# Patient Record
Sex: Female | Born: 1950 | Race: Black or African American | Hispanic: No | Smoking: Current every day smoker
Health system: Southern US, Community
[De-identification: ages and names within clinical notes are randomized; demographics above are authoritative.]

## PROBLEM LIST (undated history)

## (undated) DIAGNOSIS — I509 Heart failure, unspecified: Secondary | ICD-10-CM

## (undated) DIAGNOSIS — N951 Menopausal and female climacteric states: Secondary | ICD-10-CM

## (undated) DIAGNOSIS — R209 Unspecified disturbances of skin sensation: Secondary | ICD-10-CM

## (undated) DIAGNOSIS — K760 Fatty (change of) liver, not elsewhere classified: Secondary | ICD-10-CM

## (undated) DIAGNOSIS — I10 Essential (primary) hypertension: Secondary | ICD-10-CM

## (undated) DIAGNOSIS — T7840XA Allergy, unspecified, initial encounter: Secondary | ICD-10-CM

## (undated) DIAGNOSIS — J309 Allergic rhinitis, unspecified: Secondary | ICD-10-CM

## (undated) DIAGNOSIS — E785 Hyperlipidemia, unspecified: Secondary | ICD-10-CM

## (undated) DIAGNOSIS — Z0000 Encounter for general adult medical examination without abnormal findings: Secondary | ICD-10-CM

## (undated) DIAGNOSIS — K219 Gastro-esophageal reflux disease without esophagitis: Secondary | ICD-10-CM

## (undated) DIAGNOSIS — L989 Disorder of the skin and subcutaneous tissue, unspecified: Secondary | ICD-10-CM

## (undated) DIAGNOSIS — K649 Unspecified hemorrhoids: Secondary | ICD-10-CM

## (undated) DIAGNOSIS — Z124 Encounter for screening for malignant neoplasm of cervix: Secondary | ICD-10-CM

## (undated) DIAGNOSIS — I471 Supraventricular tachycardia, unspecified: Secondary | ICD-10-CM

## (undated) DIAGNOSIS — F419 Anxiety disorder, unspecified: Secondary | ICD-10-CM

## (undated) DIAGNOSIS — K635 Polyp of colon: Secondary | ICD-10-CM

## (undated) DIAGNOSIS — R531 Weakness: Secondary | ICD-10-CM

## (undated) DIAGNOSIS — R002 Palpitations: Secondary | ICD-10-CM

## (undated) DIAGNOSIS — E119 Type 2 diabetes mellitus without complications: Secondary | ICD-10-CM

## (undated) DIAGNOSIS — Z720 Tobacco use: Secondary | ICD-10-CM

## (undated) DIAGNOSIS — J45909 Unspecified asthma, uncomplicated: Secondary | ICD-10-CM

## (undated) DIAGNOSIS — G4700 Insomnia, unspecified: Secondary | ICD-10-CM

## (undated) HISTORY — DX: Allergy, unspecified, initial encounter: T78.40XA

## (undated) HISTORY — DX: Encounter for general adult medical examination without abnormal findings: Z00.00

## (undated) HISTORY — DX: Gastro-esophageal reflux disease without esophagitis: K21.9

## (undated) HISTORY — DX: Polyp of colon: K63.5

## (undated) HISTORY — PX: CATARACT EXTRACTION, BILATERAL: SHX1313

## (undated) HISTORY — DX: Anxiety disorder, unspecified: F41.9

## (undated) HISTORY — DX: Supraventricular tachycardia, unspecified: I47.10

## (undated) HISTORY — DX: Weakness: R53.1

## (undated) HISTORY — DX: Palpitations: R00.2

## (undated) HISTORY — PX: OTHER SURGICAL HISTORY: SHX169

## (undated) HISTORY — DX: Fatty (change of) liver, not elsewhere classified: K76.0

## (undated) HISTORY — PX: APPENDECTOMY: SHX54

## (undated) HISTORY — DX: Insomnia, unspecified: G47.00

## (undated) HISTORY — DX: Tobacco use: Z72.0

## (undated) HISTORY — DX: Menopausal and female climacteric states: N95.1

## (undated) HISTORY — DX: Allergic rhinitis, unspecified: J30.9

## (undated) HISTORY — DX: Hyperlipidemia, unspecified: E78.5

## (undated) HISTORY — DX: Type 2 diabetes mellitus without complications: E11.9

## (undated) HISTORY — DX: Unspecified hemorrhoids: K64.9

## (undated) HISTORY — DX: Disorder of the skin and subcutaneous tissue, unspecified: L98.9

## (undated) HISTORY — DX: Unspecified disturbances of skin sensation: R20.9

## (undated) HISTORY — DX: Encounter for screening for malignant neoplasm of cervix: Z12.4

## (undated) HISTORY — DX: Essential (primary) hypertension: I10

## (undated) HISTORY — DX: Supraventricular tachycardia: I47.1

---

## 1988-01-13 HISTORY — PX: ABDOMINAL HYSTERECTOMY: SHX81

## 1997-06-21 ENCOUNTER — Emergency Department (HOSPITAL_COMMUNITY): Admission: EM | Admit: 1997-06-21 | Discharge: 1997-06-21 | Payer: Self-pay | Admitting: Emergency Medicine

## 1997-12-31 ENCOUNTER — Ambulatory Visit (HOSPITAL_COMMUNITY): Admission: RE | Admit: 1997-12-31 | Discharge: 1997-12-31 | Payer: Self-pay | Admitting: Family Medicine

## 1998-08-03 ENCOUNTER — Emergency Department (HOSPITAL_COMMUNITY): Admission: EM | Admit: 1998-08-03 | Discharge: 1998-08-03 | Payer: Self-pay | Admitting: Emergency Medicine

## 1998-10-23 ENCOUNTER — Other Ambulatory Visit: Admission: RE | Admit: 1998-10-23 | Discharge: 1998-10-23 | Payer: Self-pay | Admitting: *Deleted

## 1998-10-23 ENCOUNTER — Other Ambulatory Visit: Admission: RE | Admit: 1998-10-23 | Discharge: 1998-10-23 | Payer: Self-pay | Admitting: Radiology

## 2000-05-13 ENCOUNTER — Other Ambulatory Visit: Admission: RE | Admit: 2000-05-13 | Discharge: 2000-05-13 | Payer: Self-pay | Admitting: *Deleted

## 2000-05-26 ENCOUNTER — Encounter: Payer: Self-pay | Admitting: Emergency Medicine

## 2000-05-26 ENCOUNTER — Emergency Department (HOSPITAL_COMMUNITY): Admission: EM | Admit: 2000-05-26 | Discharge: 2000-05-26 | Payer: Self-pay | Admitting: Emergency Medicine

## 2000-08-12 ENCOUNTER — Emergency Department (HOSPITAL_COMMUNITY): Admission: EM | Admit: 2000-08-12 | Discharge: 2000-08-12 | Payer: Self-pay

## 2000-08-18 ENCOUNTER — Encounter: Payer: Self-pay | Admitting: Internal Medicine

## 2000-08-18 ENCOUNTER — Ambulatory Visit (HOSPITAL_COMMUNITY): Admission: RE | Admit: 2000-08-18 | Discharge: 2000-08-18 | Payer: Self-pay | Admitting: Internal Medicine

## 2000-10-18 ENCOUNTER — Emergency Department (HOSPITAL_COMMUNITY): Admission: EM | Admit: 2000-10-18 | Discharge: 2000-10-19 | Payer: Self-pay | Admitting: Emergency Medicine

## 2000-10-19 ENCOUNTER — Encounter: Payer: Self-pay | Admitting: Emergency Medicine

## 2001-08-18 ENCOUNTER — Emergency Department (HOSPITAL_COMMUNITY): Admission: EM | Admit: 2001-08-18 | Discharge: 2001-08-18 | Payer: Self-pay | Admitting: Emergency Medicine

## 2002-01-12 HISTORY — PX: SIGMOIDOSCOPY: SUR1295

## 2002-01-15 ENCOUNTER — Emergency Department (HOSPITAL_COMMUNITY): Admission: EM | Admit: 2002-01-15 | Discharge: 2002-01-15 | Payer: Self-pay | Admitting: Emergency Medicine

## 2002-05-11 ENCOUNTER — Encounter: Payer: Self-pay | Admitting: Emergency Medicine

## 2002-05-11 ENCOUNTER — Emergency Department (HOSPITAL_COMMUNITY): Admission: EM | Admit: 2002-05-11 | Discharge: 2002-05-12 | Payer: Self-pay | Admitting: Emergency Medicine

## 2002-05-15 ENCOUNTER — Emergency Department (HOSPITAL_COMMUNITY): Admission: EM | Admit: 2002-05-15 | Discharge: 2002-05-15 | Payer: Self-pay | Admitting: Emergency Medicine

## 2002-05-15 ENCOUNTER — Encounter: Payer: Self-pay | Admitting: Emergency Medicine

## 2002-05-17 ENCOUNTER — Ambulatory Visit (HOSPITAL_COMMUNITY): Admission: RE | Admit: 2002-05-17 | Discharge: 2002-05-17 | Payer: Self-pay | Admitting: Internal Medicine

## 2002-06-23 ENCOUNTER — Encounter: Admission: RE | Admit: 2002-06-23 | Discharge: 2002-06-23 | Payer: Self-pay | Admitting: Internal Medicine

## 2002-06-23 ENCOUNTER — Encounter: Payer: Self-pay | Admitting: Internal Medicine

## 2002-06-25 ENCOUNTER — Emergency Department (HOSPITAL_COMMUNITY): Admission: EM | Admit: 2002-06-25 | Discharge: 2002-06-25 | Payer: Self-pay | Admitting: Emergency Medicine

## 2002-08-14 ENCOUNTER — Emergency Department (HOSPITAL_COMMUNITY): Admission: EM | Admit: 2002-08-14 | Discharge: 2002-08-14 | Payer: Self-pay | Admitting: Emergency Medicine

## 2003-07-31 ENCOUNTER — Emergency Department (HOSPITAL_COMMUNITY): Admission: EM | Admit: 2003-07-31 | Discharge: 2003-07-31 | Payer: Self-pay | Admitting: Emergency Medicine

## 2003-11-17 ENCOUNTER — Ambulatory Visit: Payer: Self-pay | Admitting: Family Medicine

## 2004-01-02 ENCOUNTER — Ambulatory Visit: Payer: Self-pay | Admitting: Internal Medicine

## 2004-03-24 ENCOUNTER — Ambulatory Visit: Payer: Self-pay | Admitting: Internal Medicine

## 2004-07-21 ENCOUNTER — Ambulatory Visit: Payer: Self-pay | Admitting: Internal Medicine

## 2004-07-28 ENCOUNTER — Ambulatory Visit: Payer: Self-pay | Admitting: Internal Medicine

## 2004-08-28 ENCOUNTER — Ambulatory Visit: Payer: Self-pay | Admitting: Internal Medicine

## 2004-09-22 ENCOUNTER — Ambulatory Visit: Payer: Self-pay | Admitting: Internal Medicine

## 2004-10-01 ENCOUNTER — Ambulatory Visit: Payer: Self-pay

## 2004-12-01 ENCOUNTER — Ambulatory Visit: Payer: Self-pay | Admitting: Internal Medicine

## 2005-02-12 ENCOUNTER — Ambulatory Visit: Payer: Self-pay | Admitting: Internal Medicine

## 2005-05-18 ENCOUNTER — Ambulatory Visit: Payer: Self-pay | Admitting: Internal Medicine

## 2005-07-02 ENCOUNTER — Ambulatory Visit: Payer: Self-pay | Admitting: Internal Medicine

## 2005-07-24 ENCOUNTER — Ambulatory Visit: Payer: Self-pay | Admitting: Internal Medicine

## 2005-07-31 ENCOUNTER — Ambulatory Visit: Payer: Self-pay | Admitting: Internal Medicine

## 2005-09-09 ENCOUNTER — Ambulatory Visit: Payer: Self-pay | Admitting: Internal Medicine

## 2005-11-16 ENCOUNTER — Encounter: Payer: Self-pay | Admitting: Internal Medicine

## 2005-11-25 ENCOUNTER — Ambulatory Visit: Payer: Self-pay | Admitting: Internal Medicine

## 2006-02-08 ENCOUNTER — Ambulatory Visit: Payer: Self-pay | Admitting: Internal Medicine

## 2006-04-19 ENCOUNTER — Ambulatory Visit: Payer: Self-pay | Admitting: Internal Medicine

## 2006-07-21 DIAGNOSIS — K219 Gastro-esophageal reflux disease without esophagitis: Secondary | ICD-10-CM | POA: Insufficient documentation

## 2006-07-21 DIAGNOSIS — J309 Allergic rhinitis, unspecified: Secondary | ICD-10-CM | POA: Insufficient documentation

## 2006-07-21 DIAGNOSIS — N951 Menopausal and female climacteric states: Secondary | ICD-10-CM | POA: Insufficient documentation

## 2006-08-05 ENCOUNTER — Ambulatory Visit: Payer: Self-pay | Admitting: Internal Medicine

## 2006-08-05 LAB — CONVERTED CEMR LAB
ALT: 25 units/L (ref 0–35)
AST: 25 units/L (ref 0–37)
Albumin: 3.8 g/dL (ref 3.5–5.2)
Basophils Absolute: 0.1 10*3/uL (ref 0.0–0.1)
Bilirubin Urine: NEGATIVE
Bilirubin, Direct: 0.1 mg/dL (ref 0.0–0.3)
CO2: 31 meq/L (ref 19–32)
Creatinine, Ser: 0.5 mg/dL (ref 0.4–1.2)
Eosinophils Absolute: 0.2 10*3/uL (ref 0.0–0.6)
GFR calc Af Amer: 165 mL/min
GFR calc non Af Amer: 137 mL/min
Glucose, Urine, Semiquant: NEGATIVE
HDL: 36.6 mg/dL — ABNORMAL LOW (ref >39.0)
Lymphocytes Relative: 48.4 % — ABNORMAL HIGH (ref 12.0–46.0)
MCV: 95.4 fL (ref 78.0–100.0)
Monocytes Absolute: 0.5 10*3/uL (ref 0.2–0.7)
Monocytes Relative: 6.6 % (ref 3.0–11.0)
Potassium: 3.7 meq/L (ref 3.5–5.1)
RBC: 4.13 M/uL (ref 3.87–5.11)
Sodium: 143 meq/L (ref 135–145)
Total Bilirubin: 0.8 mg/dL (ref 0.3–1.2)
Total Protein: 6.5 g/dL (ref 6.0–8.3)
Triglycerides: 116 mg/dL (ref 0–149)
Urobilinogen, UA: 1
VLDL: 23 mg/dL (ref 0–40)
WBC Urine, dipstick: NEGATIVE
pH: 6

## 2006-08-12 ENCOUNTER — Ambulatory Visit: Payer: Self-pay | Admitting: Internal Medicine

## 2006-10-01 ENCOUNTER — Ambulatory Visit: Payer: Self-pay | Admitting: Internal Medicine

## 2006-12-31 ENCOUNTER — Ambulatory Visit: Payer: Self-pay | Admitting: Internal Medicine

## 2007-01-03 ENCOUNTER — Telehealth (INDEPENDENT_AMBULATORY_CARE_PROVIDER_SITE_OTHER): Payer: Self-pay | Admitting: *Deleted

## 2007-01-13 HISTORY — PX: COLONOSCOPY: SHX174

## 2007-02-04 ENCOUNTER — Ambulatory Visit: Payer: Self-pay | Admitting: Internal Medicine

## 2007-02-04 ENCOUNTER — Ambulatory Visit: Payer: Self-pay | Admitting: Cardiology

## 2007-02-04 ENCOUNTER — Inpatient Hospital Stay (HOSPITAL_COMMUNITY): Admission: EM | Admit: 2007-02-04 | Discharge: 2007-02-05 | Payer: Self-pay | Admitting: Emergency Medicine

## 2007-02-12 ENCOUNTER — Emergency Department (HOSPITAL_COMMUNITY): Admission: EM | Admit: 2007-02-12 | Discharge: 2007-02-12 | Payer: Self-pay | Admitting: Emergency Medicine

## 2007-02-17 ENCOUNTER — Ambulatory Visit: Payer: Self-pay | Admitting: Internal Medicine

## 2007-02-17 DIAGNOSIS — I700 Atherosclerosis of aorta: Secondary | ICD-10-CM | POA: Insufficient documentation

## 2007-02-17 LAB — CONVERTED CEMR LAB: VLDL: 31 mg/dL (ref 0–40)

## 2007-02-24 ENCOUNTER — Emergency Department (HOSPITAL_COMMUNITY): Admission: EM | Admit: 2007-02-24 | Discharge: 2007-02-24 | Payer: Self-pay | Admitting: Emergency Medicine

## 2007-03-03 ENCOUNTER — Ambulatory Visit: Payer: Self-pay | Admitting: Internal Medicine

## 2007-03-21 ENCOUNTER — Telehealth: Payer: Self-pay | Admitting: Internal Medicine

## 2007-04-21 ENCOUNTER — Telehealth: Payer: Self-pay | Admitting: Internal Medicine

## 2007-04-29 ENCOUNTER — Ambulatory Visit: Payer: Self-pay | Admitting: Internal Medicine

## 2007-05-06 ENCOUNTER — Ambulatory Visit: Payer: Self-pay | Admitting: Family Medicine

## 2007-05-10 ENCOUNTER — Ambulatory Visit: Payer: Self-pay | Admitting: Internal Medicine

## 2007-05-10 ENCOUNTER — Encounter: Payer: Self-pay | Admitting: Internal Medicine

## 2007-05-11 ENCOUNTER — Telehealth: Payer: Self-pay | Admitting: Internal Medicine

## 2007-05-31 ENCOUNTER — Ambulatory Visit: Payer: Self-pay | Admitting: Family Medicine

## 2007-06-08 ENCOUNTER — Emergency Department (HOSPITAL_COMMUNITY): Admission: EM | Admit: 2007-06-08 | Discharge: 2007-06-08 | Payer: Self-pay | Admitting: Emergency Medicine

## 2007-06-13 ENCOUNTER — Telehealth: Payer: Self-pay | Admitting: Internal Medicine

## 2007-07-04 ENCOUNTER — Ambulatory Visit: Payer: Self-pay | Admitting: Internal Medicine

## 2007-07-18 ENCOUNTER — Ambulatory Visit: Payer: Self-pay | Admitting: Internal Medicine

## 2007-07-18 LAB — CONVERTED CEMR LAB
BUN: 6 mg/dL (ref 6–23)
Basophils Relative: 1.1 % — ABNORMAL HIGH (ref 0.0–1.0)
Bilirubin, Direct: 0.1 mg/dL (ref 0.0–0.3)
Calcium: 9.3 mg/dL (ref 8.4–10.5)
Creatinine, Ser: 0.6 mg/dL (ref 0.4–1.2)
Eosinophils Absolute: 0.1 10*3/uL (ref 0.0–0.7)
Eosinophils Relative: 1.2 % (ref 0.0–5.0)
GFR calc Af Amer: 133 mL/min
Glucose, Bld: 134 mg/dL — ABNORMAL HIGH (ref 70–99)
Lymphocytes Relative: 57.8 % — ABNORMAL HIGH (ref 12.0–46.0)
MCHC: 34.1 g/dL (ref 30.0–36.0)
MCV: 96.1 fL (ref 78.0–100.0)
Monocytes Relative: 6.3 % (ref 3.0–12.0)
Neutro Abs: 2.4 10*3/uL (ref 1.4–7.7)
Neutrophils Relative %: 33.6 % — ABNORMAL LOW (ref 43.0–77.0)
Platelets: 281 10*3/uL (ref 150–400)
RBC: 4.43 M/uL (ref 3.87–5.11)
RDW: 13.1 % (ref 11.5–14.6)
Total CHOL/HDL Ratio: 5.5
WBC: 7.2 10*3/uL (ref 4.5–10.5)

## 2007-07-21 ENCOUNTER — Ambulatory Visit: Payer: Self-pay | Admitting: Internal Medicine

## 2007-07-24 ENCOUNTER — Emergency Department (HOSPITAL_COMMUNITY): Admission: EM | Admit: 2007-07-24 | Discharge: 2007-07-24 | Payer: Self-pay | Admitting: Emergency Medicine

## 2007-08-01 ENCOUNTER — Emergency Department (HOSPITAL_COMMUNITY): Admission: EM | Admit: 2007-08-01 | Discharge: 2007-08-01 | Payer: Self-pay | Admitting: Emergency Medicine

## 2007-08-16 ENCOUNTER — Telehealth: Payer: Self-pay | Admitting: Internal Medicine

## 2007-08-16 ENCOUNTER — Emergency Department (HOSPITAL_COMMUNITY): Admission: EM | Admit: 2007-08-16 | Discharge: 2007-08-16 | Payer: Self-pay | Admitting: Emergency Medicine

## 2007-08-18 ENCOUNTER — Ambulatory Visit: Payer: Self-pay | Admitting: Internal Medicine

## 2007-08-18 DIAGNOSIS — E1149 Type 2 diabetes mellitus with other diabetic neurological complication: Secondary | ICD-10-CM | POA: Insufficient documentation

## 2007-08-18 DIAGNOSIS — Z8679 Personal history of other diseases of the circulatory system: Secondary | ICD-10-CM | POA: Insufficient documentation

## 2007-08-18 DIAGNOSIS — E119 Type 2 diabetes mellitus without complications: Secondary | ICD-10-CM | POA: Insufficient documentation

## 2007-08-19 ENCOUNTER — Telehealth: Payer: Self-pay | Admitting: Internal Medicine

## 2007-08-22 ENCOUNTER — Telehealth: Payer: Self-pay | Admitting: *Deleted

## 2007-08-30 ENCOUNTER — Ambulatory Visit: Payer: Self-pay | Admitting: Internal Medicine

## 2007-08-31 ENCOUNTER — Encounter: Payer: Self-pay | Admitting: Internal Medicine

## 2007-09-07 ENCOUNTER — Telehealth: Payer: Self-pay | Admitting: Internal Medicine

## 2007-09-07 ENCOUNTER — Encounter: Admission: RE | Admit: 2007-09-07 | Discharge: 2007-12-06 | Payer: Self-pay | Admitting: Internal Medicine

## 2007-09-08 ENCOUNTER — Encounter: Payer: Self-pay | Admitting: Internal Medicine

## 2007-09-09 ENCOUNTER — Ambulatory Visit: Payer: Self-pay | Admitting: Internal Medicine

## 2007-09-09 LAB — CONVERTED CEMR LAB
Basophils Absolute: 0.1 10*3/uL (ref 0.0–0.1)
CO2: 29 meq/L (ref 19–32)
Calcium: 9.6 mg/dL (ref 8.4–10.5)
Chloride: 105 meq/L (ref 96–112)
Creatinine, Ser: 0.6 mg/dL (ref 0.4–1.2)
Eosinophils Absolute: 0.1 10*3/uL (ref 0.0–0.7)
GFR calc non Af Amer: 110 mL/min
HCT: 42 % (ref 36.0–46.0)
Hemoglobin: 14.8 g/dL (ref 12.0–15.0)
Lymphocytes Relative: 57.8 % — ABNORMAL HIGH (ref 12.0–46.0)
MCHC: 35.1 g/dL (ref 30.0–36.0)
Platelets: 295 10*3/uL (ref 150–400)
Potassium: 3.5 meq/L (ref 3.5–5.1)
Prothrombin Time: 12 s (ref 10.9–13.3)
RBC: 4.48 M/uL (ref 3.87–5.11)
Sodium: 140 meq/L (ref 135–145)
WBC: 7 10*3/uL (ref 4.5–10.5)
aPTT: 27.7 s (ref 21.7–29.8)

## 2007-09-13 HISTORY — PX: OTHER SURGICAL HISTORY: SHX169

## 2007-09-20 ENCOUNTER — Ambulatory Visit: Payer: Self-pay | Admitting: Cardiology

## 2007-09-20 ENCOUNTER — Emergency Department (HOSPITAL_COMMUNITY): Admission: EM | Admit: 2007-09-20 | Discharge: 2007-09-21 | Payer: Self-pay | Admitting: Emergency Medicine

## 2007-09-21 ENCOUNTER — Ambulatory Visit (HOSPITAL_COMMUNITY): Admission: RE | Admit: 2007-09-21 | Discharge: 2007-09-22 | Payer: Self-pay | Admitting: Internal Medicine

## 2007-09-27 ENCOUNTER — Ambulatory Visit: Payer: Self-pay

## 2007-09-27 ENCOUNTER — Emergency Department (HOSPITAL_COMMUNITY): Admission: EM | Admit: 2007-09-27 | Discharge: 2007-09-28 | Payer: Self-pay | Admitting: Emergency Medicine

## 2007-09-27 ENCOUNTER — Emergency Department (HOSPITAL_COMMUNITY): Admission: EM | Admit: 2007-09-27 | Discharge: 2007-09-27 | Payer: Self-pay | Admitting: Emergency Medicine

## 2007-09-27 ENCOUNTER — Encounter: Payer: Self-pay | Admitting: Internal Medicine

## 2007-09-28 ENCOUNTER — Telehealth: Payer: Self-pay | Admitting: Internal Medicine

## 2007-10-01 ENCOUNTER — Emergency Department: Payer: Self-pay | Admitting: Unknown Physician Specialty

## 2007-10-03 ENCOUNTER — Ambulatory Visit: Payer: Self-pay | Admitting: Internal Medicine

## 2007-10-03 DIAGNOSIS — F4310 Post-traumatic stress disorder, unspecified: Secondary | ICD-10-CM | POA: Insufficient documentation

## 2007-10-04 ENCOUNTER — Telehealth: Payer: Self-pay | Admitting: Internal Medicine

## 2007-10-06 ENCOUNTER — Encounter: Payer: Self-pay | Admitting: Family Medicine

## 2007-10-15 ENCOUNTER — Emergency Department (HOSPITAL_COMMUNITY): Admission: EM | Admit: 2007-10-15 | Discharge: 2007-10-15 | Payer: Self-pay | Admitting: Emergency Medicine

## 2007-10-18 ENCOUNTER — Ambulatory Visit: Payer: Self-pay | Admitting: Internal Medicine

## 2007-10-18 DIAGNOSIS — I10 Essential (primary) hypertension: Secondary | ICD-10-CM | POA: Insufficient documentation

## 2007-10-18 LAB — CONVERTED CEMR LAB: Hgb A1c MFr Bld: 6.6 % — ABNORMAL HIGH (ref 4.6–6.0)

## 2007-10-28 ENCOUNTER — Emergency Department (HOSPITAL_COMMUNITY): Admission: EM | Admit: 2007-10-28 | Discharge: 2007-10-28 | Payer: Self-pay | Admitting: Emergency Medicine

## 2007-11-02 ENCOUNTER — Ambulatory Visit: Payer: Self-pay | Admitting: Internal Medicine

## 2007-11-02 LAB — CONVERTED CEMR LAB
Chloride: 104 meq/L (ref 96–112)
GFR calc Af Amer: 112 mL/min
Sodium: 142 meq/L (ref 135–145)

## 2007-11-03 ENCOUNTER — Ambulatory Visit: Payer: Self-pay | Admitting: Family Medicine

## 2007-11-03 DIAGNOSIS — F17200 Nicotine dependence, unspecified, uncomplicated: Secondary | ICD-10-CM | POA: Insufficient documentation

## 2007-11-17 ENCOUNTER — Ambulatory Visit: Payer: Self-pay | Admitting: Internal Medicine

## 2007-11-29 ENCOUNTER — Emergency Department (HOSPITAL_COMMUNITY): Admission: EM | Admit: 2007-11-29 | Discharge: 2007-11-29 | Payer: Self-pay | Admitting: Family Medicine

## 2007-12-05 ENCOUNTER — Telehealth: Payer: Self-pay | Admitting: Internal Medicine

## 2007-12-05 ENCOUNTER — Ambulatory Visit: Payer: Self-pay | Admitting: Internal Medicine

## 2007-12-16 ENCOUNTER — Emergency Department (HOSPITAL_COMMUNITY): Admission: EM | Admit: 2007-12-16 | Discharge: 2007-12-16 | Payer: Self-pay | Admitting: Family Medicine

## 2007-12-23 ENCOUNTER — Ambulatory Visit: Payer: Self-pay | Admitting: Internal Medicine

## 2008-01-10 ENCOUNTER — Encounter: Payer: Self-pay | Admitting: Internal Medicine

## 2008-02-04 ENCOUNTER — Emergency Department (HOSPITAL_COMMUNITY): Admission: EM | Admit: 2008-02-04 | Discharge: 2008-02-04 | Payer: Self-pay | Admitting: Emergency Medicine

## 2008-02-08 ENCOUNTER — Telehealth: Payer: Self-pay | Admitting: *Deleted

## 2008-02-23 ENCOUNTER — Ambulatory Visit: Payer: Self-pay | Admitting: Family Medicine

## 2008-02-27 ENCOUNTER — Emergency Department (HOSPITAL_COMMUNITY): Admission: EM | Admit: 2008-02-27 | Discharge: 2008-02-27 | Payer: Self-pay | Admitting: Emergency Medicine

## 2008-03-01 ENCOUNTER — Encounter: Payer: Self-pay | Admitting: Family Medicine

## 2008-03-01 ENCOUNTER — Ambulatory Visit: Payer: Self-pay | Admitting: Family Medicine

## 2008-03-05 ENCOUNTER — Encounter: Payer: Self-pay | Admitting: Family Medicine

## 2008-03-05 LAB — CONVERTED CEMR LAB
BUN: 9 mg/dL (ref 6–23)
CO2: 25 meq/L (ref 19–32)
Glucose, Bld: 102 mg/dL — ABNORMAL HIGH (ref 70–99)
HCT: 41.4 % (ref 36.0–46.0)
MCHC: 34.8 g/dL (ref 30.0–36.0)
Sodium: 141 meq/L (ref 135–145)
Total CHOL/HDL Ratio: 5.2
Triglycerides: 138 mg/dL (ref <150)
VLDL: 28 mg/dL (ref 0–40)

## 2008-03-15 ENCOUNTER — Encounter: Payer: Self-pay | Admitting: Family Medicine

## 2008-03-15 ENCOUNTER — Ambulatory Visit: Payer: Self-pay | Admitting: Family Medicine

## 2008-03-15 LAB — CONVERTED CEMR LAB

## 2008-03-20 ENCOUNTER — Ambulatory Visit: Payer: Self-pay | Admitting: Family Medicine

## 2008-04-09 ENCOUNTER — Telehealth: Payer: Self-pay | Admitting: Internal Medicine

## 2008-04-09 ENCOUNTER — Encounter: Payer: Self-pay | Admitting: Internal Medicine

## 2008-04-10 ENCOUNTER — Telehealth: Payer: Self-pay | Admitting: Internal Medicine

## 2008-04-12 ENCOUNTER — Ambulatory Visit: Payer: Self-pay | Admitting: Family Medicine

## 2008-04-17 ENCOUNTER — Ambulatory Visit: Payer: Self-pay | Admitting: Family Medicine

## 2008-04-17 ENCOUNTER — Encounter: Payer: Self-pay | Admitting: Family Medicine

## 2008-04-21 ENCOUNTER — Emergency Department (HOSPITAL_COMMUNITY): Admission: EM | Admit: 2008-04-21 | Discharge: 2008-04-21 | Payer: Self-pay | Admitting: Emergency Medicine

## 2008-04-23 ENCOUNTER — Ambulatory Visit: Payer: Self-pay | Admitting: Family Medicine

## 2008-04-23 ENCOUNTER — Encounter: Payer: Self-pay | Admitting: Family Medicine

## 2008-04-23 LAB — CONVERTED CEMR LAB
Cholesterol: 217 mg/dL — ABNORMAL HIGH (ref 0–200)
LDL Cholesterol: 150 mg/dL — ABNORMAL HIGH (ref 0–99)
Triglycerides: 151 mg/dL — ABNORMAL HIGH (ref <150)
VLDL: 30 mg/dL (ref 0–40)

## 2008-04-25 ENCOUNTER — Encounter: Payer: Self-pay | Admitting: Family Medicine

## 2008-04-29 ENCOUNTER — Emergency Department (HOSPITAL_COMMUNITY): Admission: EM | Admit: 2008-04-29 | Discharge: 2008-04-29 | Payer: Self-pay | Admitting: Emergency Medicine

## 2008-05-03 ENCOUNTER — Ambulatory Visit: Payer: Self-pay | Admitting: Family Medicine

## 2008-05-07 ENCOUNTER — Encounter: Payer: Self-pay | Admitting: Family Medicine

## 2008-05-08 ENCOUNTER — Telehealth: Payer: Self-pay | Admitting: *Deleted

## 2008-05-10 ENCOUNTER — Telehealth: Payer: Self-pay | Admitting: Family Medicine

## 2008-05-14 ENCOUNTER — Telehealth: Payer: Self-pay | Admitting: Family Medicine

## 2008-05-17 ENCOUNTER — Ambulatory Visit: Payer: Self-pay | Admitting: Family Medicine

## 2008-05-17 ENCOUNTER — Encounter: Payer: Self-pay | Admitting: Family Medicine

## 2008-05-17 LAB — CONVERTED CEMR LAB
ALT: 15 units/L (ref 0–35)
AST: 15 units/L (ref 0–37)
Albumin: 4.3 g/dL (ref 3.5–5.2)
Alkaline Phosphatase: 93 units/L (ref 39–117)
BUN: 11 mg/dL (ref 6–23)
CO2: 24 meq/L (ref 19–32)
Calcium: 9.4 mg/dL (ref 8.4–10.5)
Chloride: 106 meq/L (ref 96–112)
Creatinine, Ser: 0.66 mg/dL (ref 0.40–1.20)
Glucose, Bld: 146 mg/dL — ABNORMAL HIGH (ref 70–99)
Potassium: 3.6 meq/L (ref 3.5–5.3)
Sodium: 141 meq/L (ref 135–145)
Total Bilirubin: 0.5 mg/dL (ref 0.3–1.2)
Total Protein: 6.7 g/dL (ref 6.0–8.3)

## 2008-05-25 ENCOUNTER — Encounter: Payer: Self-pay | Admitting: Family Medicine

## 2008-05-26 ENCOUNTER — Telehealth: Payer: Self-pay | Admitting: Family Medicine

## 2008-05-28 ENCOUNTER — Emergency Department (HOSPITAL_COMMUNITY): Admission: EM | Admit: 2008-05-28 | Discharge: 2008-05-28 | Payer: Self-pay | Admitting: Emergency Medicine

## 2008-05-31 ENCOUNTER — Ambulatory Visit: Payer: Self-pay | Admitting: Family Medicine

## 2008-05-31 LAB — CONVERTED CEMR LAB: Hgb A1c MFr Bld: 6.4 %

## 2008-06-13 ENCOUNTER — Telehealth: Payer: Self-pay | Admitting: Internal Medicine

## 2008-06-14 ENCOUNTER — Ambulatory Visit: Payer: Self-pay | Admitting: Family Medicine

## 2008-06-19 ENCOUNTER — Encounter: Payer: Self-pay | Admitting: Family Medicine

## 2008-06-26 ENCOUNTER — Encounter: Payer: Self-pay | Admitting: Family Medicine

## 2008-06-27 ENCOUNTER — Ambulatory Visit: Payer: Self-pay | Admitting: Family Medicine

## 2008-07-12 ENCOUNTER — Ambulatory Visit: Payer: Self-pay | Admitting: Family Medicine

## 2008-07-13 ENCOUNTER — Encounter: Payer: Self-pay | Admitting: Family Medicine

## 2008-07-13 ENCOUNTER — Ambulatory Visit: Payer: Self-pay | Admitting: Internal Medicine

## 2008-07-17 ENCOUNTER — Telehealth: Payer: Self-pay | Admitting: *Deleted

## 2008-07-18 ENCOUNTER — Ambulatory Visit: Payer: Self-pay | Admitting: Family Medicine

## 2008-07-18 ENCOUNTER — Encounter: Payer: Self-pay | Admitting: Family Medicine

## 2008-07-18 LAB — CONVERTED CEMR LAB: TSH: 0.515 microintl units/mL (ref 0.350–4.500)

## 2008-07-19 ENCOUNTER — Encounter: Payer: Self-pay | Admitting: Family Medicine

## 2008-08-01 ENCOUNTER — Encounter: Payer: Self-pay | Admitting: Family Medicine

## 2008-08-03 ENCOUNTER — Ambulatory Visit: Payer: Self-pay | Admitting: Family Medicine

## 2008-08-03 ENCOUNTER — Encounter: Payer: Self-pay | Admitting: Family Medicine

## 2008-08-16 ENCOUNTER — Ambulatory Visit: Payer: Self-pay | Admitting: Family Medicine

## 2008-08-16 DIAGNOSIS — E663 Overweight: Secondary | ICD-10-CM | POA: Insufficient documentation

## 2008-08-21 ENCOUNTER — Encounter: Payer: Self-pay | Admitting: Family Medicine

## 2008-09-03 ENCOUNTER — Telehealth (INDEPENDENT_AMBULATORY_CARE_PROVIDER_SITE_OTHER): Payer: Self-pay | Admitting: *Deleted

## 2008-09-11 ENCOUNTER — Encounter: Payer: Self-pay | Admitting: Family Medicine

## 2008-10-12 ENCOUNTER — Ambulatory Visit: Payer: Self-pay | Admitting: Family Medicine

## 2008-10-12 ENCOUNTER — Encounter: Payer: Self-pay | Admitting: Family Medicine

## 2008-10-12 LAB — CONVERTED CEMR LAB: Hgb A1c MFr Bld: 6.3 %

## 2008-10-15 LAB — CONVERTED CEMR LAB: Direct LDL: 74 mg/dL

## 2008-10-30 ENCOUNTER — Telehealth: Payer: Self-pay | Admitting: Family Medicine

## 2008-11-29 ENCOUNTER — Ambulatory Visit: Payer: Self-pay | Admitting: Family Medicine

## 2008-12-13 ENCOUNTER — Encounter: Payer: Self-pay | Admitting: Family Medicine

## 2008-12-13 ENCOUNTER — Ambulatory Visit (HOSPITAL_COMMUNITY): Admission: RE | Admit: 2008-12-13 | Discharge: 2008-12-13 | Payer: Self-pay | Admitting: Family Medicine

## 2008-12-24 ENCOUNTER — Emergency Department (HOSPITAL_COMMUNITY): Admission: EM | Admit: 2008-12-24 | Discharge: 2008-12-24 | Payer: Self-pay | Admitting: Emergency Medicine

## 2008-12-25 ENCOUNTER — Ambulatory Visit: Payer: Self-pay | Admitting: Family Medicine

## 2009-01-05 ENCOUNTER — Ambulatory Visit: Payer: Self-pay | Admitting: Family Medicine

## 2009-01-05 ENCOUNTER — Encounter: Payer: Self-pay | Admitting: Family Medicine

## 2009-01-05 ENCOUNTER — Inpatient Hospital Stay (HOSPITAL_COMMUNITY): Admission: EM | Admit: 2009-01-05 | Discharge: 2009-01-06 | Payer: Self-pay | Admitting: Emergency Medicine

## 2009-01-24 ENCOUNTER — Ambulatory Visit: Payer: Self-pay | Admitting: Family Medicine

## 2009-04-15 ENCOUNTER — Emergency Department (HOSPITAL_COMMUNITY): Admission: EM | Admit: 2009-04-15 | Discharge: 2009-04-15 | Payer: Self-pay | Admitting: Emergency Medicine

## 2009-05-02 ENCOUNTER — Encounter: Payer: Self-pay | Admitting: Family Medicine

## 2009-05-17 ENCOUNTER — Emergency Department (HOSPITAL_COMMUNITY): Admission: EM | Admit: 2009-05-17 | Discharge: 2009-05-17 | Payer: Self-pay | Admitting: Emergency Medicine

## 2009-05-24 ENCOUNTER — Encounter: Payer: Self-pay | Admitting: Family Medicine

## 2009-05-29 ENCOUNTER — Ambulatory Visit: Payer: Self-pay | Admitting: Family Medicine

## 2009-06-03 ENCOUNTER — Ambulatory Visit: Payer: Self-pay | Admitting: Family Medicine

## 2009-06-03 LAB — CONVERTED CEMR LAB
AST: 15 units/L (ref 0–37)
Alkaline Phosphatase: 91 units/L (ref 39–117)
BUN: 14 mg/dL (ref 6–23)
Glucose, Bld: 115 mg/dL — ABNORMAL HIGH (ref 70–99)
HDL: 43 mg/dL (ref >39)
LDL Cholesterol: 87 mg/dL (ref 0–99)
Potassium: 3.8 meq/L (ref 3.5–5.3)
Sodium: 142 meq/L (ref 135–145)
Total Bilirubin: 0.4 mg/dL (ref 0.3–1.2)
Triglycerides: 104 mg/dL (ref <150)
VLDL: 21 mg/dL (ref 0–40)

## 2009-06-04 ENCOUNTER — Encounter: Payer: Self-pay | Admitting: Family Medicine

## 2009-06-05 ENCOUNTER — Telehealth (INDEPENDENT_AMBULATORY_CARE_PROVIDER_SITE_OTHER): Payer: Self-pay | Admitting: *Deleted

## 2009-06-06 ENCOUNTER — Telehealth: Payer: Self-pay | Admitting: Internal Medicine

## 2009-06-11 ENCOUNTER — Ambulatory Visit: Payer: Self-pay | Admitting: Internal Medicine

## 2009-06-13 ENCOUNTER — Telehealth (INDEPENDENT_AMBULATORY_CARE_PROVIDER_SITE_OTHER): Payer: Self-pay | Admitting: *Deleted

## 2009-06-17 ENCOUNTER — Encounter (HOSPITAL_COMMUNITY): Admission: RE | Admit: 2009-06-17 | Discharge: 2009-08-13 | Payer: Self-pay | Admitting: Internal Medicine

## 2009-06-17 ENCOUNTER — Encounter: Payer: Self-pay | Admitting: Internal Medicine

## 2009-06-17 ENCOUNTER — Ambulatory Visit: Payer: Self-pay

## 2009-06-17 ENCOUNTER — Ambulatory Visit: Payer: Self-pay | Admitting: Internal Medicine

## 2009-07-01 ENCOUNTER — Ambulatory Visit: Payer: Self-pay | Admitting: Family Medicine

## 2009-07-01 ENCOUNTER — Encounter: Payer: Self-pay | Admitting: Family Medicine

## 2009-07-16 ENCOUNTER — Telehealth: Payer: Self-pay | Admitting: *Deleted

## 2009-07-17 ENCOUNTER — Telehealth: Payer: Self-pay | Admitting: *Deleted

## 2009-07-25 ENCOUNTER — Encounter: Payer: Self-pay | Admitting: Family Medicine

## 2009-07-26 ENCOUNTER — Ambulatory Visit: Payer: Self-pay | Admitting: Family Medicine

## 2009-08-26 ENCOUNTER — Telehealth: Payer: Self-pay | Admitting: Family Medicine

## 2009-08-27 ENCOUNTER — Ambulatory Visit: Payer: Self-pay | Admitting: Family Medicine

## 2009-08-28 ENCOUNTER — Ambulatory Visit: Payer: Self-pay | Admitting: Family Medicine

## 2009-08-28 ENCOUNTER — Encounter: Payer: Self-pay | Admitting: Family Medicine

## 2009-09-04 ENCOUNTER — Ambulatory Visit: Payer: Self-pay | Admitting: Family Medicine

## 2009-09-04 LAB — CONVERTED CEMR LAB
Hgb A1c MFr Bld: 6.6 %
TSH: 0.688 microintl units/mL (ref 0.350–4.500)

## 2009-09-26 ENCOUNTER — Encounter: Payer: Self-pay | Admitting: *Deleted

## 2009-10-18 ENCOUNTER — Telehealth: Payer: Self-pay | Admitting: Family Medicine

## 2009-10-19 ENCOUNTER — Emergency Department (HOSPITAL_COMMUNITY)
Admission: EM | Admit: 2009-10-19 | Discharge: 2009-10-19 | Payer: Self-pay | Source: Home / Self Care | Admitting: Emergency Medicine

## 2009-11-11 ENCOUNTER — Telehealth: Payer: Self-pay | Admitting: *Deleted

## 2009-11-14 ENCOUNTER — Telehealth: Payer: Self-pay | Admitting: Family Medicine

## 2009-12-02 ENCOUNTER — Ambulatory Visit: Payer: Self-pay | Admitting: Family Medicine

## 2009-12-02 LAB — CONVERTED CEMR LAB
BUN: 11 mg/dL (ref 6–23)
CO2: 30 meq/L (ref 19–32)
Calcium: 9.3 mg/dL (ref 8.4–10.5)
Chloride: 104 meq/L (ref 96–112)
Creatinine, Ser: 0.58 mg/dL (ref 0.40–1.20)
Glucose, Bld: 162 mg/dL — ABNORMAL HIGH (ref 70–99)

## 2009-12-03 ENCOUNTER — Encounter: Payer: Self-pay | Admitting: Family Medicine

## 2009-12-25 ENCOUNTER — Encounter: Payer: Self-pay | Admitting: Family Medicine

## 2010-01-09 ENCOUNTER — Emergency Department (HOSPITAL_COMMUNITY)
Admission: EM | Admit: 2010-01-09 | Discharge: 2010-01-09 | Payer: Self-pay | Source: Home / Self Care | Admitting: Family Medicine

## 2010-01-10 ENCOUNTER — Ambulatory Visit: Admit: 2010-01-10 | Payer: Self-pay

## 2010-01-31 ENCOUNTER — Ambulatory Visit
Admission: RE | Admit: 2010-01-31 | Discharge: 2010-01-31 | Payer: Self-pay | Source: Home / Self Care | Attending: Family Medicine | Admitting: Family Medicine

## 2010-02-13 NOTE — Miscellaneous (Signed)
Summary: walk in  Clinical Lists Changes c/o continuing dizziness. wants to be seen today. put with Dr. Gwendolyn Grant at 3pm. states she is unable to drive but will come back at 3.Golden Circle RN  August 28, 2009 11:53 AM

## 2010-02-13 NOTE — Assessment & Plan Note (Signed)
Summary: L sided numbness/[place]   Vital Signs:  Patient profile:   60 year old female Height:      64.25 inches O2 Sat:      99 % Temp:     98.2 degrees F oral Pulse rate:   86 / minute BP sitting:   134 / 85 CC: left sided numbness   Primary Care Provider:  Sarah Swaziland MD  CC:  left sided numbness.  History of Present Illness: [name] comes in today for paraesthesias.  STates it is on both sides of her body but feels "funnier" on the left.  Noticed it when she woke up this morning.  Felt normal when she went to bed last night.  Is from her head to her toes.  Has had similar sensations before but not as "strong" as today.  CAN feel - is not numb - just feels tingly and strange.  No motor symptosm - no trouble speaking, swallowing, moving arms or legs, or walking. No visual changes.  Has pinched nerve in her neck and neuropathy from diabetes.  Goes to PT.  Was going today but just wanted to "stop by on the way and make sure she wasn't having a stroke".   She does have DM - last A1C 6.1 and CBG this morning 112. Has HLD - last LDL 87 in May Has HTN - well controlled. Takes aspirin daily and follows closely with cardiology.   Allergies: 1)  ! Augmentin 2)  ! Penicillin  Physical Exam  General:  overweight, alert, NAD, comfortable vitals reviewed Eyes:  pupils equal, pupils round, pupils reactive to light, corneas and lenses clear, and no injection.   Ears:  External ear exam shows no significant lesions or deformities.  Otoscopic examination reveals clear canals, tympanic membranes are intact bilaterally without bulging, retraction, inflammation or discharge. Hearing is grossly normal bilaterally. Mouth:  pharynx pink and moist.   Lungs:  Normal respiratory effort, chest expands symmetrically. Lungs are clear to auscultation, no crackles or wheezes. Heart:  Normal rate and regular rhythm. S1 and S2 normal without gallop, murmur, click, rub or other extra sounds. Neurologic:   alert & oriented X3, cranial nerves II-XII intact, strength normal in all extremities, sensation intact to light touch, sensation intact to pinprick, and gait normal.   Psych:  Oriented X3, memory intact for recent and remote, normally interactive, good eye contact, not anxious appearing, and not depressed appearing.     Impression & Recommendations:  Problem # 1:  PARESTHESIA (ICD-782.0)  Not true numbness - is paraesthesias.  No motor symptoms.  Co-morbidities under good control.  Precepted with Dr. Deirdre Priest.  Extremely unlikely she is having a stroke with these symptosm.  Discussed warning signs of stroke to seek medical attention right away (facial droop, weakness on one side, difficulty speaking).  No indication to send for acute work-up unless symptoms change.   Orders: [facility]- Est  Level 4 (14782)  Complete Medication List: 1)  Daily Multiple Vitamins Tabs (Multiple vitamin) .... Take 1 tablet by mouth once a day 2)  Calcium and Vitamin D  .... Take 1 tablet by mouth once a day 3)  Nasonex 50 Mcg/act Susp (Mometasone furoate) .... Two spray each side daily 4)  Allergy Shots  .... Weekly 5)  Lorazepam 0.5 Mg Tabs (Lorazepam) .... Take 1 tablet by mouth two times a day 6)  Carafate 1 Gm/34ml Susp (Sucralfate) .... One three times a day as needed 7)  Tramadol Hcl 50 Mg  Tabs (Tramadol hcl) .... One three times a day 8)  Propranolol Hcl 10 Mg Tabs (Propranolol hcl) .... One every 6 hours for elevated heart rate 9)  Metformin Hcl 500 Mg Xr24h-tab (Metformin hcl) .... 2 daily 10)  Bd Ultra-fine Lancets Misc (Lancets) .... Use daily 11)  Baby Aspirin 81 Mg Chew (Aspirin) .... One daily 12)  B-complex/b-12 Tabs (B complex vitamins) 13)  Diovan 80 Mg Tabs (Valsartan) .Marland Kitchen.. 1 by mouth daily for blood pressure.  take instead of cozaar) 14)  Gabapentin 300 Mg Caps (Gabapentin) .... Increase to 2 at night for 3 days, then 2 in the morning and 2 at night for 3 days, and then 2 three times a day. 15)   Simvastatin 20 Mg Tabs (Simvastatin) .... Take 1 by mouth every night for cholesterol.  take instead of lipitor 16)  Losartan Potassium 25 Mg Tabs (Losartan potassium) .... Take 1 by mouth daily for blood pressure. 17)  Truetrack Test Strp (Glucose blood) .... Use as directed 18)  Omeprazole 40 Mg Cpdr (Omeprazole) .... Take 1 daily for stomach

## 2010-02-13 NOTE — Progress Notes (Signed)
Summary: phn msg  Phone Note Call from Patient Call back at Home Phone [phone]   Caller: Patient Summary of Call: Cardiologist -Dr Ladona Ridgel is very hard to get into and they can't get her in until July 5th wants to know if there is someone else she can see. Initial call taken by: De Nurse,  Jun 05, 2009 2:43 PM  Follow-up for Phone Call        to PCP Follow-up by: Gladstone Pih,  Jun 05, 2009 4:19 PM  Additional Follow-up for Phone Call Additional follow up Details #1::        I did an actual referral.  If Dr. Ladona Ridgel is not available, could someone else in the practice see her? Additional Follow-up by: Sarah Swaziland MD,  Jun 05, 2009 10:41 PM    Additional Follow-up for Phone Call Additional follow up Details #2::    Explained to Dr Swaziland need to call and talk with nurse or Doctor to see if we can move apt up, per their office.  Dr Swaziland will call pt and make decision Follow-up by: Gladstone Pih,  Jun 06, 2009 10:33 AM

## 2010-02-13 NOTE — Letter (Signed)
Summary: Generic Letter  [facility]  [address]   [place], [place] [address]   Phone: [phone]  Fax: [phone]    12/03/2009  [facility] [address] [place], [place]  [address]  Dear Ms. Rufus,  I am happy to let you know that your lab work was normal.  I hope you have a happy Thanksgiving!   Sincerely,   Sarah Swaziland MD  Appended Document: Generic Letter letter mailed

## 2010-02-13 NOTE — Miscellaneous (Signed)
Summary: diabetic shoes  Clinical Lists Changes faxed the order back to them.Golden Circle RN  September 26, 2009 4:59 PM

## 2010-02-13 NOTE — Miscellaneous (Signed)
Summary: ROI  ROI   Imported By: Clydell Hakim 09/05/2009 11:26:58  _____________________________________________________________________  External Attachment:    Type:   Image     Comment:   External Document

## 2010-02-13 NOTE — Assessment & Plan Note (Signed)
Summary: Abrasion on abdomen   Vital Signs:  Patient profile:   60 year old female Height:      64.25 inches Weight:      193 pounds BMI:     32.99 Temp:     98.2 degrees F Pulse rate:   103 / minute BP sitting:   137 / 79  (left arm) Cuff size:   large  Vitals Entered By: Tessie Fass CMA (July 26, 2009 8:36 AM) Is Patient Diabetic? Yes Pain Assessment Patient in pain? no        Primary Provider:  Sarah Swaziland MD   History of Present Illness: Torn area on stomach-  Pt. states skin tore when drying off after shower yesterday.  No bleeding at that time.  Tender to touch.  For treatment she applied cornstarch last night.  Has had no drainage from the area.  Denies fever, chills.  Allergies: 1)  ! Augmentin 2)  ! Penicillin  Review of Systems       Pertinent positives and negatives noted in HPI, Vitals signs noted   Physical Exam  General:  alert, well-developed, and overweight aaf   Skin:  Small (2-3cm) abrasion on lower right abdomen.  No surrounding edema or warmth.  No drainage or bleeding.   Impression & Recommendations:  Problem # 1:  ABRASION, TRUNK (ICD-911.0) Small abrasion, does not look infected.   Recommended treatment with antibiotic ointment and cover with a band-aid.  Advised her not to use cornstarch because it can encourage yeast growth, can use talcum or baby powder to keep area under abdomen dry. Orders: [facility]- Est Level  3 (04540)  Complete Medication List: 1)  Daily Multiple Vitamins Tabs (Multiple vitamin) .... Take 1 tablet by mouth once a day 2)  Calcium and Vitamin D  .... Take 1 tablet by mouth once a day 3)  Nasonex 50 Mcg/act Susp (Mometasone furoate) .... Two spray each side daily 4)  Allergy Shots  .... Weekly 5)  Lorazepam 0.5 Mg Tabs (Lorazepam) .... Take 1 tablet by mouth two times a day 6)  Carafate 1 Gm/66ml Susp (Sucralfate) .... One three times a day as needed 7)  Tramadol Hcl 50 Mg Tabs (Tramadol hcl) .... One three times a  day 8)  Propranolol Hcl 10 Mg Tabs (Propranolol hcl) .... One every 6 hours for elevated heart rate 9)  Metformin Hcl 500 Mg Xr24h-tab (Metformin hcl) .... 2 daily 10)  Bd Ultra-fine Lancets Misc (Lancets) .... Use daily 11)  Baby Aspirin 81 Mg Chew (Aspirin) .... One daily 12)  B-complex/b-12 Tabs (B complex vitamins) 13)  Diovan 80 Mg Tabs (Valsartan) .Marland Kitchen.. 1 by mouth daily for blood pressure.  take instead of cozaar) 14)  Simvastatin 20 Mg Tabs (Simvastatin) .... Take 1 by mouth every night for cholesterol.  take instead of lipitor 15)  Losartan Potassium 25 Mg Tabs (Losartan potassium) .... Take 1 by mouth daily for blood pressure. 16)  Truetrack Test Strp (Glucose blood) .... Use as directed 17)  Omeprazole 40 Mg Cpdr (Omeprazole) .... Take 1 daily for stomach 18)  Venlafaxine Hcl 75 Mg Xr24h-cap (Venlafaxine hcl) .... Take 1 daily for hot flashes.  Patient Instructions: 1)  It was nice meeting you today 2)  The area on your stomach does not look infected 3)  You can use neosporin or triple antibiotic ointment on the area and cover with a band-aid 4)  To keep the area under your abdomen  dry you may use talcum  powder or baby powder 5)  If you have increased pain in the area, fever, or drainage from the sore please call back to be seen again.

## 2010-02-13 NOTE — Progress Notes (Signed)
Summary: triage  Phone Note Call from Patient Call back at Home Phone [phone]   Caller: Patient Summary of Call: Pt wanted to ask Kennon Rounds another question. Initial call taken by: Clydell Hakim,  July 17, 2009 4:16 PM  Follow-up for Phone Call        states the gaba did help her neuropathy. if she tapers off it what else can she do for this issue.  discussed her pain level. she may be interested in a referral to pain clinic if her md thinks that would help. pcp on vacation. sent this to Dr, Jennette Kettle who is on her same team Follow-up by: Golden Circle RN,  July 17, 2009 4:28 PM  Additional Follow-up for Phone Call Additional follow up Details #1::        I would stay on the gabapentin and discuss with Dr Swaziland when she gets back next week Thanks!  Denny Levy MD  July 18, 2009 8:41 AM     Additional Follow-up for Phone Call Additional follow up Details #2::    advised pt of above. told her I will call her with pcp answer next week Follow-up by: Golden Circle RN,  July 18, 2009 9:09 AM  Additional Follow-up for Phone Call Additional follow up Details #3:: Details for Additional Follow-up Action Taken: Agree.  She should stay on gabapentin for pain.  If her pain is not controlled on her current dose, we can titrate up.  If this does not get her pain to a manageable level, then we can consider pain clinic referral. Additional Follow-up by: Sarah Swaziland MD,  July 22, 2009 8:40 AM  LM.Marland KitchenGolden Circle RN  July 22, 2009 9:51 AM   Returning call .[name]  July 22, 2009 2:11 PM   told her to stay on it since it helped per md. let us know if the pain worsens, as the dose can be increased if needed. she agreed with this.Golden Circle RN  July 22, 2009 3:07 PM

## 2010-02-13 NOTE — Assessment & Plan Note (Signed)
Summary: hot flashes/[place]   Vital Signs:  Patient profile:   60 year old female Height:      64.25 inches Weight:      192.1 pounds BMI:     32.84 Temp:     98.3 degrees F oral Pulse rate:   81 / minute BP sitting:   124 / 76  (left arm) Cuff size:   large  Vitals Entered By: Gladstone Pih (Jun 03, 2009 8:50 AM) CC: C/O Hot Flashes getting worse Is Patient Diabetic? Yes Did you bring your meter with you today? No Pain Assessment Patient in pain? no        Primary Care Provider:  Sarah Swaziland MD  CC:  C/O Hot Flashes getting worse.  History of Present Illness: PT c/io hpt flashes for years, but getting worse.  Now water pours off of her, and she reports "night drownings".  Yesterday, she felt like she was going to fall out.  Heart started ounding, sweating a lot, sat down, continued sweating.  Got better after she got home and laid down.  FElt like she had gotten out of the shower because she was drenched in sweat.  + very mild dizziness.  no chest pain, or SOB.  HOt flashes occur several times a day, and if it's hot outside, she feels like she will pass out.  \par  Plans to quit smoking on her birthday.  Not interested in classes.  Habits & Providers  Alcohol-Tobacco-Diet     Tobacco Status: current     Tobacco Counseling: to quit use of tobacco products     Cigarette Packs/Day: 0.5  Allergies: 1)  ! Augmentin 2)  ! Penicillin  Review of Systems       see HPI  Physical Exam  General:  Well-developed,well-nourished,in no acute distress; alert,appropriate and cooperative throughout examination. Vitals noted Lungs:  Normal respiratory effort, chest expands symmetrically. Lungs are clear to auscultation, no crackles or wheezes. Heart:  Normal rate and regular rhythm. S1 and S2 normal without gallop, murmur, click, rub or other extra sounds.   Impression & Recommendations:  Problem # 1:  MENOPAUSAL SYNDROME (ICD-627.2) Episodes are very likely hot flushes.  Pt  does have  cardiac risk factors of HTN, Hyperlipidemia, smoking, DM2 and +FH, but had a neg nuclear stress test in September, 2009.  Pt has had recent admission for r/o MI, chest pain thought to be due to reflux.  Given multiple risk factors, will refer to cards for risk stratification.   Increase gabapentin for symptom control.  F/u if no relief.  Problem # 2:  TOBACCO ABUSE (ICD-305.1)  Pt has quit day planned.  Declines group class.  Orders: Cardiology Referral (Cardiology)  Complete Medication List: 1)  Daily Multiple Vitamins Tabs (Multiple vitamin) .... Take 1 tablet by mouth once a day 2)  Calcium and Vitamin D  .... Take 1 tablet by mouth once a day 3)  Nasonex 50 Mcg/act Susp (Mometasone furoate) .... Two spray each side daily 4)  Allergy Shots  .... Weekly 5)  Lorazepam 0.5 Mg Tabs (Lorazepam) .... Take 1 tablet by mouth two times a day 6)  Carafate 1 Gm/65ml Susp (Sucralfate) .... One three times a day as needed 7)  Tramadol Hcl 50 Mg Tabs (Tramadol hcl) .... One three times a day 8)  Propranolol Hcl 10 Mg Tabs (Propranolol hcl) .... One every 6 hours for elevated heart rate 9)  Metformin Hcl 500 Mg Xr24h-tab (Metformin hcl) .... 2 daily 10)  Bd Ultra-fine Lancets Misc (Lancets) .... Use daily 11)  Baby Aspirin 81 Mg Chew (Aspirin) .... One daily 12)  B-complex/b-12 Tabs (B complex vitamins) 13)  Diovan 80 Mg Tabs (Valsartan) .Marland Kitchen.. 1 by mouth daily for blood pressure.  take instead of cozaar) 14)  Gabapentin 300 Mg Caps (Gabapentin) .... Increase to 2 at night for 3 days, then 2 in the morning and 2 at night for 3 days, and then 2 three times a day. 15)  Simvastatin 20 Mg Tabs (Simvastatin) .... Take 1 by mouth every night for cholesterol.  take instead of lipitor 16)  Losartan Potassium 25 Mg Tabs (Losartan potassium) .... Take 1 by mouth daily for blood pressure. 17)  Truetrack Test Strp (Glucose blood) .... Use as directed 18)  Omeprazole 40 Mg Cpdr (Omeprazole) .... Take 1  daily for stomach  Patient Instructions: 1)  WE will make an appt with Dr. Ladona Ridgel to talk about the episodes you are having.  If they get worse or if you have pain with them, please call us or go to the ER.   2)  Please consider stopping smoking.  IT's great you plan to quit on your birthday. Prescriptions: GABAPENTIN 300 MG CAPS (GABAPENTIN) Increase to 2 at night for 3 days, then 2 in the morning and 2 at night for 3 days, and then 2 three times a day.  #180 x 3   Entered and Authorized by:   Sarah Swaziland MD   Signed by:   Sarah Swaziland MD on 06/03/2009   Method used:   Electronically to        [facility] [address] [phone]* (retail)       [address]       [place], [place]  [address]       Ph: [phone] or [phone]       Fax: [phone]   RxID:   [id number]

## 2010-02-13 NOTE — Miscellaneous (Signed)
Summary: med change for cost reasons  Clinical Lists Changes  Medications: Added new medication of DIOVAN 80 MG TABS (VALSARTAN) 1 by mouth daily for blood pressure.  Take instead of Cozaar) - Signed Removed medication of LOSARTAN POTASSIUM 50 MG TABS (LOSARTAN POTASSIUM) Take one daily. Rx of DIOVAN 80 MG TABS (VALSARTAN) 1 by mouth daily for blood pressure.  Take instead of Cozaar);  #90 x 0;  Signed;  Entered by: Sarah Swaziland MD;  Authorized by: Sarah Swaziland MD;  Method used: Faxed to [facility] [place], , [place], [place]  , Ph: , Fax: [phone]    Prescriptions: DIOVAN 80 MG TABS (VALSARTAN) 1 by mouth daily for blood pressure.  Take instead of Cozaar)  #90 x 0   Entered and Authorized by:   Sarah Swaziland MD   Signed by:   Sarah Swaziland MD on 05/02/2009   Method used:   Faxed to ...       [facility] (retail)             [place], [place]         Ph:        Fax: [phone]   RxID:   [id number]

## 2010-02-13 NOTE — Miscellaneous (Signed)
Summary: walk in  Clinical Lists Changes walked in c/o L sided numbness. states she has a pinched nerve in neck. vs obtained. pain is "10/10". all this started when she woke up. placed in work in schedule.Golden Circle RN  July 01, 2009 10:16 AM

## 2010-02-13 NOTE — Progress Notes (Signed)
Summary: meds prob  Phone Note Call from Patient Call back at Home Phone [phone]   Caller: Patient Summary of Call: needs to talk to nurse about BP meds Initial call taken by: De Nurse,  August 26, 2009 11:37 AM  Follow-up for Phone Call        started her losartin & states it makes her dizzy & "off" takes at night.  she finished all of the Cozaar she had so as not to waste it. has not yet bought the diovan  wants to know if she should be seen for the dizziness & if she should start the diovan. told her will likely need to be seen. will ask pc-p & call her back Follow-up by: Golden Circle RN,  August 26, 2009 11:42 AM  Additional Follow-up for Phone Call Additional follow up Details #1::        The losartan is the generic for the Cozaar, so they should have similar side effects.  If she is feeling dizzy and this is new for her, she should probably be evaluated.  I am concerned it is not a medication side effect if the only new thing is a switch to Cozaar from losartan Additional Follow-up by: Sarah Swaziland MD,  August 26, 2009 12:40 PM    Additional Follow-up for Phone Call Additional follow up Details #2::    she will come tomorrow am & see Dr. Earnest Bailey Follow-up by: Golden Circle RN,  August 26, 2009 2:25 PM

## 2010-02-13 NOTE — Assessment & Plan Note (Signed)
Summary: bp meds & dizziness/Joshua/jordan   Vital Signs:  Patient profile:   60 year old female Weight:      197.1 pounds Temp:     97.8 degrees F oral Pulse rate:   110 / minute Pulse rhythm:   regular BP sitting:   134 / 85  (left arm) Cuff size:   large  Vitals Entered By: Loralee Pacas CMA (August 27, 2009 11:12 AM)  Primary Care Provider:  Sarah Swaziland MD   History of Present Illness: 60 yo here for workin-in appt for dizziness over the weekend.  She thought she had started a new medicine (losartan) on Friday.  Notices some dizziness on saturday and sunday.  Stopped medicine sunday and felt fine monday and today.  Dizziness is described as "not all there" but no vertigo, presyncope, chest pain, palpitations, dyspnea, weakness, numbness, or dysarthria.  Otherwise she says she was in her usual state of health.  She took her blood pressure over the weekend and it was not low.  Reviewed chart with patient- she has been on losartan for some time now but she knew it as Cozaar.  In May her cardiologist recommended she be on diovan instead for increased BP control but the patient said she was nto aware and never picked it up.  Current Medications (verified): 1)  Daily Multiple Vitamins   Tabs (Multiple Vitamin) .... Take 1 Tablet By Mouth Once A Day 2)  Calcium and Vitamin D .... Take 1 Tablet By Mouth Once A Day 3)  Nasonex 50 Mcg/act  Susp (Mometasone Furoate) .... Two Spray Each Side Daily 4)  Allergy Shots .... Weekly 5)  Lorazepam 0.5 Mg  Tabs (Lorazepam) .... Take 1 Tablet By Mouth Two Times A Day 6)  Carafate 1 Gm/54ml  Susp (Sucralfate) .... One Three Times A Day As Needed 7)  Tramadol Hcl 50 Mg  Tabs (Tramadol Hcl) .... One Three Times A Day 8)  Propranolol Hcl 10 Mg  Tabs (Propranolol Hcl) .... One Every 6 Hours For Elevated Heart Rate 9)  Metformin Hcl 500 Mg  Xr24h-Tab (Metformin Hcl) .... 2 Daily 10)  Bd Ultra-Fine Lancets  Misc (Lancets) .... Use Daily 11)  Baby Aspirin 81  Mg Chew (Aspirin) .... One Daily 12)  B-Complex/b-12  Tabs (B Complex Vitamins) 13)  Diovan 80 Mg Tabs (Valsartan) .Marland Kitchen.. 1 By Mouth Daily For Blood Pressure.  Take Instead of Cozaar) 14)  Simvastatin 20 Mg Tabs (Simvastatin) .... Take 1 By Mouth Every Night For Cholesterol.  Take Instead of Lipitor 15)  Losartan Potassium 25 Mg Tabs (Losartan Potassium) .... Take 1 By Mouth Daily For Blood Pressure. 16)  Truetrack Test  Strp (Glucose Blood) .... Use As Directed 17)  Omeprazole 40 Mg Cpdr (Omeprazole) .... Take 1 Daily For Stomach 18)  Venlafaxine Hcl 75 Mg Xr24h-Cap (Venlafaxine Hcl) .... Take 1 Daily For Hot Flashes.  Allergies: 1)  ! Augmentin 2)  ! Penicillin PMH-FH-SH reviewed for relevance  Review of Systems      See HPI  Physical Exam  General:  alert, well-developed, and overweight aaf   Lungs:  Normal respiratory effort, chest expands symmetrically. Lungs are clear to auscultation, no crackles or wheezes. Heart:  Normal rate and regular rhythm. S1 and S2 normal without gallop, murmur, click, rub or other extra sounds. Neurologic:  alert & oriented X3, cranial nerves II-XII intact, strength normal in all extremities, sensation intact to light touch, and gait normal.     Impression & Recommendations:  Problem # 1:  DIZZINESS (ICD-780.4)  does not seem to be true dizziness and I doubt it is due to cozaar as patient has been on this for some time.  No evidence of vertigo, hypotension, cardiac, or neurological etiology.  Advised another trial of cozaar to see if she can duplicate symptoms.  Likely just episodic coincidence.  Has f/u with Dr. Swaziland next week and will disucss further at that time if it recurs.  Orders: [facility]- Est Level  3 (56213)  Problem # 2:  ESSENTIAL HYPERTENSION, BENIGN (ICD-401.1)  Slightly above goal of 130/80 today while off cozaar.  Advised to trial cozaar again. If nto able to tolerate wi ll  need additional BP agent.    Her updated medication list for  this problem includes:    Propranolol Hcl 10 Mg Tabs (Propranolol hcl) ..... One every 6 hours for elevated heart rate    Diovan 80 Mg Tabs (Valsartan) .Marland Kitchen... 1 by mouth daily for blood pressure.  take instead of cozaar)    Losartan Potassium 25 Mg Tabs (Losartan potassium) .Marland Kitchen... Take 1 by mouth daily for blood pressure.  Orders: [facility]- Est Level  3 (08657)  Complete Medication List: 1)  Daily Multiple Vitamins Tabs (Multiple vitamin) .... Take 1 tablet by mouth once a day 2)  Calcium and Vitamin D  .... Take 1 tablet by mouth once a day 3)  Nasonex 50 Mcg/act Susp (Mometasone furoate) .... Two spray each side daily 4)  Allergy Shots  .... Weekly 5)  Lorazepam 0.5 Mg Tabs (Lorazepam) .... Take 1 tablet by mouth two times a day 6)  Carafate 1 Gm/64ml Susp (Sucralfate) .... One three times a day as needed 7)  Tramadol Hcl 50 Mg Tabs (Tramadol hcl) .... One three times a day 8)  Propranolol Hcl 10 Mg Tabs (Propranolol hcl) .... One every 6 hours for elevated heart rate 9)  Metformin Hcl 500 Mg Xr24h-tab (Metformin hcl) .... 2 daily 10)  Bd Ultra-fine Lancets Misc (Lancets) .... Use daily 11)  Baby Aspirin 81 Mg Chew (Aspirin) .... One daily 12)  B-complex/b-12 Tabs (B complex vitamins) 13)  Diovan 80 Mg Tabs (Valsartan) .Marland Kitchen.. 1 by mouth daily for blood pressure.  take instead of cozaar) 14)  Simvastatin 20 Mg Tabs (Simvastatin) .... Take 1 by mouth every night for cholesterol.  take instead of lipitor 15)  Losartan Potassium 25 Mg Tabs (Losartan potassium) .... Take 1 by mouth daily for blood pressure. 16)  Truetrack Test Strp (Glucose blood) .... Use as directed 17)  Omeprazole 40 Mg Cpdr (Omeprazole) .... Take 1 daily for stomach 18)  Venlafaxine Hcl 75 Mg Xr24h-cap (Venlafaxine hcl) .... Take 1 daily for hot flashes.  Patient Instructions: 1)  try cozaar (losartan) again and see if it makes you feel the same way.   2)  Follow-up with Dr. Swaziland next week   Prevention & Chronic  Care Immunizations   Influenza vaccine: Fluvax 3+  (10/03/2007)   Influenza vaccine due: 10/02/2008    Tetanus booster: 03/15/2008: given   Tetanus booster due: 03/16/2018    Pneumococcal vaccine: Historical  (10/12/2001)   Pneumococcal vaccine due: None  Colorectal Screening   Hemoccult: not indicated  (03/16/2008)   Hemoccult due: Not Indicated    Colonoscopy: hyperplastic polyp  (05/10/2007)   Colonoscopy due: 05/09/2017  Other Screening   Pap smear: Not documented   Pap smear due: Not Indicated    Mammogram: No specific mammographic evidence of malignancy.  Assessment: BIRADS 1.  Location: [facility].     (12/13/2008)   Mammogram due: 12/13/2009   Smoking status: current  (06/03/2009)   Smoking cessation counseling: yes  (03/15/2008)  Diabetes Mellitus   HgbA1C: 6.1  (05/29/2009)   Hemoglobin A1C due: 04/12/2009    Eye exam: No diabetic retinopathy.   Per report, "pressures okay"  (08/21/2008)   Eye exam due: 08/2009    Foot exam: yes  (03/15/2008)   High risk foot: Not documented   Foot care education: Not documented   Foot exam due: 03/15/2009    Urine microalbumin/creatinine ratio: Not documented   Urine microalbumin/cr due: 03/15/2009  Lipids   Total Cholesterol: 151  (06/03/2009)   Lipid panel action/deferral: LDL Direct Ordered   LDL: 87  (06/03/2009)   LDL Direct: 74  (10/12/2008)   HDL: 43  (06/03/2009)   Triglycerides: 104  (06/03/2009)   Lipid panel due: 04/24/2009    SGOT (AST): 15  (06/03/2009)   SGPT (ALT): 16  (06/03/2009)   Alkaline phosphatase: 91  (06/03/2009)   Total bilirubin: 0.4  (06/03/2009)  Hypertension   Last Blood Pressure: 134 / 85  (08/27/2009)   Serum creatinine: 0.54  (06/03/2009)   Serum potassium 3.8  (06/03/2009)   Basic metabolic panel due: 05/17/2009    Hypertension flowsheet reviewed?: Yes   Progress toward BP goal: Unchanged  Self-Management Support :   Personal Goals (by the next  clinic visit) :     Personal A1C goal: 7  (10/12/2008)     Personal blood pressure goal: 130/80  (10/12/2008)     Personal LDL goal: 100  (10/12/2008)    Diabetes self-management support: Written self-care plan, Education handout  (10/12/2008)    Diabetes self-management support not done because: Good outcomes  (12/25/2008)    Hypertension self-management support: BP self-monitoring log, Written self-care plan  (01/24/2009)    Lipid self-management support: Lipid monitoring log, Education handout, Written self-care plan  (10/12/2008)     Lipid self-management support not done because: Good outcomes  (01/24/2009)

## 2010-02-13 NOTE — Progress Notes (Signed)
Summary: phone call  Phone Note Call from Patient   Caller: Patient Call For: [phone] Summary of Call: [name] calling to say the rx for Venlafaxine makes her feel strange.   Want to know she need to continue to have something else. Initial call taken by: Abundio Miu,  November 11, 2009 10:33 AM  Follow-up for Phone Call        The venlaxafine is to help with hot flashes.  According to our records, she was first prescribed this in July.  Is there a change in how she feels on it?  If she would like to stop taking it, either because it is not helping the hot flashes or because she does not like how it feels, she may certainly stop it, but I am wondering if something else may be causing this feeling if she really has been on the venlaxafine since July.  Additional Follow-up for Phone Call Additional follow up Details #1::        LVM FOR PT TO CALL BACK Additional Follow-up by: Loralee Pacas CMA,  November 11, 2009 2:38 PM    Additional Follow-up for Phone Call Additional follow up Details #2::    spoke with pt and she informed me that she has only taken 1 (one) pill.  told her that dr. Swaziland said that she can stop taking the meds if they are bothering her.  pt has appt 11.21.2011 to see dr. Swaziland but if need she can try to come in sooner.   Also while on the phone with the pt she told me that she spilled hot coffee on her leg. i told her to use cold compresses on her leg and do not use any ointment and that if she began to blister to call us back she agreed Follow-up by: Loralee Pacas CMA,  November 12, 2009 12:25 PM

## 2010-02-13 NOTE — Assessment & Plan Note (Signed)
Summary: still dizzy/Waimea./jordan   Vital Signs:  Patient profile:   60 year old female Height:      64.25 inches Weight:      197 pounds BMI:     33.67 O2 Sat:      100 % Temp:     98.8 degrees F oral Pulse rate:   88 / minute BP sitting:   145 / 79  (left arm)  Vitals Entered By: Jimmy Footman, CMA (August 28, 2009 3:19 PM) CC: dizzy x4 days Is Patient Diabetic? Yes   Primary Care Lacresia Darwish:  Sarah Swaziland MD  CC:  dizzy x4 days.  History of Present Illness: Patient still complains of "dizziness."  Difficulty in pinning down exactly what this means to this patient.  Does state she sometimes feels like room is spinning around her.  Endorses history of veritgo in 2000.  States worse when she stands, though sometimes comes on her when she is sitting still.  Dizziness started when she was switched to generic Losartan on Friday.  Persisted until Sunday, when she stopped medicine.  Seen [facility] at [facility] Tuesday, recommended to continue meds.  Dizziness recurred.    ROS:  no chest pain, palpiations, headaches, shortness of breath.    Current Problems (verified): 1)  Dizziness  (ICD-780.4) 2)  Abrasion, Trunk  (ICD-911.0) 3)  Paresthesia  (ICD-782.0) 4)  Chest Discomfort  (ICD-786.59) 5)  Essential Hypertension, Benign  (ICD-401.1) 6)  Dyslipidemia  (ICD-272.4) 7)  Diabetes Mellitus, Type II  (ICD-250.00) 8)  Tobacco Abuse  (ICD-305.1) 9)  Overweight  (ICD-278.02) 10)  Leg Pain, Bilateral  (ICD-729.5) 11)  Insomnia  (ICD-780.52) 12)  Adjustment Disorder With Depressed Mood  (ICD-309.0) 13)  Skin Lesion  (ICD-709.9) 14)  Weakness  (ICD-780.79) 15)  Restless Leg Syndrome  (ICD-333.94) 16)  Anxiety  (ICD-300.00) 17)  Palpitations, Hx of  (ICD-V12.50) 18)  Preventive Health Care  (ICD-V70.0) 19)  Facial Paresthesia, Left  (ICD-782.0) 20)  Menopausal Syndrome  (ICD-627.2) 21)  Gerd  (ICD-530.81) 22)  Allergic Rhinitis  (ICD-477.9) 23)  Screening For Malignant Neoplasm of The Cervix   (ICD-V76.2)  Current Medications (verified): 1)  Daily Multiple Vitamins   Tabs (Multiple Vitamin) .... Take 1 Tablet By Mouth Once A Day 2)  Calcium and Vitamin D .... Take 1 Tablet By Mouth Once A Day 3)  Nasonex 50 Mcg/act  Susp (Mometasone Furoate) .... Two Spray Each Side Daily 4)  Allergy Shots .... Weekly 5)  Lorazepam 0.5 Mg  Tabs (Lorazepam) .... Take 1 Tablet By Mouth Two Times A Day 6)  Carafate 1 Gm/16ml  Susp (Sucralfate) .... One Three Times A Day As Needed 7)  Tramadol Hcl 50 Mg  Tabs (Tramadol Hcl) .... One Three Times A Day 8)  Propranolol Hcl 10 Mg  Tabs (Propranolol Hcl) .... One Every 6 Hours For Elevated Heart Rate 9)  Metformin Hcl 500 Mg  Xr24h-Tab (Metformin Hcl) .... 2 Daily 10)  Bd Ultra-Fine Lancets  Misc (Lancets) .... Use Daily 11)  Baby Aspirin 81 Mg Chew (Aspirin) .... One Daily 12)  B-Complex/b-12  Tabs (B Complex Vitamins) 13)  Diovan 80 Mg Tabs (Valsartan) .Marland Kitchen.. 1 By Mouth Daily For Blood Pressure.  Take Instead of Cozaar) 14)  Simvastatin 20 Mg Tabs (Simvastatin) .... Take 1 By Mouth Every Night For Cholesterol.  Take Instead of Lipitor 15)  Losartan Potassium 25 Mg Tabs (Losartan Potassium) .... Take 1 By Mouth Daily For Blood Pressure. 16)  Truetrack Test  Strp (  Glucose Blood) .... Use As Directed 17)  Omeprazole 40 Mg Cpdr (Omeprazole) .... Take 1 Daily For Stomach 18)  Venlafaxine Hcl 75 Mg Xr24h-Cap (Venlafaxine Hcl) .... Take 1 Daily For Hot Flashes. 19)  Meclizine Hcl 12.5 Mg Tabs (Meclizine Hcl) .... Take 1 Pill If Having Symptoms.  May Repeat 12 Hours Later.  Allergies (verified): 1)  ! Augmentin 2)  ! Penicillin  Physical Exam  General:  Vital signs reviewed. Well-developed, well-nourished patient in NAD.  Awake and cooperative  Eyes:  vision grossly intact, pupils equal, pupils round, and pupils reactive to light.   Lungs:  clear to auscultation bilaterally without wheezing, rales, or rhonchi.  Normal work of breathing  Heart:  Regular  rate and rhythm without murmur, rub, or gallop.  Normal S1/S2  Neurologic:  CN II-XII intact.  No focal deficits.   Impression & Recommendations:  Problem # 1:  DIZZINESS (ICD-780.4) Assessment Deteriorated Patient does seem to have symptoms consistent with taking Losartan.  May be manufacture defect in this medication, or she may not tolerate something in this particular generic versus brand Cozaar.  Recomended she stay off Losartan for next week.  If dizziness recurs, she'll know it's probably not the Losartan causing it.  Gave her a prescription for Meclizine to fill if she needs it.  Does endorse remote history of veritgo, perhaps it is recurring.  To follow up next week.   Her updated medication list for this problem includes:    Meclizine Hcl 12.5 Mg Tabs (Meclizine hcl) .Marland Kitchen... Take 1 pill if having symptoms.  may repeat 12 hours later.  Orders: [facility]- Est Level  3 (04540)  Problem # 2:  ESSENTIAL HYPERTENSION, BENIGN (ICD-401.1) Assessment: Unchanged Patient not on any blood pressure medications currently.  Only takes propranolol as needed for elevated HR.  States she's not taking Diovan.  Wants to wait and see if dizziness recurs, talk with PCP before starting any other meds.  Recommended she start Diovan in meantime, but patient adamanent about waiting.  Follow with PCP next week.  Her updated medication list for this problem includes:    Propranolol Hcl 10 Mg Tabs (Propranolol hcl) ..... One every 6 hours for elevated heart rate    Diovan 80 Mg Tabs (Valsartan) .Marland Kitchen... 1 by mouth daily for blood pressure.  take instead of cozaar)    Losartan Potassium 25 Mg Tabs (Losartan potassium) .Marland Kitchen... Take 1 by mouth daily for blood pressure.  Complete Medication List: 1)  Daily Multiple Vitamins Tabs (Multiple vitamin) .... Take 1 tablet by mouth once a day 2)  Calcium and Vitamin D  .... Take 1 tablet by mouth once a day 3)  Nasonex 50 Mcg/act Susp (Mometasone furoate) .... Two spray each side  daily 4)  Allergy Shots  .... Weekly 5)  Lorazepam 0.5 Mg Tabs (Lorazepam) .... Take 1 tablet by mouth two times a day 6)  Carafate 1 Gm/69ml Susp (Sucralfate) .... One three times a day as needed 7)  Tramadol Hcl 50 Mg Tabs (Tramadol hcl) .... One three times a day 8)  Propranolol Hcl 10 Mg Tabs (Propranolol hcl) .... One every 6 hours for elevated heart rate 9)  Metformin Hcl 500 Mg Xr24h-tab (Metformin hcl) .... 2 daily 10)  Bd Ultra-fine Lancets Misc (Lancets) .... Use daily 11)  Baby Aspirin 81 Mg Chew (Aspirin) .... One daily 12)  B-complex/b-12 Tabs (B complex vitamins) 13)  Diovan 80 Mg Tabs (Valsartan) .Marland Kitchen.. 1 by mouth daily for blood pressure.  take instead of cozaar) 14)  Simvastatin 20 Mg Tabs (Simvastatin) .... Take 1 by mouth every night for cholesterol.  take instead of lipitor 15)  Losartan Potassium 25 Mg Tabs (Losartan potassium) .... Take 1 by mouth daily for blood pressure. 16)  Truetrack Test Strp (Glucose blood) .... Use as directed 17)  Omeprazole 40 Mg Cpdr (Omeprazole) .... Take 1 daily for stomach 18)  Venlafaxine Hcl 75 Mg Xr24h-cap (Venlafaxine hcl) .... Take 1 daily for hot flashes. 19)  Meclizine Hcl 12.5 Mg Tabs (Meclizine hcl) .... Take 1 pill if having symptoms.  may repeat 12 hours later.  Patient Instructions: 1)  Stop taking the Losartan and see how your dizziness goes. 2)  We will also give you a prescription for Meclizine today if the dizziness comes back.  See if that helps. 3)  Keep your appointment to see Dr. Swaziland for next week.  We don't want you to go too long without blood pressure medicine. Prescriptions: MECLIZINE HCL 12.5 MG TABS (MECLIZINE HCL) Take 1 pill if having symptoms.  May repeat 12 hours later.  #12 x 0   Entered and Authorized by:   Renold Don MD   Signed by:   Renold Don MD on 08/28/2009   Method used:   Print then Give to Patient   RxID:   [id number]

## 2010-02-13 NOTE — Progress Notes (Signed)
Summary: Nuclear Pre-Procedure  Phone Note Outgoing Call   Call placed by: Milana Na, EMT-P,  June 13, 2009 2:08 PM Summary of Call: Reviewed information on [facility] Information Sheet (see scanned document for further details).  Spoke with patient.     Nuclear Med Background Indications for Stress Test: Evaluation for Ischemia, Abnormal EKG   History: Ablation, Echo, Myocardial Perfusion Study  History Comments: '06 MPS and ECHO '09 Ablation SVT  Symptoms: Chest Tightness, Diaphoresis, Palpitations    Nuclear Pre-Procedure Cardiac Risk Factors: Hypertension, Lipids, NIDDM Height (in): 64.25  Nuclear Med Study Referring MD:  R.Yoo

## 2010-02-13 NOTE — Assessment & Plan Note (Signed)
Summary: CPE AND A1C/KH   Vital Signs:  Patient profile:   60 year old female Height:      64.25 inches Weight:      195.8 pounds BMI:     33.47 Temp:     99.0 degrees F oral Pulse rate:   108 / minute BP sitting:   128 / 88  (left arm) Cuff size:   regular  Vitals Entered By: Garen Grams LPN (September 04, 2009 8:58 AM) CC: cpe Is Patient Diabetic? Yes Did you bring your meter with you today? Yes   Primary Care Provider:  Sarah Swaziland MD  CC:  cpe.  History of Present Illness: Pt seen for dizziness several times.  Still with dizziness, but meclizine helps.  No provoking factors.  Wonders if it could be her sinuses.  Describes sensation as being unfocused.  Example is that door looks like it should be farther away than it is.  Has had vertigo before, but this is different.  No unbalanced or spinning sensation.  Last eye exam was this month. Has small cataact in R eye.  No change in prescription.  Has done 08/21/09 with Dr. Emily Filbert.  Would like thyroid checked.  Still having hot flashes.  Trying a pot of water under bed.  No help.   Would like to have pap.  Hysterectomy 20 years ago for bleeding.  No cancer.  would really like pap.  Needs refills on all meds.   Taking BP meds as prescribed.  No chest pain.  No HA.   Taking diabetes meds.  No problems.  Eating more than in the past.  Declines meeting with nutrition or diabetes education.   Still smoking.  10-12 cigs per day.  Declines intervention.  Wants and plans to quit.        Habits & Providers  Alcohol-Tobacco-Diet     Tobacco Status: current     Tobacco Counseling: to quit use of tobacco products     Cigarette Packs/Day: 0.5     Diet Comments: not following diabetic diet     Diet Counseling: to improve diet; diet is suboptimal  Current Medications (verified): 1)  Daily Multiple Vitamins   Tabs (Multiple Vitamin) .... Take 1 Tablet By Mouth Once A Day 2)  Calcium and Vitamin D .... Take 1 Tablet By Mouth Once A  Day 3)  Nasonex 50 Mcg/act  Susp (Mometasone Furoate) .... Two Spray Each Side Daily 4)  Allergy Shots .... Weekly 5)  Lorazepam 0.5 Mg  Tabs (Lorazepam) .... Take 1 Tablet By Mouth Two Times A Day 6)  Carafate 1 Gm/36ml  Susp (Sucralfate) .... One Three Times A Day As Needed 7)  Tramadol Hcl 50 Mg  Tabs (Tramadol Hcl) .... One Three Times A Day 8)  Propranolol Hcl 10 Mg  Tabs (Propranolol Hcl) .... One Every 6 Hours For Elevated Heart Rate 9)  Metformin Hcl 500 Mg  Xr24h-Tab (Metformin Hcl) .... 2 Daily 10)  Bd Ultra-Fine Lancets  Misc (Lancets) .... Use Daily 11)  Baby Aspirin 81 Mg Chew (Aspirin) .... One Daily 12)  B-Complex/b-12  Tabs (B Complex Vitamins) 13)  Simvastatin 20 Mg Tabs (Simvastatin) .... Take 1 By Mouth Every Night For Cholesterol.  Take Instead of Lipitor 14)  Losartan Potassium 25 Mg Tabs (Losartan Potassium) .... Take 1 By Mouth Daily For Blood Pressure. 15)  Truetrack Test  Strp (Glucose Blood) .... Use As Directed 16)  Meclizine Hcl 12.5 Mg Tabs (Meclizine Hcl) .... Take 1  Pill If Having Symptoms.  May Repeat 12 Hours Later. 17)  Pantoprazole Sodium 40 Mg Tbec (Pantoprazole Sodium) .Marland Kitchen.. 1 By Mouth Daily For Stomach 18)  True2go Blood Glucose W/device Kit (Blood Glucose Monitoring Suppl) .... Use As Directed  Allergies: 1)  ! Augmentin 2)  ! 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  The patient denies vision loss, chest pain, syncope, dyspnea on exertion, peripheral edema, abdominal pain, melena, hematochezia, incontinence, and genital sores.    Physical Exam  General:  Well-developed,well-nourished,in no acute distress; alert,appropriate and cooperative throughout examination Head:  Normocephalic and atraumatic without obvious abnormalities. No apparent alopecia or balding. Eyes:  No corneal or conjunctival inflammation noted. EOMI. Perrla. Vision grossly normal. Mouth:  Oral mucosa and oropharynx without lesions or exudates.  Teeth in good repair. Neck:  No deformities, masses, or tenderness noted. Lungs:  Normal respiratory effort, chest expands symmetrically. Lungs are clear to auscultation, no crackles or wheezes. Heart:  Normal rate and regular rhythm. S1 and S2 normal without gallop, murmur, click, rub or other extra sounds. Genitalia:  Normal introitus for age, no external lesions, scant vaginal discharge, mucosa pink and moist, no vaginall lesions, no vaginal atrophy, no friaility or hemorrhage, no adnexal masses or tenderness.  Cervix absent.  vaginal pap done Extremities:  BL feet without lesions.  + dry skin.  DP 2+ B Neurologic:  Strength 5/5 thorughout.  DTR's absent BLE (?due to  trouble relaxing legs), 2+BUE.  Nl cerebellar function.  Nl gait.     Impression & Recommendations:  Problem # 1:  PREVENTIVE HEALTH CARE (ICD-V70.0)  CPE today.  Pt very much wanted pap today, even though her hysterectomy was for benign causes.  Will check vaginal pap as none is documented and to be sure cervix is absent. Pt taking calcium.   See below for specific issues.    Orders: [facility] - Est  40-64 yrs ([id number])  Problem # 2:  DIZZINESS (ICD-780.4)  Pt better on meclizine, but unclear if this is really dizziness.  She feels symtptoms are tolerable and neuro exam is normal.  continue to follow.   Her updated medication  list for this problem includes:    Meclizine Hcl 12.5 Mg Tabs (Meclizine hcl) .Marland Kitchen... Take 1 pill if having symptoms.  may repeat 12 hours later.  Orders: [facility] - Est  40-64 yrs (60454)  Problem # 3:  ESSENTIAL HYPERTENSION, BENIGN (ICD-401.1) Pt doing well with losartan except for potential side effects.  Plans to continue on this med.  Check TSH today. The following medications were removed from the medication list:    Diovan 80 Mg Tabs (Valsartan) .Marland Kitchen... 1 by mouth daily for blood pressure.  take instead of cozaar) Her updated medication list for this problem includes:    Propranolol Hcl 10 Mg Tabs (Propranolol hcl) ..... One every 6 hours for elevated heart rate    Losartan Potassium 25 Mg Tabs (Losartan potassium) .Marland Kitchen... Take 1 by mouth daily for blood pressure.  Orders: TSH-[facility] ([id number]) [facility] - Est  40-64 yrs [phone])  Problem # 4:  DYSLIPIDEMIA (ICD-272.4)  Much improved on current dose of simvistatin.  Continue current regimen. Her updated medication list for this problem includes:    Simvastatin 20 Mg Tabs (Simvastatin) .Marland Kitchen... Take 1 by mouth every night for cholesterol.  take instead of lipitor  Orders: [facility] - Est  40-64 yrs (11914)  Problem # 5:  DIABETES MELLITUS, TYPE II (ICD-250.00) Worsening control, but pt feels it is due to dietary indiscretion.  Declines nutrition counseling, diabetes education.  She will try to manage on her own.  Follow up in 3 months. The following medications were removed from the medication list:    Diovan 80 Mg Tabs (Valsartan) .Marland Kitchen... 1 by mouth daily for blood pressure.  take instead of cozaar) Her updated medication list for this problem includes:    Metformin Hcl 500 Mg Xr24h-tab (Metformin hcl) .Marland Kitchen... 2 daily    Baby Aspirin 81 Mg Chew (Aspirin) ..... One daily    Losartan Potassium 25 Mg Tabs (Losartan potassium) .Marland Kitchen... Take 1 by mouth daily for blood pressure.  Orders: A1C-[facility] ([id number]) Glucose Cap-[facility] (62130) [facility] - Est  40-64 yrs  ([id number])  Problem # 6:  TOBACCO ABUSE (ICD-305.1)  Pt counseled re: cessation.  She declines resources for cessation.  Orders: [facility] - Est  40-64 yrs ([id number])  Complete Medication List: 1)  Daily Multiple Vitamins Tabs (Multiple vitamin) .... Take 1 tablet by mouth once a day 2)  Calcium and Vitamin D  .... Take 1 tablet by mouth once a day 3)  Nasonex 50 Mcg/act Susp (Mometasone furoate) .... Two spray each side daily 4)  Allergy Shots  .... Weekly 5)  Lorazepam 0.5 Mg Tabs (Lorazepam) .... Take 1 tablet by mouth two times a day 6)  Carafate 1 Gm/8ml Susp (Sucralfate) .... One three times a day as needed 7)  Tramadol Hcl 50 Mg Tabs (Tramadol hcl) .... One three times a day 8)  Propranolol Hcl 10 Mg Tabs (Propranolol hcl) .... One every 6 hours for elevated heart rate 9)  Metformin Hcl 500 Mg Xr24h-tab (Metformin hcl) .... 2 daily 10)  Bd Ultra-fine Lancets Misc (Lancets) .... Use daily 11)  Baby Aspirin 81 Mg Chew (Aspirin) .... One daily 12)  B-complex/b-12 Tabs (B complex vitamins) 13)  Simvastatin 20 Mg Tabs (Simvastatin) .... Take 1 by mouth every night for cholesterol.  take instead of lipitor 14)  Losartan Potassium 25 Mg Tabs (Losartan potassium) .... Take 1 by mouth daily for blood pressure. 15)  Truetrack Test Strp (Glucose blood) .... Use as directed 16)  Meclizine Hcl 12.5 Mg Tabs (Meclizine hcl) .... Take 1 pill if having symptoms.  may repeat 12 hours later. 17)  Pantoprazole Sodium 40 Mg Tbec (Pantoprazole sodium) .Marland Kitchen.. 1 by mouth daily for stomach 18)  True2go Blood Glucose W/device Kit (Blood glucose monitoring suppl) .... Use as directed  Other Orders: Pap Smear-[facility] ([id number]) Prescriptions: TRUE2GO BLOOD GLUCOSE W/DEVICE KIT (BLOOD GLUCOSE MONITORING SUPPL) Use as directed  #10 x 0   Entered and Authorized by:   Sarah Swaziland MD   Signed by:   Sarah Swaziland MD on 09/04/2009   Method used:   Print then Give to Patient   RxID:   [id number] TRUETRACK TEST  STRP  (GLUCOSE BLOOD) Use as directed  #3 boxes x 3   Entered and Authorized by:   Sarah Swaziland MD   Signed by:   Sarah Swaziland MD on 09/04/2009   Method used:   Print  then Give to Patient   RxID:   [id number] LOSARTAN POTASSIUM 25 MG TABS (LOSARTAN POTASSIUM) Take 1 by mouth daily for blood pressure.  #90 x 3   Entered and Authorized by:   Sarah Swaziland MD   Signed by:   Sarah Swaziland MD on 09/04/2009   Method used:   Print then Give to Patient   RxID:   [id number] SIMVASTATIN 20 MG TABS (SIMVASTATIN) Take 1 by mouth every night for cholesterol.  Take instead of Lipitor  #90 x 3   Entered and Authorized by:   Sarah Swaziland MD   Signed by:   Sarah Swaziland MD on 09/04/2009   Method used:   Print then Give to Patient   RxID:   [id number] BD ULTRA-FINE LANCETS  MISC (LANCETS) use daily  #100 x 4   Entered and Authorized by:   Sarah Swaziland MD   Signed by:   Sarah Swaziland MD on 09/04/2009   Method used:   Print then Give to Patient   RxID:   [id number] BABY ASPIRIN 81 MG CHEW (ASPIRIN) one daily  #100 x 0   Entered and Authorized by:   Sarah Swaziland MD   Signed by:   Sarah Swaziland MD on 09/04/2009   Method used:   Print then Give to Patient   RxID:   [id number] METFORMIN HCL 500 MG  XR24H-TAB (METFORMIN HCL) 2 daily  #180 x 3   Entered and Authorized by:   Sarah Swaziland MD   Signed by:   Sarah Swaziland MD on 09/04/2009   Method used:   Print then Give to Patient   RxID:   [id number] PROPRANOLOL HCL 10 MG  TABS (PROPRANOLOL HCL) one every 6 hours for elevated heart rate  #60 x 3   Entered and Authorized by:   Sarah Swaziland MD   Signed by:   Sarah Swaziland MD on 09/04/2009   Method used:   Print then Give to Patient   RxID:   [id number] TRAMADOL HCL 50 MG  TABS (TRAMADOL HCL) one three times a day  #270 x 3   Entered and Authorized by:   Sarah Swaziland MD   Signed by:   Sarah Swaziland MD on 09/04/2009   Method used:   Print then Give to Patient   RxID:    [id number] CARAFATE 1 GM/10ML  SUSP (SUCRALFATE) one three times a day as needed  #1 x 0   Entered and Authorized by:   Sarah Swaziland MD   Signed by:   Sarah Swaziland MD on 09/04/2009   Method used:   Print then Give to Patient   RxID:   [id number] LORAZEPAM 0.5 MG  TABS (LORAZEPAM) Take 1 tablet by mouth two times a day  #60 x 3   Entered and Authorized by:   Sarah Swaziland MD   Signed by:   Sarah Swaziland MD on 09/04/2009   Method used:   Print then Give to Patient   RxID:   [id number] PANTOPRAZOLE SODIUM 40 MG TBEC (PANTOPRAZOLE SODIUM) 1 by mouth daily for stomach  #90 x 2   Entered and Authorized by:   Sarah Swaziland MD   Signed by:   Sarah Swaziland MD on 09/04/2009   Method used:   Print then Give to Patient   RxID:   [id number]   Laboratory Results   Blood Tests   Date/Time Received: September 04, 2009 8:54 AM  Date/Time Reported: September 04, 2009 9:18 AM   HGBA1C: 6.6%   (  Normal Range: Non-Diabetic - 3-6%   Control Diabetic - 6-8%)  Comments: ...............test performed by......Marland KitchenBonnie A. Swaziland, MLS (ASCP)cm      Prevention & Chronic Care Immunizations   Influenza vaccine: Fluvax 3+  (10/03/2007)   Influenza vaccine due: 10/02/2008    Tetanus booster: 03/15/2008: given   Tetanus booster due: 03/16/2018    Pneumococcal vaccine: Historical  (10/12/2001)   Pneumococcal vaccine due: None  Colorectal Screening   Hemoccult: not indicated  (03/16/2008)   Hemoccult due: Not Indicated    Colonoscopy: hyperplastic polyp  (05/10/2007)   Colonoscopy due: 05/09/2017  Other Screening   Pap smear: Not documented   Pap smear due: Not Indicated    Mammogram: No specific mammographic evidence of malignancy.  Assessment: BIRADS 1. Location: [facility].     (12/13/2008)   Mammogram due: 12/13/2009   Smoking status: current  (09/04/2009)   Smoking cessation counseling: yes  (03/15/2008)  Diabetes Mellitus   HgbA1C: 6.6   (09/04/2009)   Hemoglobin A1C due: 04/12/2009    Eye exam: No diabetic retinopathy.   Per report, "pressures okay"  (08/21/2008)   Eye exam due: 08/2009    Foot exam: yes  (03/15/2008)   High risk foot: Not documented   Foot care education: Not documented   Foot exam due: 03/15/2009    Urine microalbumin/creatinine ratio: Not documented   Urine microalbumin/cr due: 03/15/2009    Diabetes flowsheet reviewed?: Yes   Progress toward A1C goal: At goal  Lipids   Total Cholesterol: 151  (06/03/2009)   Lipid panel action/deferral: LDL Direct Ordered   LDL: 87  (06/03/2009)   LDL Direct: 74  (10/12/2008)   HDL: 43  (06/03/2009)   Triglycerides: 104  (06/03/2009)   Lipid panel due: 04/24/2009    SGOT (AST): 15  (06/03/2009)   SGPT (ALT): 16  (06/03/2009)   Alkaline phosphatase: 91  (06/03/2009)   Total bilirubin: 0.4  (06/03/2009)    Lipid flowsheet reviewed?: Yes   Progress toward LDL goal: At goal  Hypertension   Last Blood Pressure: 128 / 88  (09/04/2009)   Serum creatinine: 0.54  (06/03/2009)   Serum potassium 3.8  (06/03/2009)   Basic metabolic panel due: 05/17/2009    Hypertension flowsheet reviewed?: Yes   Progress toward BP goal: At goal  Self-Management Support :   Personal Goals (by the next clinic visit) :     Personal A1C goal: 7  (10/12/2008)     Personal blood pressure goal: 130/80  (10/12/2008)     Personal LDL goal: 100  (10/12/2008)    Diabetes self-management support: Written self-care plan, Education handout  (10/12/2008)    Diabetes self-management support not done because: Good outcomes  (12/25/2008)    Hypertension self-management support: BP self-monitoring log, Written self-care plan  (01/24/2009)    Hypertension self-management support not done because: Good outcomes  (09/04/2009)    Lipid self-management support: Lipid monitoring log, Education handout, Written self-care plan  (10/12/2008)     Lipid self-management support not done because:  Good outcomes  (09/04/2009)

## 2010-02-13 NOTE — Assessment & Plan Note (Signed)
Summary: Cardiology Nuclear Study  Nuclear Med Background Indications for Stress Test: Evaluation for Ischemia, Abnormal EKG   History: Ablation, Echo, Myocardial Perfusion Study  History Comments: '06 MPS and ECHO '09 Ablation SVT  Symptoms: Chest Tightness, Diaphoresis, Palpitations    Nuclear Pre-Procedure Cardiac Risk Factors: Hypertension, Lipids, NIDDM Caffeine/Decaff Intake: None NPO After: 11:30 PM Lungs: clear IV 0.9% NS with Angio Cath: 20g     IV Site: (L) AC IV Started by: Irean Hong RN Chest Size (in) 44     Cup Size DD     Height (in): 64.25 Weight (lb): 192 BMI: 32.82  Nuclear Med Study 1 or 2 day study:  1 day     Stress Test Type:  Stress Reading MD:  Dietrich Pates, MD     Referring MD:  G. Taylor Resting Radionuclide:  Technetium 50m Tetrofosmin     Resting Radionuclide Dose:  11 mCi  Stress Radionuclide:  Technetium 12m Tetrofosmin     Stress Radionuclide Dose:  33 mCi   Stress Protocol Exercise Time (min):  3:32 min     Max HR:  144 bpm     Predicted Max HR:  164 bpm       Percent Max HR:  87.80 %     METS: 5.10    Stress Test Technologist:  Milana Na EMT-P     Nuclear Technologist:  Domenic Polite CNMT  Rest Procedure  Myocardial perfusion imaging was performed at rest 45 minutes following the intravenous administration of Myoview Technetium 50m Tetrofosmin.  Stress Procedure  The patient exercised for 3:32. The patient stopped due to fatigue, sob and denied chest pain.  There were no significant ST-T wave changes.  Myoview was injected at peak exercise and myocardial perfusion imaging was performed after a brief delay.  QPS Raw Data Images:  Stress images were motion corrected.  Soft tissue (diaphragm, breast) surround heart. Stress Images:  There is normal uptake in all areas. Rest Images:  Normal homogeneous uptake in all areas of the myocardium. Subtraction (SDS):  No evidence of ischemia. Transient Ischemic Dilatation:  1.03  (Normal  <1.22)  Lung/Heart Ratio:  .35  (Normal <0.45)  Quantitative Gated Spect Images QGS EDV:  96 ml QGS ESV:  45 ml QGS EF:  53 %   Overall Impression  Exercise Capacity: Poor exercise capacity. BP Response: Hypertensive blood pressure response. Clinical Symptoms: No chest pain ECG Impression: Less than 1 mm ST depression with T wave inverioson in leads V3-V6 in recovery; not significant for ischemia. Overall Impression Comments: Extremely poor exercise tolerance.  No evidence for ischemia or scar on myoview.  LVEF appears to be greater than calculated 53%.  Appended Document: Cardiology Nuclear Study patient needs to start walking daily (deconditioned) and stop smoking.  Appended Document: Cardiology Nuclear [facility] for pt w/ results

## 2010-02-13 NOTE — Miscellaneous (Signed)
Summary: walk in  Clinical Lists Changes c/o hot flashes & wants to change all meds to [facility]. placed with pcp in 10 minutes.Marland KitchenMarland KitchenGolden Circle RN  Jun 03, 2009 8:40 AM

## 2010-02-13 NOTE — Assessment & Plan Note (Signed)
Summary: Elevated BP and Lightheded/kf   Vital Signs:  Patient profile:   60 year old female Height:      64.25 inches Weight:      203.4 pounds BMI:     34.77 Temp:     98.6 degrees F oral Pulse rate:   96 / minute BP sitting:   153 / 87  (left arm) Cuff size:   large  Vitals Entered By: Garen Grams LPN (January 31, 2010 9:59 AM) CC: elevated bp Is Patient Diabetic? No Pain Assessment Patient in pain? no        Primary Care Provider:  Sarah Swaziland MD  CC:  elevated bp.  History of Present Illness: 1. Elevated BP:  Pt felt "funny" yesterday evening.  She had a mild, posterior headache and felt a little light-headed.  She checked her BP and it was 165/100 with a HR of 95.  She sat down for a couple of hours and then checked it again and it was 170/95.  This morning it was 152/95.  She is concerned that her blood pressure is too high.  ROS: denies chest pain, shortness of breath, LE swelling  Habits & Providers  Alcohol-Tobacco-Diet     Tobacco Status: current     Tobacco Counseling: to quit use of tobacco products     Cigarette Packs/Day: 0.5     Diet Comments: not following diabetic diet     Diet Counseling: to improve diet; diet is suboptimal  Current Medications (verified): 1)  Daily Multiple Vitamins   Tabs (Multiple Vitamin) .... Take 1 Tablet By Mouth Once A Day 2)  Calcium and Vitamin D .... Take 1 Tablet By Mouth Once A Day 3)  Nasonex 50 Mcg/act  Susp (Mometasone Furoate) .... Two Spray Each Side Daily 4)  Allergy Shots .... Weekly 5)  Lorazepam 0.5 Mg  Tabs (Lorazepam) .... Take 1 Tablet By Mouth Two Times A Day 6)  Carafate 1 Gm/49ml  Susp (Sucralfate) .... One Three Times A Day As Needed 7)  Tramadol Hcl 50 Mg  Tabs (Tramadol Hcl) .... One Three Times A Day 8)  Propranolol Hcl 10 Mg  Tabs (Propranolol Hcl) .... One Every 6 Hours For Elevated Heart Rate 9)  Metformin Hcl 500 Mg  Xr24h-Tab (Metformin Hcl) .... 2 Daily 10)  Bd Ultra-Fine Lancets  Misc  (Lancets) .... Use Daily 11)  Baby Aspirin 81 Mg Chew (Aspirin) .... One Daily 12)  B-Complex/b-12  Tabs (B Complex Vitamins) 13)  Simvastatin 20 Mg Tabs (Simvastatin) .... Take 1 By Mouth Every Night For Cholesterol.  Take Instead of Lipitor 14)  Losartan Potassium 50 Mg Tabs (Losartan Potassium) .Marland Kitchen.. 1 Tab By Mouth Daily For Blood Pressure 15)  Truetrack Test  Strp (Glucose Blood) .... Use As Directed 16)  Meclizine Hcl 12.5 Mg Tabs (Meclizine Hcl) .... Take 1 Pill If Having Symptoms.  May Repeat 12 Hours Later. 17)  Pantoprazole Sodium 40 Mg Tbec (Pantoprazole Sodium) .Marland Kitchen.. 1 By Mouth Daily For Stomach 18)  True2go Blood Glucose W/device Kit (Blood Glucose Monitoring Suppl) .... Use As Directed 19)  Gabapentin 300 Mg Caps (Gabapentin) .... 2  By Mouth Three Times A Day 20)  Venlafaxine Hcl 75 Mg Xr24h-Cap (Venlafaxine Hcl) .Marland Kitchen.. 1 By Mouth Daily. 21)  Fish Oil 1000 Mg Caps (Omega-3 Fatty Acids) .Marland Kitchen.. 1 Daily  Allergies: 1)  ! Augmentin 2)  ! Penicillin  Social History: Reviewed history from 02/23/2008 and no changes required. Married Current Smoker Alcohol use-no Drug  use-no Regular exercise-no  Physical Exam  General:  Vitals reviewed and rechecked.  Sitting comfortably.  No acute distress Eyes:  Fundoscopic exam benign Lungs:  Normal respiratory effort, chest expands symmetrically. Lungs are clear to auscultation, no crackles or wheezes. Heart:  Slightly tachycardic, regular Extremities:  no LE edema Neurologic:  alert & oriented X3, cranial nerves II-XII intact, strength normal in all extremities, sensation intact to light touch, and gait normal.   Psych:  not anxious appearing.     Impression & Recommendations:  Problem # 1:  ESSENTIAL HYPERTENSION, BENIGN (ICD-401.1) Assessment Deteriorated  Not at goal.  Will increase Losartan to 50mg .  Should probably have a BMET checked at her next visit in February.  Educated about possible side effects.  Advised for her to continue  to check her BP at home. Her updated medication list for this problem includes:    Propranolol Hcl 10 Mg Tabs (Propranolol hcl) ..... One every 6 hours for elevated heart rate    Losartan Potassium 50 Mg Tabs (Losartan potassium) .Marland Kitchen... 1 tab by mouth daily for blood pressure  Orders: [facility]- Est Level  3 ([address]109)  Complete Medication List: 1)  Daily Multiple Vitamins Tabs (Multiple vitamin) .... Take 1 tablet by mouth once a day 2)  Calcium and Vitamin D  .... Take 1 tablet by mouth once a day 3)  Nasonex 50 Mcg/act Susp (Mometasone furoate) .... Two spray each side daily 4)  Allergy Shots  .... Weekly 5)  Lorazepam 0.5 Mg Tabs (Lorazepam) .... Take 1 tablet by mouth two times a day 6)  Carafate 1 Gm/1ml Susp (Sucralfate) .... One three times a day as needed 7)  Tramadol Hcl 50 Mg Tabs (Tramadol hcl) .... One three times a day 8)  Propranolol Hcl 10 Mg Tabs (Propranolol hcl) .... One every 6 hours for elevated heart rate 9)  Metformin Hcl 500 Mg Xr24h-tab (Metformin hcl) .... 2 daily 10)  Bd Ultra-fine Lancets Misc (Lancets) .... Use daily 11)  Baby Aspirin 81 Mg Chew (Aspirin) .... One daily 12)  B-complex/b-12 Tabs (B complex vitamins) 13)  Simvastatin 20 Mg Tabs (Simvastatin) .... Take 1 by mouth every night for cholesterol.  take instead of lipitor 14)  Losartan Potassium 50 Mg Tabs (Losartan potassium) .Marland Kitchen.. 1 tab by mouth daily for blood pressure 15)  Truetrack Test Strp (Glucose blood) .... Use as directed 16)  Meclizine Hcl 12.5 Mg Tabs (Meclizine hcl) .... Take 1 pill if having symptoms.  may repeat 12 hours later. 17)  Pantoprazole Sodium 40 Mg Tbec (Pantoprazole sodium) .Marland Kitchen.. 1 by mouth daily for stomach 18)  True2go Blood Glucose W/device Kit (Blood glucose monitoring suppl) .... Use as directed 19)  Gabapentin 300 Mg Caps (Gabapentin) .... 2  by mouth three times a day 20)  Venlafaxine Hcl 75 Mg Xr24h-cap (Venlafaxine hcl) .Marland Kitchen.. 1 by mouth daily. 21)  Fish Oil 1000 Mg Caps  (Omega-3 fatty acids) .Marland Kitchen.. 1 daily  Patient Instructions: 1)  I agree, it probably was your elevated blood pressure that was making you feel funny 2)  We will increase the Losartan to help better control your blood pressure 3)  Continue to check it, if it remains elevated or drops too low please let us know 4)  Keep your follow up appointment with Dr. Swaziland Prescriptions: Claris Gladden POTASSIUM 50 MG TABS (LOSARTAN POTASSIUM) 1 tab by mouth daily for blood pressure  #30 x 3   Entered and Authorized by:   Angelena Sole MD  Signed by:   Angelena Sole MD on 01/31/2010   Method used:   Print then Give to Patient   RxID:   [id number]    Orders Added: 1)  [facility]- Est Level  3 [[address]213]

## 2010-02-13 NOTE — Miscellaneous (Signed)
Summary: refills  Clinical Lists Changes went to health dept for her neurontin. health dept cannot  fill . has to update her eligibility so they will not fill. wants a rx [facility]abapentin [facility] [address]...Marland KitchenMarland KitchenGolden Circle RN  May 24, 2009 3:40 PM   Medications: Changed medication from NEURONTIN 300 MG CAPS (GABAPENTIN) 1 tab three times a day to NEURONTIN 300 MG CAPS (GABAPENTIN) 1 tab three times a day - Signed Rx of NEURONTIN 300 MG CAPS (GABAPENTIN) 1 tab three times a day;  #90 x 1;  Signed;  Entered by: Pearlean Brownie MD;  Authorized by: Pearlean Brownie MD;  Method used: [facility] [address] [phone]*, [address], [place], [place], [place]  [address], Ph: [phone] or [phone], Fax: [phone]    Prescriptions: NEURONTIN 300 MG CAPS (GABAPENTIN) 1 tab three times a day  #90 x 1   Entered and Authorized by:   Pearlean Brownie MD   Signed by:   Pearlean Brownie MD on 05/24/2009   Method used:   [facility]ronically to        [facility] [address] [phone]* (retail)       [address]       [place], [place]  [address]       Ph: [phone] or [phone]       Fax: [phone]   RxID:   [id number]

## 2010-02-13 NOTE — Progress Notes (Signed)
Summary: phn msg  Phone Note Call from Patient Call back at Home Phone [phone]   Caller: Patient Summary of Call: pt states that she has been taking Gabapentin 2 3xdaily and new rx is for 1 3xdaily - needs new refill for 2 3xdaily pt wants to talk to Dr Swaziland Initial call taken by: De Nurse,  November 14, 2009 3:32 PM  Follow-up for Phone Call        Spoke with pt. She is taking venlaxafine and also needed gabapentin writted for 600 mg three times a day.  Rx sent and pt informed.   Follow-up by: Francely Craw Swaziland MD,  November 18, 2009 10:05 AM    New/Updated Medications: GABAPENTIN 300 MG CAPS (GABAPENTIN) 2  by mouth three times a day Prescriptions: GABAPENTIN 300 MG CAPS (GABAPENTIN) 2  by mouth three times a day  #240 x 1   Entered and Authorized by:   Tayven Renteria Swaziland MD   Signed by:   Vannesa Abair Swaziland MD on 11/18/2009   Method used:   [facility]ronically to        [facility] [address] [phone]* (retail)       [address]       [place], [place]  [address]       Ph: [phone] or [phone]       Fax: [phone]   RxID:   [id number]

## 2010-02-13 NOTE — Assessment & Plan Note (Signed)
Summary: f/u bp/eo   Vital Signs:  Patient profile:   60 year old female Weight:      184.9 pounds Temp:     98.7 degrees F oral Pulse rate:   98 / minute Pulse rhythm:   regular BP sitting:   120 / 82  (left arm) Cuff size:   regular   Primary Care Provider:  Sarah Swaziland MD   History of Present Illness: Pt reports incresing numbness in L hand.  Usually worse after she sleeps on it.  Reports her legs were sore after the ABIs study.    Taking meds as prescribed.  Has gained a few pounds.  Decreased exercise since hospitalkization.  No chest pain, HA or vision changes.  Worried about BP.  At home is sometimes 142/96 and 168/90.    Diabetic Eye Exam  Procedure date:  08/21/2008  Findings:      No diabetic retinopathy.   Per report, 'pressures okay'  Procedures Next Due Date:    Diabetic Eye Exam: 08/2009  Current Medications (verified): 1)  Daily Multiple Vitamins   Tabs (Multiple Vitamin) .... Take 1 Tablet By Mouth Once A Day 2)  Calcium and Vitamin D .... Take 1 Tablet By Mouth Once A Day 3)  Nasonex 50 Mcg/act  Susp (Mometasone Furoate) .... Two Spray Each Side Daily 4)  Allergy Shots .... Weekly 5)  Lorazepam 0.5 Mg  Tabs (Lorazepam) .... Take 1 Tablet By Mouth Two Times A Day 6)  Protonix 40 Mg  Tbec (Pantoprazole Sodium) .Marland Kitchen.. 1 Once Daily 7)  Carafate 1 Gm/61ml  Susp (Sucralfate) .... One Three Times A Day As Needed 8)  Tramadol Hcl 50 Mg  Tabs (Tramadol Hcl) .... One Three Times A Day 9)  Propranolol Hcl 10 Mg  Tabs (Propranolol Hcl) .... One Every 6 Hours For Elevated Heart Rate 10)  Xyzal 5 Mg  Tabs (Levocetirizine Dihydrochloride) .Marland Kitchen.. 1 At Bedtime 11)  Metformin Hcl 500 Mg  Xr24h-Tab (Metformin Hcl) .... 2 Daily 12)  Nova Max Glucose Test   Strp (Glucose Blood) .... Use As Directed 13)  Bd Ultra-Fine Lancets  Misc (Lancets) .... Use Daily 14)  Baby Aspirin 81 Mg Chew (Aspirin) .... One Daily 15)  Cymbalta 60 Mg Cpep (Duloxetine Hcl) .Marland Kitchen.. 1 Tab By Mouth  Daily For Depression 16)  Lipitor 10 Mg Tabs (Atorvastatin Calcium) .Marland Kitchen.. 1 Tab By Mouth Daily For Cholesterol 17)  Cheratussin Ac 100-10 Mg/1ml Syrp (Guaifenesin-Codeine) .Marland Kitchen.. 1 Teaspoon Q 6 Hours As Needed Cough X 1 Week 18)  Neurontin 300 Mg Caps (Gabapentin) .Marland Kitchen.. 1 Tab Three Times A Day 19)  Losartan Potassium 50 Mg Tabs (Losartan Potassium) .... Take One Daily. 20)  B-Complex/b-12  Tabs (B Complex Vitamins)  Allergies (verified): 1)  ! Augmentin 2)  ! Penicillin  Review of Systems       see hpi  Physical Exam  General:  Well-developed,well-nourished,in no acute distress; alert,appropriate and cooperative throughout examination.  Vitals reviewed Extremities:  Feet without lesions.  DP 2+ and symmetrical   Impression & Recommendations:  Problem # 1:  DIABETES MELLITUS, TYPE II (ICD-250.00)  Doing well.  Encouraged exercise.  Continue with current regimen Her updated medication list for this problem includes:    Metformin Hcl 500 Mg Xr24h-tab (Metformin hcl) .Marland Kitchen... 2 daily    Baby Aspirin 81 Mg Chew (Aspirin) ..... One daily    Losartan Potassium 50 Mg Tabs (Losartan potassium) .Marland Kitchen... Take one daily.  Orders: [facility]- Est Level  3 (  82423)  Problem # 2:  ESSENTIAL HYPERTENSION, BENIGN (ICD-401.1)  Dong well on increased losartan.  Will try to exercise more.  No other changes at this time.  Will check CMP when returns for fasting labs. Her updated medication list for this problem includes:    Propranolol Hcl 10 Mg Tabs (Propranolol hcl) ..... One every 6 hours for elevated heart rate    Losartan Potassium 50 Mg Tabs (Losartan potassium) .Marland Kitchen... Take one daily.  Orders: [facility]- Est Level  3 (53614)  Problem # 3:  DYSLIPIDEMIA (ICD-272.4)  Check fasting labs on return.  Continue meds. Need to stop smoking. Her updated medication list for this problem includes:    Lipitor 10 Mg Tabs (Atorvastatin calcium) .Marland Kitchen... 1 tab by mouth daily for cholesterol  Orders: [facility]- Est Level  3  (99213)Future Orders: Lipid-[facility] ([id number]) ... 01/30/2010 Comp Met-[facility] ([id number]) ... 01/30/2010  Complete Medication List: 1)  Daily Multiple Vitamins Tabs (Multiple vitamin) .... Take 1 tablet by mouth once a day 2)  Calcium and Vitamin D  .... Take 1 tablet by mouth once a day 3)  Nasonex 50 Mcg/act Susp (Mometasone furoate) .... Two spray each side daily 4)  Allergy Shots  .... Weekly 5)  Lorazepam 0.5 Mg Tabs (Lorazepam) .... Take 1 tablet by mouth two times a day 6)  Protonix 40 Mg Tbec (Pantoprazole sodium) .Marland Kitchen.. 1 once daily 7)  Carafate 1 Gm/67ml Susp (Sucralfate) .... One three times a day as needed 8)  Tramadol Hcl 50 Mg Tabs (Tramadol hcl) .... One three times a day 9)  Propranolol Hcl 10 Mg Tabs (Propranolol hcl) .... One every 6 hours for elevated heart rate 10)  Xyzal 5 Mg Tabs (Levocetirizine dihydrochloride) .Marland Kitchen.. 1 at bedtime 11)  Metformin Hcl 500 Mg Xr24h-tab (Metformin hcl) .... 2 daily 12)  Nova Max Glucose Test Strp (Glucose blood) .... Use as directed 13)  Bd Ultra-fine Lancets Misc (Lancets) .... Use daily 14)  Baby Aspirin 81 Mg Chew (Aspirin) .... One daily 15)  Cymbalta 60 Mg Cpep (Duloxetine hcl) .Marland Kitchen.. 1 tab by mouth daily for depression 16)  Lipitor 10 Mg Tabs (Atorvastatin calcium) .Marland Kitchen.. 1 tab by mouth daily for cholesterol 17)  Cheratussin Ac 100-10 Mg/68ml Syrp (Guaifenesin-codeine) .Marland Kitchen.. 1 teaspoon q 6 hours as needed cough x 1 week 18)  Neurontin 300 Mg Caps (Gabapentin) .Marland Kitchen.. 1 tab three times a day 19)  Losartan Potassium 50 Mg Tabs (Losartan potassium) .... Take one daily. 20)  B-complex/b-12 Tabs (B complex vitamins)  Patient Instructions: 1)  Try to walk at least 20 minutes most days of the week. 2)  Comes see me again in about 3 months or sooner if you need [place]. 3)  We will check your labs in April.  Make an appt to get this done. Prescriptions: LOSARTAN POTASSIUM 50 MG TABS (LOSARTAN POTASSIUM) Take one daily.  #30 x 5   Entered and Authorized  by:   Sarah Swaziland MD   Signed by:   Sarah Swaziland MD on 01/24/2009   Method used:   Print then Give to Patient   RxID:   [id number] NEURONTIN 300 MG CAPS (GABAPENTIN) 1 tab three times a day  #90 x 3   Entered and Authorized by:   Sarah Swaziland MD   Signed by:   Sarah Swaziland MD on 01/24/2009   Method used:   Print then Give to Patient   RxID:   [id number] LIPITOR 10 MG TABS (ATORVASTATIN CALCIUM) 1 tab by mouth daily  for cholesterol  #90 x 1   Entered and Authorized by:   Sarah Swaziland MD   Signed by:   Sarah Swaziland MD on 01/24/2009   Method used:   Print then Give to Patient   RxID:   [id number] CYMBALTA 60 MG CPEP (DULOXETINE HCL) 1 tab by mouth daily for depression  #60 x 1   Entered and Authorized by:   Sarah Swaziland MD   Signed by:   Sarah Swaziland MD on 01/24/2009   Method used:   Print then Give to Patient   RxID:   [id number] BABY ASPIRIN 81 MG CHEW (ASPIRIN) one daily  #100 x 0   Entered and Authorized by:   Sarah Swaziland MD   Signed by:   Sarah Swaziland MD on 01/24/2009   Method used:   Print then Give to Patient   RxID:   [id number] BD ULTRA-FINE LANCETS  MISC (LANCETS) use daily  #100 x 4   Entered and Authorized by:   Sarah Swaziland MD   Signed by:   Sarah Swaziland MD on 01/24/2009   Method used:   Print then Give to Patient   RxID:   [id number] NOVA MAX GLUCOSE TEST   STRP (GLUCOSE BLOOD) use as directed  #100 x 3   Entered and Authorized by:   Sarah Swaziland MD   Signed by:   Sarah Swaziland MD on 01/24/2009   Method used:   Print then Give to Patient   RxID:   [id number] METFORMIN HCL 500 MG  XR24H-TAB (METFORMIN HCL) 2 daily  #60 x 12   Entered and Authorized by:   Sarah Swaziland MD   Signed by:   Sarah Swaziland MD on 01/24/2009   Method used:   Print then Give to Patient   RxID:   [id number] XYZAL 5 MG  TABS (LEVOCETIRIZINE DIHYDROCHLORIDE) 1 at bedtime  #90 x 5   Entered and Authorized by:   Sarah Swaziland MD   Signed by:   Sarah  Swaziland MD on 01/24/2009   Method used:   Print then Give to Patient   RxID:   [id number] PROPRANOLOL HCL 10 MG  TABS (PROPRANOLOL HCL) one every 6 hours for elevated heart rate  #1 x 0   Entered and Authorized by:   Sarah Swaziland MD   Signed by:   Sarah Swaziland MD on 01/24/2009   Method used:   Print then Give to Patient   RxID:   [id number] TRAMADOL HCL 50 MG  TABS (TRAMADOL HCL) one three times a day  #1 x 0   Entered and Authorized by:   Sarah Swaziland MD   Signed by:   Sarah Swaziland MD on 01/24/2009   Method used:   Print then Give to Patient   RxID:   [id number] CARAFATE 1 GM/10ML  SUSP (SUCRALFATE) one three times a day as needed  #1 x 0   Entered and Authorized by:   Sarah Swaziland MD   Signed by:   Sarah Swaziland MD on 01/24/2009   Method used:   Print then Give to Patient   RxID:   [id number] PROTONIX 40 MG  TBEC (PANTOPRAZOLE SODIUM) 1 once daily  #30 x 12   Entered and Authorized by:   Sarah Swaziland MD   Signed by:   Sarah Swaziland MD on 01/24/2009   Method used:   Print then Give to Patient   RxID:   [id number] LORAZEPAM 0.5 MG  TABS (LORAZEPAM) Take 1 tablet by mouth  two times a day  #60 x 4   Entered and Authorized by:   Sarah Swaziland MD   Signed by:   Sarah Swaziland MD on 01/24/2009   Method used:   Print then Give to Patient   RxID:   [id number] NASONEX 50 MCG/ACT  SUSP (MOMETASONE FUROATE) Two spray each side daily  #1 x 0   Entered and Authorized by:   Sarah Swaziland MD   Signed by:   Sarah Swaziland MD on 01/24/2009   Method used:   Print then Give to Patient   RxID:   [id number] CALCIUM AND VITAMIN D Take 1 tablet by mouth once a day  #90 x 0   Entered and Authorized by:   Sarah Swaziland MD   Signed by:   Sarah Swaziland MD on 01/24/2009   Method used:   Print then Give to Patient   RxID:   [id number] DAILY MULTIPLE VITAMINS   TABS (MULTIPLE VITAMIN) Take 1 tablet by mouth once a day  #90 x 0   Entered and Authorized by:   Sarah Swaziland  MD   Signed by:   Sarah Swaziland MD on 01/24/2009   Method used:   Print then Give to Patient   RxID:   [id number]    Prevention & Chronic Care Immunizations   Influenza vaccine: Fluvax 3+  (10/03/2007)   Influenza vaccine due: 10/02/2008    Tetanus booster: 03/15/2008: given   Tetanus booster due: 03/16/2018    Pneumococcal vaccine: Historical  (10/12/2001)   Pneumococcal vaccine due: None  Colorectal Screening   Hemoccult: not indicated  (03/16/2008)   Hemoccult due: Not Indicated    Colonoscopy: hyperplastic polyp  (05/10/2007)   Colonoscopy due: 05/09/2017  Other Screening   Pap smear: Not documented   Pap smear due: Not Indicated    Mammogram: No specific mammographic evidence of malignancy.  Assessment: BIRADS 1. Location: [facility].     (12/13/2008)   Mammogram due: 12/13/2009   Smoking status: current  (12/25/2008)   Smoking cessation counseling: yes  (03/15/2008)  Diabetes Mellitus   HgbA1C: 6.3  (10/12/2008)   Hemoglobin A1C due: 04/12/2009    Eye exam: No diabetic retinopathy.   Per report, "pressures okay"  (08/21/2008)   Eye exam due: 08/2009    Foot exam: yes  (03/15/2008)   High risk foot: Not documented   Foot care education: Not documented   Foot exam due: 03/15/2009    Urine microalbumin/creatinine ratio: Not documented   Urine microalbumin/cr due: 03/15/2009    Diabetes flowsheet reviewed?: Yes   Progress toward A1C goal: At goal  Lipids   Total Cholesterol: 217  (04/23/2008)   Lipid panel action/deferral: LDL Direct Ordered   LDL: 387  (04/23/2008)   LDL Direct: 74  (10/12/2008)   HDL: 37  (04/23/2008)   Triglycerides: 151  (04/23/2008)   Lipid panel due: 04/24/2009    SGOT (AST): 15  (05/17/2008)   SGPT (ALT): 15  (05/17/2008) CMP ordered    Alkaline phosphatase: 93  (05/17/2008)   Total bilirubin: 0.5  (05/17/2008)    Lipid flowsheet reviewed?: Yes   Progress toward LDL goal: At  goal  Hypertension   Last Blood Pressure: 120 / 82  (01/24/2009)   Serum creatinine: 0.66  (05/17/2008)   Serum potassium 3.6  (05/17/2008) CMP ordered    Basic metabolic panel due: 05/17/2009    Hypertension flowsheet reviewed?: Yes   Progress toward BP goal: Improved   Hypertension comments: Not  exercising.  Thinking about walking.    Self-Management Support :   Personal Goals (by the next clinic visit) :     Personal A1C goal: 7  (10/12/2008)     Personal blood pressure goal: 130/80  (10/12/2008)     Personal LDL goal: 100  (10/12/2008)    Diabetes self-management support: Written self-care plan, Education handout  (10/12/2008)    Diabetes self-management support not done because: Good outcomes  (12/25/2008)    Hypertension self-management support: BP self-monitoring log, Written self-care plan  (01/24/2009)   Hypertension self-care plan printed.    Lipid self-management support: Lipid monitoring log, Education handout, Written self-care plan  (10/12/2008)     Lipid self-management support not done because: Good outcomes  (01/24/2009)

## 2010-02-13 NOTE — Progress Notes (Signed)
Summary: phn msg  Phone Note Call from Patient Call back at Home Phone [phone]   Caller: Patient Summary of Call: needs to talk to nurse about taking Black Cohosh since she has diabetes does she still want to take 6 Gabapentin since it is not working for her hot flashes? Initial call taken by: De Nurse,  July 16, 2009 3:10 PM  Follow-up for Phone Call        she wants to know if the black cohosh will help before she tries it. told her I will ask md & let her know tomorrow. states the additional gabapentin has not helped eother.  wants something because " I am drowning". states water pours off her scalp, behind ears & she is miserable.  told her md will be here tomorrow & one of [place] will call her back. she was satisfied with this plan Follow-up by: Golden Circle RN,  July 16, 2009 3:13 PM  Additional Follow-up for Phone Call Additional follow up Details #1::        I sent a prescription in for a new medicine, venlaxafine.  This may help her, but it will take several weeks.  She can stop the gabapentin, but should probably titrate back down  (Stop the morning dose, then 3 days later stop the midday dose, and then 3 days later stop the evening dose).   Additional Follow-up by: Sarah Swaziland MD,  July 17, 2009 11:15 AM    Additional Follow-up for Phone Call Additional follow up Details #2::    LM Follow-up by: Golden Circle RN,  July 17, 2009 11:32 AM  Additional Follow-up for Phone Call Additional follow up Details #3:: Details for Additional Follow-up Action Taken: LM Additional Follow-up by: Golden Circle RN,  July 17, 2009 2:58 PM  New/Updated Medications: VENLAFAXINE HCL 75 MG XR24H-CAP (VENLAFAXINE HCL) Take 1 daily for hot flashes. Prescriptions: VENLAFAXINE HCL 75 MG XR24H-CAP (VENLAFAXINE HCL) Take 1 daily for hot flashes.  #90 x 0   Entered and Authorized by:   Sarah Swaziland MD   Signed by:   Sarah Swaziland MD on 07/17/2009   Method used:   Electronically to      [facility] [address] [phone]* (retail)       [address]       [place], [place]  [address]       Ph: [phone] or [phone]       Fax: [phone]   RxID:   [id number]  gave her above info & had her repeat the titrating instructions back to me...sign

## 2010-02-13 NOTE — Progress Notes (Signed)
Summary: cbg 70s-90s  Phone Note Call from Patient   Caller: Patient Call For: [phone] Summary of Call: Please call her back regarding drop in b/s Initial call taken by: Abundio Miu,  October 18, 2009 10:35 AM  Follow-up for Phone Call        am cbg was 59. it was 76 a few days ago. says she feels a littlelightheaded when this is low.  advised getting up & eating. reviewed last few meals with her. advised more protien & vegtables & less fruit & fats. has been eating sherbert nightly. advised changing to a protien HS & gave examples. A1Cs have been good. has a starch with each meal. advised smaller portion of this & more vegt/meat. make sure lean cuts of meat & pat grease off the turky bacon or sausage. she is due for a visit nect month with her pcp. schedule is not up yet. she will call back in 1-2 wks to make the appt Follow-up by: Golden Circle RN,  October 18, 2009 10:50 AM  Additional Follow-up for Phone Call Additional follow up Details #1::        Agree with plan.   Additional Follow-up by: Sarah Swaziland MD,  October 21, 2009 9:35 AM

## 2010-02-13 NOTE — Assessment & Plan Note (Signed)
Summary: rov--pt having hot flashes and feels like she is going to pas...   Visit Type:  Follow-up Primary Provider:  Sarah Swaziland MD   History of Present Illness: [name] returns today for followup of her palpiations, HTN and dyslipidemia.  Her main complaint today is that she has had a couple of episodes of sudden onset diaphorsis, associated with chest tightness.  These are not related to exertion.  No clear sob.  No peripheral edema.  She is still smoking and has DM.  There is a question of neuropathy.  Current Medications (verified): 1)  Daily Multiple Vitamins   Tabs (Multiple Vitamin) .... Take 1 Tablet By Mouth Once A Day 2)  Calcium and Vitamin D .... Take 1 Tablet By Mouth Once A Day 3)  Nasonex 50 Mcg/act  Susp (Mometasone Furoate) .... Two Spray Each Side Daily 4)  Allergy Shots .... Weekly 5)  Lorazepam 0.5 Mg  Tabs (Lorazepam) .... Take 1 Tablet By Mouth Two Times A Day 6)  Carafate 1 Gm/72ml  Susp (Sucralfate) .... One Three Times A Day As Needed 7)  Tramadol Hcl 50 Mg  Tabs (Tramadol Hcl) .... One Three Times A Day 8)  Propranolol Hcl 10 Mg  Tabs (Propranolol Hcl) .... One Every 6 Hours For Elevated Heart Rate 9)  Metformin Hcl 500 Mg  Xr24h-Tab (Metformin Hcl) .... 2 Daily 10)  Bd Ultra-Fine Lancets  Misc (Lancets) .... Use Daily 11)  Baby Aspirin 81 Mg Chew (Aspirin) .... One Daily 12)  B-Complex/b-12  Tabs (B Complex Vitamins) 13)  Diovan 80 Mg Tabs (Valsartan) .Marland Kitchen.. 1 By Mouth Daily For Blood Pressure.  Take Instead of Cozaar) 14)  Gabapentin 300 Mg Caps (Gabapentin) .... Increase To 2 At Night For 3 Days, Then 2 in The Morning and 2 At Night For 3 Days, and Then 2 Three Times A Day. 15)  Simvastatin 20 Mg Tabs (Simvastatin) .... Take 1 By Mouth Every Night For Cholesterol.  Take Instead of Lipitor 16)  Losartan Potassium 25 Mg Tabs (Losartan Potassium) .... Take 1 By Mouth Daily For Blood Pressure. 17)  Truetrack Test  Strp (Glucose Blood) .... Use As Directed 18)   Omeprazole 40 Mg Cpdr (Omeprazole) .... Take 1 Daily For Stomach  Allergies (verified): 1)  ! Augmentin 2)  ! 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 Ladona Ridgel  Review of Systems       The patient complains of chest pain.  The patient denies syncope, dyspnea on exertion, and peripheral edema.    Vital Signs:  Patient profile:   60 year old female Height:      64.25 inches Weight:      193 pounds BMI:     32.99 Pulse rate:   84 / minute BP sitting:   152 / 90  (left arm)  Vitals Entered By: Laurance Flatten CMA (Jun 11, 2009 2:51 PM)  Physical Exam  General:  Well-developed,well-nourished,in  no acute distress; alert,appropriate and cooperative throughout examination. Vitals noted Head:  normocephalic and atraumatic Eyes:  PERRLA/EOM intact; conjunctiva and lids normal. Mouth:  mild pharyngeal erythema.  no exudate Neck:  Neck supple, no JVD. No masses, thyromegaly or abnormal cervical nodes. Lungs:  Normal respiratory effort, chest expands symmetrically. Lungs are clear to auscultation, no crackles or wheezes. Heart:  Normal rate and regular rhythm. S1 and S2 normal without  gallop, murmur, click, rub or other extra sounds. Abdomen:  Bowel sounds positive; abdomen soft and non-tender without masses, organomegaly, or hernias noted. No hepatosplenomegaly. Msk:  feet without lesions Pulses:  2+ dp pulses Extremities:  No clubbing or cyanosis. Neurologic:  Alert and oriented x 3.   EKG  Procedure date:  06/11/2009  Findings:      Normal sinus rhythm with rate of: 84. Significant T-wave inversion noted:     Impression & Recommendations:  Problem # 1:  CHEST DISCOMFORT (ICD-786.59)  Her symptoms are atypical for CAD but she does have an abnormal ECG and multiple cardiac risk factors. Her updated medication list for this problem includes:    Propranolol Hcl 10 Mg Tabs (Propranolol hcl) ..... One every 6 hours for elevated heart rate    Baby Aspirin 81 Mg Chew (Aspirin) ..... One daily  Orders: Nuclear Stress Test (Nuc Stress Test)  Problem # 2:  ESSENTIAL HYPERTENSION, BENIGN (ICD-401.1) Her blood pressure is elevated today.  will ask her to continue her medical therapy and maintain a low sodium diet. Her updated medication list for this problem includes:    Propranolol Hcl 10 Mg Tabs (Propranolol hcl) ..... One every 6 hours for elevated heart rate    Baby Aspirin 81 Mg Chew (Aspirin) ..... One daily    Diovan 80 Mg Tabs (Valsartan) .Marland Kitchen... 1 by mouth daily for blood pressure.  take instead of cozaar)    Losartan Potassium 25 Mg Tabs (Losartan potassium) .Marland Kitchen... Take 1 by mouth daily for blood pressure.  Orders: Nuclear Stress Test (Nuc Stress Test)  Problem # 3:  DYSLIPIDEMIA (ICD-272.4) She will continue her statin therapy. Her updated medication list for this problem includes:    Simvastatin 20 Mg Tabs (Simvastatin) .Marland Kitchen... Take 1 by mouth every night for cholesterol.  take instead of lipitor  Patient Instructions: 1)  Your physician has requested that you have an exercise stress myoview.  For further information please visit [email or web address]   Please follow instruction sheet, as given.

## 2010-02-13 NOTE — Miscellaneous (Signed)
Summary: open place on abd  Clinical Lists Changes states she has a small open area with a "gash" on it. she used cornstarch & gauze. appt in am to see Dr. Ashley Royalty. told her to take the bandage off & not use cornstarch as if it is a fungus, they will eat the cornstarch & make it worse. she agreed.Golden Circle RN  July 25, 2009 4:33 PM

## 2010-02-13 NOTE — Progress Notes (Signed)
Summary: pt wants sooner appt  Phone Note Call from Patient Call back at Home Phone [phone]   Caller: Patient Reason for Call: Talk to Nurse, Talk to Doctor Summary of Call: pt wants to be seen prior to appt because she is sweating so much it's like a heart attack patient Initial call taken by: Omer Jack,  Jun 06, 2009 11:37 AM  Follow-up for Phone Call        pt will see Dr Ladona Ridgel on 06/11/09 at 3:15  pt aware Dennis Bast, RN, BSN  Jun 06, 2009 11:50 AM

## 2010-02-13 NOTE — Assessment & Plan Note (Signed)
Summary: f/up,tcb   Vital Signs:  Patient profile:   60 year old female Weight:      198 pounds Pulse rate:   100 / minute BP sitting:   134 / 83  (right arm) Cuff size:   large  Vitals Entered By: Arlyss Repress CMA, (December 02, 2009 8:38 AM) CC: f/up DM and refill meds Is Patient Diabetic? Yes Pain Assessment Patient in pain? no        Primary Care Provider:  Sarah Swaziland MD  CC:  f/up DM and refill meds.  History of Present Illness: Glucose numbers are going down.  Feels good about this and wants to get A1c checked today.  Unsure why they are going down.  Low of 76, which scared her.  No symptoms, but she called Kennon Rounds her to discuss at the time.  Has noted some decreased appetite, and some days with increased appetite.    No further chest pain.  No SOB.    Glucose ranging 76 to 130s.    Hot flashes doing better.  Taking venlafaxine and gabapentin.       Habits & Providers  Alcohol-Tobacco-Diet     Tobacco Status: current     Tobacco Counseling: to quit use of tobacco products     Cigarette Packs/Day: 0.5  Current Medications (verified): 1)  Daily Multiple Vitamins   Tabs (Multiple Vitamin) .... Take 1 Tablet By Mouth Once A Day 2)  Calcium and Vitamin D .... Take 1 Tablet By Mouth Once A Day 3)  Nasonex 50 Mcg/act  Susp (Mometasone Furoate) .... Two Spray Each Side Daily 4)  Allergy Shots .... Weekly 5)  Lorazepam 0.5 Mg  Tabs (Lorazepam) .... Take 1 Tablet By Mouth Two Times A Day 6)  Carafate 1 Gm/44ml  Susp (Sucralfate) .... One Three Times A Day As Needed 7)  Tramadol Hcl 50 Mg  Tabs (Tramadol Hcl) .... One Three Times A Day 8)  Propranolol Hcl 10 Mg  Tabs (Propranolol Hcl) .... One Every 6 Hours For Elevated Heart Rate 9)  Metformin Hcl 500 Mg  Xr24h-Tab (Metformin Hcl) .... 2 Daily 10)  Bd Ultra-Fine Lancets  Misc (Lancets) .... Use Daily 11)  Baby Aspirin 81 Mg Chew (Aspirin) .... One Daily 12)  B-Complex/b-12  Tabs (B Complex Vitamins) 13)   Simvastatin 20 Mg Tabs (Simvastatin) .... Take 1 By Mouth Every Night For Cholesterol.  Take Instead of Lipitor 14)  Losartan Potassium 25 Mg Tabs (Losartan Potassium) .... Take 1 By Mouth Daily For Blood Pressure. 15)  Truetrack Test  Strp (Glucose Blood) .... Use As Directed 16)  Meclizine Hcl 12.5 Mg Tabs (Meclizine Hcl) .... Take 1 Pill If Having Symptoms.  May Repeat 12 Hours Later. 17)  Pantoprazole Sodium 40 Mg Tbec (Pantoprazole Sodium) .Marland Kitchen.. 1 By Mouth Daily For Stomach 18)  True2go Blood Glucose W/device Kit (Blood Glucose Monitoring Suppl) .... Use As Directed 19)  Gabapentin 300 Mg Caps (Gabapentin) .... 2  By Mouth Three Times A Day 20)  Venlafaxine Hcl 75 Mg Xr24h-Cap (Venlafaxine Hcl) .Marland Kitchen.. 1 By Mouth Daily. 21)  Fish Oil 1000 Mg Caps (Omega-3 Fatty Acids) .Marland Kitchen.. 1 Daily  Allergies: 1)  ! Augmentin 2)  ! Penicillin PMH-FH-SH reviewed for relevance  Review of Systems       see HPI  Physical Exam  General:  Well-developed,well-nourished,in no acute distress; alert,appropriate and cooperative throughout examination Lungs:  Normal respiratory effort, chest expands symmetrically. Lungs are clear to auscultation, no crackles or wheezes.  Heart:  Normal rate and regular rhythm. S1 and S2 normal without gallop, murmur, click, rub or other extra sounds.  Diabetes Management Exam:    Foot Exam (with socks and/or shoes not present):       Sensory-Monofilament:          Left foot: normal          Right foot: normal   Impression & Recommendations:  Problem # 1:  ESSENTIAL HYPERTENSION, BENIGN (ICD-401.1) Not quite at goal today.  Pt has flucutated a fair amount with her readings.  No change to meds today.  DASH diet, exercise and smoking cessation encouraged.  Follw up 3 months, sooner prn Her updated medication list for this problem includes:    Propranolol Hcl 10 Mg Tabs (Propranolol hcl) ..... One every 6 hours for elevated heart rate    Losartan Potassium 25 Mg Tabs (Losartan  potassium) .Marland Kitchen... Take 1 by mouth daily for blood pressure.  Orders: T-Basic Metabolic Panel [phone]) [facility]- Est  Level 4 (57846)  Problem # 2:  DYSLIPIDEMIA (ICD-272.4)  At goal.  No changes today Her updated medication list for this problem includes:    Simvastatin 20 Mg Tabs (Simvastatin) .Marland Kitchen... Take 1 by mouth every night for cholesterol.  take instead of lipitor  Orders: [facility]- Est  Level 4 (96295)  Problem # 3:  DIABETES MELLITUS, TYPE II (ICD-250.00) Doing well.  Continue current regimen.  Flu shot already given this year at allergist's office.  Follow up 3 months, sooner as needed.   Her updated medication list for this problem includes:    Metformin Hcl 500 Mg Xr24h-tab (Metformin hcl) .Marland Kitchen... 2 daily    Baby Aspirin 81 Mg Chew (Aspirin) ..... One daily    Losartan Potassium 25 Mg Tabs (Losartan potassium) .Marland Kitchen... Take 1 by mouth daily for blood pressure.  Orders: A1C-[facility] (28413) [facility]- Est  Level 4 (24401)  Problem # 4:  TOBACCO ABUSE (ICD-305.1)  Counseled.  Resources offered.  Pt agrees to meet with Dr. Raymondo Band if she does not quit by her return appt with me (February, 2012).  Orders: [facility]- Est  Level 4 (02725)  Complete Medication List: 1)  Daily Multiple Vitamins Tabs (Multiple vitamin) .... Take 1 tablet by mouth once a day 2)  Calcium and Vitamin D  .... Take 1 tablet by mouth once a day 3)  Nasonex 50 Mcg/act Susp (Mometasone furoate) .... Two spray each side daily 4)  Allergy Shots  .... Weekly 5)  Lorazepam 0.5 Mg Tabs (Lorazepam) .... Take 1 tablet by mouth two times a day 6)  Carafate 1 Gm/53ml Susp (Sucralfate) .... One three times a day as needed 7)  Tramadol Hcl 50 Mg Tabs (Tramadol hcl) .... One three times a day 8)  Propranolol Hcl 10 Mg Tabs (Propranolol hcl) .... One every 6 hours for elevated heart rate 9)  Metformin Hcl 500 Mg Xr24h-tab (Metformin hcl) .... 2 daily 10)  Bd Ultra-fine Lancets Misc (Lancets) .... Use daily 11)  Baby Aspirin 81 Mg Chew  (Aspirin) .... One daily 12)  B-complex/b-12 Tabs (B complex vitamins) 13)  Simvastatin 20 Mg Tabs (Simvastatin) .... Take 1 by mouth every night for cholesterol.  take instead of lipitor 14)  Losartan Potassium 25 Mg Tabs (Losartan potassium) .... Take 1 by mouth daily for blood pressure. 15)  Truetrack Test Strp (Glucose blood) .... Use as directed 16)  Meclizine Hcl 12.5 Mg Tabs (Meclizine hcl) .... Take 1 pill if having symptoms.  may repeat  12 hours later. 17)  Pantoprazole Sodium 40 Mg Tbec (Pantoprazole sodium) .Marland Kitchen.. 1 by mouth daily for stomach 18)  True2go Blood Glucose W/device Kit (Blood glucose monitoring suppl) .... Use as directed 19)  Gabapentin 300 Mg Caps (Gabapentin) .... 2  by mouth three times a day 20)  Venlafaxine Hcl 75 Mg Xr24h-cap (Venlafaxine hcl) .Marland Kitchen.. 1 by mouth daily. 21)  Fish Oil 1000 Mg Caps (Omega-3 fatty acids) .Marland Kitchen.. 1 daily Prescriptions: VENLAFAXINE HCL 75 MG XR24H-CAP (VENLAFAXINE HCL) 1 by mouth daily.  #90 x 1   Entered and Authorized by:   Sarah Swaziland MD   Signed by:   Sarah Swaziland MD on 12/02/2009   Method used:   Print then Give to Patient   RxID:   [id number] VENLAFAXINE HCL 75 MG XR24H-CAP (VENLAFAXINE HCL) 1 by mouth daily.  #90 x 1   Entered and Authorized by:   Sarah Swaziland MD   Signed by:   Sarah Swaziland MD on 12/02/2009   Method used:   Print then Give to Patient   RxID:   [id number] VENLAFAXINE HCL 75 MG XR24H-CAP (VENLAFAXINE HCL) 1 by mouth daily.  #90 x 1   Entered and Authorized by:   Sarah Swaziland MD   Signed by:   Sarah Swaziland MD on 12/02/2009   Method used:   Print then Give to Patient   RxID:   [id number] GABAPENTIN 300 MG CAPS (GABAPENTIN) 2  by mouth three times a day  #240 x 1   Entered and Authorized by:   Sarah Swaziland MD   Signed by:   Sarah Swaziland MD on 12/02/2009   Method used:   Print then Give to Patient   RxID:   [id number] [id number] BLOOD GLUCOSE W/DEVICE KIT (BLOOD GLUCOSE MONITORING SUPPL) Use as  directed  #10 x 0   Entered and Authorized by:   Sarah Swaziland MD   Signed by:   Sarah Swaziland MD on 12/02/2009   Method used:   Print then Give to Patient   RxID:   [id number] PANTOPRAZOLE SODIUM 40 MG TBEC (PANTOPRAZOLE SODIUM) 1 by mouth daily for stomach  #90 x 2   Entered and Authorized by:   Sarah Swaziland MD   Signed by:   Sarah Swaziland MD on 12/02/2009   Method used:   Print then Give to Patient   RxID:   [id number] RKYHCWCBJ TEST  STRP (GLUCOSE BLOOD) Use as directed  #3 boxes x 3   Entered and Authorized by:   Sarah Swaziland MD   Signed by:   Sarah Swaziland MD on 12/02/2009   Method used:   Print then Give to Patient   RxID:   [id number] GGYIRSWN POTASSIUM 25 MG TABS (LOSARTAN POTASSIUM) Take 1 by mouth daily for blood pressure.  #90 x 3   Entered and Authorized by:   Sarah Swaziland MD   Signed by:   Sarah Swaziland MD on 12/02/2009   Method used:   Print then Give to Patient   RxID:   [id number] SIMVASTATIN 20 MG TABS (SIMVASTATIN) Take 1 by mouth every night for cholesterol.  Take instead of Lipitor  #90 x 3   Entered and Authorized by:   Sarah Swaziland MD   Signed by:   Sarah Swaziland MD on 12/02/2009   Method used:   Print then Give to Patient   RxID:   [id number] BD ULTRA-FINE LANCETS  MISC (LANCETS) use daily  #100 x 4   Entered and Authorized by:  Sarah Swaziland MD   Signed by:   Sarah Swaziland MD on 12/02/2009   Method used:   Print then Give to Patient   RxID:   [id number] METFORMIN HCL 500 MG  XR24H-TAB (METFORMIN HCL) 2 daily  #180 x 3   Entered and Authorized by:   Sarah Swaziland MD   Signed by:   Sarah Swaziland MD on 12/02/2009   Method used:   Print then Give to Patient   RxID:   [id number] TRAMADOL HCL 50 MG  TABS (TRAMADOL HCL) one three times a day  #270 x 3   Entered and Authorized by:   Sarah Swaziland MD   Signed by:   Sarah Swaziland MD on 12/02/2009   Method used:   Print then Give to Patient   RxID:   [id number] CARAFATE 1  GM/10ML  SUSP (SUCRALFATE) one three times a day as needed  #1 x 0   Entered and Authorized by:   Sarah Swaziland MD   Signed by:   Sarah Swaziland MD on 12/02/2009   Method used:   Print then Give to Patient   RxID:   [id number] LORAZEPAM 0.5 MG  TABS (LORAZEPAM) Take 1 tablet by mouth two times a day  #60 x 3   Entered and Authorized by:   Sarah Swaziland MD   Signed by:   Sarah Swaziland MD on 12/02/2009   Method used:   Print then Give to Patient   RxID:   [id number] NASONEX 50 MCG/ACT  SUSP (MOMETASONE FUROATE) Two spray each side daily  #1 x 0   Entered and Authorized by:   Sarah Swaziland MD   Signed by:   Sarah Swaziland MD on 12/02/2009   Method used:   Print then Give to Patient   RxID:   [id number]    Orders Added: 1)  T-Basic Metabolic Panel [09323-55732] 2)  A1C-[facility] [83036] 3)  [facility]- Est  Level 4 [20254]     Prevention & Chronic Care Immunizations   Influenza vaccine: Fluvax 3+  (10/03/2007)   Influenza vaccine deferral: Not indicated  (12/02/2009)   Influenza vaccine due: 09/13/2010    Tetanus booster: 03/15/2008: given   Tetanus booster due: 03/16/2018    Pneumococcal vaccine: Historical  (10/12/2001)   Pneumococcal vaccine due: None  Colorectal Screening   Hemoccult: not indicated  (03/16/2008)   Hemoccult due: Not Indicated    Colonoscopy: hyperplastic polyp  (05/10/2007)   Colonoscopy due: 05/09/2017  Other Screening   Pap smear: NEGATIVE FOR INTRAEPITHELIAL LESIONS OR MALIGNANCY.  (09/04/2009)   Pap smear due: Not Indicated    Mammogram: No specific mammographic evidence of malignancy.  Assessment: BIRADS 1. Location: [facility].     (12/13/2008)   Mammogram due: 12/13/2009   Smoking status: current  (12/02/2009)   Smoking cessation counseling: yes  (03/15/2008)  Diabetes Mellitus   HgbA1C: 6.5  (12/02/2009)   Hemoglobin A1C due: 04/12/2009    Eye exam: No diabetic retinopathy.   Per report, "pressures okay"   (08/21/2008)   Eye exam due: 08/2009    Foot exam: yes  (12/02/2009)   Foot exam action/deferral: Do today   High risk foot: Not documented   Foot care education: Not documented   Foot exam due: 03/15/2009    Urine microalbumin/creatinine ratio: Not documented   Urine microalbumin/cr due: 03/15/2009    Diabetes flowsheet reviewed?: Yes   Progress toward A1C goal: At goal  Lipids   Total Cholesterol: 151  (06/03/2009)  Lipid panel action/deferral: LDL Direct Ordered   LDL: 87  (06/03/2009)   LDL Direct: 74  (10/12/2008)   HDL: 43  (06/03/2009)   Triglycerides: 104  (06/03/2009)   Lipid panel due: 04/24/2009    SGOT (AST): 15  (06/03/2009)   SGPT (ALT): 16  (06/03/2009)   Alkaline phosphatase: 91  (06/03/2009)   Total bilirubin: 0.4  (06/03/2009)    Lipid flowsheet reviewed?: Yes   Progress toward LDL goal: At goal  Hypertension   Last Blood Pressure: 134 / 83  (12/02/2009)   Serum creatinine: 0.54  (06/03/2009)   BMP action: Ordered   Serum potassium 3.8  (06/03/2009)   Basic metabolic panel due: 05/17/2009    Hypertension flowsheet reviewed?: Yes   Progress toward BP goal: Deteriorated  Self-Management Support :   Personal Goals (by the next clinic visit) :     Personal A1C goal: 7  (10/12/2008)     Personal blood pressure goal: 130/80  (10/12/2008)     Personal LDL goal: 100  (10/12/2008)    Diabetes self-management support: Written self-care plan, Education handout  (10/12/2008)    Diabetes self-management support not done because: Good outcomes  (12/25/2008)    Hypertension self-management support: Written self-care plan, Education handout  (12/02/2009)   Hypertension self-care plan printed.   Hypertension education handout printed    Hypertension self-management support not done because: Good outcomes  (09/04/2009)    Lipid self-management support: Lipid monitoring log, Education handout, Written self-care plan  (10/12/2008)     Lipid self-management  support not done because: Good outcomes  (12/02/2009)   Nursing Instructions: Diabetic foot exam today   Laboratory Results   Blood Tests   Date/Time Received: December 02, 2009 9:37 AM  Date/Time Reported: December 02, 2009 10:15 AM   HGBA1C: 6.5%   (Normal Range: Non-Diabetic - 3-6%   Control Diabetic - 6-8%)  Comments: ...........test performed by...........Marland KitchenTerese Door, CMA       Diabetic Foot Exam Foot Inspection Is there a history of a foot ulcer?              No Is there a foot ulcer now?              No Can the patient see the bottom of their feet?          Yes Are the shoes appropriate in style and fit?          Yes Is there swelling or an abnormal foot shape?          No Are the toenails long?                No Are the toenails thick?                No Are the toenails ingrown?              No Is there heavy callous build-up?              No Is there a claw toe deformity?              No Is there elevated skin temperature?            No Is there limited ankle dorsiflexion?            No Is there foot or ankle muscle weakness?            No  Diabetic Foot Care Education Pulse Check  Right Foot          Left Foot Dorsalis Pedis:        normal               10-g (5.07) Semmes-Weinstein Monofilament Test           Right Foot          Left Foot Visual Inspection               Test Control      normal         normal Site 1         normal         normal Site 2         normal         normal Site 3         normal         normal Site 4         normal         normal Site 5         normal         normal Site 6         normal         normal Site 7         normal         normal Site 8         normal         normal Site 9         normal         normal Site 10         normal         normal  Impression      normal         normal

## 2010-02-13 NOTE — Letter (Signed)
Summary: Generic Letter  [facility]  [address]   [place], [place] [address]   Phone: [phone]  Fax: [phone]    06/04/2009  [facility] [address] [place], [place]  [address]  Dear Ms. Braun, I reviewed your labs, and I am happy to tell you that everything looks good.  Your cholesterol is exactly where we want it, so please keep up the good work!    Sincerely,   Daris Aristizabal Swaziland MD  Appended Document: Generic Letter mailed.

## 2010-02-13 NOTE — Miscellaneous (Signed)
Summary: mammogram 12/16/09 - normal  Clinical Lists Changes  Observations: Added new observation of MAMMOGRAM: Assessment: BIRADS 1. Primarily fat replaced.  No worisome finding or significant interval change.    (12/16/2009 9:36)      Prevention & Chronic Care Immunizations   Influenza vaccine: Fluvax 3+  (10/03/2007)   Influenza vaccine deferral: Not indicated  (12/02/2009)   Influenza vaccine due: 09/13/2010    Tetanus booster: 03/15/2008: given   Tetanus booster due: 03/16/2018    Pneumococcal vaccine: Historical  (10/12/2001)   Pneumococcal vaccine due: None  Colorectal Screening   Hemoccult: not indicated  (03/16/2008)   Hemoccult due: Not Indicated    Colonoscopy: hyperplastic polyp  (05/10/2007)   Colonoscopy due: 05/09/2017  Other Screening   Pap smear: NEGATIVE FOR INTRAEPITHELIAL LESIONS OR MALIGNANCY.  (09/04/2009)   Pap smear due: Not Indicated    Mammogram: Assessment: BIRADS 1. Primarily fat replaced.  No worisome finding or significant interval change.     (12/16/2009)   Mammogram due: 12/13/2009   Smoking status: current  (12/02/2009)   Smoking cessation counseling: yes  (03/15/2008)  Diabetes Mellitus   HgbA1C: 6.5  (12/02/2009)   Hemoglobin A1C due: 04/12/2009    Eye exam: No diabetic retinopathy.   Per report, "pressures okay"  (08/21/2008)   Eye exam due: 08/2009    Foot exam: yes  (12/02/2009)   Foot exam action/deferral: Do today   High risk foot: Not documented   Foot care education: Not documented   Foot exam due: 03/15/2009    Urine microalbumin/creatinine ratio: Not documented   Urine microalbumin/cr due: 03/15/2009  Lipids   Total Cholesterol: 151  (06/03/2009)   Lipid panel action/deferral: LDL Direct Ordered   LDL: 87  (06/03/2009)   LDL Direct: 74  (10/12/2008)   HDL: 43  (06/03/2009)   Triglycerides: 104  (06/03/2009)   Lipid panel due: 04/24/2009    SGOT (AST): 15  (06/03/2009)   SGPT (ALT): 16  (06/03/2009)  Alkaline phosphatase: 91  (06/03/2009)   Total bilirubin: 0.4  (06/03/2009)  Hypertension   Last Blood Pressure: 134 / 83  (12/02/2009)   Serum creatinine: 0.58  (12/02/2009)   BMP action: Ordered   Serum potassium 3.6  (12/02/2009)   Basic metabolic panel due: 05/17/2009  Self-Management Support :   Personal Goals (by the next clinic visit) :     Personal A1C goal: 7  (10/12/2008)     Personal blood pressure goal: 130/80  (10/12/2008)     Personal LDL goal: 100  (10/12/2008)    Diabetes self-management support: Written self-care plan, Education handout  (10/12/2008)    Diabetes self-management support not done because: Good outcomes  (12/25/2008)    Hypertension self-management support: Written self-care plan, Education handout  (12/02/2009)    Hypertension self-management support not done because: Good outcomes  (09/04/2009)    Lipid self-management support: Lipid monitoring log, Education handout, Written self-care plan  (10/12/2008)     Lipid self-management support not done because: Good outcomes  (12/02/2009)     Mammogram  Procedure date:  12/16/2009  Findings:      Assessment: BIRADS 1. Primarily fat replaced.  No worisome finding or significant interval change.

## 2010-02-13 NOTE — Assessment & Plan Note (Signed)
Summary: DM, lipids, change meds   Vital Signs:  Patient profile:   60 year old female Height:      64.25 inches Weight:      191 pounds BMI:     32.65 Temp:     98.2 degrees F oral Pulse rate:   92 / minute BP sitting:   135 / 80  (left arm)  Vitals Entered By: Tessie Fass CMA (May 29, 2009 9:19 AM) CC: F/U diabetes Is Patient Diabetic? Yes Pain Assessment Patient in pain? no        Primary Care Provider:  Myrka Sylva [place] MD  CC:  F/U diabetes.  History of Present Illness: PT here to follow up with multiple problems.  Now has insurance, so needs to change meds.  Pt reports weight gain.  Eating more.  Concerned A1C might be high because of recent dietary indescretions.  Still smoking but not "going overboard". Does not want to try groups to help with this.   Habits & Providers  Alcohol-Tobacco-Diet     Tobacco Status: current     Tobacco Counseling: to quit use of tobacco products     Cigarette Packs/Day: 0.5  Current Medications (verified): 1)  Daily Multiple Vitamins   Tabs (Multiple Vitamin) .... Take 1 Tablet By Mouth Once A Day 2)  Calcium and Vitamin D .... Take 1 Tablet By Mouth Once A Day 3)  Nasonex 50 Mcg/act  Susp (Mometasone Furoate) .... Two Spray Each Side Daily 4)  Allergy Shots .... Weekly 5)  Lorazepam 0.5 Mg  Tabs (Lorazepam) .... Take 1 Tablet By Mouth Two Times A Day 6)  Protonix 40 Mg  Tbec (Pantoprazole Sodium) .Marland Kitchen.. 1 Once Daily 7)  Carafate 1 Gm/40ml  Susp (Sucralfate) .... One Three Times A Day As Needed 8)  Tramadol Hcl 50 Mg  Tabs (Tramadol Hcl) .... One Three Times A Day 9)  Propranolol Hcl 10 Mg  Tabs (Propranolol Hcl) .... One Every 6 Hours For Elevated Heart Rate 10)  Xyzal 5 Mg  Tabs (Levocetirizine Dihydrochloride) .Marland Kitchen.. 1 At Bedtime 11)  Metformin Hcl 500 Mg  Xr24h-Tab (Metformin Hcl) .... 2 Daily 12)  Nova Max Glucose Test   Strp (Glucose Blood) .... Use As Directed 13)  Bd Ultra-Fine Lancets  Misc (Lancets) .... Use Daily 14)   Baby Aspirin 81 Mg Chew (Aspirin) .... One Daily 15)  Lipitor 10 Mg Tabs (Atorvastatin Calcium) .Marland Kitchen.. 1 Tab By Mouth Daily For Cholesterol 16)  Neurontin 300 Mg Caps (Gabapentin) .Marland Kitchen.. 1 Tab Three Times A Day 17)  B-Complex/b-12  Tabs (B Complex Vitamins) 18)  Diovan 80 Mg Tabs (Valsartan) .Marland Kitchen.. 1 By Mouth Daily For Blood Pressure.  Take Instead of Cozaar)  Allergies: 1)  ! Augmentin 2)  ! Penicillin  Family History: father died at 48 of an MI.  Mother died at 20 with a history of cerebrovascular disease, hypertension, and diabetes.  Two brothers, posture, diabetes, and hypertension Family History of CAD Female 1st degree relative <50 death of child at 1 day, unsure cause Had 2 pregnancy losses one at 4 months, one at 5 months. On disability.  Review of Systems       see hpi  Physical Exam  General:  Well-developed,well-nourished,in no acute distress; alert,appropriate and cooperative throughout examination Extremities:  Mild peeling of medial side L foot.  Otherwise, no lesions.  DP 2+   Impression & Recommendations:  Problem # 1:  DIABETES MELLITUS, TYPE II (ICD-250.00) HgbA1C in good range.  Encouraged  exercise, nutrition.  c/w meds.  >15 minutes spent reviewing meds, writing new rx and changing to generics when possible. Her updated medication list for this problem includes:    Metformin Hcl 500 Mg Xr24h-tab (Metformin hcl) .Marland Kitchen... 2 daily    Baby Aspirin 81 Mg Chew (Aspirin) ..... One daily    Diovan 80 Mg Tabs (Valsartan) .Marland Kitchen... 1 by mouth daily for blood pressure.  take instead of cozaar)    Losartan Potassium 25 Mg Tabs (Losartan potassium) .Marland Kitchen... Take 1 by mouth daily for blood pressure.  Orders: A1C-[facility] (10272) [facility]- Est Level  3 (99213)Future Orders: Comp Met-[facility] ([id number]) ... 05/30/2010 Lipid-[facility] ([id number]) ... 05/30/2010  Problem # 2:  ESSENTIAL HYPERTENSION, BENIGN (ICD-401.1)  Almost at goal.  Increase exercise, watch diet.  Stop smoking. Her updated  medication list for this problem includes:    Propranolol Hcl 10 Mg Tabs (Propranolol hcl) ..... One every 6 hours for elevated heart rate    Diovan 80 Mg Tabs (Valsartan) .Marland Kitchen... 1 by mouth daily for blood pressure.  take instead of cozaar)    Losartan Potassium 25 Mg Tabs (Losartan potassium) .Marland Kitchen... Take 1 by mouth daily for blood pressure.  Orders: [facility]- Est Level  3 (64332)  Problem # 3:  TOBACCO ABUSE (ICD-305.1)  Offered classes - pt declined.  To continue to consider.  Orders: [facility]- Est Level  3 (95188)  Problem # 4:  DYSLIPIDEMIA (ICD-272.4)  Due for labs.  MEds changed due to cost.   The following medications were removed from the medication list:    Lipitor 10 Mg Tabs (Atorvastatin calcium) .Marland Kitchen... 1 tab by mouth daily for cholesterol Her updated medication list for this problem includes:    Simvastatin 20 Mg Tabs (Simvastatin) .Marland Kitchen... Take 1 by mouth every night for cholesterol.  take instead of lipitor  Orders: [facility]- Est Level  3 (99213)Future Orders: Comp Met-[facility] ([id number]) ... 05/30/2010 Lipid-[facility] ([id number]) ... 05/30/2010  Complete Medication List: 1)  Daily Multiple Vitamins Tabs (Multiple vitamin) .... Take 1 tablet by mouth once a day 2)  Calcium and Vitamin D  .... Take 1 tablet by mouth once a day 3)  Nasonex 50 Mcg/act Susp (Mometasone furoate) .... Two spray each side daily 4)  Allergy Shots  .... Weekly 5)  Lorazepam 0.5 Mg Tabs (Lorazepam) .... Take 1 tablet by mouth two times a day 6)  Carafate 1 Gm/83ml Susp (Sucralfate) .... One three times a day as needed 7)  Tramadol Hcl 50 Mg Tabs (Tramadol hcl) .... One three times a day 8)  Propranolol Hcl 10 Mg Tabs (Propranolol hcl) .... One every 6 hours for elevated heart rate 9)  Metformin Hcl 500 Mg Xr24h-tab (Metformin hcl) .... 2 daily 10)  Bd Ultra-fine Lancets Misc (Lancets) .... Use daily 11)  Baby Aspirin 81 Mg Chew (Aspirin) .... One daily 12)  B-complex/b-12 Tabs (B complex vitamins) 13)  Diovan 80 Mg  Tabs (Valsartan) .Marland Kitchen.. 1 by mouth daily for blood pressure.  take instead of cozaar) 14)  Gabapentin 300 Mg Caps (Gabapentin) .Marland Kitchen.. 1 by mouth three times a day 15)  Simvastatin 20 Mg Tabs (Simvastatin) .... Take 1 by mouth every night for cholesterol.  take instead of lipitor 16)  Losartan Potassium 25 Mg Tabs (Losartan potassium) .... Take 1 by mouth daily for blood pressure. 17)  Truetrack Test Strp (Glucose blood) .... Use as directed 18)  Omeprazole 40 Mg Cpdr (Omeprazole) .... Take 1 daily for stomach  Patient Instructions: 1)  You can buy  cetirizine over the counter (10 mg) and take instead of the Xyzal.  2)  Please make an appt for the lab when you are fasting.   Prescriptions: TRUETRACK TEST  STRP (GLUCOSE BLOOD) Use as directed  #3 boxes x 3   Entered and Authorized by:   Cecille Mcclusky [place] MD   Signed by:   Koltin Wehmeyer [place] MD on 05/29/2009   Method used:   Print then Give to Patient   RxID:   [id number] LOSARTAN POTASSIUM 25 MG TABS (LOSARTAN POTASSIUM) Take 1 by mouth daily for blood pressure.  #90 x 3   Entered and Authorized by:   Nakisha Chai [place] MD   Signed by:   Shown Dissinger [place] MD on 05/29/2009   Method used:   Print then Give to Patient   RxID:   [id number] SIMVASTATIN 20 MG TABS (SIMVASTATIN) Take 1 by mouth every night for cholesterol.  Take instead of Lipitor  #90 x 3   Entered and Authorized by:   Shaquill Iseman [place] MD   Signed by:   Shamell Hittle [place] MD on 05/29/2009   Method used:   Print then Give to Patient   RxID:   [id number] GABAPENTIN 300 MG CAPS (GABAPENTIN) 1 by mouth three times a day  #270 x 3   Entered and Authorized by:   Envi Eagleson [place] MD   Signed by:   Coby Shrewsberry [place] MD on 05/29/2009   Method used:   Print then Give to Patient   RxID:   [id number] METFORMIN HCL 500 MG  XR24H-TAB (METFORMIN HCL) 2 daily  #180 x 3   Entered and Authorized by:   Honora Searson [place] MD   Signed by:   Barry Culverhouse [place] MD on 05/29/2009   Method used:   Print then Give to Patient   RxID:    [id number] PROPRANOLOL HCL 10 MG  TABS (PROPRANOLOL HCL) one every 6 hours for elevated heart rate  #60 x 3   Entered and Authorized by:   Dorinne Graeff [place] MD   Signed by:   Lekha Dancer [place] MD on 05/29/2009   Method used:   Print then Give to Patient   RxID:   [id number] TRAMADOL HCL 50 MG  TABS (TRAMADOL HCL) one three times a day  #270 x 3   Entered and Authorized by:   Qusai Kem [place] MD   Signed by:   Zakaiya Lares [place] MD on 05/29/2009   Method used:   Print then Give to Patient   RxID:   [id number] LORAZEPAM 0.5 MG  TABS (LORAZEPAM) Take 1 tablet by mouth two times a day  #60 x 3   Entered and Authorized by:   Tonya Wantz [place] MD   Signed by:   Roseanna Koplin [place] MD on 05/29/2009   Method used:   Print then Give to Patient   RxID:   [id number] OMEPRAZOLE 40 MG CPDR (OMEPRAZOLE) Take 1 daily for stomach Brand medically necessary #90 x 0   Entered and Authorized by:   Aarik Blank [place] MD   Signed by:   Shamonique Battiste [place] MD on 05/29/2009   Method used:   Print then Give to Patient   RxID:   [id number] TRAMADOL HCL 50 MG  TABS (TRAMADOL HCL) one three times a day  #270 x 3   Entered and Authorized by:   Roark Rufo [place] MD   Signed by:   Syriah Delisi [place] MD on 05/29/2009   Method used:   Electronically to        [facility] [address] [phone]* (retail)  [address]       [place], [place]  [address]       Ph: [phone] or [phone]       Fax: [phone]   RxID:   [id number] PROPRANOLOL HCL 10 MG  TABS (PROPRANOLOL HCL) one every 6 hours for elevated heart rate  #60 x 3   Entered and Authorized by:   Juanpablo Ciresi [place] MD   Signed by:   Jagger Beahm [place] MD on 05/29/2009   Method used:   [facility]ronically to        [facility] [address] [phone]* (retail)       [address]       [place], [place]  [address]       Ph: [phone] or [phone]       Fax: [phone]   RxID:   [id number] METFORMIN HCL 500 MG  XR24H-TAB (METFORMIN HCL)  2 daily  #180 x 3   Entered and Authorized by:   Marysol Wellnitz [place] MD   Signed by:   Anevay Campanella [place] MD on 05/29/2009   Method used:   [facility]ron[facility] to        [facility] [address] [phone]* (retail)       [address]       [place], [place]  [address]       Ph: [phone] or [phone]       Fax: [phone]   RxID:   [id number] ERXVQMGQQ TEST  STRP (GLUCOSE BLOOD) Use as directed  #3 boxes x 3   Entered and Authorized by:   Barnard Sharps [place] MD   Signed by:   Kennedi Lizardo [place] MD on 05/29/2009   Method used:   [facility]ronically to        [facility] [address] [phone]* (retail)       [address]       [place], [place]  [address]       Ph: [phone] or [phone]       Fax: [phone]   RxID:   [id number] LOSARTAN POTASSIUM 25 MG TABS (LOSARTAN POTASSIUM) Take 1 by mouth daily for blood pressure.  #90 x 3   Entered and Authorized by:   Denna Fryberger [place] MD   Signed by:   Jalynne Persico [place] MD on 05/29/2009   Method used:   Electronically to        [facility] [address] [phone]* (retail)       [address]       [place], [place]  [address]       Ph: [phone] or [phone]       Fax: [phone]   RxID:   [id number] SIMVASTATIN 20 MG TABS (SIMVASTATIN) Take 1 by mouth every night for cholesterol.  Take instead of Lipitor  #90 x 3   Entered and Authorized by:   Nour Rodrigues [place] MD   Signed by:   Bellami Farrelly [place] MD on 05/29/2009   Method used:   [facility]ronically to        [facility] [address] [phone]* (retail)       [address]       [place], [place]  [address]       Ph: [phone] or [phone]  Fax: [phone]   RxID:   [id number] GABAPENTIN 300 MG CAPS (GABAPENTIN) 1 by mouth three times a day  #270 x 3   Entered and Authorized by:   Lillian Ballester [place] MD   Signed by:   Juwaun Inskeep [place] MD on 05/29/2009   Method used:   Electronically to        [facility] [address] [phone]* (retail)        [address]       [place], [place]  [address]       Ph: [phone] or [phone]       Fax: [phone]   RxID:   [id number]   Laboratory Results   Blood Tests   Date/Time Received: May 29, 2009 9:16 AM  Date/Time Reported: May 29, 2009 9:30 AM   HGBA1C: 6.1%   (Normal Range: Non-Diabetic - 3-6%   Control Diabetic - 6-8%)  Comments: ...........test performed by...........Marland KitchenTerese Door, CMA

## 2010-03-06 ENCOUNTER — Ambulatory Visit (INDEPENDENT_AMBULATORY_CARE_PROVIDER_SITE_OTHER): Payer: Medicare Other | Admitting: Family Medicine

## 2010-03-06 ENCOUNTER — Encounter: Payer: Self-pay | Admitting: Family Medicine

## 2010-03-06 VITALS — BP 155/90 | HR 96 | Temp 98.6°F | Wt 202.6 lb

## 2010-03-06 DIAGNOSIS — E119 Type 2 diabetes mellitus without complications: Secondary | ICD-10-CM

## 2010-03-06 DIAGNOSIS — E663 Overweight: Secondary | ICD-10-CM

## 2010-03-06 DIAGNOSIS — E785 Hyperlipidemia, unspecified: Secondary | ICD-10-CM

## 2010-03-06 DIAGNOSIS — N951 Menopausal and female climacteric states: Secondary | ICD-10-CM

## 2010-03-06 DIAGNOSIS — F17200 Nicotine dependence, unspecified, uncomplicated: Secondary | ICD-10-CM

## 2010-03-06 DIAGNOSIS — I10 Essential (primary) hypertension: Secondary | ICD-10-CM

## 2010-03-06 LAB — POCT GLYCOSYLATED HEMOGLOBIN (HGB A1C): Hemoglobin A1C: 6.9

## 2010-03-06 MED ORDER — LOSARTAN POTASSIUM 25 MG PO TABS: 50.0000 mg | ORAL_TABLET | Freq: Every day | ORAL | Status: DC

## 2010-03-06 MED ORDER — SUCRALFATE 1 GM/10ML PO SUSP: 1.0000 g | Freq: Three times a day (TID) | ORAL | Status: DC | PRN

## 2010-03-06 MED ORDER — MOMETASONE FUROATE 50 MCG/ACT NA SUSP: 2.0000 | Freq: Every day | NASAL | Status: DC

## 2010-03-06 MED ORDER — TRAMADOL HCL 50 MG PO TABS: 50.0000 mg | ORAL_TABLET | Freq: Three times a day (TID) | ORAL | Status: DC

## 2010-03-06 MED ORDER — MECLIZINE HCL 12.5 MG PO TABS: 12.5000 mg | ORAL_TABLET | Freq: Three times a day (TID) | ORAL | Status: DC | PRN

## 2010-03-06 MED ORDER — PROPRANOLOL HCL 10 MG PO TABS: 10.0000 mg | ORAL_TABLET | Freq: Four times a day (QID) | ORAL | Status: DC

## 2010-03-06 MED ORDER — ASPIRIN 81 MG PO CHEW: 81.0000 mg | CHEWABLE_TABLET | Freq: Every day | ORAL | Status: DC

## 2010-03-06 MED ORDER — GABAPENTIN 300 MG PO CAPS: 600.0000 mg | ORAL_CAPSULE | Freq: Three times a day (TID) | ORAL | Status: DC

## 2010-03-06 MED ORDER — SIMVASTATIN 20 MG PO TABS: 20.0000 mg | ORAL_TABLET | Freq: Every day | ORAL | Status: DC

## 2010-03-06 MED ORDER — PANTOPRAZOLE SODIUM 40 MG PO TBEC: 40.0000 mg | DELAYED_RELEASE_TABLET | Freq: Every day | ORAL | Status: DC

## 2010-03-06 MED ORDER — LORAZEPAM 0.5 MG PO TABS: 0.5000 mg | ORAL_TABLET | Freq: Two times a day (BID) | ORAL | Status: DC

## 2010-03-06 MED ORDER — METFORMIN HCL ER (OSM) 1000 MG PO TB24: 1000.0000 mg | ORAL_TABLET | Freq: Every day | ORAL | Status: DC

## 2010-03-06 NOTE — Progress Notes (Deleted)
  Subjective:    Patient ID: [name], female    DOB: 12-Dec-1950, 60 y.o.   MRN: [id number]  HPI    Review of Systems     Objective:   Physical Exam        Assessment & Plan:

## 2010-03-06 NOTE — Assessment & Plan Note (Signed)
Slight increase in HgbA1c, but overall, excellent control.  Foot exam without lesions.  Pt to try to lose some weight.

## 2010-03-06 NOTE — Progress Notes (Signed)
  Subjective:    Patient ID: [name], female    DOB: 1950-03-27, 60 y.o.   MRN: [id number]  HPI  PT here for f/u of diabetes, also with other chronic conditions  Doing well with diabetes.  Max CBG on log=139.  Pt feeling good about control.  Taking meds as prescribed without problems.  No episodes of hypoglycemia.  Losartan increased, and pt is tolerating well.  Checks BP at home, mostly 150s/80s.  Denies chest pain or SOB. Not exercising.  Has gained weight recently.    Hot flashes improved with Black Cohosh - would like to increase to 40 BID.  Taking gabapentin and has stopped venlafaxine because it did not help.      Review of Systems see HPI     Objective:   Physical Exam  Constitutional: She appears well-developed and well-nourished. No distress.       Had hot flush in clinic.  Obese.  Cardiovascular: Normal rate, regular rhythm, normal heart sounds and intact distal pulses.  Exam reveals no gallop and no friction rub.   No murmur heard. Pulmonary/Chest: Effort normal and breath sounds normal. No respiratory distress. She has no wheezes. She has no rales.  Musculoskeletal:       B feet without lesions B.  Sensations intact.    Skin: She is diaphoretic.  She had a hot flush during the visit, causing diaphoresis.        Assessment & Plan:

## 2010-03-06 NOTE — Patient Instructions (Addendum)
It was great to see you. I am happy with your diabetes. I am glad you are still thinking about quitting smoking and about losing weight.  These 2 things will really help your health. Please come back and see me in 3 months, or sooner if you need [place].

## 2010-03-07 NOTE — Assessment & Plan Note (Signed)
Pt still states she will quit.  Declined resources.  Importance of cessation, especially with cardiac risk factors of diabetes, HTN and hyperlipidemia reviewed.  F/u 3 months, sooner prn.

## 2010-03-07 NOTE — Assessment & Plan Note (Signed)
Pt taking 50 mg losartan without problems.  She did not want to start another agent.  Reports she will lose weight and then will be willing to discuss changing meds.  If another agent is required, consider beta blocker given her PSVT.  Reviewed importance of BP control.

## 2010-03-07 NOTE — Assessment & Plan Note (Signed)
Reviewed importance of healthy weight, especially with diabetes.  Pt declined nutrition resources today.  Encouraged to increase exercise.  Pt handout on obesity given.  F/u 3 months, sooner prn

## 2010-03-07 NOTE — Assessment & Plan Note (Signed)
Doing better on [employer].  She would like to increase to 40 mg BID, so will try that.  No relief with venlafaxine.  Continue with gabapentin.  Pt poor candidate for estrogen replacement given multiple CV risk factors.

## 2010-03-07 NOTE — Assessment & Plan Note (Signed)
Good control on current statin.  Due for repeat lipids 5/12.

## 2010-03-26 LAB — POCT CARDIAC MARKERS
Myoglobin, poc: 46.4 ng/mL (ref 12–200)
Troponin i, poc: <0.05 ng/mL (ref 0.00–0.09)

## 2010-03-26 LAB — CBC
Hemoglobin: 15.4 g/dL — ABNORMAL HIGH (ref 12.0–15.0)
MCH: 32.7 pg (ref 26.0–34.0)
MCHC: 34.5 g/dL (ref 30.0–36.0)
RDW: 13.8 % (ref 11.5–15.5)

## 2010-03-26 LAB — COMPREHENSIVE METABOLIC PANEL
AST: 25 U/L (ref 0–37)
CO2: 25 mEq/L (ref 19–32)
Calcium: 9.8 mg/dL (ref 8.4–10.5)
Creatinine, Ser: 0.57 mg/dL (ref 0.4–1.2)
GFR calc Af Amer: >60 mL/min (ref >60)
GFR calc non Af Amer: >60 mL/min (ref >60)
Total Protein: 6.9 g/dL (ref 6.0–8.3)

## 2010-03-26 LAB — DIFFERENTIAL
Eosinophils Relative: 1 % (ref 0–5)
Lymphocytes Relative: 58 % — ABNORMAL HIGH (ref 12–46)
Lymphs Abs: 5.8 10*3/uL — ABNORMAL HIGH (ref 0.7–4.0)
Neutrophils Relative %: 36 % — ABNORMAL LOW (ref 43–77)

## 2010-03-27 LAB — GLUCOSE, CAPILLARY: Glucose-Capillary: 165 mg/dL — ABNORMAL HIGH (ref 70–99)

## 2010-03-31 ENCOUNTER — Emergency Department (HOSPITAL_COMMUNITY)
Admission: EM | Admit: 2010-03-31 | Discharge: 2010-03-31 | Disposition: A | Discharge status: Home / Self Care | Payer: Medicare Other | Attending: Emergency Medicine | Admitting: Emergency Medicine

## 2010-03-31 DIAGNOSIS — K219 Gastro-esophageal reflux disease without esophagitis: Secondary | ICD-10-CM | POA: Insufficient documentation

## 2010-03-31 DIAGNOSIS — R079 Chest pain, unspecified: Secondary | ICD-10-CM | POA: Insufficient documentation

## 2010-03-31 DIAGNOSIS — R062 Wheezing: Secondary | ICD-10-CM | POA: Insufficient documentation

## 2010-03-31 DIAGNOSIS — E119 Type 2 diabetes mellitus without complications: Secondary | ICD-10-CM | POA: Insufficient documentation

## 2010-03-31 DIAGNOSIS — M25519 Pain in unspecified shoulder: Secondary | ICD-10-CM | POA: Insufficient documentation

## 2010-03-31 DIAGNOSIS — I4891 Unspecified atrial fibrillation: Secondary | ICD-10-CM | POA: Insufficient documentation

## 2010-03-31 DIAGNOSIS — Z79899 Other long term (current) drug therapy: Secondary | ICD-10-CM | POA: Insufficient documentation

## 2010-03-31 DIAGNOSIS — F411 Generalized anxiety disorder: Secondary | ICD-10-CM | POA: Insufficient documentation

## 2010-03-31 LAB — POCT I-STAT, CHEM 8
BUN: 8 mg/dL (ref 6–23)
Calcium, Ion: 1.18 mmol/L (ref 1.12–1.32)
Glucose, Bld: 130 mg/dL — ABNORMAL HIGH (ref 70–99)
TCO2: 27 mmol/L (ref 0–100)

## 2010-03-31 LAB — POCT CARDIAC MARKERS

## 2010-04-01 LAB — CBC
HCT: 41.6 % (ref 36.0–46.0)
Hemoglobin: 14.3 g/dL (ref 12.0–15.0)
RBC: 4.3 MIL/uL (ref 3.87–5.11)
RDW: 14.4 % (ref 11.5–15.5)

## 2010-04-01 LAB — COMPREHENSIVE METABOLIC PANEL
ALT: 16 U/L (ref 0–35)
Alkaline Phosphatase: 91 U/L (ref 39–117)
BUN: 13 mg/dL (ref 6–23)
CO2: 26 mEq/L (ref 19–32)
Chloride: 105 mEq/L (ref 96–112)
GFR calc non Af Amer: >60 mL/min (ref >60)
Glucose, Bld: 133 mg/dL — ABNORMAL HIGH (ref 70–99)
Potassium: 4.1 mEq/L (ref 3.5–5.1)
Total Bilirubin: 0.8 mg/dL (ref 0.3–1.2)
Total Protein: 6.6 g/dL (ref 6.0–8.3)

## 2010-04-01 LAB — DIFFERENTIAL
Basophils Absolute: 0 10*3/uL (ref 0.0–0.1)
Basophils Relative: 1 % (ref 0–1)
Eosinophils Absolute: 0.1 10*3/uL (ref 0.0–0.7)
Neutro Abs: 3.6 10*3/uL (ref 1.7–7.7)
Neutrophils Relative %: 48 % (ref 43–77)

## 2010-04-01 LAB — POCT CARDIAC MARKERS: Myoglobin, poc: 49.3 ng/mL (ref 12–200)

## 2010-04-01 LAB — GLUCOSE, CAPILLARY: Glucose-Capillary: 144 mg/dL — ABNORMAL HIGH (ref 70–99)

## 2010-04-01 LAB — LIPASE, BLOOD: Lipase: 83 U/L — ABNORMAL HIGH (ref 11–59)

## 2010-04-14 LAB — COMPREHENSIVE METABOLIC PANEL
ALT: 24 U/L (ref 0–35)
Alkaline Phosphatase: 102 U/L (ref 39–117)
BUN: 11 mg/dL (ref 6–23)
CO2: 24 mEq/L (ref 19–32)
Calcium: 8.9 mg/dL (ref 8.4–10.5)
GFR calc non Af Amer: >60 mL/min (ref >60)
Glucose, Bld: 121 mg/dL — ABNORMAL HIGH (ref 70–99)
Sodium: 138 mEq/L (ref 135–145)
Total Protein: 7.1 g/dL (ref 6.0–8.3)

## 2010-04-14 LAB — BASIC METABOLIC PANEL
BUN: 6 mg/dL (ref 6–23)
Calcium: 8.5 mg/dL (ref 8.4–10.5)
GFR calc non Af Amer: >60 mL/min (ref >60)
Glucose, Bld: 120 mg/dL — ABNORMAL HIGH (ref 70–99)

## 2010-04-14 LAB — CK TOTAL AND CKMB (NOT AT ARMC)
CK, MB: 1.6 ng/mL (ref 0.3–4.0)
Relative Index: INVALID (ref 0.0–2.5)
Total CK: 67 U/L (ref 7–177)

## 2010-04-14 LAB — CARDIAC PANEL(CRET KIN+CKTOT+MB+TROPI)
Relative Index: INVALID (ref 0.0–2.5)
Relative Index: INVALID (ref 0.0–2.5)
Total CK: 77 U/L (ref 7–177)
Troponin I: 0.01 ng/mL (ref 0.00–0.06)

## 2010-04-14 LAB — DIFFERENTIAL
Basophils Relative: 0 % (ref 0–1)
Eosinophils Absolute: 0 10*3/uL (ref 0.0–0.7)
Lymphs Abs: 0.7 10*3/uL (ref 0.7–4.0)
Monocytes Relative: 4 % (ref 3–12)
Neutro Abs: 8.8 10*3/uL — ABNORMAL HIGH (ref 1.7–7.7)
Neutrophils Relative %: 89 % — ABNORMAL HIGH (ref 43–77)

## 2010-04-14 LAB — GLUCOSE, CAPILLARY
Glucose-Capillary: 111 mg/dL — ABNORMAL HIGH (ref 70–99)
Glucose-Capillary: 115 mg/dL — ABNORMAL HIGH (ref 70–99)
Glucose-Capillary: 115 mg/dL — ABNORMAL HIGH (ref 70–99)

## 2010-04-14 LAB — URINE MICROSCOPIC-ADD ON

## 2010-04-14 LAB — URINALYSIS, ROUTINE W REFLEX MICROSCOPIC
Glucose, UA: NEGATIVE mg/dL
Leukocytes, UA: NEGATIVE
Nitrite: NEGATIVE
Specific Gravity, Urine: 1.012 (ref 1.005–1.030)
pH: 7.5 (ref 5.0–8.0)

## 2010-04-14 LAB — CBC
HCT: 46.7 % — ABNORMAL HIGH (ref 36.0–46.0)
Hemoglobin: 16.1 g/dL — ABNORMAL HIGH (ref 12.0–15.0)
MCHC: 34.5 g/dL (ref 30.0–36.0)
Platelets: 237 10*3/uL (ref 150–400)
RBC: 4.85 MIL/uL (ref 3.87–5.11)
RDW: 13.3 % (ref 11.5–15.5)
RDW: 13.8 % (ref 11.5–15.5)

## 2010-04-14 LAB — PROTIME-INR
INR: 0.96 (ref 0.00–1.49)
Prothrombin Time: 12.7 seconds (ref 11.6–15.2)

## 2010-04-14 LAB — LIPASE, BLOOD: Lipase: 36 U/L (ref 11–59)

## 2010-04-14 LAB — TSH: TSH: 0.182 u[IU]/mL — ABNORMAL LOW (ref 0.350–4.500)

## 2010-04-14 LAB — POCT CARDIAC MARKERS: Troponin i, poc: <0.05 ng/mL (ref 0.00–0.09)

## 2010-04-14 LAB — URINE CULTURE: Colony Count: 50000

## 2010-04-22 LAB — POCT CARDIAC MARKERS: Troponin i, poc: <0.05 ng/mL (ref 0.00–0.09)

## 2010-04-23 LAB — GLUCOSE, CAPILLARY: Glucose-Capillary: 148 mg/dL — ABNORMAL HIGH (ref 70–99)

## 2010-04-28 LAB — COMPREHENSIVE METABOLIC PANEL
AST: 20 U/L (ref 0–37)
Albumin: 3.8 g/dL (ref 3.5–5.2)
Calcium: 9.5 mg/dL (ref 8.4–10.5)
Chloride: 107 mEq/L (ref 96–112)
Creatinine, Ser: 0.54 mg/dL (ref 0.4–1.2)
GFR calc Af Amer: >60 mL/min (ref >60)
Total Bilirubin: 0.6 mg/dL (ref 0.3–1.2)

## 2010-04-28 LAB — URINALYSIS, ROUTINE W REFLEX MICROSCOPIC
Hgb urine dipstick: NEGATIVE
Protein, ur: NEGATIVE mg/dL
Urobilinogen, UA: 1 mg/dL (ref 0.0–1.0)

## 2010-04-28 LAB — DIFFERENTIAL
Eosinophils Relative: 1 % (ref 0–5)
Lymphocytes Relative: 53 % — ABNORMAL HIGH (ref 12–46)
Lymphs Abs: 3.6 10*3/uL (ref 0.7–4.0)
Monocytes Absolute: 0.5 10*3/uL (ref 0.1–1.0)
Monocytes Relative: 7 % (ref 3–12)

## 2010-04-28 LAB — CBC
MCV: 94.1 fL (ref 78.0–100.0)
Platelets: 283 10*3/uL (ref 150–400)
WBC: 6.9 10*3/uL (ref 4.0–10.5)

## 2010-04-28 LAB — GLUCOSE, CAPILLARY: Glucose-Capillary: 141 mg/dL — ABNORMAL HIGH (ref 70–99)

## 2010-04-29 LAB — BASIC METABOLIC PANEL
BUN: 8 mg/dL (ref 6–23)
CO2: 25 mEq/L (ref 19–32)
Chloride: 106 mEq/L (ref 96–112)
Creatinine, Ser: 0.62 mg/dL (ref 0.4–1.2)
Glucose, Bld: 108 mg/dL — ABNORMAL HIGH (ref 70–99)

## 2010-04-29 LAB — URINALYSIS, ROUTINE W REFLEX MICROSCOPIC
Hgb urine dipstick: NEGATIVE
Ketones, ur: NEGATIVE mg/dL
Protein, ur: NEGATIVE mg/dL
Urobilinogen, UA: 0.2 mg/dL (ref 0.0–1.0)

## 2010-04-29 LAB — CBC
MCHC: 35.2 g/dL (ref 30.0–36.0)
MCV: 94.5 fL (ref 78.0–100.0)
Platelets: 248 10*3/uL (ref 150–400)

## 2010-04-29 LAB — DIFFERENTIAL
Basophils Relative: 0 % (ref 0–1)
Eosinophils Absolute: 0 10*3/uL (ref 0.0–0.7)
Eosinophils Relative: 1 % (ref 0–5)
Neutrophils Relative %: 41 % — ABNORMAL LOW (ref 43–77)

## 2010-05-26 ENCOUNTER — Other Ambulatory Visit: Payer: Self-pay | Admitting: Family Medicine

## 2010-05-26 NOTE — Telephone Encounter (Signed)
Refill request

## 2010-05-27 NOTE — Assessment & Plan Note (Signed)
[facility]                         ELECTROPHYSIOLOGY OFFICE NOTE   Tammy, Rios                     MRN:          [id number]  DATE:09/09/2007                            DOB:          1950-08-17    [name] returns today for followup.  She is a very pleasant middle-  aged woman with a history of recurrent SVT, who I initially saw back in  February after being referred by Dr. Diona Browner.  At that time, she had  not had much in the way of symptoms of SVT, but just one bad episode  where she had documented SVT at heart rates of 116 beats per minute  which resulted in presentation to the emergency department where  intravenous adenosine terminated her SVT.  The patient has noted that  her symptoms have gradually increased in frequency and severity despite  medical therapy and for this reason she is referred back for  consideration for catheter ablation.  The patient has had no frank  syncope.  She does note that she has been intolerant of beta-blockers  having these cause her severe fatigue and weakness.  Her other  medications include multiple vitamins, allergy shots, lorazepam p.r.n.,  Naprosyn, and metformin 500 mg twice daily.   PHYSICAL EXAMINATION:  GENERAL:  She is a pleasant, well-appearing,  middle-aged woman in no distress.  VITAL SIGNS:  Blood pressure was 118/70, the pulse 96 and regular,  respirations were 18.  The weight was 192 pounds.  NECK:  Revealed no jugular venous distention.  There were no  thyromegaly.  Trachea was midline.  Carotids were 2+ and symmetric.  LUNGS:  Clear bilaterally to auscultation.  No wheezes, rales, or  rhonchi were present.  CARDIAC:  Regular rate and rhythm with normal S1-S2.  There were no  murmurs, rubs, or gallops.  ABDOMEN:  Obese, nontender, and nondistended.  EXTREMITIES:  Demonstrated no cyanosis, clubbing, or edema.  Pulses were  2+ and symmetric.   IMPRESSION:  Recurrent SVT with  inability to tolerate medical therapy.   DISCUSSION:  I have discussed treatment and management with the patient.  The risks, benefits, goals, and expectations of electrophysiologic study  and catheter ablation with the patient's SVT had been discussed with her  and she wishes to proceed.  This will be scheduled at the earliest  possible convenient time.     Doylene Canning. Ladona Ridgel, MD  Electronically Signed    GWT/MedQ  DD: 09/09/2007  DT: 09/10/2007  Job #: [id number]   cc:   Gordy Savers, MD

## 2010-05-27 NOTE — Op Note (Signed)
[name], [name]              ACCOUNT NO.:  [id number]   MEDICAL RECORD NO.:  [id number]          PATIENT TYPE:  OIB   LOCATION:  2041                         FACILITY:  [facility]   PHYSICIAN:  Hillis Range, MD       DATE OF BIRTH:  1950/08/03   DATE OF PROCEDURE:  DATE OF DISCHARGE:                               OPERATIVE REPORT   EP STUDY NOTE   PRIMARY OPERATOR:  Doylene Canning. Ladona Ridgel, MD   FIRST ASSISTANT:  Hillis Range, MD.   PREPROCEDURE DIAGNOSIS:  Supraventricular tachycardia.   POSTPROCEDURE DIAGNOSES:  1. Isthmus-dependent right atrial flutter.  2. Atrioventricular nodal reentrant tachycardia.   PROCEDURES:  1. Diagnostic electrophysiology study.  2. Coronary sinus pacing and recording.  3. Mapping of supraventricular tachycardia.  4. Radiofrequency ablation of supraventricular tachycardia.  5. Isoproterenol infusion.   DESCRIPTION OF PROCEDURE:  Informed written consent was obtained and the  patient was brought to the electrophysiology lab in the fasting state.  She was adequately sedated with intravenous medications as outlined in  the nursing report.  The patient's right neck and groin were prepped and  draped in the usual sterile fashion by the EP lab staff.  Using  percutaneous Seldinger technique, one 6-French hemostasis sheath was  placed in the right internal jugular vein.  A 6-French curved hexapolar  Damato catheter was introduced through the right internal jugular vein  and advanced into the coronary sinus for recording and pacing from this  location.  Two 6-French and one 8-French hemostasis sheaths were placed  in the right common femoral vein.  A 5-French quadripolar Josephson  catheter was introduced through the right common femoral vein and  advanced into the right ventricle for recording and pacing.  A 5-French  __________ catheter was introduced through the right common femoral vein  and advanced into the His bundle location.  The patient presented  to the  electrophysiology lab in normal sinus rhythm.  Her AH interval measured  159 milliseconds with an HV interval of 56 milliseconds.  The RR  interval measured 1008 milliseconds with a P-wave duration of 123  milliseconds, PR interval 172 milliseconds, QRS was 98 milliseconds and  QT interval 406 milliseconds.  Rapid ventricular pacing was then  performed, which revealed midline decremental VA conduction with a VA  Wenckebach cycle length of 270 milliseconds.  Ventricular extrastimulus  testing was then performed, which revealed midline concentric  decremental VA conduction with a ventricular ERP of 570/230  milliseconds.  Atrial extrastimulus testing was then performed, which  revealed an AH jump between 500/320 and 500/310 milliseconds.  No echo  beats or tachycardia were induced.  The AV nodal ERP was 500/290  milliseconds.  Rapid atrial pacing was then performed, which revealed an  AV Wenckebach cycle length of 360 milliseconds.  Isoproterenol was then  infused at 2 mcg per minute and atrial extrastimulus testing was again  performed.  This demonstrated an AV nodal ERP of 600/280 milliseconds.  AV nodal conduction was decremental, however no AH jump or echo beats  were observed.  Tachycardia was not induced.  Rapid atrial  pacing was  again performed down to an AV Wenckebach cycle length of 260  milliseconds with no evidence of PR greater than RR and no tachycardia  induced.  Atrial extrastimulus testing was again performed with single  and double extrastimuli down to 400/200 and 400/240/230 milliseconds  with no AH jump, echo beats, or tachycardia induced.  Isoproterenol was  allowed to wash out and rapid atrial pacing was performed down to an AV  Wenckebach cycle length of 330 milliseconds with no evidence of PR  greater than RR.  No tachycardia induced.  Isoproterenol was restarted  at 0.75 mcg per minute.  The pacing was then performed from the high  right atrium down to an AV  Wenckebach cycle length of 290 milliseconds.  Atrial extrastimulus testing was again performed down to 500/200  milliseconds with no AH jump, echo beats, or tachycardia observed.  Rapid atrial pacing was again performed down to the cycle length of 240  milliseconds at which tachycardia was induced with a cycle length of 220  milliseconds.  The surface EKG was consistent with counterclockwise  isthmus-dependent right atrial flutter.  The coronary sinus activation  pattern was consistent with right atrial flutter.  The right ventricular  catheter was removed and in its place a 7-French [employer] II XP 8 mm ablation catheter was advanced into the right atrium.  The ablation catheter was positioned along the cavotricuspid isthmus and  transient entrainment was performed in this location.  The post pacing  interval was equal to the tachycardia cycle length.  Transient  entrainment from the distal coronary sinus revealed a long post pacing  interval.  The patient was therefore felt to have isthmus-dependent  right atrial flutter.  The ablation catheter was positioned along the  usual cavotricuspid isthmus.  A single radiofrequency application was  delivered between the tricuspid valve annulus and the inferior vena  cava.  The tachycardia slowed and then terminated during ablation.  Following ablation, atrial pacing was performed from the coronary sinus.  The stimulus to earliest atrial activation with the ablation catheter  positioned on the other side of the ablation line measured 148  milliseconds.  Differential atrial pacing was then performed from the  low lateral right atrium, which confirmed complete bidirectional isthmus  block.  The ablation catheter was then positioned at the site 8 and  Koch's triangle.  A series of two radiofrequency applications were  delivered with a target temperature of 60 degrees at 50 watts with  accelerated junctional rhythm produced with intact  VA conduction.  Following ablation, rapid atrial pacing was again performed, which  revealed an AV Wenckebach cycle length of 310 milliseconds.  Atrial  extrastimulus testing was again performed, which revealed an AV nodal  ERP of 500/280 milliseconds.  There was no AH jump, echo beats, or  tachycardia induced.  The procedure was therefore considered completed.  All catheters were removed and the sheaths were aspirated and flushed.  The sheaths were removed and hemostasis was assured.  There were no  early apparent complications.   CONCLUSIONS:  1. Normal sinus rhythm upon presentation.  2. Dual AV nodal physiology without inducible tachycardia.  3. Isthmus-dependent right atrial flutter induced with rapid atrial      pacing.  4. Successful radiofrequency ablation of right atrial flutter with      complete bidirectional isthmus block achieved.  5. Successful radiofrequency ablation of the slow AV nodal pathway.  6. No early apparent complications.  Hillis Range, MD  Electronically Signed     JA/MEDQ  D:  09/21/2007  T:  09/22/2007  Job:  [id number]   cc:   Jonelle Sidle, MD  Gordy Savers, MD

## 2010-05-27 NOTE — Consult Note (Signed)
[name], [name]              ACCOUNT NO.:  [id number]   MEDICAL RECORD NO.:  [id number]          PATIENT TYPE:  INP   LOCATION:  1824                         FACILITY:  [facility]   PHYSICIAN:  Jonelle Sidle, MD DATE OF BIRTH:  03-05-1950   DATE OF CONSULTATION:  DATE OF DISCHARGE:                                 CONSULTATION   REQUESTING PHYSICIAN:  Gordy Savers, M.D.   REASON FOR CONSULTATION:  Supraventricular tachycardia.   HISTORY OF PRESENT ILLNESS:  [name] is a pleasant 60 year old woman  with a history of hyperlipidemia, seasonal allergies, and no known  history of cardiovascular disease with previous reassuring Myoview in  2006 as well as previously documented normal ejection fraction.  She is  now being admitted to the hospital from Dr. Vernon Prey office,  through the emergency department, having presented with narrow complex,  supraventricular tachycardia.  She reports that this morning, around  breakfast time, she began to experience a feeling of rapid heart rate,  visually seeing her shirt fluttering, and ultimately feeling rapid  palpitations.  She felt this for actually quite some time and began to  experience a general tingling in her fingers with a sense of pressure in  her chest.  She ultimately presented to Dr. Vernon Prey office, at  which time an electrocardiogram demonstrated supraventricular  tachycardia at 152 beats per minute.  She was subsequently transferred  to the emergency department for further assessment, and we were asked to  see her.   On my examination, the patient was comfortable and in no acute distress.  Complaining of a mild sensation of chest discomfort in the setting of a  continued rapid supraventricular tachycardia between 140-150 beats per  minute.  Her repeat electrocardiogram showed no obvious retrograde P  waves or pseudo R primes, in comparison to an old tracing from 2005 and  felt to be consistent with an AV  nodal re-entrant tachycardia, albeit  somewhat slow for this.  A right antecubital intravenous access was  obtained.  The patient was given 6 mg Adenosine IV followed by a saline  push with successful restoration of sinus rhythm in the 80s.  The  patient tolerated this well.  She was also subsequently given a single  dose of Lopressor 5 mg IV and had systolic blood pressures in the 140s  to the 160s.   In speaking with the patient further, she states that she has had  occasional rapid palpitations over the years, although nothing is  prolonged as this episode.  I do note that she is taking Allegra-D at  times and has also used other allergy medications containing  pseudoephedrine.   ALLERGIES:  AUGMENTIN.   Medications at home include Allegra-D, multivitamin with calcium and  vitamin D, Nasonex, Premarin, Lorazepam 0.5 mg p.o. b.i.d., Protonix 40  mg p.o. daily, Naprosyn 500 mg p.o. b.i.d. p.r.n., Carafate 1 gm p.o.  t.i.d. p.r.n., meclizine 25 mg p.o. nightly, Ultram p.r.n.  She also  uses allergy shots.   PAST MEDICAL HISTORY:  As outlined above.  Also a history of  gastroesophageal reflux disease, pinched nerve  in neck,  osteoarthritis, previous hysterectomy, previous appendectomy.   SOCIAL HISTORY:  Patient lives in [place].  She is married.  Her  husband is present today.  She was recently laid off from work.  She  denies any significant alcohol abuse.  She has a greater than 30-pack-  year history of tobacco use.  Denies any substantial use of caffeine or  other illicit substances.   FAMILY HISTORY:  Patient states that her mother died in her 78s.  No  obvious coronary artery disease.  She did have a pacemaker.  Father  died in his 19s with a history of myocardial infarction.  No siblings  with cardiovascular disease.   REVIEW OF SYSTEMS:  As described above.  No typical exertional chest  pain or breathlessness.  Patient does experience arthralgias at times  and  occasional reflux symptoms.  Otherwise systems are negative.   PHYSICAL EXAMINATION:  Heart rate initially in the 150s, down into the  80s, following conversion from supraventricular tachycardia to sinus  rhythm.  Systolic blood pressure 144-160 over diastolics of 80s-90s.  Respirations are 16-20.  Patient afebrile.  This is an obese woman lying in bed in no acute distress.  HEENT:  Conjunctivae is normal.  Oropharynx is clear.  NECK:  Supple without elevated jugular venous distention or loud bruits.  No thyromegaly is noted.  LUNGS:  Clear without labored breathing.  No wheezing noted.  CARDIAC:  Regular rate and rhythm.  No obvious S3 gallop or loud murmur.  ABDOMEN:  Soft and nontender.  EXTREMITIES:  No significant pitting edema.  Skin is warm and dry.  Distal pulses 1+.  MUSCULOSKELETAL:  No kyphosis is noted.  NEUROPSYCHIATRIC:  Patient is alert and oriented x3.  Affect is  appropriate.   Laboratory data and chest x-ray are all pending at the time of  dictation.   IMPRESSION:  1. Supraventricular tachycardia, prolonged episode since this morning,      now sinus rhythm, status post successful cardioversion with      intravenous Adenosine.  Suspect a re-entrant mechanism tachycardia,      likely atrioventricular nodal reentrant tachycardia.  Patient does      report a history of intermittent palpitations, although this is      certainly her most prolonged episode.  She does use some allergy      medications containing pseudoephedrine.  2. No clear history of obstructive cardiovascular disease.  She has      undergone previous evaluation, including a Myoview in 2003 as well      as 2006 demonstrating no active ischemia with previously documented      normal ejection fraction of 60% by echocardiography in 2003.   RECOMMENDATIONS:  Patient is being admitted for observation overnight by  Dr. Amador Cunas.  Agree with baseline labs, including TSH.  Cardiac  markers will also be  cycled.  Will plan to follow up on chest x-ray and  electrocardiogram.  If her workup is reassuring and her rhythm is  stable, I suspect she can be treated successfully as an outpatient with  intermittent use of p.r.n. propranolol.  She reports the last time she  had palpitations was approximately one year ago.  If she continues to  manifest breakthrough arrhythmias, then an electrophysiology  consultation would be in order.  Would otherwise plan to follow up an  outpatient echocardiogram to reassess cardiac structure and function.  If her cardiac markers are abnormal here, she may need further  evaluation.  At this point, do not necessarily anticipate a repeat  ischemic evaluation.  We did discuss avoiding caffeine and other  stimulants.  Our service will follow with you.      Jonelle Sidle, MD  Electronically Signed    SGM/MEDQ  D:  02/04/2007  T:  02/04/2007  Job:  [id number]

## 2010-05-27 NOTE — Discharge Summary (Signed)
NAMEANNALISSA, [name]              ACCOUNT NO.:  [id number]   MEDICAL RECORD NO.:  [id number]          PATIENT TYPE:  OIB   LOCATION:  2041                         FACILITY:  [facility]   PHYSICIAN:  Doylene Canning. Ladona Ridgel, MD    DATE OF BIRTH:  02/17/1950   DATE OF ADMISSION:  09/21/2007  DATE OF DISCHARGE:  09/22/2007                               DISCHARGE SUMMARY   ALLERGIES:  This patient has allergies to AMPICILLIN, PENICILLIN, and  intolerance of BETA-BLOCKERS.   FINAL DIAGNOSIS:  Discharging day #1 status post electrophysiology study  radiofrequency catheter ablation of atrial flutter.   SECONDARY DIAGNOSES:  1. History of palpitation, feels her heart racing and gets anxiety.  2. Diabetes, diagnosed 1 month ago.  3. Ongoing tobacco habituation.  4. Seasonal allergies.  5. Gastroesophageal reflux disease.   PROCEDURES:  On September 21, 2007, electrophysiology study  radiofrequency catheter ablation of an atrial flutter.  Electrocardiograms are not available to typify whether it is a typical  or atypical atrial flutter.   BRIEF HISTORY:  [name] is a 60 year old female.  She has a history  of recurrent palpitation.  She had been treated with propranolol as  needed when she did have her spells of palpitation.  Her symptoms have  become more frequent and more pronounced, and she is referred back on  September 09, 2007, for consideration of catheter ablation.  The patient  has had no syncope.  She is intolerant of beta-blockers, which cause  severe fatigue and weakness.  Other treatment and management have been  discussed with the patient including radiofrequency catheter ablation.  The patient has accepted the risks and benefits and wished to proceed.   HOSPITAL COURSE:  The patient presents as electively on September 21, 2007.  She underwent electrophysiology study radiofrequency catheter  ablation of an atrial flutter.  Of note, she has a dual AV node  physiology.  The atrial  flutter was inducible and ablatable.  The  patient is discharging on postprocedure day #1 without complications  from the procedure.   DISCHARGE MEDICATIONS:  1. Nasonex 50 mcg spray daily.  2. Allergy shots weekly.  3. Lorazepam 0.5 mg twice daily.  4. Protonix 40 mg daily.  5. Naprosyn 500 mg twice daily as needed.  6. Metformin 500 mg 2 tablets daily at bedtime.  7. Multivitamin daily.  8. Calcium with vitamin D daily.  9. Carafate 1 g in 10 mL suspension taken as needed for reflux.  10.Meclizine 25 mg at bedtime as needed.  11.Xyzal 5 mg at bedtime daily.  12.Propranolol 10 mg to continue as needed.   She follows up with Selena Batten on December 08, 2007, at [facility], to see Dr. Ladona Ridgel on Wednesday, November 02, 2007, at 8:45  a.m.   LABORATORY STUDIES:  This admission were drawn on September 09, 2007.  White cells of 7, hemoglobin 14.8, hematocrit 42, platelets 295.  Sodium  140, potassium 3.5, chloride 105, carbonate 29, glucose 102, BUN is 9,  creatinine will be 0.6, pro time 12, INR 1.0.      [name]  [name], [place]      Doylene Canning. Ladona Ridgel, MD  Electronically Signed    GM/MEDQ  D:  09/22/2007  T:  09/22/2007  Job:  [id number]   cc:   Gordy Savers, MD  Jonelle Sidle, MD

## 2010-05-27 NOTE — Assessment & Plan Note (Signed)
[facility]                         ELECTROPHYSIOLOGY OFFICE NOTE   VAANYA, SHAMBAUGH                     MRN:          [id number]  DATE:03/03/2007                            DOB:          06/22/1950    [name] is referred today by Dr. Simona Huh for evaluation of SVT.   HISTORY OF PRESENT ILLNESS:  The patient is a very pleasant 60 year old  woman with history of hypertension and longstanding tobacco abuse who  presented to emergency room back in January with symptomatic SVT at 160  beats per minute.  She was treated with intravenous adenosine resulting  in prompt termination of her SVT.  The patient is referred day for  additional evaluation and treatment.  The patient states that up until  her hospitalization she had never had any sustained tachy palpitations  in the past, and overall, her health has been quite good.  Her SVT was  associated with shortness of breath and left arm pain, but she did not  have any syncope with this.  She states that it felt like her heart was  about to beat out of her chest.   Her additional past medical history is notable for hypertension.  She  has a history of dizziness in the past.  Her family history is notable  for both parents being deceased, her mother of a stroke and her father  of an MI.   SOCIAL HISTORY:  The patient is presently unemployed.  She drinks  infrequent alcoholic beverages but does smoke approximately half pack of  cigarettes per day.  She has smoked all of her adult life.   Her review of systems is notable for occasional peripheral edema and  constipation and generalized fatigue.  She gives a history of arthritis  and gastroesophageal reflux symptoms.  Otherwise, all systems were  reviewed and found be negative except for some problems with anxiety.   On physical examination, she is a pleasant, middle-aged, obese woman in  no acute distress.  The blood pressure was 132/86, the  pulse was 84 and  regular, respirations were 18.  Weight was 189 pounds.  HEENT:  Normocephalic and atraumatic.  Pupils equal and round.  Oropharynx was moist.  Sclerae anicteric.  NECK:  Revealed no jugular distention.  No thyromegaly.  Trachea was  midline.  LUNGS:  Clear bilaterally to auscultation.  No wheezes, rales or rhonchi  were present.  There was no increased work of breathing.  CARDIOVASCULAR EXAM:  Revealed a regular rate and rhythm with a normal  S1 and S2.  No murmurs, rubs or gallops.  The PMI was not enlarged nor  was it laterally displaced.  ABDOMINAL EXAM:  Was soft, nontender, distended.  There was no  organomegaly.  Bowel sounds were present.  No rebound or guarding.  EXTREMITIES:  Demonstrated no cyanosis, clubbing or edema.  Pedal pulses  were 2+ and symmetric.  NEUROLOGIC EXAM:  Alert and oriented x3 with cranial nerves intact.  Strength was 5/5 and symmetric.   EKG demonstrates sinus rhythm.  Normal axis and intervals.  EKG during  SVT demonstrates a  short RP tachycardia.  This was terminated with  adenosine.   MEDICATIONS:  1. Multiple vitamins and calcium.  2. Nasonex.  3. Toprol XL 25 a day.  4. Protonix 40 a day.  5. Lorazepam 0.5 twice daily.  6. Allergy shots.   IMPRESSION:  1. Single episode of sustained supraventricular tachycardia.  2. Hypertension.  3. Arthritis.  4. Hypertension.   DISCUSSION:  Today, I have asked that Ms. Zettlemoyer discontinue her Toprol  as I think it is contributing to her fatigue and weakness.  The patient  does clearly have a single episode of SVT.  She may not have another one  for a long time.  For this reason, I have asked that she undergo a  period of watchful waiting.  I have not recommended ablation at this  point in time unless she has recurrence of her SVT.  I will plan to see  the patient back in the office in several months.     Doylene Canning. Ladona Ridgel, MD  Electronically Signed    GWT/MedQ  DD: 03/03/2007   DT: 03/04/2007  Job #: [id number]   cc:   Gordy Savers, MD

## 2010-05-27 NOTE — Assessment & Plan Note (Signed)
[facility]                         ELECTROPHYSIOLOGY OFFICE NOTE   XUAN, MATEUS                     MRN:          [id number]  DATE:12/23/2007                            DOB:          July 03, 1950    Ms. Montufar returns today for followup.  She is a very pleasant middle-  aged woman with a history of SVT status post electrophysiology study,  catheter ablation, and flutter as well as slow pathway.  The patient has  hypertension.  She has bronchitis.  She returns today for followup.  Initially after ablation, she complained of palpitations, and these have  largely resolved.  Her blood pressure has not been well controlled.   MEDICINES:  1. Multivitamin.  2. Calcium.  3. Nasonex.  4. Allergy shots.  5. Protonix.  6. Naprosyn p.r.n.  7. Lisinopril 10 a day.  8. Metformin 500 twice daily.   PHYSICAL EXAMINATION:  GENERAL:  She is a pleasant, well-appearing,  middle-aged woman in no distress.  VITAL SIGNS:  Blood pressure is 140/90, the pulse 84 and regular, repeat  blood pressure by me was 130/86, and the respirations are 18.  NECK:  No jugular venous distention.  LUNGS:  Clear bilaterally to auscultation.  No wheezes, rales, or  rhonchi are present.  There is no increased work of breathing.  CARDIOVASCULAR:  Regular rate and rhythm.  Normal S1 and S2.  There is a  soft S4 gallop.  ABDOMEN:  Soft and nontender.  EXTREMITIES:  No edema.   IMPRESSION:  1. Supraventricular tachycardia status post ablation.  2. Hypertension, reasonably well controlled, though not perfectly so.  3. Palpitations following ablation, largely resolved.   DISCUSSION:  Ms. Orama is stable.  Her palpitations appear to be  fairly well controlled.  She will take her beta-blocker on an as-needed  basis.  She will continue with her other medications.  We will see her  back in our office in 6 months.     Doylene Canning. Ladona Ridgel, MD  Electronically Signed   GWT/MedQ   DD: 12/23/2007  DT: 12/23/2007  Job #: [id number]

## 2010-05-27 NOTE — Discharge Summary (Signed)
[name], [name]              ACCOUNT NO.:  [id number]   MEDICAL RECORD NO.:  [id number]          PATIENT TYPE:  INP   LOCATION:  3742                         FACILITY:  [facility]   PHYSICIAN:  Valerie A. Felicity Coyer, MDDATE OF BIRTH:  [birth year]   DATE OF ADMISSION:  02/04/2007  DATE OF DISCHARGE:  02/05/2007                               DISCHARGE SUMMARY   DISCHARGE DIAGNOSES:  1. Paroxysmal SVT (supraventricular tachycardia) now normal sinus      rhythm status post treatment.  See details below.  2. Random hyperglycemia nonfasting, no history of diabetes, outpatient      followup.  3. Dyslipidemia nonfasting 5 p.m. sample, outpatient followup with      primary MD.  4. Tobacco abuse.  5. GERD (gastroesophageal reflux disease).  6. Allergic rhinitis.  7. Anxiety.  8. Postmenopausal on hormone replacement.   DISCHARGE MEDICATIONS:  1. Include Toprol XL 25 mg once daily.  2. Inderal 10 mg p.o. q.3 h p.r.n. chest symptoms for rapid heart      rate.  3. Other medications are as prior to admission and include      multivitamin once daily.  4. Calcium with vitamin D once daily.  5. Nasonex 50 mcg spray daily.  6. Premarin 0.625 mg daily.  7. Allergy shots once weekly.  8. Lorazepam 0.5 mg b.i.d. p.r.n.  9. Protonix 40 mg once daily.  10.Naprosyn 500 mg b.i.d. p.r.n.  11.Carafate 1 gram t.i.d. p.r.n.  12.Allegra D 180 mg p.r.n. daily.  13.Meclizine 25 mg q.4 h p.r.n.  14.Tramadol 50 mg q.4-6 h p.r.n.   DISPOSITION:  The patient is discharged home in medically stable and  improved condition.  She is no longer having chest pressure, shortness  of breath, left arm heaviness or tingling in her hand.   HOSPITAL FOLLOWUP:  1. Is going to be arranged by her primary care physician in      approximately 2 weeks with him, Dr. Amador Cunas, to recheck fasting      lipids and glucose for evaluation and treatment of possible      dyslipidemia and diabetes respectively.  2. Will also  arrange for followup with [facility], prefer EP      Dr. Ladona Ridgel in the next 3-4 weeks.  Will also need an outpatient 2-D      echo according to cardiology's recommendations prior to EP eval.  3. The patient is instructed that if she is not contacted by her      primary MD's office by the end of the day Monday January 26 she is      to call for followup appointment assistance.   HOSPITAL CONSULTATION:  Include [facility], Dr. Diona Browner, at  admission.   HOSPITAL COURSE:  PSVT.  The patient is a 60 year old woman who is  mildly overweight with continued tobacco abuse who presented to her  primary care physician's office complaining of left arm heaviness with  tingling in her fingers associated with palpitations and sweating, also  a mild chest discomfort symptom.  An EKG was performed in the office and  found that she was  in SVT and thus admitted to the hospital for further  cardiology treatment and evaluation.  She was seen by [facility]  service and given adenosine with IV metoprolol which resulted in  restoration of normal sinus rhythm which has remained overnight on  telemetry monitoring.  Cardiac enzyme was negative and there was no  evidence of cardiac ischemia.  CBC was normal.  No anemia or evidence of  infection.  CMET was unremarkable except for glucose of 163 which was at  5 p.m. a random nonfasting level.  The patient has no previous history  of diabetes.  Likewise a cholesterol panel was checked at 5 p.m.  nonfasting and abnormal at 252 with triglycerides of 187, LDL of 171 and  HDL of 44.  So the patient may in fact have early diabetes and  dyslipidemia, as these were nonfasting levels and no history of either  will defer followup and recheck on these to her primary MD.  Regarding  her arrhythmia for continued maintenance of normal sinus followup  cardiology physician recommended continuing Toprol XL 25 mg daily for  low dose beta blockade with p.r.n.  Inderal to be used for recurrent  symptoms.  Dr. Eden Emms also recommended followup with Dr. Ladona Ridgel EP in  the next 3-4 weeks with an outpatient echo to evaluate LV function for  further recommendations on management going forward.  This plan has been  described and discussed with the patient who understands and agrees to  these changes.  We have also discussed her tobacco abuse and encouraged  continued efforts at cessation but she reports increased stress and  anxiety due to loss of job late last year.  All other medical issues are  as previously listed.  No changes were made except for the addition of  Toprol and Inderal as listed.      Valerie A. Felicity Coyer, MD  Electronically Signed     VAL/MEDQ  D:  02/05/2007  T:  02/06/2007  Job:  [id number]

## 2010-05-27 NOTE — Assessment & Plan Note (Signed)
[facility]                                 ON-CALL NOTE   [name], [name]                     MRN:          [id number]  DATE:09/27/2007                            DOB:          02-23-1950    She is the patient of Dr. Lubertha Basque.   [name] had chest discomfort and was evaluated in the [facility] ED  within the past 24 hours and subsequently had a D-dimer after being seen  by Dr. Ladona Ridgel in the office.  The D-dimer was abnormal at 0.86.  I have  been calling [name] throughout the night and finally I was able to  get through to her home phone and advised that she presents to the [facility] ED for a CT of chest to rule out PE.  She wrote down my  instructions and plans to present at the ED tonight.     Nicolasa Ducking, ANP  Electronically Signed    CB/MedQ  DD: 09/27/2007  DT: 09/28/2007  Job #: [id number]

## 2010-05-27 NOTE — Assessment & Plan Note (Signed)
[facility]                         ELECTROPHYSIOLOGY OFFICE NOTE   [name], [name]                     MRN:          [id number]  DATE:11/02/2007                            DOB:          Nov 04, 1950    [name] returns today for followup.  She is a very pleasant, middle-  aged woman with a history of tachy palpitations and documented SVT, who  underwent electrophysiologic study and catheter ablation approximately 6  weeks ago.  She was found to have inducible atrial flutter and also had  evidence of dual AV nodal physiology, though she was did not have  inducible AVRT.  She underwent successful flutter ablation and a slow  pathway modification.  Following this, she had no residual slow pathway  conduction and no inducible SVT.  The patient was seen in the emergency  room several days ago with palpitations.  She has very significant  cardiac awareness.  She feels like her chest is vibrating from time to  time, though she denies any frank tachy palpitations.   CURRENT MEDICATIONS:  1. Multivitamin.  2. Allergy shots.  3. Calcium supplements.  4. Lorazepam.  5. Naprosyn 500 twice a day.  6. Metformin 500 twice a day.  7. Lisinopril 10 a day.   On exam, she is a pleasant, middle-aged woman in no acute distress.  Blood pressure was 126/80, the pulse 88 and regular, respirations were  18, the weight was 187 pounds.  NECK:  No jugular distention.  LUNGS:  Clear bilaterally auscultation.  No wheezes, rales, or rhonchi  are present.  No increased work of breathing.  ABDOMINAL:  Soft, nontender.  EXTREMITIES:  No edema.   IMPRESSION:  1. Supraventricular tachycardia, status post ablation.  2. Recurrent palpitations.  3. Leg cramps, unclear etiology.   PLAN:  We will plan on checking a BMP today to make sure she is not  hypokalemic.  I have given her reassurance regarding her palpitations.  If they persist, however, a cardiac monitor would  be recommended.    Doylene Canning. Ladona Ridgel, MD  Electronically Signed   GWT/MedQ  DD: 11/02/2007  DT: 11/03/2007  Job #: [id number]

## 2010-05-27 NOTE — Consult Note (Signed)
[name], [name]              ACCOUNT NO.:  [id number]   MEDICAL RECORD NO.:  [id number]          PATIENT TYPE:  EMS   LOCATION:  MAJO                         FACILITY:  [facility]   PHYSICIAN:  Darryl D. Prime, MD    DATE OF BIRTH:  May 21, 1950   DATE OF CONSULTATION:  09/21/2007  DATE OF DISCHARGE:  09/21/2007                                 CONSULTATION   REASON FOR CONSULTATION:  Chest pain and palpitations.  The ER physician  was requesting an admission.   CHIEF COMPLAINT:  As above.   CARDIOLOGIST:  Dr. Ladona Ridgel.   PRIMARY CARE PHYSICIAN:  Dr. Amador Cunas.   REQUESTING PHYSICIAN:  ER M.D., Dr. Javier Glazier.   HISTORY OF PRESENT ILLNESS:  Ms. Stivers is a 60 year old female with a  history of supraventricular tachycardia.  She is scheduled to have an  ablation early this morning, 09/21/2007 for this and she notes on  yesterday, 09/20/2007 at approximately 6 p.m. the abrupt onset of chest  pain and tachy-palpitations.  The chest pain was right-sided.  The  patient notes these symptoms are usual for her.  It lasted about 30  minutes and she took 10 mg of propranolol, which relieved the symptoms  completely.  This occurred while she was sitting in a chair and eating.  The patient notes no associated sweats, shortness of breath, dizziness,  light-headedness, or pre-syncope.  The patient notes that she saw her  heart pound out of her chest and her head was pounding, which prompted  her to come to the emergency room.   PAST MEDICAL HISTORY:  1. She has a history of supraventricular tachycardia as above.  2. She has a history of tobacco abuse.  3. History of gastroesophageal reflux disease.  4. History of allergic rhinitis.  5. History of anxiety, not otherwise specified.  6. History of degenerative joint disease.  7. Status post hysterectomy for fibroids.  She still has her ovaries.  8. She has a history of diabetes.   The patient's last stress test of the heart was 3 to 5 years  ago.   ALLERGIES:  She is allergic to AUGMENTIN, which causes hives.   MEDICATIONS:  1. Propranolol 10 mg every six hours as needed.  2. Metformin 1000 mg at night.  She did not take the dose tonight as      instructed.  3. She is on Ativan 0.5 mg p.o. b.i.d.  4. She is on multivitamin p.o. in the morning daily.  5. She is on calcium supplementation.  6. She is on meclizine 25 mg as needed.  7. She is on Naproxen 500 mg as needed for joint pain.  8. She is on Carafate 1 gram three times a day as needed.  9. She is on Protonix 40 mg daily.  10.She is on Nasonex.  11.She is also on Flexeril 10 mg p.r.n., which she rarely takes.   SOCIAL HISTORY:  The patient has a history of tobacco abuse, one pack  per day since the age of 57.  Rare alcohol intake.  No caffeinated  products.   FAMILY HISTORY:  Positive for mother requiring a pacemaker and the  father died at 71 of a myocardial infarction.   REVIEW OF SYSTEMS:  A 14-point review of systems was negative unless  stated above.  The patient's code status is full.   PHYSICAL EXAMINATION:  VITAL SIGNS:  Temperature is 98.5, pulse is 69,  respiratory rate 14, blood pressure 148/90, sats are 99% on room air.  GENERAL:  The patient is an obese female, sitting upright in bed, in no  acute distress.  HEENT:  Normocephalic and atraumatic.  Pupils are equal, round, and  reactive to light.  Extraocular movements are intact.  Conjunctivae are  not pale.  Sclerae are anicteric.  The oropharynx is not dry.  No  posterior pharyngeal lesions.  NECK:  Supple, with no lymphadenopathy or thyromegaly.  No jugular  venous distention.  No carotid bruits.  CARDIOVASCULAR:  Regular rate and rhythm, with murmurs, rubs, or  gallops, normal S1 and S2.  No S3 or S4.  LUNGS:  Clear to auscultation bilaterally.  ABDOMEN:  Obese, soft, nontender, and nondistended, with no  hepatosplenomegaly.  EXTREMITIES:  No cyanosis, clubbing, or edema.  NEUROLOGIC EXAM:   She is alert and oriented times four, with cranial  nerves II through XII grossly intact.  Strength and sensation are  grossly intact.   Chest x-ray showed no acute cardiopulmonary disease.  EKG showed normal  sinus rhythm at a rate of 85 beats per minute.  The patient had a normal  axis.  Her P-R interval was 162 milliseconds, QRS was 98 milliseconds,  QT corrected 429 milliseconds.  She had nonspecific ST-T changes, no  change from prior EKG.   LABORATORY DATA:  White count 9.4, with a hemoglobin of 14.6, hematocrit  42.8, and platelets of 268.  Cardiac markers at 2235 are negative.  The  patient's sodium is 139, with a potassium of 3.5, chloride 107, bicarb  25, BUN 9, creatinine 0.6, with a glucose of 167.   ASSESSMENT AND PLAN:  This is a patient with a history of  supraventricular tachycardia, who has had her symptoms that are usual  for her.  The emergency room physician requested admission and she  refused at this time and then consult was requested after this.  The  patient is scheduled for an ablation today.  Since the symptoms are  usual for her and relieved by propranolol, I doubt this is acute  coronary syndrome.  If we commit to this admission, we will have to rule  out for acute coronary syndrome prior to the procedure.  I feel this is  unlikely to be acute  coronary syndrome and she will be discharged to home to return in the  next few hours for the procedure.  She is reminded not to take  metformin.  She is reminded to be n.p.o.  She has been reminded to  return if any symptoms of this were to return.  She was counseled on  discontinuation of tobacco products in all forms.      Darryl D. Prime, MD  Electronically Signed     DDP/MEDQ  D:  09/21/2007  T:  09/21/2007  Job:  [id number]

## 2010-05-28 ENCOUNTER — Other Ambulatory Visit: Payer: Self-pay | Admitting: Family Medicine

## 2010-05-28 NOTE — Telephone Encounter (Signed)
Refill request

## 2010-05-29 ENCOUNTER — Other Ambulatory Visit: Payer: Self-pay | Admitting: Family Medicine

## 2010-05-29 NOTE — Telephone Encounter (Signed)
Refill request

## 2010-05-30 NOTE — Assessment & Plan Note (Signed)
[facility] OFFICE NOTE   [name], [name]                     MRN:          [id number]  DATE:07/31/2005                            DOB:          March 18, 1950    HISTORY OF PRESENT ILLNESS:  The patient is a 60 year old black female seen  today for a comprehensive exam.  Problems include chronic gastroesophageal  reflux disease.  She has allergic rhinitis.  She has a history of menopausal  syndrome and is followed by Dr. Elana Alm.  Surgical procedures have included  a hysterectomy in 1993 and a remote appendectomy.   REVIEW OF SYSTEMS:  Review of systems this a.m. is unremarkable.  She was  doing quite well today without complaints.  In 2006, she underwent a second  Cardiolite stress test that was normal.  Also, had 2D echocardiogram.  In  2004, had sigmoidoscopy.  Also, in 2004, had neurologic evaluation due to  left sided paresthesias.  Carotid artery Doppler studies were negative at  that time.   FAMILY HISTORY:  Fairly noncontributory.  Father died at 17 of an MI.  Mother died at 20 of a cerebrovascular disease.  She had hypertension and  diabetes.  Two brothers, one with diabetes and hypertension.   PHYSICAL EXAMINATION:  GENERAL APPEARANCE:  Healthy appearing black female  in no acute distress.  VITAL SIGNS:  Blood pressure 120/78.  HEENT:  Clear.  Oropharynx benign.  Dentures in place.  NECK:  No bruits or thyroid enlargement or adenopathy.  CHEST:  Clear.  BREAST:  Negative.  CARDIOVASCULAR:  Normal heart sounds.  No murmurs.  ABDOMEN:  Benign.  No organomegaly.  EXTREMITIES:  Negative with intact peripheral pulses.  No edema.  NEUROLOGICAL:  Negative.   IMPRESSION:  Allergic rhinitis, gastroesophageal reflux disease, menopausal  syndrome.   DISPOSITION:  Medical regimen unchanged.  Cessation of smoking again  encouraged.  Will recheck in one year or p.r.n.        Gordy Savers, MD   PFK/MedQ  DD:  07/31/2005  DT:  07/31/2005  Job #:  [id number]

## 2010-06-03 ENCOUNTER — Ambulatory Visit (INDEPENDENT_AMBULATORY_CARE_PROVIDER_SITE_OTHER): Payer: Medicare Other | Admitting: Family Medicine

## 2010-06-03 ENCOUNTER — Encounter: Payer: Self-pay | Admitting: Family Medicine

## 2010-06-03 DIAGNOSIS — G629 Polyneuropathy, unspecified: Secondary | ICD-10-CM

## 2010-06-03 DIAGNOSIS — E119 Type 2 diabetes mellitus without complications: Secondary | ICD-10-CM

## 2010-06-03 DIAGNOSIS — F17200 Nicotine dependence, unspecified, uncomplicated: Secondary | ICD-10-CM

## 2010-06-03 DIAGNOSIS — E1169 Type 2 diabetes mellitus with other specified complication: Secondary | ICD-10-CM

## 2010-06-03 DIAGNOSIS — E785 Hyperlipidemia, unspecified: Secondary | ICD-10-CM

## 2010-06-03 DIAGNOSIS — G589 Mononeuropathy, unspecified: Secondary | ICD-10-CM

## 2010-06-03 DIAGNOSIS — K219 Gastro-esophageal reflux disease without esophagitis: Secondary | ICD-10-CM

## 2010-06-03 DIAGNOSIS — I10 Essential (primary) hypertension: Secondary | ICD-10-CM

## 2010-06-03 DIAGNOSIS — E663 Overweight: Secondary | ICD-10-CM

## 2010-06-03 LAB — LIPID PANEL
LDL Cholesterol: 75 mg/dL (ref 0–99)
Total CHOL/HDL Ratio: 3.5 Ratio

## 2010-06-03 LAB — HEPATIC FUNCTION PANEL
AST: 19 U/L (ref 0–37)
Albumin: 4.4 g/dL (ref 3.5–5.2)
Bilirubin, Direct: 0.1 mg/dL (ref 0.0–0.3)
Total Bilirubin: 0.5 mg/dL (ref 0.3–1.2)

## 2010-06-03 LAB — POCT GLYCOSYLATED HEMOGLOBIN (HGB A1C): Hemoglobin A1C: 7

## 2010-06-03 LAB — VITAMIN B12: Vitamin B-12: 876 pg/mL (ref 211–911)

## 2010-06-03 MED ORDER — LORAZEPAM 0.5 MG PO TABS: 0.5000 mg | ORAL_TABLET | Freq: Two times a day (BID) | ORAL | Status: DC

## 2010-06-03 MED ORDER — SIMVASTATIN 20 MG PO TABS: 20.0000 mg | ORAL_TABLET | Freq: Every day | ORAL | Status: DC

## 2010-06-03 MED ORDER — GABAPENTIN 300 MG PO CAPS: 600.0000 mg | ORAL_CAPSULE | Freq: Three times a day (TID) | ORAL | Status: DC

## 2010-06-03 MED ORDER — TRAMADOL HCL 50 MG PO TABS: 50.0000 mg | ORAL_TABLET | Freq: Three times a day (TID) | ORAL | Status: DC | PRN

## 2010-06-03 MED ORDER — LOSARTAN POTASSIUM 50 MG PO TABS: 50.0000 mg | ORAL_TABLET | Freq: Every day | ORAL | Status: DC

## 2010-06-03 MED ORDER — METFORMIN HCL ER 500 MG PO TB24: 500.0000 mg | ORAL_TABLET | Freq: Every day | ORAL | Status: DC

## 2010-06-03 MED ORDER — PANTOPRAZOLE SODIUM 40 MG PO TBEC: 40.0000 mg | DELAYED_RELEASE_TABLET | Freq: Every day | ORAL | Status: DC

## 2010-06-03 NOTE — Patient Instructions (Signed)
I am glad you are thinking about quitting smoking.  We have some resources for you - Dr. Paulino Rily can help if you would like to meet with him.  You can also call [phone]. We will check your liver test and your cholesterol today. Please come see in me in 3 months. Please keep track of your indigestion symptoms.  If it is getting worse in 2 weeks, please call me.

## 2010-06-04 ENCOUNTER — Encounter: Payer: Self-pay | Admitting: Family Medicine

## 2010-06-04 NOTE — Assessment & Plan Note (Signed)
Doing well by home glucose monitoring, but Hgb A1 c trending up.  Offered nutrition and diabetic education, but pt declines.  SHe will try to exercise and watch nutrition.  Follow up 3 months, sooner if needed.

## 2010-06-04 NOTE — Assessment & Plan Note (Signed)
Pt with long standing GERD, but concerns of food and liquids and medicines getting stuck is concerning.  Pt to document symptoms for 2 weeks. If she does not improve in that time, or she worsens, then she will need eval with GI.

## 2010-06-04 NOTE — Progress Notes (Signed)
  Subjective:    Patient ID: [name], female    DOB: 02-03-1950, 60 y.o.   MRN: [id number]  HPI 60 yo with type 2 DM here for f/u and several issues:  Has spot on R breast for 2 weeks, was red and tender, had some pus out of it.  No breast lumps.  No fevers, chills.  Getting better without treatment.    Her big toes felt numb last night.  Wonders if it is ok to apply lotion to legs in any direction.  Having more indigestion this is happening a few times a week now.  She also has had this situation in the past, but it got better on its own.  Has tried to decrease greasy foods.  FEels this is worse when she takes multiple meds - the tablets sometimes feel stuck.  Also with problems of some foods and sometimes water getting stuck.  +Pain when things feel stuck.  Denies Weight loss.  Does note frequent stooling.  Needs meds refilled.   BPs have ranged mostly 140s/80s with a few 150s systolices.  Denies chest pain or SOB.  Doing fine with diabetic meds, Her fasting glucose ranges 110s-120s.  Every 1-2 weeks, she has an episode of feeling sugars are low, drinks orange juice and it goes back up.  Does not exercise.      Review of Systems  See HPI     Objective:   Physical Exam  Nursing note and vitals reviewed. Constitutional: She appears well-developed and well-nourished. No distress.  Cardiovascular: Normal rate, regular rhythm and normal heart sounds.  Exam reveals no gallop and no friction rub.   No murmur heard. Pulmonary/Chest: Effort normal and breath sounds normal. No respiratory distress. She has no wheezes. She has no rales.  Abdominal: Soft. Bowel sounds are normal. She exhibits no distension and no mass. There is no tenderness. There is no rebound and no guarding.  Skin: Skin is warm and dry. She is not diaphoretic.          Assessment & Plan:

## 2010-06-04 NOTE — Assessment & Plan Note (Signed)
Pt interested in weight loss, but she feels she can do this on her own.  Declined nutrition services and diabetic education services.

## 2010-06-04 NOTE — Assessment & Plan Note (Signed)
Pt's smoking is a tremendous hazard to her health.  She has been planning on stopping on her own for years.  I strongly encouraged her to meet with Dr. Raymondo Band, but she says she will wait until her birthday and then see how it is going.  Resources provided

## 2010-06-04 NOTE — Assessment & Plan Note (Signed)
Check lipid panel today and LFTs.  Continue with meds.

## 2010-06-04 NOTE — Assessment & Plan Note (Signed)
Not at goal.  Pt has been having problems tolerating antihypertensives, so adjusting at this time could be problematic.  She plans to alter her lifestyle (nutrition, exercise and smoking), which should help with her BP.  Follow up 3 months.

## 2010-06-05 ENCOUNTER — Encounter: Payer: Self-pay | Admitting: Family Medicine

## 2010-06-12 ENCOUNTER — Telehealth: Payer: Self-pay | Admitting: Internal Medicine

## 2010-06-12 ENCOUNTER — Telehealth: Payer: Self-pay | Admitting: Family Medicine

## 2010-06-12 NOTE — Telephone Encounter (Signed)
Patient is going to continue with the Propanolol and call us back if her HR do not keep coming on down

## 2010-06-12 NOTE — Telephone Encounter (Signed)
Patient began monitoring her HR when she noticed her chest pounding.  The rate was up to 101.  She took an Inderal and it went down to the low 90s.  I had her check her BP and HR while I had her on the phone.  The BP was up to 146/94 and HR was down to 86.  I asked her if she has ever had this problem before and she said yes and it was Dr. Ladona Ridgel that she saw who prescribed the Inderal.  She has called his office but he has not called back.  She is not having any other sx such as SOB, CP or dizziness.  Told her I would route this note to Dr. Swaziland and would let her know if she had any advice.

## 2010-06-12 NOTE — Telephone Encounter (Signed)
C/o heart racing up to 102 @ 2:45 pmToday.  Blood pressure 121/80. @ 3 pm today. Then 126/80.

## 2010-06-12 NOTE — Telephone Encounter (Signed)
Pt called to say her pulse is 101 and she took a propanolol at 3:00 and it was lower but has increased since then,  Not sure what to do

## 2010-06-12 NOTE — Telephone Encounter (Signed)
Spoke with pt.  Agree with below.  Pt denies chest pain, SOB, dizziness.  Her BPS have been running 140s/90s, with HR generally in 80s until today's episode.  Reviewed ER precautions.  Pt to monitor BP at home - if she continues to stay in 140s/90s for another week or so, will need to adjust BP meds.  Pt will contact us if it remains elevated.

## 2010-06-16 ENCOUNTER — Telehealth: Payer: Self-pay | Admitting: Internal Medicine

## 2010-06-16 ENCOUNTER — Telehealth: Payer: Self-pay | Admitting: Family Medicine

## 2010-06-16 MED ORDER — HYDROCHLOROTHIAZIDE 12.5 MG PO CAPS: 12.5000 mg | ORAL_CAPSULE | Freq: Every day | ORAL | Status: DC

## 2010-06-16 NOTE — Telephone Encounter (Signed)
Let's add in a bit of hydrochlorothiazide.  I will start with a low dose because we have had trouble with her blood pressure being low before. I will call it in to her pharmacy.  Could you let her know?

## 2010-06-16 NOTE — Telephone Encounter (Signed)
Pt says her bp med needs to be adjusted b/c her bp is running high, says she was told to call.

## 2010-06-16 NOTE — Telephone Encounter (Signed)
Called pt back  On Fri her HR was 111  Had to take Propanolol twice on Sat  Today her HR is 93

## 2010-06-16 NOTE — Telephone Encounter (Signed)
Pt rtn your call

## 2010-06-16 NOTE — Telephone Encounter (Signed)
Reading was149/100 this morning. Patient is on Losartan. Patient states that she is having a hard time controlling her BP.

## 2010-06-16 NOTE — Telephone Encounter (Signed)
Dr. Swaziland, I called patient to inform of medication added in. Patient informed me that she is using [facility] and not the [facility]. [facility] the [facility] just wanted to let you know ---[place]

## 2010-06-17 ENCOUNTER — Telehealth: Payer: Self-pay | Admitting: Internal Medicine

## 2010-06-17 NOTE — Telephone Encounter (Signed)
Pt was told by kelly yesterday that she would call her back today after 2p after talking with dr taylor about her fast heart rate she's had since last week

## 2010-06-18 ENCOUNTER — Telehealth: Payer: Self-pay | Admitting: Internal Medicine

## 2010-06-18 NOTE — Telephone Encounter (Signed)
Pt is returning your call

## 2010-06-18 NOTE — Telephone Encounter (Signed)
Pt's heart racing for 1 week, dizzy in the evening when she lays down -

## 2010-06-18 NOTE — Telephone Encounter (Signed)
Thanks

## 2010-06-19 NOTE — Telephone Encounter (Signed)
LMOM for patient to come to see Dr Ladona Ridgel on 07/01/10 at 1:45pm

## 2010-06-19 NOTE — Telephone Encounter (Signed)
Pt given the info re new appt, however she says she still needs to talk to you

## 2010-06-19 NOTE — Telephone Encounter (Signed)
Pt calling back said kelly was to check with taylor re her problems and call her back-pls call [phone] or [phone]

## 2010-06-25 NOTE — Telephone Encounter (Signed)
Spoke with patient and she is aware of her appointment  She got HCTZ 12.5mg  daily from PCP Dr Swaziland for her BP 168/98  Her HR is still running 96-100 on average but feels regular  Says she can see it through her clothes at times  She takes Propanolol if her HR gets over 100.  She says she is not taking this on a daily basis

## 2010-07-01 ENCOUNTER — Encounter: Payer: Self-pay | Admitting: Internal Medicine

## 2010-07-01 ENCOUNTER — Ambulatory Visit (INDEPENDENT_AMBULATORY_CARE_PROVIDER_SITE_OTHER): Payer: Medicare Other | Admitting: Internal Medicine

## 2010-07-01 VITALS — BP 110/82 | HR 104 | Ht 64.5 in | Wt 202.4 lb

## 2010-07-01 DIAGNOSIS — I10 Essential (primary) hypertension: Secondary | ICD-10-CM

## 2010-07-01 DIAGNOSIS — E663 Overweight: Secondary | ICD-10-CM

## 2010-07-01 DIAGNOSIS — Z136 Encounter for screening for cardiovascular disorders: Secondary | ICD-10-CM

## 2010-07-01 DIAGNOSIS — Z8679 Personal history of other diseases of the circulatory system: Secondary | ICD-10-CM

## 2010-07-01 DIAGNOSIS — R002 Palpitations: Secondary | ICD-10-CM

## 2010-07-01 LAB — CBC WITH DIFFERENTIAL/PLATELET
Basophils Absolute: 0 10*3/uL (ref 0.0–0.1)
Basophils Relative: 0.2 % (ref 0.0–3.0)
Eosinophils Absolute: 0.1 10*3/uL (ref 0.0–0.7)
Hemoglobin: 14.3 g/dL (ref 12.0–15.0)
Lymphocytes Relative: 55 % — ABNORMAL HIGH (ref 12.0–46.0)
Lymphs Abs: 5.3 10*3/uL — ABNORMAL HIGH (ref 0.7–4.0)
MCHC: 34.4 g/dL (ref 30.0–36.0)
MCV: 95.9 fl (ref 78.0–100.0)
Monocytes Absolute: 0.4 10*3/uL (ref 0.1–1.0)
Neutro Abs: 3.8 10*3/uL (ref 1.4–7.7)
RDW: 13.9 % (ref 11.5–14.6)

## 2010-07-01 MED ORDER — ATENOLOL 25 MG PO TABS: 25.0000 mg | ORAL_TABLET | Freq: Every day | ORAL | Status: DC

## 2010-07-01 NOTE — Patient Instructions (Signed)
Your physician recommends that you schedule a follow-up appointment in: 4-6 weeks with Dr Ladona Ridgel  Your physician recommends that you return for lab work today  TSH/CBC  Your physician has recommended you make the following change in your medication: start Atenolol 25mg  daily

## 2010-07-02 ENCOUNTER — Encounter: Payer: Self-pay | Admitting: Internal Medicine

## 2010-07-02 NOTE — Progress Notes (Signed)
HPI Mrs. Meetze returns today for followup. She is a pleasant middle aged woman history of palpitations, hypertension, diabetes, and dyslipidemia. The patient has had increasingly frequent episodes of palpitations which began several weeks ago. She also noted that her blood pressure was high. Her blood pressure subsequently improved but she is continued to be bothered by palpitations and increased heart rate. She returns for evaluation. She has been on beta blockers though she is not taking them regularly. She has not had her thyroid checked in some time nor has she had a CBC. Allergies  Allergen Reactions  . ZOX:[id number]+[id number]+[id number] Acid+Aspartame     REACTION: Unsure - maybe swollen?  Did not agree with her.  . Penicillins     REACTION: Unsure what reaction she has.     Current Outpatient Prescriptions  Medication Sig Dispense Refill  . aspirin 81 MG chewable tablet Chew 1 tablet (81 mg total) by mouth daily.  100 tablet  0  . b complex vitamins tablet Take 1 tablet by mouth daily.        . BD ULTRA-FINE LANCETS lancets by Other route daily. Use as instructed       . Blood Glucose Monitoring Suppl (TRUE2GO BLOOD GLUCOSE MONITOR) W/DEVICE KIT as directed.       . Calcium-Vitamin D (CALCIUM + D PO) Take 1 tablet by mouth daily.        . fish oil-omega-3 fatty acids 1000 MG capsule Take 1 g by mouth daily.        Marland Kitchen gabapentin (NEURONTIN) 300 MG capsule Take 2 capsules (600 mg total) by mouth 3 (three) times daily.  270 capsule  3  . glucose blood (TRUETRACK TEST) test strip 1 each by Other route. Use as instructed       . hydrochlorothiazide (MICROZIDE) 12.5 MG capsule Take 1 capsule (12.5 mg total) by mouth daily.  30 capsule  0  . LORazepam (ATIVAN) 0.5 MG tablet Take 1 tablet (0.5 mg total) by mouth 2 (two) times daily.  60 tablet  3  . losartan (COZAAR) 50 MG tablet Take 1 tablet (50 mg total) by mouth daily.  90 tablet  3  . meclizine (ANTIVERT) 12.5 MG tablet Take 1 tablet  (12.5 mg total) by mouth 3 (three) times daily as needed. if having symptoms.  May repeat in 12 hours later.  30 tablet  1  . metFORMIN (GLUCOPHAGE-XR) 500 MG 24 hr tablet Take 1 tablet (500 mg total) by mouth daily with breakfast. Ignore previous sig.  Take 2 tablets by mouth daily.  60 tablet  12  . mometasone (NASONEX) 50 MCG/ACT nasal spray 2 sprays by Nasal route daily.  17 g  3  . Multiple Vitamin (MULTIVITAMIN) capsule Take 1 capsule by mouth daily.        . pantoprazole (PROTONIX) 40 MG tablet Take 1 tablet (40 mg total) by mouth daily.  90 tablet  2  . propranolol (INDERAL) 10 MG tablet Take 1 tablet (10 mg total) by mouth every 6 (six) hours. Prn For elevated heart rate  90 tablet  1  . simvastatin (ZOCOR) 20 MG tablet Take 1 tablet (20 mg total) by mouth at bedtime.  90 tablet  0  . sucralfate (CARAFATE) 1 GM/10ML suspension Take 10 mLs (1 g total) by mouth 3 (three) times daily as needed.  420 mL  3  . traMADol (ULTRAM) 50 MG tablet Take 1 tablet (50 mg total) by mouth every 8 (eight) hours as needed for  pain.  270 tablet  3  . atenolol (TENORMIN) 25 MG tablet Take 1 tablet (25 mg total) by mouth daily.  30 tablet  11     Past Medical History  Diagnosis Date  . Anxiety   . Cataract   . Diabetes mellitus   . Hyperlipidemia   . Hypertension     ROS:   All systems reviewed and negative except as noted in the HPI.   Past Surgical History  Procedure Date  . Abdominal hysterectomy     For fibroids benign.  Still has ovaries     No family history on file.   History   Social History  . Marital Status: Married    Spouse Name: N/A    Number of Children: N/A  . Years of Education: N/A   Occupational History  . Not on file.   Social History Main Topics  . Smoking status: Current Everyday Smoker -- 0.5 packs/day    Types: Cigarettes  . Smokeless tobacco: Not on file  . Alcohol Use: No  . Drug Use: No  . Sexually Active: Not on file   Other Topics Concern  . Not  on file   Social History Narrative  . No narrative on file     BP 110/82  Pulse 104  Ht 5' 4.5" (1.638 m)  Wt 202 lb 6.4 oz (91.808 kg)  BMI 34.21 kg/m2  Physical Exam:  Well appearing NAD HEENT: Unremarkable Neck:  No JVD, no thyromegally Lymphatics:  No adenopathy Back:  No CVA tenderness Lungs:  Clear HEART:  Regular tachycardia, no murmurs, no rubs, no clicks Abd:  Flat, positive bowel sounds, no organomegally, no rebound, no guarding Ext:  2 plus pulses, no edema, no cyanosis, no clubbing Skin:  No rashes no nodules Neuro:  CN II through XII intact, motor grossly intact  EKG Sinus tachycardia.   Assess/Plan:

## 2010-07-02 NOTE — Assessment & Plan Note (Signed)
Her blood pressure is normal today. She will continue her current medications, maintain a low-sodium diet. She'll continue her beta blocker.

## 2010-07-02 NOTE — Assessment & Plan Note (Signed)
Her symptoms do appear to be due to sinus tachycardia. I've asked her to start taking a beta blocker on a regular basis. Further, will check a TSH and a CBC. Additional followup based on the results of these tests.

## 2010-07-02 NOTE — Assessment & Plan Note (Signed)
Patient is overweight and deconditioned. The importance of regular daily exercise has been made clear to the patient. She states she will try to do better.

## 2010-07-03 ENCOUNTER — Encounter: Payer: Self-pay | Admitting: Family Medicine

## 2010-07-03 ENCOUNTER — Ambulatory Visit (INDEPENDENT_AMBULATORY_CARE_PROVIDER_SITE_OTHER): Payer: Medicare Other | Admitting: Family Medicine

## 2010-07-03 VITALS — BP 130/80 | HR 92 | Wt 200.0 lb

## 2010-07-03 DIAGNOSIS — N760 Acute vaginitis: Secondary | ICD-10-CM

## 2010-07-03 DIAGNOSIS — N898 Other specified noninflammatory disorders of vagina: Secondary | ICD-10-CM

## 2010-07-03 LAB — POCT WET PREP (WET MOUNT)
Clue Cells Wet Prep HPF POC: NEGATIVE
Yeast Wet Prep HPF POC: NEGATIVE

## 2010-07-03 NOTE — Progress Notes (Signed)
  Subjective:    Patient ID: [name], female    DOB: 22-Mar-1950, 60 y.o.   MRN: [id number]  HPI Vaginal discharge x 2 days, itchy, whiite, mild odor, no abd pain, no vag bleeding, married, one partner, no N/V, feels umcomfortable, no douching or bubble baths, change in soaps, she is diabetic, no recent antibiotics  Wanted to review her labs and letter PCP just sent  She also noted she will have hemorrhoid banding on July 25th  Review of Systems per above     Objective:   Physical Exam  GEN- NAD, alert and oriented  GU- normal external genitalia, erythema of canal-white and yellow discharge, moderate amount, mininmal odor, ovaries not felt, no masses in region, no cervix seen Rectal- external hemorroid tags       Assessment & Plan:   Vaginitis- will treat based on symptoms, in diabetic although A1C is 7 typically yeast populates, with abundance of bacteria will treat with Flagyl first, pt then can use Diflucan if yeast appears, return if symptoms change, she decliend GC/chlamydia

## 2010-07-03 NOTE — Patient Instructions (Signed)
Take the flagyl for bacterial infection If you get a yeast infection take the Diflucan If you have have any pain or this does not improve then let us know.

## 2010-07-14 ENCOUNTER — Other Ambulatory Visit: Payer: Self-pay | Admitting: Family Medicine

## 2010-07-14 NOTE — Telephone Encounter (Signed)
Refill request

## 2010-07-15 ENCOUNTER — Telehealth: Payer: Self-pay | Admitting: Family Medicine

## 2010-07-15 NOTE — Telephone Encounter (Signed)
Pt checking status of refill for hctz, according to med list was sent to pharmacy on 7/2, pt says the pharmacy never received it.

## 2010-07-15 NOTE — Telephone Encounter (Signed)
Called and pharmacy does have RX . Patient notified.

## 2010-07-24 ENCOUNTER — Ambulatory Visit: Payer: Medicare Other | Admitting: Internal Medicine

## 2010-07-25 ENCOUNTER — Other Ambulatory Visit: Payer: Self-pay | Admitting: Family Medicine

## 2010-07-25 NOTE — Telephone Encounter (Signed)
Refill request

## 2010-08-07 ENCOUNTER — Encounter: Payer: Self-pay | Admitting: Internal Medicine

## 2010-08-12 ENCOUNTER — Ambulatory Visit (INDEPENDENT_AMBULATORY_CARE_PROVIDER_SITE_OTHER): Payer: Medicare Other | Admitting: Internal Medicine

## 2010-08-12 ENCOUNTER — Other Ambulatory Visit: Payer: Self-pay | Admitting: Family Medicine

## 2010-08-12 ENCOUNTER — Encounter: Payer: Self-pay | Admitting: Internal Medicine

## 2010-08-12 DIAGNOSIS — I10 Essential (primary) hypertension: Secondary | ICD-10-CM

## 2010-08-12 DIAGNOSIS — Z8679 Personal history of other diseases of the circulatory system: Secondary | ICD-10-CM

## 2010-08-12 NOTE — Assessment & Plan Note (Signed)
HEr symptoms are well controlled. I have asked her continue her beta blocker and walk every day.

## 2010-08-12 NOTE — Patient Instructions (Signed)
Your physician wants you to follow-up in: 6 months with Dr Taylor You will receive a reminder letter in the mail two months in advance. If you don't receive a letter, please call our office to schedule the follow-up appointment.  

## 2010-08-12 NOTE — Telephone Encounter (Signed)
Refill request

## 2010-08-12 NOTE — Progress Notes (Signed)
HPI [name] returns today for followup. She is a pleasant 60 yo woman with a h/o poorly controlled HTN, palpitations with sinus tachycardia. When I saw her last, she was feeling poorly with her blood pressure well controlled. She was placed on daily atenolol and we checked her TSH which was normal. She is improved. Her palpitations and blood pressure are better. Allergies  Allergen Reactions  . OZH:YQMVHQIONGE+XBMWUXLKG+[id number] Acid+Aspartame     REACTION: Unsure - maybe swollen?  Did not agree with her.  . Penicillins     REACTION: Unsure what reaction she has.     Current Outpatient Prescriptions  Medication Sig Dispense Refill  . aspirin 81 MG chewable tablet Chew 1 tablet (81 mg total) by mouth daily.  100 tablet  0  . atenolol (TENORMIN) 25 MG tablet Take 1 tablet (25 mg total) by mouth daily.  30 tablet  11  . b complex vitamins tablet Take 1 tablet by mouth daily.        . BD ULTRA-FINE LANCETS lancets by Other route daily. Use as instructed       . Blood Glucose Monitoring Suppl (TRUE2GO BLOOD GLUCOSE MONITOR) W/DEVICE KIT as directed.       . Calcium-Vitamin D (CALCIUM + D PO) Take 1 tablet by mouth daily.        . fish oil-omega-3 fatty acids 1000 MG capsule Take 1 g by mouth daily.        Marland Kitchen gabapentin (NEURONTIN) 300 MG capsule Take 2 capsules (600 mg total) by mouth 3 (three) times daily.  270 capsule  3  . glucose blood (TRUETRACK TEST) test strip 1 each by Other route. Use as instructed       . LORazepam (ATIVAN) 0.5 MG tablet Take 1 tablet (0.5 mg total) by mouth 2 (two) times daily.  60 tablet  3  . losartan (COZAAR) 50 MG tablet TAKE 1 TABLET BY MOUTH ONCE DAILY FOR BLOOD PRESSURE  30 tablet  3  . meclizine (ANTIVERT) 12.5 MG tablet Take 1 tablet (12.5 mg total) by mouth 3 (three) times daily as needed. if having symptoms.  May repeat in 12 hours later.  30 tablet  1  . metFORMIN (GLUCOPHAGE-XR) 500 MG 24 hr tablet Take 1 tablet (500 mg total) by mouth daily with  breakfast. Ignore previous sig.  Take 2 tablets by mouth daily.  60 tablet  12  . mometasone (NASONEX) 50 MCG/ACT nasal spray 2 sprays by Nasal route daily.  17 g  3  . Multiple Vitamin (MULTIVITAMIN) capsule Take 1 capsule by mouth daily.        . pantoprazole (PROTONIX) 40 MG tablet Take 1 tablet (40 mg total) by mouth daily.  90 tablet  2  . simvastatin (ZOCOR) 20 MG tablet TAKE 1 TABLET AT BEDTIME  90 tablet  0  . sucralfate (CARAFATE) 1 GM/10ML suspension Take 10 mLs (1 g total) by mouth 3 (three) times daily as needed.  420 mL  3  . traMADol (ULTRAM) 50 MG tablet Take 1 tablet (50 mg total) by mouth every 8 (eight) hours as needed for pain.  270 tablet  3  . hydrochlorothiazide (,MICROZIDE/HYDRODIURIL,) 12.5 MG capsule TAKE ONE CAPSULE BY MOUTH EVERY DAY  30 capsule  0     Past Medical History  Diagnosis Date  . Anxiety   . Cataract   . DM2 (diabetes mellitus, type 2)   . Hyperlipidemia   . Hypertension     essential, benign   .  Skin lesion   . Insomnia   . Weakness   . Screening for malignant neoplasm of the cervix   . Tobacco abuse   . Palpitations     hx  . Routine general medical examination at a health care facility   . Dyslipidemia   . Disturbance of skin sensation     facial paresthesia; left  . Menopausal syndrome   . GERD (gastroesophageal reflux disease)   . AR (allergic rhinitis)     ROS:   All systems reviewed and negative except as noted in the HPI.   Past Surgical History  Procedure Date  . Abdominal hysterectomy     For fibroids benign.  Still has ovaries  . Appendectomy   . Dental extractions   . Sigmoidoscopy 2004  . Colonoscopy 2009  . 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 Ladona Ridgel     Family History  Problem Relation Age of Onset  . Coronary artery disease      female, 1st degree relative <50     History   Social History  . Marital Status: Married    Spouse Name: N/A    Number of Children: N/A  . Years of Education: N/A   Occupational  History  . Not on file.   Social History Main Topics  . Smoking status: Current Everyday Smoker -- 0.5 packs/day    Types: Cigarettes  . Smokeless tobacco: Not on file  . Alcohol Use: No  . Drug Use: No  . Sexually Active: Not on file   Other Topics Concern  . Not on file   Social History Narrative   Married, does not get regular exercise. On disability. 2 miscarriages, one at 4 and one at 5 months; death at 1 day, unsure of cause.      BP 110/70  Pulse 84  Ht 5\' 5"  (1.651 m)  Wt 203 lb (92.08 kg)  BMI 33.78 kg/m2  Physical Exam:  [place], but well appearing NAD HEENT: Unremarkable Neck:  No JVD, no thyromegally Lymphatics:  No adenopathy Back:  No CVA tenderness Lungs:  Clear HEART:  Regular rate rhythm, no murmurs, no rubs, no clicks Abd:  soft, positive bowel sounds, no organomegally, no rebound, no guarding Ext:  2 plus pulses, no edema, no cyanosis, no clubbing Skin:  No rashes no nodules Neuro:  CN II through XII intact, motor grossly intact   Assess/Plan:

## 2010-08-12 NOTE — Assessment & Plan Note (Signed)
Her blood pressure is much improved. She will maintain a low sodium diet and increase her physical activity.

## 2010-09-01 ENCOUNTER — Encounter: Payer: Self-pay | Admitting: Family Medicine

## 2010-09-01 ENCOUNTER — Other Ambulatory Visit (HOSPITAL_COMMUNITY)
Admission: RE | Admit: 2010-09-01 | Discharge: 2010-09-01 | Disposition: A | Discharge status: Home / Self Care | Payer: Medicare Other | Source: Ambulatory Visit | Attending: Family Medicine | Admitting: Family Medicine

## 2010-09-01 ENCOUNTER — Ambulatory Visit (INDEPENDENT_AMBULATORY_CARE_PROVIDER_SITE_OTHER): Payer: Medicare Other | Admitting: Family Medicine

## 2010-09-01 VITALS — BP 124/83 | HR 85 | Ht 65.0 in | Wt 202.0 lb

## 2010-09-01 DIAGNOSIS — F17200 Nicotine dependence, unspecified, uncomplicated: Secondary | ICD-10-CM

## 2010-09-01 DIAGNOSIS — Z124 Encounter for screening for malignant neoplasm of cervix: Secondary | ICD-10-CM

## 2010-09-01 DIAGNOSIS — E663 Overweight: Secondary | ICD-10-CM

## 2010-09-01 DIAGNOSIS — I10 Essential (primary) hypertension: Secondary | ICD-10-CM

## 2010-09-01 DIAGNOSIS — E119 Type 2 diabetes mellitus without complications: Secondary | ICD-10-CM

## 2010-09-01 DIAGNOSIS — R002 Palpitations: Secondary | ICD-10-CM

## 2010-09-01 DIAGNOSIS — E785 Hyperlipidemia, unspecified: Secondary | ICD-10-CM

## 2010-09-01 LAB — POCT GLYCOSYLATED HEMOGLOBIN (HGB A1C): Hemoglobin A1C: 7.5

## 2010-09-01 MED ORDER — PANTOPRAZOLE SODIUM 40 MG PO TBEC: 40.0000 mg | DELAYED_RELEASE_TABLET | Freq: Every day | ORAL | Status: DC

## 2010-09-01 MED ORDER — LORAZEPAM 0.5 MG PO TABS: 0.5000 mg | ORAL_TABLET | Freq: Two times a day (BID) | ORAL | Status: DC

## 2010-09-01 MED ORDER — TRAMADOL HCL 50 MG PO TABS: 50.0000 mg | ORAL_TABLET | Freq: Three times a day (TID) | ORAL | Status: DC | PRN

## 2010-09-01 MED ORDER — HYDROCHLOROTHIAZIDE 12.5 MG PO CAPS: 12.5000 mg | ORAL_CAPSULE | ORAL | Status: DC

## 2010-09-01 MED ORDER — MECLIZINE HCL 12.5 MG PO TABS: 12.5000 mg | ORAL_TABLET | Freq: Three times a day (TID) | ORAL | Status: DC | PRN

## 2010-09-01 MED ORDER — SIMVASTATIN 20 MG PO TABS: 20.0000 mg | ORAL_TABLET | Freq: Every day | ORAL | Status: DC

## 2010-09-01 MED ORDER — METFORMIN HCL ER 500 MG PO TB24: 500.0000 mg | ORAL_TABLET | Freq: Every day | ORAL | Status: DC

## 2010-09-01 MED ORDER — LOSARTAN POTASSIUM 50 MG PO TABS: 50.0000 mg | ORAL_TABLET | Freq: Every day | ORAL | Status: DC

## 2010-09-01 MED ORDER — GABAPENTIN 300 MG PO CAPS: 600.0000 mg | ORAL_CAPSULE | Freq: Three times a day (TID) | ORAL | Status: DC

## 2010-09-02 NOTE — Assessment & Plan Note (Signed)
Pt declines intervention.  She plans to exercise and watch nutrition.

## 2010-09-02 NOTE — Progress Notes (Signed)
  Subjective:    Patient ID: [name], female    DOB: [birth year], 60 y.o.   MRN: [id number]  HPIPt presents for physical exam.  She has several concerns, but most have resolved. R knee soreness.  No injury.  Started last week but got better.  Sleeps with pillow between knees.  Able to walk without difficulty.  BV from June resolved, but has questions about where she got it.    Currently seeing surgery for hemorrhoid banding.  Has had 2 of 3 done.  Denies pain.  Needs all meds refilled.  Wants pap today.  States she had "partial" hysterectomy for fibroids and bleeding.  Thinks she has cervix still.  Reports decreased exercise and increased eating.  Thinks this is why sugar is elevated.    Still smoking.  Did not quit on birthday as planned.    Aunt died of several strokes.  Upset about this.    Review of Systems  Denies sexual problems.  Reports her neuropathy is somewhat worse.  +palpitations - sees cards.     Objective:   Physical Exam  Constitutional: She is oriented to person, place, and time. She appears well-developed and well-nourished. No distress.  HENT:  Head: Normocephalic and atraumatic.  Right Ear: External ear normal.  Left Ear: External ear normal.  Mouth/Throat: Oropharynx is clear and moist. No oropharyngeal exudate.  Eyes: Conjunctivae are normal. Right eye exhibits no discharge. Left eye exhibits no discharge. No scleral icterus.  Neck: Normal range of motion. Neck supple. No thyromegaly present.  Cardiovascular: Normal rate, regular rhythm and normal heart sounds.  Exam reveals no gallop and no friction rub.   No murmur heard. Pulmonary/Chest: Effort normal and breath sounds normal. No respiratory distress. She has no wheezes. She has no rales.  Abdominal: Soft. She exhibits no distension and no mass. There is no tenderness. There is no rebound and no guarding.  Genitourinary: Vagina normal.       No cervix.  Bimanual:  No adnexal mass or TTP.    Musculoskeletal:       Feet with DP 2+.  Mild peeling B feet.  O/w no lesions.  Lymphadenopathy:    She has no cervical adenopathy.  Neurological: She is alert and oriented to person, place, and time.  Skin: Skin is warm and dry. No rash noted. She is not diaphoretic. No erythema. No pallor.  Psychiatric: She has a normal mood and affect. Her behavior is normal. Judgment and thought content normal.          Assessment & Plan:

## 2010-09-02 NOTE — Assessment & Plan Note (Signed)
Pt aware of hazards of smoking.  She declines appt with Dr. Raymondo Band.  She asks for 3 more months.

## 2010-09-02 NOTE — Assessment & Plan Note (Signed)
Good control.  Lab up to date.  Continue current regimen.

## 2010-09-02 NOTE — Assessment & Plan Note (Signed)
HGB increasing, now 7.5.  Discussed with pt - she declines meds and diabetes education.  She plans to exercise more and watch nutrition.  F/u 3 months.  Foot exam normal today except some mild peeling.

## 2010-09-02 NOTE — Assessment & Plan Note (Signed)
At goal.  Refill meds today

## 2010-09-05 ENCOUNTER — Other Ambulatory Visit: Payer: Self-pay | Admitting: Family Medicine

## 2010-09-05 NOTE — Telephone Encounter (Signed)
I gave pt a printed script for this when she came in on 8/20.

## 2010-09-05 NOTE — Telephone Encounter (Signed)
Refill request

## 2010-09-13 ENCOUNTER — Other Ambulatory Visit: Payer: Self-pay | Admitting: Family Medicine

## 2010-09-14 NOTE — Telephone Encounter (Signed)
Refill request

## 2010-10-02 LAB — CBC
HCT: 47.5 — ABNORMAL HIGH
Hemoglobin: 16.4 — ABNORMAL HIGH
MCHC: 34.5
MCV: 94.9
RBC: 5.01
RDW: 13.8

## 2010-10-02 LAB — DIFFERENTIAL
Basophils Relative: 1
Eosinophils Absolute: 0.1
Lymphs Abs: 4.6 — ABNORMAL HIGH
Neutro Abs: 3.6
Neutrophils Relative %: 40 — ABNORMAL LOW

## 2010-10-02 LAB — LIPID PANEL
Cholesterol: 252 — ABNORMAL HIGH
LDL Cholesterol: 171 — ABNORMAL HIGH
Total CHOL/HDL Ratio: 5.7
Triglycerides: 187 — ABNORMAL HIGH

## 2010-10-02 LAB — COMPREHENSIVE METABOLIC PANEL
ALT: 23
BUN: 8
CO2: 27
Calcium: 10
GFR calc non Af Amer: >60
Glucose, Bld: 163 — ABNORMAL HIGH
Sodium: 143

## 2010-10-02 LAB — APTT: aPTT: 26

## 2010-10-02 LAB — I-STAT 8, (EC8 V) (CONVERTED LAB)
BUN: 8
Chloride: 105
pCO2, Ven: 41.2 — ABNORMAL LOW
pH, Ven: 7.411 — ABNORMAL HIGH

## 2010-10-02 LAB — PROTIME-INR: INR: 1

## 2010-10-02 LAB — CARDIAC PANEL(CRET KIN+CKTOT+MB+TROPI)
Relative Index: INVALID
Total CK: 65

## 2010-10-03 LAB — I-STAT 8, (EC8 V) (CONVERTED LAB)
Chloride: 107
HCT: 43
Hemoglobin: 14.6
Operator id: 288331
pCO2, Ven: 34.2 — ABNORMAL LOW

## 2010-10-03 LAB — DIFFERENTIAL
Eosinophils Absolute: 0.1
Eosinophils Relative: 1
Lymphs Abs: 4.2 — ABNORMAL HIGH
Monocytes Relative: 4
Neutrophils Relative %: 41 — ABNORMAL LOW

## 2010-10-03 LAB — APTT: aPTT: 27

## 2010-10-03 LAB — CBC
HCT: 40.5
Hemoglobin: 14
MCV: 94.8
RBC: 4.27
WBC: 7.8

## 2010-10-03 LAB — POCT I-STAT CREATININE: Creatinine, Ser: 0.5

## 2010-10-03 LAB — PROTIME-INR: INR: 1

## 2010-10-09 LAB — CBC
MCHC: 34.6
RBC: 4.56
RDW: 13.8

## 2010-10-09 LAB — DIFFERENTIAL
Basophils Absolute: 0
Basophils Relative: 0
Monocytes Relative: 5
Neutro Abs: 3
Neutrophils Relative %: 35 — ABNORMAL LOW

## 2010-10-09 LAB — POCT I-STAT, CHEM 8
Chloride: 102
HCT: 45
Potassium: 3.4 — ABNORMAL LOW
Sodium: 141

## 2010-10-09 LAB — POCT CARDIAC MARKERS
CKMB, poc: <1 — ABNORMAL LOW
Myoglobin, poc: 54.2
Operator id: 192351
Troponin i, poc: <0.05

## 2010-10-10 LAB — POCT I-STAT, CHEM 8
BUN: 8
Calcium, Ion: 1.18
Calcium, Ion: 1.19
Chloride: 104
Creatinine, Ser: 0.6
Glucose, Bld: 136 — ABNORMAL HIGH
Hemoglobin: 14.3
Sodium: 142
TCO2: 25

## 2010-10-10 LAB — CBC
HCT: 40.5
Hemoglobin: 14
MCV: 94.2
Platelets: 276
RDW: 14.2

## 2010-10-10 LAB — DIFFERENTIAL
Eosinophils Absolute: 0.1
Eosinophils Relative: 1
Lymphs Abs: 3.4
Monocytes Absolute: 0.4

## 2010-10-10 LAB — POCT CARDIAC MARKERS: CKMB, poc: 1.2

## 2010-10-13 LAB — POCT I-STAT, CHEM 8
Calcium, Ion: 1.1 — ABNORMAL LOW
Creatinine, Ser: 0.6
Glucose, Bld: 117 — ABNORMAL HIGH
Hemoglobin: 13.9
TCO2: 25

## 2010-10-13 LAB — CBC
Hemoglobin: 14.7
Platelets: 279
RDW: 13.1

## 2010-10-13 LAB — POCT CARDIAC MARKERS
Myoglobin, poc: 54.7
Myoglobin, poc: 69.4
Troponin i, poc: <0.05

## 2010-10-13 LAB — URINALYSIS, ROUTINE W REFLEX MICROSCOPIC
Hgb urine dipstick: NEGATIVE
Specific Gravity, Urine: 1.031 — ABNORMAL HIGH
pH: 6

## 2010-10-13 LAB — BASIC METABOLIC PANEL
Calcium: 9.5
GFR calc Af Amer: >60
GFR calc non Af Amer: >60
Glucose, Bld: 111 — ABNORMAL HIGH
Sodium: 138

## 2010-10-13 LAB — DIFFERENTIAL
Basophils Absolute: 0
Lymphocytes Relative: 56 — ABNORMAL HIGH
Monocytes Absolute: 0.3
Neutro Abs: 2.7

## 2010-10-13 LAB — URINE MICROSCOPIC-ADD ON

## 2010-10-13 LAB — URINE CULTURE

## 2010-10-13 LAB — GLUCOSE, CAPILLARY: Glucose-Capillary: 115 — ABNORMAL HIGH

## 2010-10-14 LAB — POCT CARDIAC MARKERS: Troponin i, poc: <0.05

## 2010-10-14 LAB — POCT I-STAT, CHEM 8
Calcium, Ion: 1.12
Chloride: 108
Creatinine, Ser: 0.7
Glucose, Bld: 106 — ABNORMAL HIGH
HCT: 44

## 2010-10-14 LAB — DIFFERENTIAL
Basophils Relative: 0
Lymphs Abs: 4
Monocytes Absolute: 0.3
Monocytes Relative: 4
Neutro Abs: 3.1

## 2010-10-14 LAB — CBC
Hemoglobin: 14.9
MCHC: 33.8
MCV: 94.8
RBC: 4.65

## 2010-10-15 LAB — DIFFERENTIAL
Eosinophils Relative: 1
Lymphocytes Relative: 52 — ABNORMAL HIGH
Lymphs Abs: 4.9 — ABNORMAL HIGH
Monocytes Absolute: 0.5
Monocytes Relative: 6
Neutro Abs: 3.9

## 2010-10-15 LAB — CBC
HCT: 42.8
Hemoglobin: 14.6
RBC: 4.5
WBC: 9.4

## 2010-10-15 LAB — POCT I-STAT, CHEM 8
BUN: 9
Calcium, Ion: 1.13
Chloride: 107
Creatinine, Ser: 0.6
TCO2: 25

## 2010-10-15 LAB — GLUCOSE, CAPILLARY
Glucose-Capillary: 118 — ABNORMAL HIGH
Glucose-Capillary: 130 — ABNORMAL HIGH
Glucose-Capillary: 140 — ABNORMAL HIGH
Glucose-Capillary: 191 — ABNORMAL HIGH

## 2010-10-18 ENCOUNTER — Other Ambulatory Visit: Payer: Self-pay | Admitting: Family Medicine

## 2010-10-19 NOTE — Telephone Encounter (Signed)
Refill request

## 2010-11-06 ENCOUNTER — Other Ambulatory Visit: Payer: Self-pay | Admitting: Family Medicine

## 2010-11-07 ENCOUNTER — Other Ambulatory Visit: Payer: Self-pay | Admitting: Family Medicine

## 2010-11-07 NOTE — Telephone Encounter (Signed)
Refill request

## 2010-11-09 ENCOUNTER — Other Ambulatory Visit: Payer: Self-pay | Admitting: Family Medicine

## 2010-11-10 NOTE — Telephone Encounter (Signed)
Refill request

## 2010-12-01 ENCOUNTER — Encounter: Payer: Self-pay | Admitting: Family Medicine

## 2010-12-01 ENCOUNTER — Ambulatory Visit (INDEPENDENT_AMBULATORY_CARE_PROVIDER_SITE_OTHER): Payer: Medicare Other | Admitting: Family Medicine

## 2010-12-01 VITALS — BP 133/88 | HR 84 | Temp 99.0°F | Ht 65.0 in | Wt 204.8 lb

## 2010-12-01 DIAGNOSIS — F17200 Nicotine dependence, unspecified, uncomplicated: Secondary | ICD-10-CM

## 2010-12-01 DIAGNOSIS — E119 Type 2 diabetes mellitus without complications: Secondary | ICD-10-CM

## 2010-12-01 DIAGNOSIS — E663 Overweight: Secondary | ICD-10-CM

## 2010-12-01 DIAGNOSIS — R002 Palpitations: Secondary | ICD-10-CM

## 2010-12-01 DIAGNOSIS — I10 Essential (primary) hypertension: Secondary | ICD-10-CM

## 2010-12-01 MED ORDER — ATENOLOL 25 MG PO TABS: 25.0000 mg | ORAL_TABLET | Freq: Every day | ORAL | Status: DC

## 2010-12-01 MED ORDER — PANTOPRAZOLE SODIUM 40 MG PO TBEC: 40.0000 mg | DELAYED_RELEASE_TABLET | Freq: Every day | ORAL | Status: DC

## 2010-12-01 MED ORDER — LOSARTAN POTASSIUM 50 MG PO TABS: 50.0000 mg | ORAL_TABLET | Freq: Every day | ORAL | Status: DC

## 2010-12-01 MED ORDER — LORAZEPAM 0.5 MG PO TABS: 0.5000 mg | ORAL_TABLET | Freq: Two times a day (BID) | ORAL | Status: DC

## 2010-12-01 MED ORDER — MOMETASONE FUROATE 50 MCG/ACT NA SUSP: 2.0000 | Freq: Every day | NASAL | Status: DC

## 2010-12-01 MED ORDER — METFORMIN HCL ER 500 MG PO TB24: 500.0000 mg | ORAL_TABLET | Freq: Every day | ORAL | Status: DC

## 2010-12-01 MED ORDER — ASPIRIN 81 MG PO CHEW: 81.0000 mg | CHEWABLE_TABLET | Freq: Every day | ORAL | Status: DC

## 2010-12-01 MED ORDER — SIMVASTATIN 20 MG PO TABS: 20.0000 mg | ORAL_TABLET | Freq: Every day | ORAL | Status: DC

## 2010-12-01 MED ORDER — SUCRALFATE 1 GM/10ML PO SUSP: 1.0000 g | Freq: Three times a day (TID) | ORAL | Status: DC | PRN

## 2010-12-01 MED ORDER — HYDROCHLOROTHIAZIDE 12.5 MG PO CAPS: 12.5000 mg | ORAL_CAPSULE | ORAL | Status: DC

## 2010-12-01 MED ORDER — TRAMADOL HCL 50 MG PO TABS: 50.0000 mg | ORAL_TABLET | Freq: Three times a day (TID) | ORAL | Status: DC | PRN

## 2010-12-01 MED ORDER — GABAPENTIN 300 MG PO CAPS: 600.0000 mg | ORAL_CAPSULE | Freq: Three times a day (TID) | ORAL | Status: DC

## 2010-12-01 MED ORDER — BLOOD PRESSURE CUFF MISC: Status: DC

## 2010-12-01 NOTE — Patient Instructions (Signed)
If you have more bleeding, please call us or your GI doctor. I think that losing weight and stopping smoking will help the indigestion.   I look forward to seeing you again in 3 months. Let me know if you have any questions before then.

## 2010-12-02 ENCOUNTER — Encounter: Payer: Self-pay | Admitting: Family Medicine

## 2010-12-02 NOTE — Progress Notes (Signed)
  Subjective:    Patient ID: [name], female    DOB: 23-Aug-1950, 60 y.o.   MRN: [id number]  HPI 60 yo with DM2 here for follow up. 1) Blood in stool yesterday - bright red, no pain, no melena.  Does have h/o hemorroids with banding procedure.  No constipation but uses Miralax QOD.  Had colonoscopy done 9 years ago - states it was normal and she was told to have another one in 10 years. 2) would like a home BP cuff.  Does not like using monitors at [facility], etc because she feels that they are dirty and may be inaccurate.  When she feels bad at home, likes to check BP to make sure it is ok. 3) Leg pain from knees to feet.  Pharmacist suggested taking extra gabapentin at noon.  This has helped. 4) Indigestion.  Feels burning.  Occurs daily for past month.  Sometimes feels large pills get stuck.  Food and liquids are fine.  Takes carafate and feels better.  Takes gas-x and feels better.  Eats fried food at times.   5)Smoking 12-14 cigs daily.  Plans to quit cold [place] New Years.  Declines visit with Dr. Raymondo Band.   6) Glucose at home 118-146.  No hypoglycemic episodes.  Takes meds.  No exercise lately. 7) Wants all meds refilled.  Prefers paper rx.    Review of Systems See HPI    Objective:   Physical Exam  Nursing note and vitals reviewed. Constitutional: She appears well-developed and well-nourished. No distress.  Musculoskeletal:       Diabetic foot exam: Mild peeling skin, o/w no lesions. DP 2+. Monofilament sensation intact.   Skin: She is not diaphoretic.          Assessment & Plan:

## 2010-12-02 NOTE — Assessment & Plan Note (Signed)
Repeat BP at goal.  Will write rx for home BP cuff, but emphasized to pt that best thing for BP is to stop smoking.

## 2010-12-02 NOTE — Assessment & Plan Note (Signed)
Reviewed lifestyle changes. 

## 2010-12-02 NOTE — Assessment & Plan Note (Signed)
Hgb A1c slightly improved, but still elevated.  Pt with recent weight gain.  No exercise.  Discussed lifestyle changes to help with glycemic control.  Pt to consider.  No change in meds at this time, but if not at goal next visit, consider medication adjustment.  F/u 3 months.

## 2010-12-02 NOTE — Assessment & Plan Note (Signed)
Pt again declined smoking cessation consult.  Reviewed impact of smoking on her health.  She plans to quit jan 1, and states "I'm not lying to you this time"

## 2011-01-13 ENCOUNTER — Other Ambulatory Visit: Payer: Self-pay | Admitting: Family Medicine

## 2011-01-14 NOTE — Telephone Encounter (Signed)
Refill request

## 2011-02-10 ENCOUNTER — Encounter: Payer: Self-pay | Admitting: Internal Medicine

## 2011-02-13 ENCOUNTER — Ambulatory Visit: Payer: Medicare Other | Admitting: Internal Medicine

## 2011-02-24 ENCOUNTER — Encounter: Payer: Self-pay | Admitting: Internal Medicine

## 2011-02-24 ENCOUNTER — Ambulatory Visit (INDEPENDENT_AMBULATORY_CARE_PROVIDER_SITE_OTHER): Payer: Medicare Other | Admitting: Internal Medicine

## 2011-02-24 DIAGNOSIS — I10 Essential (primary) hypertension: Secondary | ICD-10-CM

## 2011-02-24 DIAGNOSIS — F17200 Nicotine dependence, unspecified, uncomplicated: Secondary | ICD-10-CM

## 2011-02-24 DIAGNOSIS — I4891 Unspecified atrial fibrillation: Secondary | ICD-10-CM

## 2011-02-24 DIAGNOSIS — Z8679 Personal history of other diseases of the circulatory system: Secondary | ICD-10-CM

## 2011-02-24 NOTE — Patient Instructions (Signed)
Your physician wants you to follow-up in: 1 year with Dr. Taylor. You will receive a reminder letter in the mail two months in advance. If you don't receive a letter, please call our office to schedule the follow-up appointment.  

## 2011-02-24 NOTE — Assessment & Plan Note (Signed)
Her blood pressure today is well controlled. She will continue a low-sodium diet. She'll continue her current medical therapy.

## 2011-02-24 NOTE — Progress Notes (Signed)
HPI [name] returns today for followup. She is a pleasant 61 yo with a history of palpitations, hypertension, and dyslipidemia. She is back from visiting family in [place]. She denies chest pain or shortness of breath and has had no recurrent palpitations. Allergies  Allergen Reactions  . WJX:BJYNWGNFAOZ+HYQMVHQIO+NGEXBMWUXL Acid+Aspartame     REACTION: Unsure - maybe swollen?  Did not agree with her.  . Penicillins     REACTION: Unsure what reaction she has.     Current Outpatient Prescriptions  Medication Sig Dispense Refill  . aspirin 81 MG chewable tablet Chew 1 tablet (81 mg total) by mouth daily.  100 tablet  0  . atenolol (TENORMIN) 25 MG tablet Take 1 tablet (25 mg total) by mouth daily.  90 tablet  1  . b complex vitamins tablet Take 1 tablet by mouth daily.        . Blood Glucose Monitoring Suppl (TRUE2GO BLOOD GLUCOSE MONITOR) W/DEVICE KIT as directed.       . Blood Pressure Monitoring (BLOOD PRESSURE CUFF) MISC Use as directed to monitor blood pressure  1 each  0  . Calcium-Vitamin D (CALCIUM + D PO) Take 1 tablet by mouth daily.        . fish oil-omega-3 fatty acids 1000 MG capsule Take 1 g by mouth daily.        Marland Kitchen gabapentin (NEURONTIN) 300 MG capsule Take 600 mg by mouth 3 (three) times daily. Take 2 tablets in the morning, 3 in the afternoon and 2 at night      . hydrochlorothiazide (MICROZIDE) 12.5 MG capsule Take 1 capsule (12.5 mg total) by mouth every morning.  90 capsule  0  . Lancets (ONETOUCH ULTRASOFT) lancets USE AS DIRECTED  100 each  3  . LORazepam (ATIVAN) 0.5 MG tablet Take 1 tablet (0.5 mg total) by mouth 2 (two) times daily.  60 tablet  3  . losartan (COZAAR) 50 MG tablet Take 1 tablet (50 mg total) by mouth daily.  90 tablet  1  . meclizine (ANTIVERT) 12.5 MG tablet Take 1 tablet (12.5 mg total) by mouth 3 (three) times daily as needed. if having symptoms.  May repeat in 12 hours later.  30 tablet  1  . metFORMIN (GLUCOPHAGE-XR) 500 MG 24 hr tablet Take  1 tablet (500 mg total) by mouth daily with breakfast.  120 tablet  3  . mometasone (NASONEX) 50 MCG/ACT nasal spray Place 2 sprays into the nose daily.  17 g  3  . Multiple Vitamin (MULTIVITAMIN) capsule Take 1 capsule by mouth daily.        . ONE TOUCH ULTRA TEST test strip USE AS DIRECTED  100 each  6  . pantoprazole (PROTONIX) 40 MG tablet Take 1 tablet (40 mg total) by mouth daily.  90 tablet  1  . simvastatin (ZOCOR) 20 MG tablet Take 1 tablet (20 mg total) by mouth at bedtime.  90 tablet  0  . sucralfate (CARAFATE) 1 GM/10ML suspension Take 10 mLs (1 g total) by mouth 3 (three) times daily as needed.  420 mL  3  . traMADol (ULTRAM) 50 MG tablet Take 1 tablet (50 mg total) by mouth every 8 (eight) hours as needed for pain.  270 tablet  3  . DISCONTD: gabapentin (NEURONTIN) 300 MG capsule Take 2 capsules (600 mg total) by mouth 3 (three) times daily. Take 3 at noon  630 capsule  0     Past Medical History  Diagnosis Date  .  Anxiety   . Cataract   . DM2 (diabetes mellitus, type 2)   . Hyperlipidemia   . Hypertension     essential, benign   . Skin lesion   . Insomnia   . Weakness   . Screening for malignant neoplasm of the cervix   . Tobacco abuse   . Palpitations     hx  . Routine general medical examination at a health care facility   . Dyslipidemia   . Disturbance of skin sensation     facial paresthesia; left  . Menopausal syndrome   . GERD (gastroesophageal reflux disease)   . AR (allergic rhinitis)     ROS:   All systems reviewed and negative except as noted in the HPI.   Past Surgical History  Procedure Date  . Abdominal hysterectomy 1990    For fibroids benign.  Still has ovaries  . Appendectomy   . Dental extractions   . Sigmoidoscopy 2004  . Colonoscopy 2009  . 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 Ladona Ridgel     Family History  Problem Relation Age of Onset  . Coronary artery disease      female, 1st degree relative <50  . Heart attack Father 13  .  Hypertension Mother   . Stroke Mother   . Diabetes Mother      History   Social History  . Marital Status: Married    Spouse Name: N/A    Number of Children: N/A  . Years of Education: N/A   Occupational History  . Not on file.   Social History Main Topics  . Smoking status: Current Everyday Smoker -- 1.5 packs/day    Types: Cigarettes  . Smokeless tobacco: Not on file  . Alcohol Use: No  . Drug Use: No  . Sexually Active: Not on file   Other Topics Concern  . Not on file   Social History Narrative   Married, does not get regular exercise. On disability. 2 miscarriages, one at 6 and one at 5 months; death at 1 day, unsure of cause.      BP 134/72  Pulse 84  Wt 92.897 kg (204 lb 12.8 oz)  Physical Exam:  Well appearing middle-aged woman, NAD HEENT: Unremarkable Neck:  No JVD, no thyromegally Lungs:  Clear with no wheezes, rales, rhonchi. HEART:  Regular rate rhythm, no murmurs, no rubs, no clicks Abd:  soft, positive bowel sounds, no organomegally, no rebound, no guarding Ext:  2 plus pulses, no edema, no cyanosis, no clubbing Skin:  No rashes no nodules Neuro:  CN II through XII intact, motor grossly intact  EKG Normal sinus rhythm.  Assess/Plan:

## 2011-02-24 NOTE — Assessment & Plan Note (Signed)
She has been encouraged to stop smoking.  

## 2011-02-24 NOTE — Assessment & Plan Note (Signed)
Her symptoms are currently well controlled. She will continue her beta blocker.

## 2011-03-09 ENCOUNTER — Encounter: Payer: Self-pay | Admitting: Family Medicine

## 2011-03-09 ENCOUNTER — Ambulatory Visit (INDEPENDENT_AMBULATORY_CARE_PROVIDER_SITE_OTHER): Payer: Medicare Other | Admitting: Family Medicine

## 2011-03-09 VITALS — BP 108/68 | HR 82 | Ht 65.6 in | Wt 207.0 lb

## 2011-03-09 DIAGNOSIS — F17200 Nicotine dependence, unspecified, uncomplicated: Secondary | ICD-10-CM

## 2011-03-09 DIAGNOSIS — R002 Palpitations: Secondary | ICD-10-CM

## 2011-03-09 DIAGNOSIS — E119 Type 2 diabetes mellitus without complications: Secondary | ICD-10-CM

## 2011-03-09 DIAGNOSIS — I10 Essential (primary) hypertension: Secondary | ICD-10-CM

## 2011-03-09 DIAGNOSIS — E785 Hyperlipidemia, unspecified: Secondary | ICD-10-CM

## 2011-03-09 LAB — POCT GLYCOSYLATED HEMOGLOBIN (HGB A1C): Hemoglobin A1C: 7.3

## 2011-03-09 MED ORDER — ATENOLOL 25 MG PO TABS: 25.0000 mg | ORAL_TABLET | Freq: Every day | ORAL | Status: DC

## 2011-03-09 MED ORDER — METFORMIN HCL ER 500 MG PO TB24: 500.0000 mg | ORAL_TABLET | Freq: Every day | ORAL | Status: DC

## 2011-03-09 MED ORDER — PANTOPRAZOLE SODIUM 40 MG PO TBEC: 40.0000 mg | DELAYED_RELEASE_TABLET | Freq: Every day | ORAL | Status: DC

## 2011-03-09 MED ORDER — LORAZEPAM 0.5 MG PO TABS: 0.5000 mg | ORAL_TABLET | Freq: Two times a day (BID) | ORAL | Status: DC

## 2011-03-09 MED ORDER — LOSARTAN POTASSIUM 50 MG PO TABS: 50.0000 mg | ORAL_TABLET | Freq: Every day | ORAL | Status: DC

## 2011-03-09 MED ORDER — GABAPENTIN 300 MG PO CAPS: 600.0000 mg | ORAL_CAPSULE | Freq: Three times a day (TID) | ORAL | Status: DC

## 2011-03-09 MED ORDER — SIMVASTATIN 20 MG PO TABS: 20.0000 mg | ORAL_TABLET | Freq: Every day | ORAL | Status: DC

## 2011-03-09 MED ORDER — SUCRALFATE 1 G PO TABS: 1.0000 g | ORAL_TABLET | Freq: Four times a day (QID) | ORAL | Status: DC

## 2011-03-09 MED ORDER — HYDROCHLOROTHIAZIDE 12.5 MG PO CAPS: 12.5000 mg | ORAL_CAPSULE | ORAL | Status: DC

## 2011-03-09 MED ORDER — TRAMADOL HCL 50 MG PO TABS: 50.0000 mg | ORAL_TABLET | Freq: Three times a day (TID) | ORAL | Status: DC | PRN

## 2011-03-09 NOTE — Patient Instructions (Signed)
It was nice to see you today.  If the leg pain gets worse, let us know. I will refill your meds today. Please come back and see me in 3 months.  You can try to make an early morning appt and come fasting so we can check your cholesterol.  You can bring a snack to have after we draw the labs.

## 2011-03-09 NOTE — Progress Notes (Signed)
  Subjective:    Patient ID: [name], female    DOB: 30-Aug-1950, 61 y.o.   MRN: [id number]  HPI 61 yo with DM2 here for f/u 1) DM2 - checking glucose at home.  No lows.  Takes meds.  Endorses dietary indiscretion. Surprised that HgbA1c is stable.  Denies hypoglycemic episodes. 2)Neuropathic pain - much relief with additional gabapentin. 3)Thigh pain - feels like a pulled muscle.  Started 3 days ago on R, then changed 2 days to L  No pain now.  No new exercise, no injury.  Thinks it may be because she turned her donut that she sits on upside down a few weeks ago.   4) Wonders if Zeal energy drink would be a good thing for her. 5) Wants refills (printed) of all meds. 6) Stress- cousin is dying of cancer, and the children are not helping.   7) Still smoking.  Willing to meet with Dr. Raymondo Band.    Review of SystemsNO chest pain, SOB or palpitations.     Objective:   Physical Exam  Nursing note and vitals reviewed. Constitutional: She appears well-developed and well-nourished. No distress.  Cardiovascular: Normal rate, regular rhythm and normal heart sounds.  Exam reveals no gallop and no friction rub.   No murmur heard. Pulmonary/Chest: Effort normal and breath sounds normal. No respiratory distress. She has no wheezes. She has no rales. She exhibits no tenderness.  Musculoskeletal:       B feet with 2+DP.  No lesions.  Skin: She is not diaphoretic.          Assessment & Plan:

## 2011-03-09 NOTE — Assessment & Plan Note (Signed)
Pt willing to meet with Dr. Raymondo Band to discuss cessation.  Pt ed provided on cessation.

## 2011-03-09 NOTE — Assessment & Plan Note (Signed)
Due for lipid panel, CMP 05/2011.  Will check next visit.

## 2011-03-09 NOTE — Assessment & Plan Note (Signed)
Excellent control.  Continue with current meds.  Check BMP next visit.

## 2011-03-09 NOTE — Assessment & Plan Note (Signed)
HgbA1C elevated but stable.  Declines changes in meds or referral for diabetes ed.  She will try to lose weight on her own.  Follow up 3 months.

## 2011-03-16 ENCOUNTER — Ambulatory Visit (INDEPENDENT_AMBULATORY_CARE_PROVIDER_SITE_OTHER): Payer: Medicare Other | Admitting: Family Medicine

## 2011-03-16 ENCOUNTER — Encounter: Payer: Self-pay | Admitting: Family Medicine

## 2011-03-16 DIAGNOSIS — N898 Other specified noninflammatory disorders of vagina: Secondary | ICD-10-CM

## 2011-03-16 DIAGNOSIS — N760 Acute vaginitis: Secondary | ICD-10-CM

## 2011-03-16 LAB — POCT WET PREP (WET MOUNT)

## 2011-03-16 MED ORDER — FLUCONAZOLE 150 MG PO TABS: 150.0000 mg | ORAL_TABLET | Freq: Once | ORAL | Status: AC

## 2011-03-16 NOTE — Assessment & Plan Note (Addendum)
Patient with complaint of vaginal itching and minimal discharge. Sent wet prep today. No yeast seen but will try treating for yeast.  If no better, return for recheck.  Yeast vs contact dermatitis for some atrophic changes.  Denies new soaps, new exposures and tissues look healthy so will treat for yeast first.

## 2011-03-16 NOTE — Patient Instructions (Signed)
Please take some Monistat over-the-counter. If this does not help the itching, you can try Diflucan pill which I prescribed today. If you get worse or no better in 2 weeks please come back and see [place]

## 2011-03-16 NOTE — Progress Notes (Signed)
  Subjective:    Patient ID: [name], female    DOB: Apr 22, 1950, 61 y.o.   MRN: [id number]  HPI she comes in today with 3 days of vaginal itch. She has noticed minimal discharge. She denies odor. She reports an episode of yeast infection several years ago but does not remember what that felt like   Review of Systems Denies abdominal pain, nausea vomiting    Objective:   Physical Exam  Vital signs reviewed General appearance - alert, well appearing, and in no distress and oriented to person, place, and time GYN- thin white discharge present.  Vaginal cuff present.  Mildly tender. Not atrophic in appearance.        Assessment & Plan:

## 2011-03-25 ENCOUNTER — Emergency Department (HOSPITAL_COMMUNITY)
Admission: EM | Admit: 2011-03-25 | Discharge: 2011-03-25 | Disposition: A | Discharge status: Home / Self Care | Payer: Medicare Other | Attending: Emergency Medicine | Admitting: Emergency Medicine

## 2011-03-25 ENCOUNTER — Other Ambulatory Visit: Payer: Self-pay

## 2011-03-25 ENCOUNTER — Emergency Department (HOSPITAL_COMMUNITY): Payer: Medicare Other

## 2011-03-25 ENCOUNTER — Encounter (HOSPITAL_COMMUNITY): Payer: Self-pay | Admitting: Emergency Medicine

## 2011-03-25 DIAGNOSIS — Z7982 Long term (current) use of aspirin: Secondary | ICD-10-CM | POA: Insufficient documentation

## 2011-03-25 DIAGNOSIS — R0789 Other chest pain: Secondary | ICD-10-CM

## 2011-03-25 DIAGNOSIS — R141 Gas pain: Secondary | ICD-10-CM | POA: Insufficient documentation

## 2011-03-25 DIAGNOSIS — E785 Hyperlipidemia, unspecified: Secondary | ICD-10-CM | POA: Insufficient documentation

## 2011-03-25 DIAGNOSIS — I10 Essential (primary) hypertension: Secondary | ICD-10-CM | POA: Insufficient documentation

## 2011-03-25 DIAGNOSIS — K219 Gastro-esophageal reflux disease without esophagitis: Secondary | ICD-10-CM | POA: Insufficient documentation

## 2011-03-25 DIAGNOSIS — R142 Eructation: Secondary | ICD-10-CM | POA: Insufficient documentation

## 2011-03-25 DIAGNOSIS — Z79899 Other long term (current) drug therapy: Secondary | ICD-10-CM | POA: Insufficient documentation

## 2011-03-25 DIAGNOSIS — K3189 Other diseases of stomach and duodenum: Secondary | ICD-10-CM | POA: Insufficient documentation

## 2011-03-25 DIAGNOSIS — E119 Type 2 diabetes mellitus without complications: Secondary | ICD-10-CM | POA: Insufficient documentation

## 2011-03-25 DIAGNOSIS — R1013 Epigastric pain: Secondary | ICD-10-CM | POA: Insufficient documentation

## 2011-03-25 DIAGNOSIS — R071 Chest pain on breathing: Secondary | ICD-10-CM | POA: Insufficient documentation

## 2011-03-25 DIAGNOSIS — R109 Unspecified abdominal pain: Secondary | ICD-10-CM | POA: Insufficient documentation

## 2011-03-25 LAB — POCT I-STAT TROPONIN I
Troponin i, poc: 0.01 ng/mL (ref 0.00–0.08)
Troponin i, poc: 0 ng/mL (ref 0.00–0.08)

## 2011-03-25 LAB — BASIC METABOLIC PANEL
Chloride: 104 mEq/L (ref 96–112)
Creatinine, Ser: 0.48 mg/dL — ABNORMAL LOW (ref 0.50–1.10)
GFR calc Af Amer: >90 mL/min (ref >90)
GFR calc non Af Amer: >90 mL/min (ref >90)

## 2011-03-25 LAB — DIFFERENTIAL
Basophils Absolute: 0 10*3/uL (ref 0.0–0.1)
Basophils Relative: 0 % (ref 0–1)
Eosinophils Absolute: 0.1 10*3/uL (ref 0.0–0.7)
Monocytes Absolute: 0.5 10*3/uL (ref 0.1–1.0)
Neutro Abs: 3.6 10*3/uL (ref 1.7–7.7)
Neutrophils Relative %: 39 % — ABNORMAL LOW (ref 43–77)

## 2011-03-25 LAB — HEPATIC FUNCTION PANEL
Albumin: 3.8 g/dL (ref 3.5–5.2)
Bilirubin, Direct: <0.1 mg/dL (ref 0.0–0.3)
Total Bilirubin: 0.4 mg/dL (ref 0.3–1.2)

## 2011-03-25 LAB — CBC
MCHC: 36.3 g/dL — ABNORMAL HIGH (ref 30.0–36.0)
RDW: 13.2 % (ref 11.5–15.5)

## 2011-03-25 LAB — LIPASE, BLOOD: Lipase: 14 U/L (ref 11–59)

## 2011-03-25 MED ORDER — CYCLOBENZAPRINE HCL 10 MG PO TABS
10.0000 mg | ORAL_TABLET | Freq: Once | ORAL | Status: AC
  Administered 2011-03-25: 10 mg via ORAL
  Filled 2011-03-25 (×2): qty 1

## 2011-03-25 MED ORDER — GI COCKTAIL ~~LOC~~
30.0000 mL | Freq: Once | ORAL | Status: AC
  Administered 2011-03-25: 30 mL via ORAL
  Filled 2011-03-25 (×2): qty 30

## 2011-03-25 MED ORDER — METHOCARBAMOL 500 MG PO TABS: ORAL_TABLET | ORAL | Status: AC

## 2011-03-25 MED ORDER — FAMOTIDINE 20 MG PO TABS
20.0000 mg | ORAL_TABLET | Freq: Once | ORAL | Status: AC
  Administered 2011-03-25: 20 mg via ORAL
  Filled 2011-03-25 (×2): qty 1

## 2011-03-25 NOTE — ED Notes (Signed)
PT. REPORTS LEFT CHEST PAIN " FEELS LIKE GAS" WITH NAUSEA ONSET YESTERDAY , DENIES SOB , NAUSEA OR DIAPHORESIS.

## 2011-03-25 NOTE — ED Provider Notes (Signed)
History     CSN: [id number]  Arrival date & time 03/25/11  4098   First MD Initiated Contact with Patient 03/25/11 [phone]      Chief Complaint  Patient presents with  . Chest Pain    (Consider location/radiation/quality/duration/timing/severity/associated sxs/prior treatment) HPI  Patient relates yesterday a.m. when she got up to go to the bathroom she had some left anterior chest discomfort that she described as a soreness. She related persisted through the day. She also had indigestion and gas and had a lot of belching all day. She states she has some left upper dome oral discomfort that goes into her mid abdomen that she describes as a burning. She states that she at [facility] and had to take Carafate which helped for a short while. She also went to the drugstore and took some Zegerid last night. She states she was able to sleep but when she woke up this morning she still had a soreness in her left chest. She states it hurts when she turns over to her right side. She states if she lays still she does not have any pain. She describes the chest pain as a dullness. She thinks he may have had it before but she's not sure. She states her current pain as a 0 out of time and when she moves over her pain as a 7/10.  PCP Dr. Sara Swaziland with [facility] Cardiologist Dr. Swaziland  Past Medical History  Diagnosis Date  . Anxiety   . Cataract   . DM2 (diabetes mellitus, type 2)   . Hyperlipidemia   . Hypertension     essential, benign   . Skin lesion   . Insomnia   . Weakness   . Screening for malignant neoplasm of the cervix   . Tobacco abuse   . Palpitations     hx  . Routine general medical examination at a health care facility   . Dyslipidemia   . Disturbance of skin sensation     facial paresthesia; left  . Menopausal syndrome   . GERD (gastroesophageal reflux disease)   . AR (allergic rhinitis)     Past Surgical History  Procedure Date  . Abdominal hysterectomy  1990    For fibroids benign.  Still has ovaries  . Appendectomy   . Dental extractions   . Sigmoidoscopy 2004  . Colonoscopy 2009  . 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 Ladona Ridgel    Family History  Problem Relation Age of Onset  . Coronary artery disease      female, 1st degree relative <50  . Heart attack Father 64  . Hypertension Mother   . Stroke Mother   . Diabetes Mother     History  Substance Use Topics  . Smoking status: Current Everyday Smoker -- 1.0 packs/day    Types: Cigarettes  . Smokeless tobacco: Not on file  . Alcohol Use: No   retired  OB History    Grav Para Term Preterm Abortions TAB SAB Ect Mult Living                  Review of Systems  All other systems reviewed and are negative.    Allergies  YNW:GNFAOZHYQMV+HQIONGEXB+MWUXLKGMWN acid+aspartame and Penicillins  Home Medications   Current Outpatient Rx  Name Route Sig Dispense Refill  . ASPIRIN 81 MG PO CHEW Oral Chew 1 tablet (81 mg total) by mouth daily. 100 tablet 0  . ATENOLOL 25 MG PO TABS Oral  Take 1 tablet (25 mg total) by mouth daily. 90 tablet 1  . GABAPENTIN 300 MG PO CAPS Oral Take 2 capsules (600 mg total) by mouth 3 (three) times daily. Take 2 tablets in the morning, 3 in the afternoon and 2 at night 180 capsule 3  . HYDROCHLOROTHIAZIDE 12.5 MG PO CAPS Oral Take 1 capsule (12.5 mg total) by mouth every morning. 90 capsule 0  . LORAZEPAM 0.5 MG PO TABS Oral Take 1 tablet (0.5 mg total) by mouth 2 (two) times daily. 60 tablet 3  . LOSARTAN POTASSIUM 50 MG PO TABS Oral Take 1 tablet (50 mg total) by mouth daily. 90 tablet 1  . METFORMIN HCL 500 MG PO TABS Oral Take 1,000 mg by mouth at bedtime.    . MOMETASONE FUROATE 50 MCG/ACT NA SUSP Nasal Place 2 sprays into the nose daily. 17 g 3  . OLOPATADINE HCL 0.6 % NA SOLN Nasal Place 1 puff into the nose every morning.    Marland Kitchen OMEPRAZOLE-SODIUM BICARBONATE 40-1100 MG PO CAPS Oral Take 1 capsule by mouth daily before breakfast.    . PANTOPRAZOLE SODIUM  40 MG PO TBEC Oral Take 1 tablet (40 mg total) by mouth daily. 90 tablet 1  . SIMVASTATIN 20 MG PO TABS Oral Take 1 tablet (20 mg total) by mouth at bedtime. 90 tablet 0  . SUCRALFATE 1 G PO TABS Oral Take 1 tablet (1 g total) by mouth 4 (four) times daily. 120 tablet 3  . TRAMADOL HCL 50 MG PO TABS Oral Take 1 tablet (50 mg total) by mouth every 8 (eight) hours as needed for pain. 270 tablet 3  . TRUE2GO BLOOD GLUCOSE MONITOR W/DEVICE KIT  as directed.     Marland Kitchen BLOOD PRESSURE CUFF MISC  Use as directed to monitor blood pressure 1 each 0  . ONETOUCH ULTRASOFT LANCETS MISC  USE AS DIRECTED 100 each 3  . MECLIZINE HCL 12.5 MG PO TABS Oral Take 1 tablet (12.5 mg total) by mouth 3 (three) times daily as needed. if having symptoms.  May repeat in 12 hours later. 30 tablet 1  . ONETOUCH ULTRA BLUE VI STRP  USE AS DIRECTED 100 each 6    BP 122/73  Pulse 60  Temp(Src) 97.8 F (36.6 C) (Oral)  Resp 20  SpO2 98%  Vital signs normal    Physical Exam  Constitutional: She is oriented to person, place, and time. She appears well-developed and well-nourished.  Non-toxic appearance. She does not appear ill. No distress.  HENT:  Head: Normocephalic and atraumatic.  Right Ear: External ear normal.  Left Ear: External ear normal.  Nose: Nose normal. No mucosal edema or rhinorrhea.  Mouth/Throat: Oropharynx is clear and moist and mucous membranes are normal. No dental abscesses or uvula swelling.  Eyes: Conjunctivae and EOM are normal. Pupils are equal, round, and reactive to light.  Neck: Normal range of motion and full passive range of motion without pain. Neck supple.  Cardiovascular: Normal rate, regular rhythm and normal heart sounds.  Exam reveals no gallop and no friction rub.   No murmur heard. Pulmonary/Chest: Effort normal and breath sounds normal. No respiratory distress. She has no wheezes. She has no rhonchi. She has no rales. She exhibits tenderness. She exhibits no crepitus.       Left  breast is nontender, no masses felt. She has some tenderness of her left lower anterior chest pain  Abdominal: Soft. Normal appearance and bowel sounds are normal. She exhibits no  distension. There is no tenderness. There is no rebound and no guarding.  Musculoskeletal: Normal range of motion. She exhibits no edema and no tenderness.       Moves all extremities well.   Neurological: She is alert and oriented to person, place, and time. She has normal strength. No cranial nerve deficit.  Skin: Skin is warm, dry and intact. No rash noted. No erythema. No pallor.  Psychiatric: She has a normal mood and affect. Her speech is normal and behavior is normal. Her mood appears not anxious.    ED Course  Procedures (including critical care time)   Medications  gi cocktail (Maalox,Lidocaine,Donnatal) (30 mL Oral Given 03/25/11 0830)  famotidine (PEPCID) tablet 20 mg (20 mg Oral Given 03/25/11 0830)  cyclobenzaprine (FLEXERIL) tablet 10 mg (10 mg Oral Given 03/25/11 0830)     Pt relates her pain is alittle better.   Results for orders placed during the hospital encounter of 03/25/11  CBC      Component Value Range   WBC 9.4  4.0 - 10.5 (K/uL)   RBC 4.45  3.87 - 5.11 (MIL/uL)   Hemoglobin 14.8  12.0 - 15.0 (g/dL)   HCT 16.1  09.6 - 04.5 (%)   MCV 91.7  78.0 - 100.0 (fL)   MCH 33.3  26.0 - 34.0 (pg)   MCHC 36.3 (*) 30.0 - 36.0 (g/dL)   RDW 40.9  81.1 - 91.4 (%)   Platelets 249  150 - 400 (K/uL)  DIFFERENTIAL      Component Value Range   Neutrophils Relative 39 (*) 43 - 77 (%)   Neutro Abs 3.6  1.7 - 7.7 (K/uL)   Lymphocytes Relative 55 (*) 12 - 46 (%)   Lymphs Abs 5.2 (*) 0.7 - 4.0 (K/uL)   Monocytes Relative 5  3 - 12 (%)   Monocytes Absolute 0.5  0.1 - 1.0 (K/uL)   Eosinophils Relative 1  0 - 5 (%)   Eosinophils Absolute 0.1  0.0 - 0.7 (K/uL)   Basophils Relative 0  0 - 1 (%)   Basophils Absolute 0.0  0.0 - 0.1 (K/uL)  BASIC METABOLIC PANEL      Component Value Range   Sodium 142   135 - 145 (mEq/L)   Potassium 3.4 (*) 3.5 - 5.1 (mEq/L)   Chloride 104  96 - 112 (mEq/L)   CO2 25  19 - 32 (mEq/L)   Glucose, Bld 165 (*) 70 - 99 (mg/dL)   BUN 10  6 - 23 (mg/dL)   Creatinine, Ser 7.82 (*) 0.50 - 1.10 (mg/dL)   Calcium 9.6  8.4 - 95.6 (mg/dL)   GFR calc non Af Amer >90  >90 (mL/min)   GFR calc Af Amer >90  >90 (mL/min)  POCT I-STAT TROPONIN I      Component Value Range   Troponin i, poc 0.01  0.00 - 0.08 (ng/mL)   Comment 3           HEPATIC FUNCTION PANEL      Component Value Range   Total Protein 7.0  6.0 - 8.3 (g/dL)   Albumin 3.8  3.5 - 5.2 (g/dL)   AST 19  0 - 37 (U/L)   ALT 24  0 - 35 (U/L)   Alkaline Phosphatase 83  39 - 117 (U/L)   Total Bilirubin 0.4  0.3 - 1.2 (mg/dL)   Bilirubin, Direct <2.1  0.0 - 0.3 (mg/dL)   Indirect Bilirubin NOT CALCULATED  0.3 - 0.9 (mg/dL)  LIPASE, BLOOD      Component Value Range   Lipase 14  11 - 59 (U/L)  POCT I-STAT TROPONIN I      Component Value Range   Troponin i, poc 0.00  0.00 - 0.08 (ng/mL)   Comment 3            Laboratory interpretation all normal except hyperglycemia   Dg Chest 2 View  03/25/2011  *RADIOLOGY REPORT*  Clinical Data: Chest pain and pressure.  CHEST - 2 VIEW  Comparison: Chest x-ray 05/17/2009.  Findings: Lung volumes are normal.  No consolidative airspace disease.  No pleural effusions.  No pneumothorax.  No pulmonary nodule or mass noted.  Pulmonary vasculature and the cardiomediastinal silhouette are within normal limits. Atherosclerotic calcifications within the arch of the aorta.  IMPRESSION: 1. No radiographic evidence of acute cardiopulmonary disease. 2.  Atherosclerosis.  Original Report Authenticated By: Florencia Reasons, M.D.    Date: 03/25/2011  Rate: 66  Rhythm: normal sinus rhythm  QRS Axis: normal  Intervals: QT prolonged  ST/T Wave abnormalities: nonspecific T wave changes  Conduction Disutrbances:none  Narrative Interpretation:   Old EKG Reviewed: none available     1.  Chest pain   2. GERD (gastroesophageal reflux disease)   3. Chest wall pain     New Prescriptions   METHOCARBAMOL (ROBAXIN) 500 MG TABLET    Take 2 po QID for chest wall soreness     Plan discharge to increase zegerid to twice a day for 2 weeks then once a day (states she has been on protonix a long time)   Devoria Albe, MD, Armando Gang     MDM          Ward Givens, MD 03/25/11 [phone]

## 2011-03-25 NOTE — ED Notes (Signed)
Patient resting with NAD at this time. Patient sitting up in chair beside bed.

## 2011-03-25 NOTE — ED Notes (Signed)
Patient states she woke up yesterday with chest pain and pain under her left breast radiation the the left side and this morning the pain was more intense. Patient denies N/V/F and denies SOB. Patient placed on monitor with sats of 100%. Family at bedside.

## 2011-03-25 NOTE — Discharge Instructions (Signed)
Avoid fried, spicey or greasy foods. Try to follow a low fat diet. Take the zegerid twice a day for the next 2 weeks, then once a day. You don't need to take the protonix while you are on the zegerid. You can take TUMS if you have discomfort between doses of the zegerid.Continue your carafate.  Take the robaxin for your chest wall soreness. Have Dr Swaziland recheck you if you aren't improving in the next week.  Return to the ED if you feel worse.

## 2011-05-10 ENCOUNTER — Other Ambulatory Visit: Payer: Self-pay | Admitting: Family Medicine

## 2011-05-14 ENCOUNTER — Other Ambulatory Visit: Payer: Self-pay | Admitting: Family Medicine

## 2011-05-14 MED ORDER — LORAZEPAM 0.5 MG PO TABS: 0.5000 mg | ORAL_TABLET | Freq: Two times a day (BID) | ORAL | Status: DC | PRN

## 2011-05-25 ENCOUNTER — Other Ambulatory Visit: Payer: Self-pay | Admitting: Family Medicine

## 2011-05-25 MED ORDER — GABAPENTIN 300 MG PO CAPS: ORAL_CAPSULE | ORAL | Status: DC

## 2011-06-01 ENCOUNTER — Encounter: Payer: Self-pay | Admitting: Family Medicine

## 2011-06-01 ENCOUNTER — Ambulatory Visit (INDEPENDENT_AMBULATORY_CARE_PROVIDER_SITE_OTHER): Payer: Medicare Other | Admitting: Family Medicine

## 2011-06-01 VITALS — BP 128/88 | HR 88 | Temp 98.0°F | Ht 65.5 in | Wt 201.6 lb

## 2011-06-01 DIAGNOSIS — E663 Overweight: Secondary | ICD-10-CM

## 2011-06-01 DIAGNOSIS — E119 Type 2 diabetes mellitus without complications: Secondary | ICD-10-CM

## 2011-06-01 DIAGNOSIS — I10 Essential (primary) hypertension: Secondary | ICD-10-CM

## 2011-06-01 DIAGNOSIS — E785 Hyperlipidemia, unspecified: Secondary | ICD-10-CM

## 2011-06-01 DIAGNOSIS — F17200 Nicotine dependence, unspecified, uncomplicated: Secondary | ICD-10-CM

## 2011-06-01 LAB — COMPLETE METABOLIC PANEL WITH GFR
ALT: 20 U/L (ref 0–35)
Alkaline Phosphatase: 80 U/L (ref 39–117)
BUN: 11 mg/dL (ref 6–23)
CO2: 24 mEq/L (ref 19–32)
Calcium: 9.4 mg/dL (ref 8.4–10.5)
Chloride: 105 mEq/L (ref 96–112)
Creat: 0.61 mg/dL (ref 0.50–1.10)
GFR, Est African American: >89 mL/min
GFR, Est Non African American: >89 mL/min
Glucose, Bld: 131 mg/dL — ABNORMAL HIGH (ref 70–99)
Total Bilirubin: 0.5 mg/dL (ref 0.3–1.2)

## 2011-06-01 LAB — LIPID PANEL
Cholesterol: 143 mg/dL (ref 0–200)
HDL: 40 mg/dL (ref >39)
LDL Cholesterol: 68 mg/dL (ref 0–99)
Total CHOL/HDL Ratio: 3.6 Ratio
Triglycerides: 177 mg/dL — ABNORMAL HIGH (ref <150)

## 2011-06-01 LAB — POCT GLYCOSYLATED HEMOGLOBIN (HGB A1C): Hemoglobin A1C: 8

## 2011-06-01 MED ORDER — TRAMADOL HCL 50 MG PO TABS
50.0000 mg | ORAL_TABLET | Freq: Three times a day (TID) | ORAL | Status: DC | PRN
Start: 2011-06-01 — End: 2011-11-16

## 2011-06-01 MED ORDER — TRIAMCINOLONE ACETONIDE 0.1 % EX CREA: TOPICAL_CREAM | Freq: Two times a day (BID) | CUTANEOUS | Status: DC

## 2011-06-01 MED ORDER — PANTOPRAZOLE SODIUM 40 MG PO TBEC: 40.0000 mg | DELAYED_RELEASE_TABLET | Freq: Every day | ORAL | Status: DC

## 2011-06-01 MED ORDER — SIMVASTATIN 20 MG PO TABS: 20.0000 mg | ORAL_TABLET | Freq: Every day | ORAL | Status: DC

## 2011-06-01 MED ORDER — ONETOUCH ULTRASOFT LANCETS MISC: Status: DC

## 2011-06-01 MED ORDER — LOSARTAN POTASSIUM 50 MG PO TABS: 50.0000 mg | ORAL_TABLET | Freq: Every day | ORAL | Status: DC

## 2011-06-01 MED ORDER — METFORMIN HCL 500 MG PO TABS: 1000.0000 mg | ORAL_TABLET | Freq: Every day | ORAL | Status: DC

## 2011-06-01 MED ORDER — HYDROCHLOROTHIAZIDE 12.5 MG PO CAPS: 12.5000 mg | ORAL_CAPSULE | ORAL | Status: DC

## 2011-06-01 NOTE — Progress Notes (Signed)
  Subjective:    Patient ID: [name], female    DOB: 04/22/1950, 61 y.o.   MRN: [id number]  HPI A 61 year old female here for followup with several questions #1 she is considering trying a cream for her neuropathy and wonders if this is safe. She's not quite sure what the cream is. #2 she would like to try a vitamin for her hair skin and nails and wonders if she could take it. She's not sure what the vitamin is but it is available [facility]. #3; her prescriptions written out again #4 she would like a prescription for triamcinolone cream, she's used this in the past she said she got from her dermatologist for some bumps that are caused by the son and that pop up in various places on her body. Still smoking- still plans quit day on her birthday.  Declines support.   Checks glucose at home 130s-140s.  Has lost some weight without trying.  Denies hypoglycemia.  + Dietary indiscretion.  Does not exercise.   Review of Systems See HPI    Objective:   Physical Exam  Nursing note and vitals reviewed. Constitutional: She appears well-developed and well-nourished. No distress.  Cardiovascular: Normal rate and regular rhythm.  Exam reveals no gallop and no friction rub.   No murmur heard. Pulmonary/Chest: Effort normal and breath sounds normal. No respiratory distress. She has no wheezes. She has no rales.  Musculoskeletal:       DP 2+ B.  Feet with mild peeling at soles.  Otherwise without lesions.  Skin: She is not diaphoretic.          Assessment & Plan:

## 2011-06-01 NOTE — Assessment & Plan Note (Signed)
Worsening control with increase in HgbA1C.  Pt declines another medicine.  She feels she can control with exercise and weight loss and nutrition.  Declines support services for this. Pt aware of worsening control.

## 2011-06-01 NOTE — Assessment & Plan Note (Signed)
Pt declines appt with Dr. Raymondo Band. Plans to quit on her own on her birthday.

## 2011-06-01 NOTE — Patient Instructions (Signed)
It was good to see you today. I am looking forward to your quit day! Please come see me in 1 month. Your hemoglobin A1C went up, so I think your plan to exercise and watch your nutrition is a good idea.  Please also check your sugar about 2 hours after eating a few times a week.

## 2011-06-01 NOTE — Assessment & Plan Note (Signed)
Pt intends to exercise and improve nutrition.  She will follow up in 1 month.

## 2011-06-01 NOTE — Assessment & Plan Note (Signed)
Check lipids today 

## 2011-06-01 NOTE — Assessment & Plan Note (Addendum)
Currently 129/99 - almost at goal.  Pt has had labile BPs in past, so will not alter meds at this time.  Continued weight loss, smoking cessation will help BP. Check CMP today.

## 2011-06-02 ENCOUNTER — Encounter: Payer: Self-pay | Admitting: Family Medicine

## 2011-07-02 ENCOUNTER — Encounter: Payer: Self-pay | Admitting: Family Medicine

## 2011-07-02 ENCOUNTER — Ambulatory Visit (INDEPENDENT_AMBULATORY_CARE_PROVIDER_SITE_OTHER): Payer: Medicare Other | Admitting: Family Medicine

## 2011-07-02 VITALS — BP 139/89 | HR 70 | Temp 98.3°F | Ht 65.5 in | Wt 197.2 lb

## 2011-07-02 DIAGNOSIS — E119 Type 2 diabetes mellitus without complications: Secondary | ICD-10-CM

## 2011-07-02 NOTE — Patient Instructions (Signed)
Your elevation in blood sugar could be your recent infection, or progression of your Diabetes.  If your blood sugar does not normalize to its usual range you may need to consider adding a second medication for diabetes.   It was nice to see you today.

## 2011-07-02 NOTE — Assessment & Plan Note (Signed)
Patient reluctant still to add a second medication or increase her metformin dose. She had a trend on her home glucose recordings up by 20 points. I think this is likely related to her recent URI treated with Azithro.  She has a follow up with Dr. Swaziland scheduled.

## 2011-07-02 NOTE — Progress Notes (Signed)
  Subjective:   Patient ID: [name], female DOB: [birth year] 61 y.o. MRN: [id number] HPI:  1. Elevation of BS Course: improving Synopsis: patient noted for the last 1.5 weeks that her blood sugars have been 20 points higher than usual. In the 160's to 180 range. She is recovering from a URI treated with Azithro. She was recently seen by Dr. Swaziland who noted worsening control of blood sugars. Patient is still reluctant to start a second med, or increase dose of metformin.  Onset: has been acute  Time period of: 1.5 week(s).  Severity is described as mild.  Aggravating: diet and lack of exercise, recent infection, worsening DM2 Alleviating: metformin 1000 mg QHS Associated sx/sn: no current infection signs, still smoker, no fever.   History  Substance Use Topics  . Smoking status: Current Everyday Smoker -- 1.0 packs/day    Types: Cigarettes  . Smokeless tobacco: Not on file  . Alcohol Use: No    Review of Systems: Pertinent items are noted in HPI.  Labs Reviewed: yes. Lab Results  Component Value Date   HGBA1C 8.0 06/01/2011  poct glucose: 150 Reviewed Chart Review for last notes.     Objective:   Filed Vitals:   07/02/11 0846  BP: 139/89  Pulse: 70  Temp: 98.3 F (36.8 C)  TempSrc: Oral  Height: 5' 5.5" (1.664 m)  Weight: 197 lb 3.2 oz (89.449 kg)   Physical Exam: General: aaf, obese, nad, hoarse voice (smoker), pleasant Lungs:  Normal respiratory effort, chest expands symmetrically. Lungs are clear to auscultation, no crackles or wheezes. Mouth - no lesions, mucous membranes are moist, no decaying teeth  Neck:  No deformities, thyromegaly, masses, or tenderness noted.   Supple with full range of motion without pain. Assessment & Plan:

## 2011-07-06 ENCOUNTER — Telehealth: Payer: Self-pay | Admitting: Family Medicine

## 2011-07-06 ENCOUNTER — Ambulatory Visit (INDEPENDENT_AMBULATORY_CARE_PROVIDER_SITE_OTHER): Payer: Medicare Other | Admitting: Family Medicine

## 2011-07-06 ENCOUNTER — Encounter: Payer: Self-pay | Admitting: Family Medicine

## 2011-07-06 VITALS — BP 138/88 | HR 73 | Temp 97.0°F | Ht 65.5 in | Wt 194.9 lb

## 2011-07-06 DIAGNOSIS — I10 Essential (primary) hypertension: Secondary | ICD-10-CM

## 2011-07-06 DIAGNOSIS — E663 Overweight: Secondary | ICD-10-CM

## 2011-07-06 DIAGNOSIS — E785 Hyperlipidemia, unspecified: Secondary | ICD-10-CM

## 2011-07-06 DIAGNOSIS — F17200 Nicotine dependence, unspecified, uncomplicated: Secondary | ICD-10-CM

## 2011-07-06 DIAGNOSIS — E119 Type 2 diabetes mellitus without complications: Secondary | ICD-10-CM

## 2011-07-06 MED ORDER — GLIPIZIDE 5 MG PO TABS: 5.0000 mg | ORAL_TABLET | Freq: Every day | ORAL | Status: DC

## 2011-07-06 MED ORDER — GABAPENTIN 300 MG PO CAPS: ORAL_CAPSULE | ORAL | Status: DC

## 2011-07-06 NOTE — Assessment & Plan Note (Signed)
Still plans to quit on birthday.  Efforts encouraged.

## 2011-07-06 NOTE — Assessment & Plan Note (Signed)
Has lost some weight.  Trying to exercise more.  Declines visit with nutrition.

## 2011-07-06 NOTE — Assessment & Plan Note (Signed)
Not at goal today, but plans to quit smoking and to continue to exercise.  Will defer changing meds since we are adding a new diabetic med to regimen today.

## 2011-07-06 NOTE — Assessment & Plan Note (Addendum)
Poor control as evidenced by recent fasting glucose and A1C. Recent elevations to 300s likely due to IM steroids, but even now with elevated fasting. Discussed options with pt.  She will continue TLC but is willing to add second medicine.  Will add glipizide 5 mg daily.  Pt to continue to monitor sugars at home.

## 2011-07-06 NOTE — Progress Notes (Signed)
  Subjective:    Patient ID: [name], female    DOB: 02/06/1950, 61 y.o.   MRN: [id number]  HPI 61 yo diabetic smoker here for follow up. 1) DM2 - recently seen by Dr. Rivka Safer for elevated blood sugars.  She was treated on 6/11 by her allergist with a steroid shot and a course of azitromycin, and noted her sugars to go up to the 300s.  For the last few days, her fastings have returned to her baseline of 140s-150s.  She takes her metformin.  She does not exercise and she does eat "sweets". She has been seen in diabetes education and by the [facility] nutritionist, and she does not want to go back to see them.  She really does not want to start insulin, and is hesitant about starting another medicine. 2) She notes leg cramps that occur when she first wakes up in the m[place]ing.  These are mostly in her calf, but once in her thigh.  They started a few weeks ago and have occurred a few times.  Wonders if it is from smoking.  Has tried no meds, but is considering salt or mustard.  They go away after a few minutes. 3) She needs a new rx for gabapentin. 4) She brought her "hair vitamin" for review.  She wonders if it is ok to take. 5) Still smoking.  Plans to quit on her birthday.  Plans to buy 4 more packs of cigarettes and then no more.  Does not feel she needs other help.    Review of Systems See HPI.  No chest pain or SOB.     Objective:   Physical Exam  Nursing note and vitals reviewed. Constitutional: She appears well-developed and well-nourished. No distress.  Cardiovascular: Normal rate, regular rhythm and normal heart sounds.  Exam reveals no gallop and no friction rub.   No murmur heard. Pulmonary/Chest: Effort normal and breath sounds normal. No respiratory distress. She has no wheezes. She has no rales.  Musculoskeletal:       BLE without c/c/e.  DP 2+. No lesions noted. B calf without erythema/edema/tenderness.  Skin: She is not diaphoretic.          Assessment & Plan:

## 2011-07-06 NOTE — Telephone Encounter (Signed)
Pt referred to [facility] case management Marcelino Duster) for diabetes, smoking and HTN.

## 2011-07-06 NOTE — Patient Instructions (Addendum)
It is always great to see you. I am glad you are planning on quitting smoking.  If you need help, then you can always contact the QUITLINE 1 800 QUIT NOW or come see Dr. Raymondo Band in our clinic.  He is great at helping people quit.   You can try some stretches for the leg cramps.   We will add another medicine in for the diabetes.  With some lifestyle changes, you might be able to stop this second medicine.  We will set up an appt with the diabetes educators and have the case manager contact you.

## 2011-07-15 ENCOUNTER — Telehealth: Payer: Self-pay | Admitting: Family Medicine

## 2011-07-15 NOTE — Telephone Encounter (Signed)
Has she been monitoring her sugars?  Is she having hypoglycemia?  If so, she should stop the medicine.  If she just feels odd but sugars are ok, then I would try the medicine for a few more days to see if she acclimates to it.  However, we could always start insulin if she feels she needs to switch medicines.

## 2011-07-15 NOTE — Telephone Encounter (Signed)
Patient wants to know if there is anything else that she can take for this?

## 2011-07-15 NOTE — Telephone Encounter (Signed)
Patient is calling because the Glipizide has got her way out there, focusing and dizzy, and she wants to speak to the nurse about this.

## 2011-08-13 ENCOUNTER — Other Ambulatory Visit: Payer: Self-pay | Admitting: Family Medicine

## 2011-08-18 ENCOUNTER — Other Ambulatory Visit: Payer: Self-pay | Admitting: Family Medicine

## 2011-08-24 ENCOUNTER — Other Ambulatory Visit: Payer: Self-pay | Admitting: Family Medicine

## 2011-08-24 ENCOUNTER — Other Ambulatory Visit: Payer: Self-pay | Admitting: *Deleted

## 2011-08-24 MED ORDER — GLUCOSE BLOOD VI STRP: ORAL_STRIP | Status: DC

## 2011-09-07 ENCOUNTER — Other Ambulatory Visit: Payer: Self-pay | Admitting: Family Medicine

## 2011-09-09 ENCOUNTER — Telehealth: Payer: Self-pay | Admitting: *Deleted

## 2011-09-09 NOTE — Telephone Encounter (Signed)
Patient called and left message on office voicemail.  Requesting refill of Lorazepam.  States medication was "out of date" on 09/07/11.  Returned call to patient and informed that Dr. Berline Chough will be in office tomorrow morning.  Patient states that she has an appt tomorrow morning with Dr. Earnest Bailey for CPE and will discuss refill at that time.  Patient has not been seen by Dr. Berline Chough and may need to change PCP to Dr. Earnest Bailey.  Will discuss office visit with Dr. Earnest Bailey.  Gaylene Brooks, RN

## 2011-09-10 ENCOUNTER — Ambulatory Visit (INDEPENDENT_AMBULATORY_CARE_PROVIDER_SITE_OTHER): Payer: Medicare Other | Admitting: Family Medicine

## 2011-09-10 ENCOUNTER — Ambulatory Visit: Payer: Medicare Other | Admitting: Family Medicine

## 2011-09-10 ENCOUNTER — Encounter: Payer: Self-pay | Admitting: Family Medicine

## 2011-09-10 VITALS — BP 101/56 | HR 84 | Temp 98.2°F | Ht 65.5 in | Wt 203.0 lb

## 2011-09-10 DIAGNOSIS — E119 Type 2 diabetes mellitus without complications: Secondary | ICD-10-CM

## 2011-09-10 DIAGNOSIS — R002 Palpitations: Secondary | ICD-10-CM

## 2011-09-10 DIAGNOSIS — I10 Essential (primary) hypertension: Secondary | ICD-10-CM

## 2011-09-10 DIAGNOSIS — K219 Gastro-esophageal reflux disease without esophagitis: Secondary | ICD-10-CM

## 2011-09-10 DIAGNOSIS — E785 Hyperlipidemia, unspecified: Secondary | ICD-10-CM

## 2011-09-10 DIAGNOSIS — Z0000 Encounter for general adult medical examination without abnormal findings: Secondary | ICD-10-CM

## 2011-09-10 DIAGNOSIS — F411 Generalized anxiety disorder: Secondary | ICD-10-CM

## 2011-09-10 MED ORDER — ATENOLOL 25 MG PO TABS: 25.0000 mg | ORAL_TABLET | Freq: Every day | ORAL | Status: DC

## 2011-09-10 MED ORDER — LORAZEPAM 0.5 MG PO TABS: 0.5000 mg | ORAL_TABLET | Freq: Two times a day (BID) | ORAL | Status: DC | PRN

## 2011-09-10 MED ORDER — SUCRALFATE 1 G PO TABS: 1.0000 g | ORAL_TABLET | Freq: Four times a day (QID) | ORAL | Status: DC

## 2011-09-10 MED ORDER — MECLIZINE HCL 12.5 MG PO TABS: 12.5000 mg | ORAL_TABLET | Freq: Three times a day (TID) | ORAL | Status: DC | PRN

## 2011-09-10 MED ORDER — LOSARTAN POTASSIUM 50 MG PO TABS: 50.0000 mg | ORAL_TABLET | Freq: Every day | ORAL | Status: DC

## 2011-09-10 MED ORDER — HYDROCHLOROTHIAZIDE 12.5 MG PO CAPS: 12.5000 mg | ORAL_CAPSULE | Freq: Every day | ORAL | Status: DC

## 2011-09-10 MED ORDER — METFORMIN HCL 1000 MG PO TABS: 1000.0000 mg | ORAL_TABLET | Freq: Every day | ORAL | Status: DC

## 2011-09-10 MED ORDER — PANTOPRAZOLE SODIUM 40 MG PO TBEC: 40.0000 mg | DELAYED_RELEASE_TABLET | Freq: Every day | ORAL | Status: DC

## 2011-09-10 MED ORDER — SIMVASTATIN 20 MG PO TABS: 20.0000 mg | ORAL_TABLET | Freq: Every day | ORAL | Status: DC

## 2011-09-10 NOTE — Assessment & Plan Note (Signed)
Refilled ativan 0.5 mg bid # 60. Given 3 prescriptions  Advised to consider "preventive" medicine to reduce reliance on benzos.  She will think about it.

## 2011-09-10 NOTE — Assessment & Plan Note (Signed)
Refill meds.  If BP continues to be low, will decrease meds

## 2011-09-10 NOTE — Assessment & Plan Note (Signed)
Will refill simvastatin

## 2011-09-10 NOTE — Assessment & Plan Note (Signed)
Screening up to date.  Normal pap 12 months ago.  No cervix- hysterectomy for fibroids.  Confirmed absense of cervix.  Recommend no further paps.

## 2011-09-10 NOTE — Assessment & Plan Note (Signed)
Refilled nexium 

## 2011-09-10 NOTE — Assessment & Plan Note (Signed)
Continue glipizide and metofmrin.  Will check a1c at next visit.  Have room to move up on metformin.

## 2011-09-10 NOTE — Progress Notes (Signed)
  Subjective:    Patient ID: [name], female    DOB: December 20, 1950, 61 y.o.   MRN: [id number]  HPI Here to meet new MD for physical exam  Concerned about medication refills. Screening reviewed and is UTD.  HYPERTENSION  BP Readings from Last 3 Encounters:  09/10/11 101/56  07/06/11 138/88  07/02/11 139/89    Hypertension ROS: taking medications as instructed, no medication side effects noted, home BP monitoring in range of 's systolic over 's diastolic, no chest pain on exertion, no dyspnea on exertion, no swelling of ankles and no intermittent claudication symptoms.   Does not feel any differently today, despite having lower blood pressure.  Not been this low at home.  DIABETES  Taking and tolerating: yes- started on glipizide on ljuly 1st.  declines a1c as she wants to see her a1c after 3 months on glipide Fasting blood sugars: 100-130.  NO hypoglycemia Hypoglycemic symptoms: no Visual problems: no Monitoring feet: yes Numbness/Tingling: no neuropathy Last eye exam: August 1 per aptient Diabetic Labs:  Lab Results  Component Value Date   HGBA1C 8.0 06/01/2011   HGBA1C 7.3 03/09/2011   HGBA1C 7.3 12/01/2010   Lab Results  Component Value Date   LDLCALC 68 06/01/2011   CREATININE 0.61 06/01/2011   Last microalbumin: No results found for this basename: MICROALBUR, [id number]          Review of Systems    Patient Information Form: Screening and ROS  AUDIT-C Score: 0 Do you feel safe in relationships? yes PHQ-2:negative  Review of Symptoms  General:  Negative for nexplained weight loss, fever Skin: Negative for new or changing mole, sore that won't heal HEENT: Negative for trouble hearing, trouble seeing, ringing in ears, mouth sores, hoarseness, change in voice, dysphagia. CV:  Negative for chest pain, dyspnea, edema, palpitations Resp: Negative for cough, dyspnea, hemoptysis GI: Negative for nausea, vomiting, diarrhea, constipation, abdominal pain,  melena, hematochezia. GU: Negative for dysuria, incontinence, urinary hesitance, hematuria, vaginal or penile discharge, polyuria, sexual difficulty, lumps in testicle or breasts MSK: Negative for muscle cramps or aches, joint pain or swelling Neuro: Negative for headaches, weakness, numbness, dizziness, passing out/fainting Psych: Negative for depression, anxiety, memory problems   Objective:   Physical Exam GEN: Alert & Oriented, No acute distress CV:  Regular Rate & Rhythm, no murmur Respiratory:  Normal work of breathing, CTAB Abd:  + BS, soft, no tenderness to palpation Ext: no pre-tibial edema Monofilament testing wnl on feet.  Skin normal. Pelvic Exam:        External: normal female genitalia without lesions or masses        Vagina: normal without lesions or masses        Cervix: not present        Adnexa: normal bimanual exam without masses or fullness        Uterus: normal by palpation         Breast:  Normal. No masses or skin changes.        Assessment & Plan:

## 2011-09-10 NOTE — Patient Instructions (Addendum)
Follow-up in 2-3 months  Nice to meet you!  Think about a medicine to help control daily anxiety with fewer side effects than lorazepam

## 2011-10-19 ENCOUNTER — Other Ambulatory Visit: Payer: Self-pay | Admitting: *Deleted

## 2011-10-19 MED ORDER — GABAPENTIN 300 MG PO CAPS: ORAL_CAPSULE | ORAL | Status: DC

## 2011-10-31 ENCOUNTER — Other Ambulatory Visit: Payer: Self-pay | Admitting: Family Medicine

## 2011-11-02 ENCOUNTER — Ambulatory Visit: Payer: Medicare Other | Admitting: Family Medicine

## 2011-11-02 ENCOUNTER — Telehealth: Payer: Self-pay | Admitting: *Deleted

## 2011-11-02 NOTE — Telephone Encounter (Signed)
[name] left message on physician/pharmacy line requesting refill on her Lorazepam.  Looking at last office visit on 09/10/2011,  patient was given three prescriptions for this medication.  Informed patient she should have one more RX for October.  Patient states her medicine bottle has no refills on it.  I called [facility] to see if they have the RX.  They do have prescription on file.  Informed patient she can call pharmacy around 11/09/11 to get medication refilled.  Patient in agreement.  Has appointment scheduled with Dr. Earnest Bailey 11/16/2011.  Ileana Ladd

## 2011-11-03 ENCOUNTER — Telehealth: Payer: Self-pay | Admitting: *Deleted

## 2011-11-03 MED ORDER — GLUCOSE BLOOD VI STRP: ORAL_STRIP | Status: DC

## 2011-11-03 MED ORDER — ONETOUCH ULTRASOFT LANCETS MISC: Status: DC

## 2011-11-03 NOTE — Telephone Encounter (Signed)
Patient calls stating Dr. Berline Chough sent in glucose strips for her in August with directions to  check BS once daily. Patient states when Dr. [place] was her PCP she told her to check BS 2-3 times daily. Now she runs out of stirps before month is out. She wants an RX sent to [facility]  stating to check 2-3 times daily . Also wants lancets. Advised patient I will send message to Dr. Earnest Bailey.

## 2011-11-03 NOTE — Telephone Encounter (Signed)
Refill sent.

## 2011-11-16 ENCOUNTER — Encounter: Payer: Self-pay | Admitting: Family Medicine

## 2011-11-16 ENCOUNTER — Ambulatory Visit (INDEPENDENT_AMBULATORY_CARE_PROVIDER_SITE_OTHER): Payer: Medicare Other | Admitting: Family Medicine

## 2011-11-16 ENCOUNTER — Ambulatory Visit: Payer: Medicare Other | Admitting: Family Medicine

## 2011-11-16 VITALS — BP 133/73 | HR 86 | Temp 98.3°F | Ht 65.5 in | Wt 202.0 lb

## 2011-11-16 DIAGNOSIS — F17200 Nicotine dependence, unspecified, uncomplicated: Secondary | ICD-10-CM

## 2011-11-16 DIAGNOSIS — F411 Generalized anxiety disorder: Secondary | ICD-10-CM

## 2011-11-16 DIAGNOSIS — I10 Essential (primary) hypertension: Secondary | ICD-10-CM

## 2011-11-16 DIAGNOSIS — E1149 Type 2 diabetes mellitus with other diabetic neurological complication: Secondary | ICD-10-CM

## 2011-11-16 DIAGNOSIS — Z79899 Other long term (current) drug therapy: Secondary | ICD-10-CM

## 2011-11-16 DIAGNOSIS — E1142 Type 2 diabetes mellitus with diabetic polyneuropathy: Secondary | ICD-10-CM

## 2011-11-16 DIAGNOSIS — R002 Palpitations: Secondary | ICD-10-CM

## 2011-11-16 DIAGNOSIS — E1140 Type 2 diabetes mellitus with diabetic neuropathy, unspecified: Secondary | ICD-10-CM

## 2011-11-16 MED ORDER — ZOSTER VACCINE LIVE 19400 UNT/0.65ML ~~LOC~~ SOLR: 0.6500 mL | Freq: Once | SUBCUTANEOUS | Status: DC

## 2011-11-16 MED ORDER — TRAMADOL HCL 50 MG PO TABS: 50.0000 mg | ORAL_TABLET | Freq: Three times a day (TID) | ORAL | Status: DC | PRN

## 2011-11-16 MED ORDER — LORAZEPAM 0.5 MG PO TABS: 0.5000 mg | ORAL_TABLET | Freq: Two times a day (BID) | ORAL | Status: DC | PRN

## 2011-11-16 MED ORDER — SUCRALFATE 1 G PO TABS: 1.0000 g | ORAL_TABLET | Freq: Four times a day (QID) | ORAL | Status: DC

## 2011-11-16 MED ORDER — GABAPENTIN 300 MG PO CAPS: ORAL_CAPSULE | ORAL | Status: DC

## 2011-11-16 MED ORDER — LOSARTAN POTASSIUM 50 MG PO TABS: 50.0000 mg | ORAL_TABLET | Freq: Every day | ORAL | Status: DC

## 2011-11-16 MED ORDER — METFORMIN HCL 1000 MG PO TABS: 1000.0000 mg | ORAL_TABLET | Freq: Every day | ORAL | Status: DC

## 2011-11-16 MED ORDER — ATENOLOL 25 MG PO TABS: 25.0000 mg | ORAL_TABLET | Freq: Every day | ORAL | Status: DC

## 2011-11-16 MED ORDER — GLIPIZIDE 5 MG PO TABS: 5.0000 mg | ORAL_TABLET | Freq: Every day | ORAL | Status: DC

## 2011-11-16 MED ORDER — GLUCOSE BLOOD VI STRP: ORAL_STRIP | Status: DC

## 2011-11-16 MED ORDER — HYDROCHLOROTHIAZIDE 12.5 MG PO CAPS: 12.5000 mg | ORAL_CAPSULE | Freq: Every day | ORAL | Status: DC

## 2011-11-16 MED ORDER — SIMVASTATIN 20 MG PO TABS: 20.0000 mg | ORAL_TABLET | Freq: Every day | ORAL | Status: DC

## 2011-11-16 MED ORDER — CVS LANCETS ULTRA THIN 30G MISC: Status: DC

## 2011-11-16 MED ORDER — PANTOPRAZOLE SODIUM 40 MG PO TBEC: 40.0000 mg | DELAYED_RELEASE_TABLET | Freq: Every day | ORAL | Status: DC

## 2011-11-16 NOTE — Progress Notes (Signed)
Subjective:     Patient ID: [name], female   DOB: 04/18/1950, 61 y.o.   MRN: [id number]  HPI 61 yo F presents for f/u visit to discuss the following:  1. DM: admits to tingling and numbness in extremities. Denies HA, vision changes, CP, SOB and GI upset.   2. HTN: denies HA, CP, SOB, LE edema.   3. Smoking: 3/4 PPD. Not ready to quit. Understands that smoking is exacerbating HTN and DM. Reports wheezing at night. Denies cough.    4. Anxiety: compliant with ativan take 1-2x daily. Took up to 3x daily last month because of death of sister. Has never tried long acting anti-anxiety medication.    5. Health maintenance: due for zostavax next year. Due for repeat colonoscopy in 5 years from last because of polyp. Had flu shot already. Will have repeat mammogram next month.   Review of Systems As per HPI     Objective:   Physical Exam BP 133/73  Pulse 86  Temp 98.3 F (36.8 C) (Oral)  Ht 5' 5.5" (1.664 m)  Wt 202 lb (91.627 kg)  BMI 33.10 kg/m2 General appearance: alert, cooperative and no distress Lungs: clear to auscultation bilaterally Heart: regular rate and rhythm, S1, S2 normal, no murmur, click, rub or gallop Extremities: extremities normal, atraumatic, no cyanosis or edema     Assessment and Plan:

## 2011-11-16 NOTE — Assessment & Plan Note (Signed)
A: BP at goal on current regimen. Meds: compliant with and tolerating.  P: refills given. Continue current regimen.

## 2011-11-16 NOTE — Assessment & Plan Note (Signed)
A: stable.  Meds: compliant with BZ.  P: Refilled BZ x 4 month supply. Discussed long acting anti-anxiety medication. Patient has never used and is considering. Prefers to stick with BZ for now. I suspect that she will need some pushing to get on long acting med which will be necessary as she ages and chronic BZ become contraindicated.

## 2011-11-16 NOTE — Assessment & Plan Note (Signed)
A: improved. A1c trending down. Persistent neuropathy.  Meds: compliant with and tolerating.  P: Continue current regimen  Encourage weight loss and smoking cessation.

## 2011-11-16 NOTE — Patient Instructions (Addendum)
[name],  Thank you for coming in today.  Regarding diabetes, A1c is trending down. To continue this healthy trend please do the following: 1. Continue all medications 2.  For your diet:  1. Make sure to eat breakfast, lunch and dinner (also may add one snack mid morning or mid afternoon).  2. Carbs: no more than 2 servings (30 gram/2oz) per meal and 1 serving per snack.  3. Exercise such that you sweat some and your heart rate goes up most days of the week.  4. Water, water, water  5. Check blood sugar 2-3 x per week to get a feel for how different foods effect your blood sugar:  Goal fasting <100  Goal after eating < 200  Regarding anxiety, keep in mind that there are many long acting anti-anxiety medications. I recommend them. Please consider this as an alternative to daily xanax.   Smoking cessation support: smoking cessation hotline: 1-800-QUIT-NOW.  Here is the number to the smoking cessation classes at [facility]: 960-4540  zostavax due at age >8.   F/u in 3 months.   Dr. Armen Pickup

## 2011-11-16 NOTE — Assessment & Plan Note (Signed)
A: contemplative about quitting.  P: smoking cessation counseling and resources provided.    

## 2011-11-18 ENCOUNTER — Emergency Department (INDEPENDENT_AMBULATORY_CARE_PROVIDER_SITE_OTHER)
Admission: EM | Admit: 2011-11-18 | Discharge: 2011-11-18 | Disposition: A | Discharge status: Home / Self Care | Payer: Medicare Other | Source: Home / Self Care

## 2011-11-18 ENCOUNTER — Encounter (HOSPITAL_COMMUNITY): Payer: Self-pay

## 2011-11-18 DIAGNOSIS — R059 Cough, unspecified: Secondary | ICD-10-CM

## 2011-11-18 DIAGNOSIS — J069 Acute upper respiratory infection, unspecified: Secondary | ICD-10-CM

## 2011-11-18 DIAGNOSIS — R05 Cough: Secondary | ICD-10-CM

## 2011-11-18 NOTE — ED Notes (Signed)
Patient states that she has a cough and soreness in chest from coughing, sx started Saturday, states cough productive, states that the left side nostril is clear, but right seems to drain

## 2011-11-18 NOTE — ED Provider Notes (Signed)
History     CSN: [id number]  Arrival date & time 11/18/11  4098   None     Chief Complaint  Patient presents with  . Cough    (Consider location/radiation/quality/duration/timing/severity/associated sxs/prior treatment) HPI Comments: Pleasant 61 year old female who presents with a runny nose for 5 days. 4 days ago she developed a cough and is tender worsened 3 days ago on Monday. At that time she saw her PCP for a runny nose but did not complain of a cough. She was given a medicine for cough and congestion at that time. She is complaining of cough and wheeze. But it turns out the wheeze is actually a sound coming from her throat. She denies fever chills abdominal pain or heaviness, tightness, or pressure in the chest. She is complaining of pain throughout the anterior and lateral torso upon coughing.   Past Medical History  Diagnosis Date  . Anxiety   . Cataract   . DM2 (diabetes mellitus, type 2)   . Hyperlipidemia   . Hypertension     essential, benign   . Skin lesion   . Insomnia   . Weakness   . Screening for malignant neoplasm of the cervix   . Tobacco abuse   . Palpitations     hx  . Routine general medical examination at a health care facility   . Dyslipidemia   . Disturbance of skin sensation     facial paresthesia; left  . Menopausal syndrome   . GERD (gastroesophageal reflux disease)   . AR (allergic rhinitis)     Past Surgical History  Procedure Date  . Abdominal hysterectomy 1990    For fibroids benign.  Still has ovaries  . Appendectomy   . Dental extractions   . Sigmoidoscopy 2004  . Colonoscopy 2009  . 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 Ladona Ridgel    Family History  Problem Relation Age of Onset  . Coronary artery disease      female, 1st degree relative <50  . Heart attack Father 4  . Hypertension Mother   . Stroke Mother   . Diabetes Mother     History  Substance Use Topics  . Smoking status: Current Every Day Smoker -- 0.8 packs/day    Types:  Cigarettes  . Smokeless tobacco: Never Used  . Alcohol Use: No    OB History    Grav Para Term Preterm Abortions TAB SAB Ect Mult Living                  Review of Systems  Constitutional: Negative for fever, chills, activity change, appetite change and fatigue.  HENT: Positive for congestion, rhinorrhea and postnasal drip. Negative for sore throat, facial swelling, neck pain and neck stiffness.   Eyes: Negative.   Respiratory: Negative.   Cardiovascular: Positive for chest pain.  Skin: Negative for pallor and rash.  Neurological: Negative.     Allergies  Amoxicillin-pot clavulanate and Penicillins  Home Medications   Current Outpatient Rx  Name  Route  Sig  Dispense  Refill  . ASPIRIN 81 MG PO CHEW   Oral   Chew 1 tablet (81 mg total) by mouth daily.   100 tablet   0   . ATENOLOL 25 MG PO TABS   Oral   Take 1 tablet (25 mg total) by mouth daily.   90 tablet   3   . TRUE2GO BLOOD GLUCOSE MONITOR W/DEVICE KIT      as directed.          Marland Kitchen  BLOOD PRESSURE CUFF MISC      Use as directed to monitor blood pressure   1 each   0   . [facility] LANCETS ULTRA THIN 30G MISC      2-3 times daily   100 each   11   . GABAPENTIN 300 MG PO CAPS      TAKE 2 CAPSULES IN THE MORNING TAKE 3 CAPSULES IN THE AFTERNOON AND 2 CAPSULES AT NIGHT   210 capsule   3   . GLIPIZIDE 5 MG PO TABS   Oral   Take 1 tablet (5 mg total) by mouth daily.   90 tablet   3   . GLUCOSE BLOOD VI STRP      Test blood glucose 2-3 times daily as directed   100 each   11     DX:  250.00   . HYDROCHLOROTHIAZIDE 12.5 MG PO CAPS   Oral   Take 1 capsule (12.5 mg total) by mouth daily.   90 capsule   3   . LORAZEPAM 0.5 MG PO TABS   Oral   Take 1 tablet (0.5 mg total) by mouth 2 (two) times daily as needed.   60 tablet   4   . LOSARTAN POTASSIUM 50 MG PO TABS   Oral   Take 1 tablet (50 mg total) by mouth daily.   90 tablet   3   . MECLIZINE HCL 12.5 MG PO TABS   Oral   Take 1  tablet (12.5 mg total) by mouth 3 (three) times daily as needed. if having symptoms.  May repeat in 12 hours later.   30 tablet   2   . METFORMIN HCL 1000 MG PO TABS   Oral   Take 1 tablet (1,000 mg total) by mouth at bedtime.   90 tablet   3   . MOMETASONE FUROATE 50 MCG/ACT NA SUSP   Nasal   Place 2 sprays into the nose daily.   17 g   3   . OLOPATADINE HCL 0.6 % NA SOLN   Other   1 puff by Other route every morning.          Marland Kitchen PANTOPRAZOLE SODIUM 40 MG PO TBEC   Oral   Take 1 tablet (40 mg total) by mouth daily.   90 tablet   3   . SIMVASTATIN 20 MG PO TABS   Oral   Take 1 tablet (20 mg total) by mouth at bedtime.   90 tablet   3   . SUCRALFATE 1 G PO TABS   Oral   Take 1 tablet (1 g total) by mouth 4 (four) times daily.   120 tablet   0   . TRAMADOL HCL 50 MG PO TABS   Oral   Take 1 tablet (50 mg total) by mouth every 8 (eight) hours as needed for pain.   270 tablet   3   . TRIAMCINOLONE ACETONIDE 0.1 % EX CREA   Topical   Apply topically 2 (two) times daily.   30 g   0     BP 121/75  Pulse 95  Temp 98.6 F (37 C) (Oral)  Resp 18  SpO2 93%  Physical Exam  Nursing note and vitals reviewed. Constitutional: She is oriented to person, place, and time. She appears well-developed and well-nourished. No distress.  HENT:  Right Ear: External ear normal.  Left Ear: External ear normal.  Mouth/Throat: Oropharynx is clear and moist.  Eyes: Conjunctivae  normal and EOM are normal.  Neck: Normal range of motion. Neck supple.  Cardiovascular: Normal rate and regular rhythm.   Pulmonary/Chest: Effort normal and breath sounds normal. No respiratory distress. She has no wheezes. She has no rales. She exhibits no tenderness.  Musculoskeletal: Normal range of motion. She exhibits no edema.  Lymphadenopathy:    She has no cervical adenopathy.  Neurological: She is alert and oriented to person, place, and time. No cranial nerve deficit.  Skin: Skin is warm  and dry. No rash noted.  Psychiatric: She has a normal mood and affect.    ED Course  Procedures (including critical care time)  Labs Reviewed - No data to display No results found.   1. URI (upper respiratory infection)   2. Cough       MDM  Continue taking the cough medicines prescribed by your physician earlier this week Also continue the over-the-counter antihistamine for drainage. It that is not sufficient he may have Chlor-Trimeton 2 mg at nighttime or every 4 hours as needed for drainage relief. Reassurance regarding the chest wall pain. This is intercostal muscle pain.        Hayden Rasmussen, NP 11/18/11 [phone]

## 2011-11-18 NOTE — ED Provider Notes (Signed)
Medical screening examination/treatment/procedure(s) were performed by non-physician practitioner and as supervising physician I was immediately available for consultation/collaboration.  Leanny Moeckel   Myda Detwiler, MD 11/18/11 1142 

## 2011-11-27 ENCOUNTER — Other Ambulatory Visit: Payer: Self-pay | Admitting: Family Medicine

## 2011-12-21 ENCOUNTER — Telehealth: Payer: Self-pay | Admitting: Family Medicine

## 2011-12-21 NOTE — Telephone Encounter (Signed)
LMOM advising pt of Dr Briscoe's recommendations. 

## 2011-12-21 NOTE — Telephone Encounter (Signed)
Please let patient know that most women do not get bone density testing until age 59.  If she feels she is higher risk than average, we should discuss this in an office visit.

## 2011-12-21 NOTE — Telephone Encounter (Signed)
Will forward to Dr. Briscoe. 

## 2011-12-21 NOTE — Telephone Encounter (Signed)
Pt is asking for a bone density test - does she need to be seen here first?

## 2011-12-25 ENCOUNTER — Ambulatory Visit (INDEPENDENT_AMBULATORY_CARE_PROVIDER_SITE_OTHER): Payer: Medicare Other | Admitting: Family Medicine

## 2011-12-25 ENCOUNTER — Encounter: Payer: Self-pay | Admitting: Family Medicine

## 2011-12-25 VITALS — BP 119/78 | HR 89 | Ht 65.0 in | Wt 203.0 lb

## 2011-12-25 DIAGNOSIS — S76309A Unspecified injury of muscle, fascia and tendon of the posterior muscle group at thigh level, unspecified thigh, initial encounter: Secondary | ICD-10-CM

## 2011-12-25 DIAGNOSIS — S79919A Unspecified injury of unspecified hip, initial encounter: Secondary | ICD-10-CM

## 2011-12-25 NOTE — Assessment & Plan Note (Signed)
A: hamstring pain from weakness and inactivity. No report or mechanism for injury or sprain. P: To strengthen and protect your hamstrings please so the following:  1. Recumbent bike 20 minutes 3-4x per day 2. Regular low impact exercise like water aerobics.  3. You have normal renal function so taking ibuprofen 600 mg 2-3 times daily for one week is also an option. 4. You can use heat just be mindful not to burn yourself.  F/u in 2 weeks if symptom persist. If pain is persistent PT might be helpful. Evaluate for myositis as patient is on statin.

## 2011-12-25 NOTE — Patient Instructions (Addendum)
[name]  To strengthen and protect your hamstrings please so the following:  1. Recumbent bike 20 minutes 3-4x per day 2. Regular low impact exercise like water aerobics.  3. You have normal renal function so taking ibuprofen 600 mg 2-3 times daily for one week is also an option. 4. You can use heat just be mindful not to burn yourself.  F/u in 2 weeks if symptom persist. Ask up front about requesting a PCP change. There is a form.  Dr. Armen Pickup     Hamstring Syndrome with Rehab Hamstring syndrome is a rare condition that causes pain and sometimes loss of feeling in the back of the thigh, often to the bottom of the foot. Hamstring syndrome is caused by pressure on the sciatic nerve in the hip, by a fibrous tissue that extends between two of the hamstring muscles on the backside of the thigh. The sciatic nerve may also be compressed between the muscles and bones of the pelvis. The hamstring is a collection of three muscles located on the backside of the thigh, that are responsible for straightening the hip and bending the knee. The hamstring muscles are important for walking, running, and jumping. The sciatic nerve usually passes near these muscles, and the pelvis runs under these muscles, in the thigh.  SYMPTOMS   Tingling, numbness, or burning in the back of the thigh to the back of the knee, and sometimes to the bottom of the foot.  Tenderness in the buttock.  Pain and discomfort (burning or dull ache) in the hip or groin, mid-buttock area, the back of the thigh, and sometimes to the knee.  Heaviness or fatigue in the leg.  Pain that worsens when sitting, running fast, kicking, or trying to stretch the hamstring muscles.  Pain that is less strong when laying flat on the back. CAUSES  Hamstring syndrome is caused by pressure on the sciatic nerve in the hip, by either a fibrous tissue or bone. RISK INCREASES WITH:  Sports that require jumping, sprinting, hurdling, or  sitting.  Kicking sports (like soccer and football kickers).  Recurring hamstring muscle strains.  Poor strength and flexibility. PREVENTION   Warm up and stretch properly before activity. RELATED COMPLICATIONS   Permanent numbness in the affected knee, leg, and foot.  Persistent pain in the knee, leg, and foot.  Increasing weakness of the leg.  Disability and inability to compete in sports. TREATMENT  Treatment first involves resting from activities that aggravate your symptoms. The use of anti-inflammatory medications will help reduce pain and inflammation. Strengthening and stretching exercises are important for reducing the severity of symptoms. These exercises may be completed at home or with a therapist. Corticosteroid injections may be given to help reduce inflammation and reduce pain. If non-surgical treatment is unsuccessful, then surgery may be needed, to free the compressed nerve.  MEDICATION  If pain medicine is needed, nonsteroidal anti-inflammatory medicines (aspirin and ibuprofen), or other minor pain relievers (acetaminophen), are often advised.  Do not take pain medicine for 7 days before surgery.  Prescription pain relievers may be given if your caregiver thinks they are needed. Use only as directed and only as much as you need.  Corticosteroid injections may be recommended. However, these injections should only be used for serious cases, as they can only be given a certain number of times. HEAT AND COLD  Cold treatment (icing) relieves pain and reduces inflammation. Cold treatment should be applied for 10 to 15 minutes every 2 to 3 hours,  and immediately after activity that aggravates your symptoms. Use ice packs or an ice massage.  Heat treatment may be used before performing the stretching and strengthening activities prescribed by your caregiver, physical therapist, or athletic trainer. Use a heat pack or a warm water soak. SEEK MEDICAL CARE IF:  Symptoms  get worse or do not improve in 2 weeks, despite treatment.  New, unexplained symptoms develop. (Drugs used in treatment may produce side effects.) EXERCISES RANGE OF MOTION (ROM)AND STRETCHING EXERCISES - Hamstring Syndrome These exercises may help you when beginning to rehabilitate your injury. Nerves can be easily irritated by excessive or incorrect movements. Only increase your repetitions with your caregiver's permission.Contact your caregiver if your symptoms get worse while doing any of the prescribed exercises. Your symptoms may go away with or without further involvement from your physician, physical therapist or athletic trainer. While completing these exercises, remember:   Restoring tissue flexibility helps normal motion to return to the joints. This allows healthier, less painful movement and activity.  An effective stretch should be held for at least 30 seconds.  A stretch should never be painful. You should only feel a gentle lengthening or release in the stretched tissue. STRETCH - Hamstrings, Standing  Stand or sit, and extend your right / left leg, placing your foot on a chair or foot stool.  Keep a slight arch in your low back and your hips straight forward.  Lead with your chest and lean forward at the waist, until you feel a gentle stretch in the back of your right / left knee or thigh. (When done correctly, this exercise requires leaning only a small distance.)  Hold this position for __________ seconds. Repeat __________ times. Complete this stretch __________ times per day. STRETCH  Hamstrings, Supine   Lie on your back. Loop a belt or towel over the ball of your right / left foot.  Straighten your right / left knee, and slowly pull on the belt to raise your leg. Do not allow the right / left knee to bend. Keep your opposite leg flat on the floor.  Raise the leg until you feel a gentle stretch behind your right / left knee or thigh. Hold this position for __________  seconds. Repeat __________ times. Complete this stretch __________ times per day.  STRETCH - Hamstrings, Doorway  Lie on your back with your right / left leg extended and resting on the wall, and the opposite leg flat on the ground through the door. Initially, position your bottom farther away from the wall.  Keep your right / left knee straight. If you feel a stretch behind your knee or thigh, hold this position for __________ seconds.  If you do not feel a stretch, scoot your bottom closer to the door and hold __________ seconds. Repeat __________ times. Complete this stretch __________ times per day.  STRETCH - Hamstrings/Adductors, V-Sit   Sit on the floor with your legs extended in a large "V," keeping your knees straight.  With your head and chest upright, bend at your waist reaching for your left foot to stretch your right thigh muscles.  You should feel a stretch in your right inner thigh. Hold for __________ seconds.  Return to the upright position to relax your leg muscles.  Continuing to keep your chest upright, bend straight forward at your waist to stretch your hamstrings.  You should feel a stretch behind both of your thighs and knees. Hold for __________ seconds.  Return to the upright position  to relax your leg muscles.  With your head and chest upright, bend at your waist reaching for your right foot to stretch your left thigh muscles.  You should feel a stretch in your left inner thigh. Hold for __________ seconds.  Return to the upright position to relax your leg muscles. Repeat __________ times. Complete this exercise __________ times per day.  MOBILIZATION EXERCISES - Hamstring Syndrome Mobilization exercises help trapped nerves to glide freely. When nerves have extra pressure on them, or when they get anchored down by surrounding tissues, they can cause pain, numbness or tingling. When completing a mobilization exercise, remember:  Nerves are very sensitive  tissue. They must be mobilized very gently. Never force a motion and do not push through discomfort.  Mobilize nerves slowly.  Nerves can be very long. Be sure to position all of your body parts exactly as described. MOBILIZATION - Nerve Root   Sit on a firm surface that is high enough for your right / left foot to swing freely. You may place a folded towel under your right / left thigh, if helpful.  Sit with a rounded or slouched back. Drop your head forward.  Keeping your right / left foot relaxed, slowly straighten your right / left knee, until it is fully extended or you feel a slight pull behind your knee or calf.  If you do not feel a slight pull, slowly draw your foot and toes toward you.  Hold for __________ seconds. Release the tension in your knee and ankle slowly. Repeat __________ times. Complete this exercise __________ times per day. Document Released: 12/29/2004 Document Revised: 03/23/2011 Document Reviewed: 04/12/2008 [employer] Patient Information 2013 [place], [place].

## 2011-12-25 NOTE — Progress Notes (Signed)
Subjective:     Patient ID: [name], female   DOB: [birth year], 61 y.o.   MRN: [id number]  HPI 61 yo F presents for same day visit for bilateral posterior hip pain on and off for one year. She has experienced pain again for the past two weeks. She denies injury or overuse. She does not exercise out of fear of exacerbating her chronic neck pain. She describes the pain as constant, radiating down thighs, achy and sharp. She has tried no treatment.   Review of Systems As per HPI    Objective:   Physical Exam BP 119/78  Pulse 89  Ht 5\' 5"  (1.651 m)  Wt 203 lb (92.08 kg)  BMI 33.78 kg/m2 General appearance: alert, cooperative and no distress Hip: ROM IR: 80 Deg, ER: 80 Deg, Flexion: 120 Deg, Extension: 100 Deg, Abduction: 45 Deg, Adduction: 45 Deg Strength IR: 5/5, ER: 5/5, Flexion: 5/5, Extension: 4+/5, Abduction: 5/5, Adduction: 5/5 Pelvic alignment unremarkable to inspection and palpation. Standing hip rotation and gait without trendelenburg / unsteadiness. Greater trochanter without tenderness to palpation. No tenderness over piriformis and greater trochanter. No SI joint tenderness and normal minimal SI movement. Hamstring tenderness. No palpable masses.      Assessment and Plan:

## 2011-12-29 ENCOUNTER — Telehealth: Payer: Self-pay | Admitting: *Deleted

## 2011-12-29 ENCOUNTER — Other Ambulatory Visit: Payer: Self-pay | Admitting: Family Medicine

## 2011-12-29 NOTE — Telephone Encounter (Signed)
Patient is allowed one PCP change.  Will discuss with Dr. Armen Pickup for approval and call patient back.   Gaylene Brooks, RN

## 2011-12-29 NOTE — Telephone Encounter (Signed)
Patient called back.  Requesting to change due to Dr. Earnest Bailey "cuts me off" and "is not professional."  Patient explained process.  Will route note to Dr. Earnest Bailey and call patient back.  Gaylene Brooks, RN

## 2011-12-29 NOTE — Telephone Encounter (Signed)
Ok to [employer] per clinic policy

## 2011-12-29 NOTE — Telephone Encounter (Signed)
12/25/11---Patient completed form requesting to change PCP.  Form states "Dr. Earnest Bailey is not a very friendly doctor.  She is very smart at the mouth.  And she didn't examine my feet until I mention to her.  I would like to change to Dr. Dessa Phi please.  Thank you."    Called patient for more info.  Left message to call our office back.  Gaylene Brooks, RN

## 2011-12-30 NOTE — Telephone Encounter (Signed)
I will take her on.

## 2011-12-31 NOTE — Telephone Encounter (Signed)
Patient informed of PCP change to Dr. Armen Pickup.  Gaylene Brooks, RN

## 2012-01-13 HISTORY — PX: HEMORRHOID BANDING: SHX5850

## 2012-01-19 ENCOUNTER — Other Ambulatory Visit: Payer: Self-pay | Admitting: Family Medicine

## 2012-01-20 ENCOUNTER — Other Ambulatory Visit: Payer: Self-pay | Admitting: Family Medicine

## 2012-02-04 ENCOUNTER — Encounter (HOSPITAL_COMMUNITY): Payer: Self-pay | Admitting: *Deleted

## 2012-02-04 ENCOUNTER — Emergency Department (INDEPENDENT_AMBULATORY_CARE_PROVIDER_SITE_OTHER)
Admission: EM | Admit: 2012-02-04 | Discharge: 2012-02-04 | Disposition: A | Discharge status: Home / Self Care | Payer: Medicare Other | Source: Home / Self Care | Attending: Emergency Medicine | Admitting: Emergency Medicine

## 2012-02-04 DIAGNOSIS — J329 Chronic sinusitis, unspecified: Secondary | ICD-10-CM

## 2012-02-04 MED ORDER — AZITHROMYCIN 250 MG PO TABS: ORAL_TABLET | ORAL | Status: DC

## 2012-02-04 NOTE — ED Notes (Signed)
Pt reports sinus congestion, facial pain and productive cough with yellow phelgm

## 2012-02-04 NOTE — ED Provider Notes (Signed)
History     CSN: [id number]  Arrival date & time 02/04/12  1322   None     Chief Complaint  Patient presents with  . URI    (Consider location/radiation/quality/duration/timing/severity/associated sxs/prior treatment) Patient is a 62 y.o. female presenting with cough. The history is provided by the patient. No language interpreter was used.  Cough This is a new problem. The current episode started more than 2 days ago. The problem occurs constantly. The problem has been gradually worsening. The cough is non-productive. There has been no fever. Associated symptoms include rhinorrhea. Pertinent negatives include no chest pain and no ear pain. The treatment provided no relief. She is a smoker. Her past medical history does not include pneumonia.  Pt complains of cough and congetsion.   Pt reports she has brown sinus drainage  Past Medical History  Diagnosis Date  . Anxiety   . Cataract   . DM2 (diabetes mellitus, type 2)   . Hyperlipidemia   . Hypertension     essential, benign   . Skin lesion   . Insomnia   . Weakness   . Screening for malignant neoplasm of the cervix   . Tobacco abuse   . Palpitations     hx  . Routine general medical examination at a health care facility   . Dyslipidemia   . Disturbance of skin sensation     facial paresthesia; left  . Menopausal syndrome   . GERD (gastroesophageal reflux disease)   . AR (allergic rhinitis)     Past Surgical History  Procedure Date  . Abdominal hysterectomy 1990    For fibroids benign.  Still has ovaries  . Appendectomy   . Dental extractions   . Sigmoidoscopy 2004  . Colonoscopy 2009  . 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 Ladona Ridgel    Family History  Problem Relation Age of Onset  . Coronary artery disease      female, 1st degree relative <50  . Heart attack Father 21  . Hypertension Mother   . Stroke Mother   . Diabetes Mother     History  Substance Use Topics  . Smoking status: Current Every Day Smoker --  0.8 packs/day    Types: Cigarettes  . Smokeless tobacco: Never Used  . Alcohol Use: No    OB History    Grav Para Term Preterm Abortions TAB SAB Ect Mult Living                  Review of Systems  HENT: Positive for congestion and rhinorrhea. Negative for ear pain and facial swelling.   Respiratory: Positive for cough.   Cardiovascular: Negative for chest pain.  All other systems reviewed and are negative.    Allergies  Amoxicillin-pot clavulanate and Penicillins  Home Medications   Current Outpatient Rx  Name  Route  Sig  Dispense  Refill  . ASPIRIN 81 MG PO CHEW   Oral   Chew 1 tablet (81 mg total) by mouth daily.   100 tablet   0   . ATENOLOL 25 MG PO TABS   Oral   Take 1 tablet (25 mg total) by mouth daily.   90 tablet   3   . TRUE2GO BLOOD GLUCOSE MONITOR W/DEVICE KIT      as directed.          Marland Kitchen BLOOD PRESSURE CUFF MISC      Use as directed to monitor blood pressure   1 each  0   . GABAPENTIN 300 MG PO CAPS      TAKE 2 CAPSULES IN THE MORNING TAKE 3 CAPSULES IN THE AFTERNOON AND 2 CAPSULES AT NIGHT   210 capsule   2   . GLIPIZIDE 5 MG PO TABS   Oral   Take 1 tablet (5 mg total) by mouth daily.   90 tablet   3   . GLUCOSE BLOOD VI STRP      Test blood glucose 2-3 times daily as directed   100 each   11     DX:  250.00   . HYDROCHLOROTHIAZIDE 12.5 MG PO CAPS   Oral   Take 1 capsule (12.5 mg total) by mouth daily.   90 capsule   3   . ONETOUCH ULTRASOFT LANCETS MISC      USE AS DIRECTED   100 each   11   . LORAZEPAM 0.5 MG PO TABS   Oral   Take 1 tablet (0.5 mg total) by mouth 2 (two) times daily as needed.   60 tablet   4   . LOSARTAN POTASSIUM 50 MG PO TABS   Oral   Take 1 tablet (50 mg total) by mouth daily.   90 tablet   3   . MECLIZINE HCL 12.5 MG PO TABS   Oral   Take 1 tablet (12.5 mg total) by mouth 3 (three) times daily as needed. if having symptoms.  May repeat in 12 hours later.   30 tablet   2   .  METFORMIN HCL ER 500 MG PO TB24      TAKE TWO TABLETS BY MOUTH DAILY   60 tablet   5     CYCLE FILL MEDICATION. Authorization is required f ...   . MOMETASONE FUROATE 50 MCG/ACT NA SUSP   Nasal   Place 2 sprays into the nose daily.   17 g   3   . OLOPATADINE HCL 0.6 % NA SOLN   Other   1 puff by Other route every morning.          Marland Kitchen PANTOPRAZOLE SODIUM 40 MG PO TBEC   Oral   Take 1 tablet (40 mg total) by mouth daily.   90 tablet   3   . SIMVASTATIN 20 MG PO TABS   Oral   Take 1 tablet (20 mg total) by mouth at bedtime.   90 tablet   3   . SUCRALFATE 1 G PO TABS   Oral   Take 1 tablet (1 g total) by mouth 4 (four) times daily.   120 tablet   0   . TRAMADOL HCL 50 MG PO TABS   Oral   Take 1 tablet (50 mg total) by mouth every 8 (eight) hours as needed for pain.   270 tablet   3   . TRIAMCINOLONE ACETONIDE 0.1 % EX CREA   Topical   Apply topically 2 (two) times daily.   30 g   0     BP 135/84  Pulse 88  Temp 98.5 F (36.9 C) (Oral)  Resp 20  SpO2 99%  Physical Exam  Nursing note and vitals reviewed. Constitutional: She appears well-developed and well-nourished.  HENT:  Head: Normocephalic and atraumatic.  Right Ear: External ear normal.  Left Ear: External ear normal.  Nose: Nose normal.  Mouth/Throat: Oropharynx is clear and moist.  Eyes: Conjunctivae normal and EOM are normal. Pupils are equal, round, and reactive to light.  Neck: Normal  range of motion. Neck supple.  Cardiovascular: Normal rate.   Pulmonary/Chest: Effort normal.  Musculoskeletal: Normal range of motion.  Neurological: She is alert.  Skin: Skin is warm.    ED Course  Procedures (including critical care time)  Labs Reviewed - No data to display No results found.   No diagnosis found.    MDM   zithromax       Lonia Skinner [place], [place] 02/04/12 1512

## 2012-02-04 NOTE — ED Provider Notes (Signed)
Medical screening examination/treatment/procedure(s) were performed by non-physician practitioner and as supervising physician I was immediately available for consultation/collaboration.  Marlen Mollica, M.D.   Farin Buhman C Arzell Mcgeehan, MD 02/04/12 1616 

## 2012-02-18 ENCOUNTER — Encounter: Payer: Self-pay | Admitting: Internal Medicine

## 2012-02-18 ENCOUNTER — Ambulatory Visit (INDEPENDENT_AMBULATORY_CARE_PROVIDER_SITE_OTHER): Payer: Medicare Other | Admitting: Internal Medicine

## 2012-02-18 VITALS — BP 112/68 | HR 87 | Ht 65.0 in | Wt 202.0 lb

## 2012-02-18 DIAGNOSIS — R002 Palpitations: Secondary | ICD-10-CM

## 2012-02-18 DIAGNOSIS — R05 Cough: Secondary | ICD-10-CM

## 2012-02-18 DIAGNOSIS — I10 Essential (primary) hypertension: Secondary | ICD-10-CM

## 2012-02-18 DIAGNOSIS — R059 Cough, unspecified: Secondary | ICD-10-CM

## 2012-02-18 MED ORDER — ATENOLOL 25 MG PO TABS: 25.0000 mg | ORAL_TABLET | Freq: Every day | ORAL | Status: DC

## 2012-02-18 NOTE — Patient Instructions (Signed)
Your physician wants you to follow-up in: 12 months with Dr Court Joy will receive a reminder letter in the mail two months in advance. If you don't receive a letter, please call our office to schedule the follow-up appointment.  A chest x-ray takes a picture of the organs and structures inside the chest, including the heart, lungs, and blood vessels. This test can show several things, including, whether the heart is enlarges; whether fluid is building up in the lungs; and whether pacemaker / defibrillator leads are still in place.

## 2012-02-18 NOTE — Progress Notes (Signed)
HPI [name] returns today for followup. She is a very pleasant middle-age woman with a history of SVT status post catheter ablation. She has had recurrent palpitations but no documented recurrent SVT. She has ongoing tobacco abuse and hypertension. She denies chest pain, shortness of breath, or syncope. She is been under increased stress over the death of family members. Allergies  Allergen Reactions  . Amoxicillin-Pot Clavulanate     REACTION: Unsure - maybe swollen?  Did not agree with her.  . Penicillins     REACTION: Unsure what reaction she has.     Current Outpatient Prescriptions  Medication Sig Dispense Refill  . aspirin 81 MG chewable tablet Chew 1 tablet (81 mg total) by mouth daily.  100 tablet  0  . atenolol (TENORMIN) 25 MG tablet Take 1 tablet (25 mg total) by mouth daily.  90 tablet  3  . atenolol (TENORMIN) 25 MG tablet Take 1 tablet (25 mg total) by mouth daily.  90 tablet  3  . azithromycin (ZITHROMAX) 250 MG tablet Take first 2 tablets together, then 1 every day until finished.  6 tablet  0  . Blood Glucose Monitoring Suppl (TRUE2GO BLOOD GLUCOSE MONITOR) W/DEVICE KIT as directed.       . Blood Pressure Monitoring (BLOOD PRESSURE CUFF) MISC Use as directed to monitor blood pressure  1 each  0  . gabapentin (NEURONTIN) 300 MG capsule TAKE 2 CAPSULES IN THE MORNING TAKE 3 CAPSULES IN THE AFTERNOON AND 2 CAPSULES AT NIGHT  210 capsule  2  . glipiZIDE (GLUCOTROL) 5 MG tablet Take 1 tablet (5 mg total) by mouth daily.  90 tablet  3  . glucose blood (TRUETRACK TEST) test strip Test blood glucose 2-3 times daily as directed  100 each  11  . hydrochlorothiazide (MICROZIDE) 12.5 MG capsule Take 1 capsule (12.5 mg total) by mouth daily.  90 capsule  3  . Lancets (ONETOUCH ULTRASOFT) lancets USE AS DIRECTED  100 each  11  . LORazepam (ATIVAN) 0.5 MG tablet Take 1 tablet (0.5 mg total) by mouth 2 (two) times daily as needed.  60 tablet  4  . losartan (COZAAR) 50 MG tablet Take 1  tablet (50 mg total) by mouth daily.  90 tablet  3  . meclizine (ANTIVERT) 12.5 MG tablet Take 1 tablet (12.5 mg total) by mouth 3 (three) times daily as needed. if having symptoms.  May repeat in 12 hours later.  30 tablet  2  . metFORMIN (GLUCOPHAGE-XR) 500 MG 24 hr tablet TAKE TWO TABLETS BY MOUTH DAILY  60 tablet  5  . mometasone (NASONEX) 50 MCG/ACT nasal spray Place 2 sprays into the nose daily.  17 g  3  . Olopatadine HCl (PATANASE) 0.6 % SOLN 1 puff by Other route every morning.       . pantoprazole (PROTONIX) 40 MG tablet Take 1 tablet (40 mg total) by mouth daily.  90 tablet  3  . simvastatin (ZOCOR) 20 MG tablet Take 1 tablet (20 mg total) by mouth at bedtime.  90 tablet  3  . sucralfate (CARAFATE) 1 G tablet Take 1 tablet (1 g total) by mouth 4 (four) times daily.  120 tablet  0  . traMADol (ULTRAM) 50 MG tablet Take 1 tablet (50 mg total) by mouth every 8 (eight) hours as needed for pain.  270 tablet  3  . triamcinolone cream (KENALOG) 0.1 % Apply topically 2 (two) times daily.  30 g  0  Past Medical History  Diagnosis Date  . Anxiety   . Cataract   . DM2 (diabetes mellitus, type 2)   . Hyperlipidemia   . Hypertension     essential, benign   . Skin lesion   . Insomnia   . Weakness   . Screening for malignant neoplasm of the cervix   . Tobacco abuse   . Palpitations     hx  . Routine general medical examination at a health care facility   . Dyslipidemia   . Disturbance of skin sensation     facial paresthesia; left  . Menopausal syndrome   . GERD (gastroesophageal reflux disease)   . AR (allergic rhinitis)     ROS:   All systems reviewed and negative except as noted in the HPI.   Past Surgical History  Procedure Date  . Abdominal hysterectomy 1990    For fibroids benign.  Still has ovaries  . Appendectomy   . Dental extractions   . Sigmoidoscopy 2004  . Colonoscopy 2009  . 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 Ladona Ridgel     Family History  Problem Relation  Age of Onset  . Coronary artery disease      female, 1st degree relative <50  . Heart attack Father 78  . Hypertension Mother   . Stroke Mother   . Diabetes Mother      History   Social History  . Marital Status: Married    Spouse Name: N/A    Number of Children: N/A  . Years of Education: N/A   Occupational History  . Not on file.   Social History Main Topics  . Smoking status: Current Every Day Smoker -- 0.8 packs/day    Types: Cigarettes  . Smokeless tobacco: Never Used  . Alcohol Use: No  . Drug Use: No  . Sexually Active: Not on file   Other Topics Concern  . Not on file   Social History Narrative   Married, does not get regular exercise. On disability. 2 miscarriages, one at 4 and one at 5 months; death at 1 day, unsure of cause.      BP 112/68  Pulse 87  Ht 5\' 5"  (1.651 m)  Wt 202 lb (91.627 kg)  BMI 33.61 kg/m2  SpO2 98%  Physical Exam:  Well appearing obese, middle-age woman, NAD HEENT: Unremarkable Neck:  7 cm JVD, no thyromegally Lungs:  Clear with no wheezes, rales, or rhonchi. HEART:  Regular rate rhythm, no murmurs, no rubs, no clicks Abd:  soft, positive bowel sounds, no organomegally, no rebound, no guarding Ext:  2 plus pulses, no edema, no cyanosis, no clubbing Skin:  No rashes no nodules Neuro:  CN II through XII intact, motor grossly intact  EKG normal sinus rhythm with nonspecific ST-T wave abnormality.  Assess/Plan:

## 2012-02-22 ENCOUNTER — Ambulatory Visit (INDEPENDENT_AMBULATORY_CARE_PROVIDER_SITE_OTHER)
Admission: RE | Admit: 2012-02-22 | Discharge: 2012-02-22 | Disposition: A | Discharge status: Home / Self Care | Payer: Medicare Other | Source: Ambulatory Visit | Attending: Internal Medicine | Admitting: Internal Medicine

## 2012-02-22 DIAGNOSIS — R05 Cough: Secondary | ICD-10-CM

## 2012-02-22 DIAGNOSIS — R059 Cough, unspecified: Secondary | ICD-10-CM

## 2012-03-09 ENCOUNTER — Encounter: Payer: Self-pay | Admitting: Family Medicine

## 2012-03-09 ENCOUNTER — Ambulatory Visit (INDEPENDENT_AMBULATORY_CARE_PROVIDER_SITE_OTHER): Payer: Medicare Other | Admitting: Family Medicine

## 2012-03-09 VITALS — BP 125/75 | HR 87 | Temp 98.7°F | Ht 65.0 in | Wt 205.0 lb

## 2012-03-09 DIAGNOSIS — E1140 Type 2 diabetes mellitus with diabetic neuropathy, unspecified: Secondary | ICD-10-CM

## 2012-03-09 DIAGNOSIS — E1142 Type 2 diabetes mellitus with diabetic polyneuropathy: Secondary | ICD-10-CM

## 2012-03-09 DIAGNOSIS — E1149 Type 2 diabetes mellitus with other diabetic neurological complication: Secondary | ICD-10-CM

## 2012-03-09 LAB — LDL CHOLESTEROL, DIRECT: Direct LDL: 75 mg/dL

## 2012-03-09 MED ORDER — SUCRALFATE 1 GM/10ML PO SUSP: 1.0000 g | Freq: Four times a day (QID) | ORAL | Status: DC | PRN

## 2012-03-09 NOTE — Patient Instructions (Addendum)
Mrs. Stills,  Thank you for coming in today.  Great job with your diabetes and BP.  Continue your current medication regimen. I see carafate liquid to your pharmacy.   Recommend exercise for 20-30 mins most days of the week with a goal of slow and steady weight loss.   F/u in 3-4 mos.   Dr. Armen Pickup

## 2012-03-10 ENCOUNTER — Other Ambulatory Visit: Payer: Self-pay | Admitting: *Deleted

## 2012-03-10 MED ORDER — TRAMADOL HCL 50 MG PO TABS
50.0000 mg | ORAL_TABLET | Freq: Three times a day (TID) | ORAL | Status: DC | PRN
Start: 2012-03-10 — End: 2012-06-10

## 2012-03-10 NOTE — Assessment & Plan Note (Signed)
A: A1c near goal. P: continue current regimen.

## 2012-03-10 NOTE — Progress Notes (Signed)
Subjective:     Patient ID: [name], female   DOB: 07/31/1950, 62 y.o.   MRN: [id number]  HPI 62 yo F presents for diabetes f/u:  She it staking her medication as prescribed. Fasting blood sugars 110-119. Denies low blood sugars. Has intermittent reflux. Request liquid carafate.   Review of Systems As per HPI    Objective:   Physical Exam BP 125/75  Pulse 87  Temp(Src) 98.7 F (37.1 C) (Oral)  Ht 5\' 5"  (1.651 m)  Wt 205 lb (92.987 kg)  BMI 34.11 kg/m2 General appearance: alert, cooperative and no distress Lungs: clear to auscultation bilaterally Heart: regular rate and rhythm, S1, S2 normal, no murmur, click, rub or gallop Extremities: extremities normal, atraumatic, no cyanosis or edema      Assessment and Plan:

## 2012-03-14 ENCOUNTER — Telehealth: Payer: Self-pay | Admitting: Family Medicine

## 2012-03-14 NOTE — Telephone Encounter (Signed)
Called patient. LDL normal. Continue simvastatin at current dose.

## 2012-04-14 ENCOUNTER — Telehealth: Payer: Self-pay | Admitting: Family Medicine

## 2012-04-14 NOTE — Telephone Encounter (Signed)
Patient will be going out of town and is asking for a written Rx for her Lorazepam so that she can get it filled where she will be.  Please call her when it is ready to be picked up.  She will be leaving to go out of town on 4/10.

## 2012-04-15 MED ORDER — LORAZEPAM 0.5 MG PO TABS: 0.5000 mg | ORAL_TABLET | Freq: Two times a day (BID) | ORAL | Status: DC | PRN

## 2012-04-15 NOTE — Telephone Encounter (Signed)
Called patient back.  she is going out of town for two weeks. Ativan rx printed for one month supply. No refills.

## 2012-05-29 ENCOUNTER — Other Ambulatory Visit: Payer: Self-pay | Admitting: Family Medicine

## 2012-06-03 ENCOUNTER — Other Ambulatory Visit: Payer: Self-pay | Admitting: Family Medicine

## 2012-06-03 ENCOUNTER — Ambulatory Visit: Payer: Medicare Other | Admitting: Family Medicine

## 2012-06-03 MED ORDER — LORAZEPAM 0.5 MG PO TABS: 0.5000 mg | ORAL_TABLET | Freq: Two times a day (BID) | ORAL | Status: DC | PRN

## 2012-06-03 NOTE — Telephone Encounter (Signed)
Called rx refill for ativan.

## 2012-06-10 ENCOUNTER — Ambulatory Visit (INDEPENDENT_AMBULATORY_CARE_PROVIDER_SITE_OTHER): Payer: Medicare Other | Admitting: Family Medicine

## 2012-06-10 ENCOUNTER — Encounter: Payer: Self-pay | Admitting: Family Medicine

## 2012-06-10 VITALS — BP 138/62 | HR 84 | Temp 99.8°F | Ht 65.0 in | Wt 200.0 lb

## 2012-06-10 DIAGNOSIS — Z0000 Encounter for general adult medical examination without abnormal findings: Secondary | ICD-10-CM

## 2012-06-10 DIAGNOSIS — E1149 Type 2 diabetes mellitus with other diabetic neurological complication: Secondary | ICD-10-CM

## 2012-06-10 DIAGNOSIS — I10 Essential (primary) hypertension: Secondary | ICD-10-CM

## 2012-06-10 DIAGNOSIS — B351 Tinea unguium: Secondary | ICD-10-CM | POA: Insufficient documentation

## 2012-06-10 DIAGNOSIS — E1142 Type 2 diabetes mellitus with diabetic polyneuropathy: Secondary | ICD-10-CM

## 2012-06-10 DIAGNOSIS — E1140 Type 2 diabetes mellitus with diabetic neuropathy, unspecified: Secondary | ICD-10-CM

## 2012-06-10 MED ORDER — GLIPIZIDE 5 MG PO TABS: 5.0000 mg | ORAL_TABLET | Freq: Every day | ORAL | Status: DC

## 2012-06-10 MED ORDER — TRAMADOL HCL 50 MG PO TABS: 50.0000 mg | ORAL_TABLET | Freq: Three times a day (TID) | ORAL | Status: DC | PRN

## 2012-06-10 MED ORDER — PANTOPRAZOLE SODIUM 40 MG PO TBEC: DELAYED_RELEASE_TABLET | ORAL | Status: DC

## 2012-06-10 MED ORDER — METFORMIN HCL ER 500 MG PO TB24: ORAL_TABLET | ORAL | Status: DC

## 2012-06-10 MED ORDER — MECLIZINE HCL 12.5 MG PO TABS: 12.5000 mg | ORAL_TABLET | Freq: Three times a day (TID) | ORAL | Status: DC | PRN

## 2012-06-10 MED ORDER — HYDROCHLOROTHIAZIDE 12.5 MG PO CAPS: 12.5000 mg | ORAL_CAPSULE | Freq: Every day | ORAL | Status: DC

## 2012-06-10 MED ORDER — LORAZEPAM 0.5 MG PO TABS: 0.5000 mg | ORAL_TABLET | Freq: Two times a day (BID) | ORAL | Status: DC | PRN

## 2012-06-10 MED ORDER — LOSARTAN POTASSIUM 50 MG PO TABS: 50.0000 mg | ORAL_TABLET | Freq: Every day | ORAL | Status: DC

## 2012-06-10 MED ORDER — GABAPENTIN 300 MG PO CAPS: ORAL_CAPSULE | ORAL | Status: DC

## 2012-06-10 MED ORDER — ZOSTER VACCINE LIVE 19400 UNT/0.65ML ~~LOC~~ SOLR: 0.6500 mL | Freq: Once | SUBCUTANEOUS | Status: DC

## 2012-06-10 MED ORDER — SIMVASTATIN 20 MG PO TABS: 20.0000 mg | ORAL_TABLET | Freq: Every day | ORAL | Status: DC

## 2012-06-10 NOTE — Assessment & Plan Note (Signed)
Patient declines treatment.

## 2012-06-10 NOTE — Assessment & Plan Note (Signed)
A: BP well controlled P: continue current regimen  

## 2012-06-10 NOTE — Assessment & Plan Note (Addendum)
Zostavax prescription provided. Repeat colonoscopy due.

## 2012-06-10 NOTE — Assessment & Plan Note (Signed)
A: slight decline P: continue current regimen. Re-iterated importance of compliance with diabetic diet.  F/u in 3 months

## 2012-06-10 NOTE — Patient Instructions (Addendum)
[name],  Thank you for coming in today.  Please come back fasting for cholesterol check.  F/u A1c in 3 months.   Due to zostavax next month. Due to repeat colonoscopy. Inquire at your appt on Monday.   Dr. Armen Pickup

## 2012-06-10 NOTE — Progress Notes (Signed)
Subjective:     Patient ID: [name], female   DOB: 1950-01-30, 62 y.o.   MRN: [id number]  HPI 62 year old female presents for diabetes followup:  1. Diabetes: Patient is compliant with oral hypoglycemics. She checks her fasting blood sugars the range is 126- 176. She denies low blood sugars. She admits to not being compliant with her diabetic diet and increased stress to 2 recent death her family as well as a fall downstairs that occurred 2 months ago. She injured her back in the fall for her back is now better.   2. hypertension: Patient compliant with medication. She denies chest pain, vision changes, chronic headache shortness of breath.  3. health maintenance: Additional Lasix the next month and is due for Zostavax at that time. She is also due for repeat colonoscopy at that time. Her initial colonoscopy was performed at [facility].  Review of Systems As per HPI     Objective:   Physical Exam BP 138/62  Pulse 84  Temp(Src) 99.8 F (37.7 C) (Oral)  Ht 5\' 5"  (1.651 m)  Wt 200 lb (90.719 kg)  BMI 33.28 kg/m2 General appearance: alert, cooperative and no distress Eyes: conjunctivae/corneas clear. PERRL, EOM's intact. Lungs: clear to auscultation bilaterally Heart: regular rate and rhythm, S1, S2 normal, no murmur, click, rub or gallop Extremities: extremities normal, atraumatic, no cyanosis or edema    Diabetic exam done today normal. Onychomycosis noted. Assessment and Plan:

## 2012-06-13 ENCOUNTER — Other Ambulatory Visit: Payer: Medicare Other

## 2012-06-14 ENCOUNTER — Other Ambulatory Visit: Payer: Medicare Other

## 2012-06-17 ENCOUNTER — Other Ambulatory Visit: Payer: Medicare Other

## 2012-06-17 DIAGNOSIS — E1140 Type 2 diabetes mellitus with diabetic neuropathy, unspecified: Secondary | ICD-10-CM

## 2012-06-17 LAB — COMPREHENSIVE METABOLIC PANEL
AST: 15 U/L (ref 0–37)
Albumin: 4.3 g/dL (ref 3.5–5.2)
Alkaline Phosphatase: 77 U/L (ref 39–117)
BUN: 10 mg/dL (ref 6–23)
Potassium: 3.7 mEq/L (ref 3.5–5.3)
Sodium: 139 mEq/L (ref 135–145)
Total Protein: 6.6 g/dL (ref 6.0–8.3)

## 2012-06-17 LAB — LIPID PANEL
LDL Cholesterol: 66 mg/dL (ref 0–99)
Total CHOL/HDL Ratio: 4 Ratio
VLDL: 30 mg/dL (ref 0–40)

## 2012-06-17 NOTE — Progress Notes (Signed)
CMP AND FLP DONE TODAY Azure Budnick 

## 2012-06-23 ENCOUNTER — Other Ambulatory Visit: Payer: Self-pay | Admitting: Family Medicine

## 2012-06-23 ENCOUNTER — Encounter: Payer: Self-pay | Admitting: Family Medicine

## 2012-06-23 ENCOUNTER — Telehealth: Payer: Self-pay | Admitting: *Deleted

## 2012-06-23 MED ORDER — ROSUVASTATIN CALCIUM 20 MG PO TABS: 20.0000 mg | ORAL_TABLET | Freq: Every day | ORAL | Status: DC

## 2012-06-23 NOTE — Telephone Encounter (Signed)
Returning pt call to for Dr. Armen Pickup - pt has left message asking for Dr. Armen Pickup to return call - left message with nurse office number for any assistance. Wyatt Haste, RN-BSN

## 2012-06-24 ENCOUNTER — Telehealth: Payer: Self-pay | Admitting: *Deleted

## 2012-06-24 NOTE — Telephone Encounter (Signed)
Pt request info on lowering cholesterol with diet and exercise. Massive amount of info left at desk for pt to pick up including AHA pledge, Mayo diet guidelines and grocery list and 85 page book from [place] [employer] with diet and recipes. Wyatt Haste, RN-BSN

## 2012-06-24 NOTE — Telephone Encounter (Signed)
Pt reports that Crestor is almost $600 for 3 months supply - pt cannot afford - can you prescribe something else? She is already on Simvastatin and will increase her exercise. Encouraged pt to go ahead with increased exercise and stated that I would call her back with further instructions. Wyatt Haste, RN-BSN

## 2012-07-01 MED ORDER — ATORVASTATIN CALCIUM 40 MG PO TABS: 40.0000 mg | ORAL_TABLET | Freq: Every day | ORAL | Status: DC

## 2012-07-01 NOTE — Addendum Note (Signed)
Addended by: Dessa Phi on: 07/01/2012 03:17 PM   Modules accepted: Orders, Medications

## 2012-07-01 NOTE — Telephone Encounter (Signed)
Call the patient back. I do understand that she is on simvastatin but I want her on a more potent statin. I sent him Lipitor 40 mg daily. Asked the patient to call her insurance will call her pharmacist and they concur no cough. If this is also too expensive she continue with the simvastatin.  I asked that she call back if questions regarding this.

## 2012-07-05 ENCOUNTER — Telehealth: Payer: Self-pay | Admitting: Family Medicine

## 2012-07-05 NOTE — Telephone Encounter (Signed)
Pt wants to know is generic Lipitor is safe to take since she is diabetic.  The paper received with the prescription says she should ask Please advise

## 2012-07-08 NOTE — Telephone Encounter (Signed)
Pt informed. Fleeger, Jessica Dawn  

## 2012-07-08 NOTE — Telephone Encounter (Signed)
Generic lipitor is fine to take. Please advise. Please get dose from patient so MAR can be updated.

## 2012-07-21 ENCOUNTER — Other Ambulatory Visit: Payer: Self-pay

## 2012-07-28 ENCOUNTER — Telehealth: Payer: Self-pay | Admitting: *Deleted

## 2012-07-28 NOTE — Telephone Encounter (Signed)
Pt is scheduled for colonoscopy on 08/08/2012.  The day before the colonoscopy, she is to have a liquid diet only.  Pt wondering if she should take her metformin that evening before the procedure.  Will fwd to MD for advice.  Kimmerly Lora, Darlyne Russian, CMA

## 2012-07-28 NOTE — Telephone Encounter (Signed)
Please advise patient to hold metformin the evening before her colonoscopy.

## 2012-07-28 NOTE — Telephone Encounter (Signed)
Pt advised of see below.  Bibiana Gillean, Darlyne Russian, CMA

## 2012-07-29 ENCOUNTER — Telehealth: Payer: Self-pay | Admitting: Internal Medicine

## 2012-07-29 ENCOUNTER — Telehealth: Payer: Self-pay | Admitting: Family Medicine

## 2012-07-29 NOTE — Telephone Encounter (Signed)
New Problem  Pt is scheduled to have a colonoscopy on July 29, she wants to know when she needs to stop taking her aspirin.

## 2012-07-29 NOTE — Telephone Encounter (Signed)
Follow up  Returning a call

## 2012-07-29 NOTE — Telephone Encounter (Signed)
Pt would for Dr. Ladona Ridgel to [facility] for her to be off aspirin prior having a colonoscopy. The procedure is schedules for July 28 th 2014.

## 2012-07-29 NOTE — Telephone Encounter (Signed)
Left pt a message to call back. 

## 2012-07-29 NOTE — Telephone Encounter (Signed)
The patient is coming in on 8/29 for her physical on 8/29 in the afternoon and she wants to come first thing that morning for fasting labs.  She just needs the orders to be put in for those labs.

## 2012-07-31 NOTE — Telephone Encounter (Signed)
Ok to come off of ASA

## 2012-08-01 ENCOUNTER — Telehealth: Payer: Self-pay | Admitting: Family Medicine

## 2012-08-01 NOTE — Telephone Encounter (Signed)
Pt reports coming into office in May and not having fasting labs done - only checked a1c and cholesterol - stated that she needs liver enzymes drawn in August because she was unable to have them drawn in June because she had already eaten breakfast. - at her physical she would like complete physical and pap smear.  Wyatt Haste, RN-BSN

## 2012-08-01 NOTE — Telephone Encounter (Signed)
Patient had labs (CMP and FLP) done last month.  Only due for A1c at f/u appt. Please inform patient.  No labs ordered. Was there something in particular she was concerned about or wanted?

## 2012-08-01 NOTE — Telephone Encounter (Signed)
Pt is requesting Lanora Manis call her and explain labs, and why Dr. Armen Pickup does not order some for her. JW

## 2012-08-01 NOTE — Telephone Encounter (Signed)
Called patient and informed her that her liver enzymes were checked in June annd were normal. A letter was sent at that time.  I told the patient that we would repeat labs in 6 months. No need to repeat sooner  Patient informed me that she got the letter. Did not know what was what in regards to labs. Happy to hear that liver function test were normal.

## 2012-08-01 NOTE — Telephone Encounter (Signed)
Left message for pt to call back.  Please inform of message from Dr. Armen Pickup when she does.  Thanks Harrold Fitchett,  Jetty Berland

## 2012-08-02 NOTE — Telephone Encounter (Signed)
Pt aware, per Dennis Bast RN.

## 2012-08-25 ENCOUNTER — Other Ambulatory Visit: Payer: Self-pay | Admitting: Family Medicine

## 2012-09-09 ENCOUNTER — Ambulatory Visit (INDEPENDENT_AMBULATORY_CARE_PROVIDER_SITE_OTHER): Payer: Medicare Other | Admitting: Family Medicine

## 2012-09-09 ENCOUNTER — Encounter: Payer: Self-pay | Admitting: Family Medicine

## 2012-09-09 ENCOUNTER — Ambulatory Visit (HOSPITAL_COMMUNITY)
Admission: RE | Admit: 2012-09-09 | Discharge: 2012-09-09 | Disposition: A | Discharge status: Home / Self Care | Payer: Medicare Other | Source: Ambulatory Visit | Attending: Family Medicine | Admitting: Family Medicine

## 2012-09-09 ENCOUNTER — Encounter: Payer: Medicare Other | Admitting: Family Medicine

## 2012-09-09 ENCOUNTER — Other Ambulatory Visit (INDEPENDENT_AMBULATORY_CARE_PROVIDER_SITE_OTHER): Payer: Medicare Other

## 2012-09-09 VITALS — BP 133/67 | HR 79 | Temp 97.2°F | Resp 18

## 2012-09-09 VITALS — BP 129/70 | HR 85 | Temp 99.8°F | Ht 65.0 in | Wt 198.0 lb

## 2012-09-09 DIAGNOSIS — R0789 Other chest pain: Secondary | ICD-10-CM | POA: Insufficient documentation

## 2012-09-09 DIAGNOSIS — R079 Chest pain, unspecified: Secondary | ICD-10-CM

## 2012-09-09 DIAGNOSIS — E1142 Type 2 diabetes mellitus with diabetic polyneuropathy: Secondary | ICD-10-CM

## 2012-09-09 DIAGNOSIS — E1140 Type 2 diabetes mellitus with diabetic neuropathy, unspecified: Secondary | ICD-10-CM

## 2012-09-09 DIAGNOSIS — Z23 Encounter for immunization: Secondary | ICD-10-CM

## 2012-09-09 DIAGNOSIS — E1149 Type 2 diabetes mellitus with other diabetic neurological complication: Secondary | ICD-10-CM

## 2012-09-09 DIAGNOSIS — F17200 Nicotine dependence, unspecified, uncomplicated: Secondary | ICD-10-CM

## 2012-09-09 DIAGNOSIS — R9431 Abnormal electrocardiogram [ECG] [EKG]: Secondary | ICD-10-CM | POA: Insufficient documentation

## 2012-09-09 DIAGNOSIS — B070 Plantar wart: Secondary | ICD-10-CM

## 2012-09-09 LAB — POCT GLYCOSYLATED HEMOGLOBIN (HGB A1C): Hemoglobin A1C: 7.9

## 2012-09-09 MED ORDER — METFORMIN HCL ER (OSM) 1000 MG PO TB24: 2000.0000 mg | ORAL_TABLET | Freq: Every day | ORAL | Status: DC

## 2012-09-09 NOTE — Progress Notes (Signed)
A1C done today.[name], [name]

## 2012-09-09 NOTE — Progress Notes (Addendum)
Subjective:     Patient ID: [name], female   DOB: [birth year], 62 y.o.   MRN: [id number]  HPI 63 yo F presents for f/u visit to discuss the following:  Of note, she was seen this AM as a same day after developing CP and flushing in the waiting room, please see visit for details. She has not has recurrent symptoms.   1. Diabetes: compliant with medications. checks CBGs. No low CBGs. Most recent fasting blood sugar was 143.  ROS: denies vision changes, HA, CP, SOB. Has some tingling in feet, not in hands.  Eye exam: completed with Dr. Emily Filbert 08/26/2012.  Foot exam: due.  Goes to optometry regularly to have toenails clipped.   2. Smoking: continues to smoke. Not ready to quit. Denies chronic cough.   3. Plantar wart: L foot. Chronic. No pain. Occasionally filed down.   4. Healthcare maintenance: has zostavax after her birthday. Reports having colonoscopy on July 28th performed by Dr. Miquel Dunn at [facility]. Is not due for pap smear.   Review of Systems As per HPI     Objective:   Physical Exam BP 129/70  Pulse 85  Temp(Src) 99.8 F (37.7 C) (Oral)  Ht 5\' 5"  (1.651 m)  Wt 198 lb (89.812 kg)  BMI 32.95 kg/m2 General appearance: alert, cooperative and no distress Neck: no adenopathy, no carotid bruit, no JVD, supple, symmetrical, trachea midline and thyroid not enlarged, symmetric, no tenderness/mass/nodules Lungs: clear to auscultation bilaterally Heart: regular rate and rhythm, S1, S2 normal, no murmur, click, rub or gallop Extremities: edema trace   Diabetic foot exam: done, normal.  Plantar wart on the medial side of L foot treated with liquid nitrogen x 3.        Assessment and Plan:

## 2012-09-09 NOTE — Assessment & Plan Note (Signed)
Quick onset and resolving pain in chest. Feels better after belching. No red flag symptoms on exam. Neurovascularly intact. EKG wnl. Vital signs stable. Offered ASA in clinic, but states she will take her medications at home. F/u with Dr. Armen Pickup as previously scheduled for all other concerns and routine follow up, which may be later today.

## 2012-09-09 NOTE — Progress Notes (Signed)
Patient ID: [name], female   DOB: 09/22/1950, 62 y.o.   MRN: [id number]  [facility] Tammy Rigdon M. Shakari Qazi, MD Phone: [phone]   Subjective: HPI: Patient is a 62 y.o. female presenting to clinic today for same day walk-in clinic.  She was sitting in waiting room prior to getting labs done this morning. She had a severe pain on right side of head, went down her throat, and into her chest. Currently pain free. No problems speaking at time of onset, no weakness. Never had anything like this before. No changes in vision. She belched and felt better. She has a PMH of palpitations (followed by [facility] Ladona Ridgel at [facility]) but did not have any palpitations at the time. She has not taken any medications this morning other that Alprazolam.  History Reviewed: Every day smoker.  ROS: Please see HPI above.  Objective: Office vital signs reviewed. BP 133/67  Pulse 79  Temp(Src) 97.2 F (36.2 C) (Oral)  Resp 18  Physical Examination:  General: Awake, alert. NAD.  HEENT: Atraumatic, normocephalic. MMM. Pupils equal round and reactive.  Neck: No masses palpated. No LAD Pulm: CTAB, no wheezes Cardio: RRR, no murmurs appreciated Abdomen:+BS, soft, nontender, nondistended Extremities: No edema Neuro: Grossly intact. CN grossly intact. Good strength of upper extremties  Assessment: 62 y.o. female same day for resolved chest pain  Plan: See Problem List and After Visit Summary

## 2012-09-09 NOTE — Patient Instructions (Addendum)
[name],  Thank you for coming in today. Your pap smear is due in August of 2015.   You are doing a great job with your diabetes and hypertension. Please increase your metformin to 2000 mg in the morning to achieve tighter control, goal A1c is 7. Goal fasting blood sugar is less than 130.   You are doing an even better job on staying up to date on screening test and immunizations.   Please come back next month for nurse visit to get your flu shot.   Plan to f/u with me in 4 months for diabetes follow up.  Dr. Armen Pickup

## 2012-09-09 NOTE — Patient Instructions (Addendum)
It was good to see you. Your heart and neurologic function are normal.  Please follow up with Dr. Armen Pickup for your other health concerns.  Take care, and make sure you take your medications today.  Tavia Stave M. Easten Maceachern, M.D.

## 2012-09-13 ENCOUNTER — Telehealth: Payer: Self-pay | Admitting: *Deleted

## 2012-09-13 DIAGNOSIS — R002 Palpitations: Secondary | ICD-10-CM

## 2012-09-13 DIAGNOSIS — I10 Essential (primary) hypertension: Secondary | ICD-10-CM

## 2012-09-13 DIAGNOSIS — B070 Plantar wart: Secondary | ICD-10-CM | POA: Insufficient documentation

## 2012-09-13 MED ORDER — GLIPIZIDE 5 MG PO TABS: 5.0000 mg | ORAL_TABLET | Freq: Every day | ORAL | Status: DC

## 2012-09-13 MED ORDER — METFORMIN HCL ER (OSM) 1000 MG PO TB24: 2000.0000 mg | ORAL_TABLET | Freq: Every day | ORAL | Status: DC

## 2012-09-13 MED ORDER — PANTOPRAZOLE SODIUM 40 MG PO TBEC: DELAYED_RELEASE_TABLET | ORAL | Status: DC

## 2012-09-13 MED ORDER — HYDROCHLOROTHIAZIDE 12.5 MG PO CAPS: ORAL_CAPSULE | ORAL | Status: DC

## 2012-09-13 MED ORDER — ATENOLOL 25 MG PO TABS: 25.0000 mg | ORAL_TABLET | Freq: Every day | ORAL | Status: DC

## 2012-09-13 MED ORDER — LORAZEPAM 0.5 MG PO TABS: 0.5000 mg | ORAL_TABLET | Freq: Two times a day (BID) | ORAL | Status: DC | PRN

## 2012-09-13 NOTE — Telephone Encounter (Signed)
Pt is requesting med refills to be printed and left up front - states that she forgot to get them during appointment last week.Wyatt Haste, RN-BSN

## 2012-09-13 NOTE — Assessment & Plan Note (Addendum)
A: slight improvement.  P: increase metformin to 2000 mg daily to get closer to goal  Continue daily ASA and statin

## 2012-09-13 NOTE — Assessment & Plan Note (Signed)
Treated with liquid nitrogen 

## 2012-09-13 NOTE — Telephone Encounter (Signed)
Daily meds (ativan, glipizide, metformin, atenolol, protonix, HCTZ)  printed and placed up front for pick up.  Please inform patient.

## 2012-09-13 NOTE — Assessment & Plan Note (Signed)
A: contemplative about quitting.  P: smoking cessation counseling and resources provided.

## 2012-09-26 ENCOUNTER — Other Ambulatory Visit: Payer: Self-pay | Admitting: Family Medicine

## 2012-09-26 MED ORDER — GABAPENTIN 300 MG PO CAPS: ORAL_CAPSULE | ORAL | Status: DC

## 2012-09-29 ENCOUNTER — Encounter: Payer: Self-pay | Admitting: Family Medicine

## 2012-09-29 DIAGNOSIS — J45909 Unspecified asthma, uncomplicated: Secondary | ICD-10-CM | POA: Insufficient documentation

## 2012-09-29 DIAGNOSIS — J309 Allergic rhinitis, unspecified: Secondary | ICD-10-CM

## 2012-09-29 DIAGNOSIS — H1045 Other chronic allergic conjunctivitis: Secondary | ICD-10-CM

## 2012-11-14 ENCOUNTER — Telehealth: Payer: Self-pay | Admitting: Family Medicine

## 2012-11-14 MED ORDER — LORAZEPAM 0.5 MG PO TABS: 0.5000 mg | ORAL_TABLET | Freq: Two times a day (BID) | ORAL | Status: DC | PRN

## 2012-11-14 NOTE — Telephone Encounter (Signed)
Refilled BZ x  60. No additional refills w/o f/u appt.

## 2012-11-27 ENCOUNTER — Other Ambulatory Visit: Payer: Self-pay | Admitting: Family Medicine

## 2012-11-30 ENCOUNTER — Other Ambulatory Visit: Payer: Self-pay | Admitting: Family Medicine

## 2012-12-06 LAB — HEMOGLOBIN A1C: Hgb A1c MFr Bld: 7.6 % — AB (ref 4.0–6.0)

## 2012-12-12 ENCOUNTER — Other Ambulatory Visit: Payer: Self-pay | Admitting: Family Medicine

## 2012-12-13 ENCOUNTER — Telehealth: Payer: Self-pay | Admitting: Family Medicine

## 2012-12-13 MED ORDER — TRAMADOL HCL 50 MG PO TABS: 50.0000 mg | ORAL_TABLET | Freq: Three times a day (TID) | ORAL | Status: DC | PRN

## 2012-12-13 NOTE — Telephone Encounter (Signed)
Called patient. She has a an appt with me in two weeks.  Called in one month supply of medications: ativan and tramadol.

## 2012-12-13 NOTE — Telephone Encounter (Signed)
Pt called and needs a refill on her Lorazepam sent to her pharmacy. jw

## 2012-12-13 NOTE — Telephone Encounter (Signed)
Refills called in 

## 2012-12-14 ENCOUNTER — Encounter: Payer: Self-pay | Admitting: Family Medicine

## 2012-12-27 ENCOUNTER — Encounter: Payer: Self-pay | Admitting: Family Medicine

## 2012-12-27 ENCOUNTER — Ambulatory Visit (INDEPENDENT_AMBULATORY_CARE_PROVIDER_SITE_OTHER): Payer: Medicare Other | Admitting: Family Medicine

## 2012-12-27 VITALS — BP 129/82 | HR 91 | Temp 98.7°F | Wt 201.6 lb

## 2012-12-27 DIAGNOSIS — Z72821 Inadequate sleep hygiene: Secondary | ICD-10-CM

## 2012-12-27 DIAGNOSIS — R002 Palpitations: Secondary | ICD-10-CM

## 2012-12-27 DIAGNOSIS — I10 Essential (primary) hypertension: Secondary | ICD-10-CM

## 2012-12-27 DIAGNOSIS — F411 Generalized anxiety disorder: Secondary | ICD-10-CM

## 2012-12-27 DIAGNOSIS — F518 Other sleep disorders not due to a substance or known physiological condition: Secondary | ICD-10-CM

## 2012-12-27 DIAGNOSIS — F17200 Nicotine dependence, unspecified, uncomplicated: Secondary | ICD-10-CM

## 2012-12-27 DIAGNOSIS — E1142 Type 2 diabetes mellitus with diabetic polyneuropathy: Secondary | ICD-10-CM

## 2012-12-27 DIAGNOSIS — E1140 Type 2 diabetes mellitus with diabetic neuropathy, unspecified: Secondary | ICD-10-CM

## 2012-12-27 DIAGNOSIS — E1149 Type 2 diabetes mellitus with other diabetic neurological complication: Secondary | ICD-10-CM

## 2012-12-27 MED ORDER — LORAZEPAM 0.5 MG PO TABS: ORAL_TABLET | ORAL | Status: DC

## 2012-12-27 MED ORDER — GABAPENTIN 300 MG PO CAPS: ORAL_CAPSULE | ORAL | Status: DC

## 2012-12-27 MED ORDER — ASPIRIN 81 MG PO CHEW: 81.0000 mg | CHEWABLE_TABLET | Freq: Every day | ORAL | Status: DC

## 2012-12-27 MED ORDER — METFORMIN HCL 1000 MG PO TABS: 1000.0000 mg | ORAL_TABLET | Freq: Every day | ORAL | Status: DC

## 2012-12-27 MED ORDER — ATENOLOL 25 MG PO TABS: 25.0000 mg | ORAL_TABLET | Freq: Every day | ORAL | Status: DC

## 2012-12-27 MED ORDER — GLIPIZIDE 5 MG PO TABS: 5.0000 mg | ORAL_TABLET | Freq: Every day | ORAL | Status: DC

## 2012-12-27 MED ORDER — ATORVASTATIN CALCIUM 40 MG PO TABS: 40.0000 mg | ORAL_TABLET | Freq: Every day | ORAL | Status: DC

## 2012-12-27 MED ORDER — TRAMADOL HCL 50 MG PO TABS: 50.0000 mg | ORAL_TABLET | Freq: Three times a day (TID) | ORAL | Status: DC | PRN

## 2012-12-27 MED ORDER — LOSARTAN POTASSIUM 50 MG PO TABS: 50.0000 mg | ORAL_TABLET | Freq: Every day | ORAL | Status: DC

## 2012-12-27 MED ORDER — ONETOUCH ULTRA 2 W/DEVICE KIT: 1.0000 | PACK | Freq: Three times a day (TID) | Status: DC

## 2012-12-27 MED ORDER — SUCRALFATE 1 GM/10ML PO SUSP: 1.0000 g | Freq: Four times a day (QID) | ORAL | Status: DC | PRN

## 2012-12-27 MED ORDER — PANTOPRAZOLE SODIUM 40 MG PO TBEC: 40.0000 mg | DELAYED_RELEASE_TABLET | Freq: Every day | ORAL | Status: DC

## 2012-12-27 MED ORDER — GLUCOSE BLOOD VI STRP: ORAL_STRIP | Status: DC

## 2012-12-27 MED ORDER — HYDROCHLOROTHIAZIDE 12.5 MG PO CAPS: ORAL_CAPSULE | ORAL | Status: DC

## 2012-12-27 NOTE — Progress Notes (Signed)
   Subjective:    Patient ID: [name], female    DOB: Jan 05, 1951, 62 y.o.   MRN: [id number]  HPI 62 yo F presents for f/u visit to discuss the following:  1. DM2: checking CBGs. Recent A1c done at home health visit was 7.6. Denies hypoglycemia. Has tingling and pain in feet. Compliant with metformin and glipizide as well as ARB, statin and ASA. Has a request for diabetic shoes and a new onetouch ultra 2 meter.   2. Poor sleep: x 4 moths. Trouble falling asleep. Goes to bed at 9/10 PM and is up until 2/3 AM. Smokes before bed. Snores. Sleeps with her husband. Watches TV and reads in bed. Has stopped exercising the morning. Reports increased anxiety related to taking her husband back and forth to doctor visits regarding his back pain.   3. Anxiety: waxes and wanes. Patient takes ativan BID. Open to changing to long acting agent. We dicussed that a change would be better as she gets older. Plan to switch to SSRI before age 30.   4. Smoker: she smokes 12-15 cigs per day. She is contemplative about quitting. She is not ready to quit.   Review of Systems As per HPI     Objective:   Physical Exam BP 129/82  Pulse 91  Temp(Src) 98.7 F (37.1 C) (Oral)  Wt 201 lb 9.6 oz (91.445 kg) General appearance: alert, cooperative and no distress Throat: lips, mucosa, and tongue normal; teeth (upper and lower dentures) and gums normal Neck: no adenopathy, no carotid bruit, no JVD, supple, symmetrical, trachea midline and thyroid not enlarged, symmetric, no tenderness/mass/nodules Lungs: clear to auscultation bilaterally Heart: regular rate and rhythm, S1, S2 normal, no murmur, click, rub or gallop  Lab Results  Component Value Date   HGBA1C 7.6* 11/22/2012       Assessment & Plan:

## 2012-12-27 NOTE — Patient Instructions (Signed)
Mrs. Dorado,  Thank you very much for coming in to see me today. Thank you for the lovely holiday card! I am blessed to be your primary doctor.  Poor sleep will cause pain.  For sleep: 1. Remove TV and reading from bedroom 2. Start exercise in the morning again  3. Eat before 8 PM 4. If you are not asleep within 30 mins go back to the living room for reading.  5. Do your best to not smoke after 8 PM.   Smoking cessation support: smoking cessation hotline: 1-800-QUIT-NOW.  Here is the number to the smoking cessation classes at [facility]: 308-6578  Goal weight loss: 10 lbs   See me in 2 months for repeat A1c and sleep follow up. If not better at this time I will send you for a sleep study.  Merry Christmas! Dr. Armen Pickup

## 2012-12-27 NOTE — Assessment & Plan Note (Signed)
A: stable. P: continue BZ with plan to transition to long acting before age 62.

## 2012-12-27 NOTE — Assessment & Plan Note (Signed)
A: A1c well controlled. Taking gabapentin for neuropathy.  Meds: compliant.  P: Continue current regimen Due for LDL

## 2012-12-27 NOTE — Assessment & Plan Note (Addendum)
A:  Contemplative about quitting P: offered counseling.

## 2012-12-28 ENCOUNTER — Ambulatory Visit (INDEPENDENT_AMBULATORY_CARE_PROVIDER_SITE_OTHER): Payer: Medicare Other

## 2012-12-28 VITALS — BP 148/80 | HR 100 | Resp 12

## 2012-12-28 DIAGNOSIS — E1140 Type 2 diabetes mellitus with diabetic neuropathy, unspecified: Secondary | ICD-10-CM

## 2012-12-28 DIAGNOSIS — B351 Tinea unguium: Secondary | ICD-10-CM

## 2012-12-28 DIAGNOSIS — M79609 Pain in unspecified limb: Secondary | ICD-10-CM

## 2012-12-28 DIAGNOSIS — E1142 Type 2 diabetes mellitus with diabetic polyneuropathy: Secondary | ICD-10-CM

## 2012-12-28 DIAGNOSIS — M2040 Other hammer toe(s) (acquired), unspecified foot: Secondary | ICD-10-CM

## 2012-12-28 DIAGNOSIS — E1149 Type 2 diabetes mellitus with other diabetic neurological complication: Secondary | ICD-10-CM

## 2012-12-28 NOTE — Patient Instructions (Signed)
Diabetes and Foot Care Diabetes may cause you to have problems because of poor blood supply (circulation) to your feet and legs. This may cause the skin on your feet to become thinner, break easier, and heal more slowly. Your skin may become dry, and the skin may peel and crack. You may also have nerve damage in your legs and feet causing decreased feeling in them. You may not notice minor injuries to your feet that could lead to infections or more serious problems. Taking care of your feet is one of the most important things you can do for yourself.  HOME CARE INSTRUCTIONS  Wear shoes at all times, even in the house. Do not go barefoot. Bare feet are easily injured.  Check your feet daily for blisters, cuts, and redness. If you cannot see the bottom of your feet, use a mirror or ask someone for help.  Wash your feet with warm water (do not use hot water) and mild soap. Then pat your feet and the areas between your toes until they are completely dry. Do not soak your feet as this can dry your skin.  Apply a moisturizing lotion or petroleum jelly (that does not contain alcohol and is unscented) to the skin on your feet and to dry, brittle toenails. Do not apply lotion between your toes.  Trim your toenails straight across. Do not dig under them or around the cuticle. File the edges of your nails with an emery board or nail file.  Do not cut corns or calluses or try to remove them with medicine.  Wear clean socks or stockings every day. Make sure they are not too tight. Do not wear knee-high stockings since they may decrease blood flow to your legs.  Wear shoes that fit properly and have enough cushioning. To break in new shoes, wear them for just a few hours a day. This prevents you from injuring your feet. Always look in your shoes before you put them on to be sure there are no objects inside.  Do not cross your legs. This may decrease the blood flow to your feet.  If you find a minor scrape,  cut, or break in the skin on your feet, keep it and the skin around it clean and dry. These areas may be cleansed with mild soap and water. Do not cleanse the area with peroxide, alcohol, or iodine.  When you remove an adhesive bandage, be sure not to damage the skin around it.  If you have a wound, look at it several times a day to make sure it is healing.  Do not use heating pads or hot water bottles. They may burn your skin. If you have lost feeling in your feet or legs, you may not know it is happening until it is too late.  Make sure your health care provider performs a complete foot exam at least annually or more often if you have foot problems. Report any cuts, sores, or bruises to your health care provider immediately. SEEK MEDICAL CARE IF:   You have an injury that is not healing.  You have cuts or breaks in the skin.  You have an ingrown nail.  You notice redness on your legs or feet.  You feel burning or tingling in your legs or feet.  You have pain or cramps in your legs and feet.  Your legs or feet are numb.  Your feet always feel cold. SEEK IMMEDIATE MEDICAL CARE IF:   There is increasing redness,   swelling, or pain in or around a wound.  There is a red line that goes up your leg.  Pus is coming from a wound.  You develop a fever or as directed by your health care provider.  You notice a bad smell coming from an ulcer or wound. Document Released: 12/27/1999 Document Revised: 08/31/2012 Document Reviewed: 06/07/2012 [employer] Patient Information 2014 [employer].  

## 2012-12-28 NOTE — Progress Notes (Signed)
   Subjective:    Patient ID: [name], female    DOB: [birth year], 62 y.o.   MRN: [id number]  HPI Comments: '' toenails trim and wants new pair diabetic shoes.''  Diabetes      Review of Systems  All other systems reviewed and are negative.       Objective:   Physical Exam Neurovascular status as follows pedal pulses palpable but diminished DP postal for PT one over 4 bilateral decreased Refill timed 3-4 seconds all digits. Skin temperature warm turgor normal no edema rubor pallor or varicosities noted. Neurologically epicritic and proprioceptive sensations diminished on Semmes Weinstein testing to the digits and plantar forefoot. Patient does have some keratoses HD 5 bilateral with hammertoe deformities semirigid in nature. Orthopedic exam otherwise unremarkable noncontributory hallux is relatively rectus although slight contracture noted no open wounds ulcers or infections noted nails thick brittle crumbly discolored friable 1 through 5 bilateral after last visit here a small section of her left hallux nail fell off however there is no bleeding no discomfort no pain likely dead necrotic nail that fell off. Patient does have diabetic shoes needs the replacements at this time authorization from primary physician have been obtained we'll arrange for new diabetic accident shoes and custom insoles at her convenience.       Assessment & Plan:  Diabetes with peripheral neuropathy. Plan debridement of dystrophic criptotic incurvated nails 1 through 5 bilateral. Nails debrided this time with minimal discomfort or pain also this time we will obtain authorization for diabetic extra-depth shoes with custom molded dual density Plastizote inlays. Followup in 3 months for continued palliative care patient be contacted when shoes ready for fitting and dispensing.  Alvan Dame DPM

## 2012-12-31 ENCOUNTER — Emergency Department (HOSPITAL_COMMUNITY): Payer: Medicare Other

## 2012-12-31 ENCOUNTER — Encounter (HOSPITAL_COMMUNITY): Payer: Self-pay | Admitting: Emergency Medicine

## 2012-12-31 ENCOUNTER — Emergency Department (HOSPITAL_COMMUNITY)
Admission: EM | Admit: 2012-12-31 | Discharge: 2012-12-31 | Disposition: A | Discharge status: Home / Self Care | Payer: Medicare Other | Attending: Emergency Medicine | Admitting: Emergency Medicine

## 2012-12-31 DIAGNOSIS — Z872 Personal history of diseases of the skin and subcutaneous tissue: Secondary | ICD-10-CM | POA: Insufficient documentation

## 2012-12-31 DIAGNOSIS — R12 Heartburn: Secondary | ICD-10-CM

## 2012-12-31 DIAGNOSIS — I10 Essential (primary) hypertension: Secondary | ICD-10-CM | POA: Insufficient documentation

## 2012-12-31 DIAGNOSIS — F17200 Nicotine dependence, unspecified, uncomplicated: Secondary | ICD-10-CM | POA: Insufficient documentation

## 2012-12-31 DIAGNOSIS — Z8669 Personal history of other diseases of the nervous system and sense organs: Secondary | ICD-10-CM | POA: Insufficient documentation

## 2012-12-31 DIAGNOSIS — E119 Type 2 diabetes mellitus without complications: Secondary | ICD-10-CM | POA: Insufficient documentation

## 2012-12-31 DIAGNOSIS — E785 Hyperlipidemia, unspecified: Secondary | ICD-10-CM | POA: Insufficient documentation

## 2012-12-31 DIAGNOSIS — Z79899 Other long term (current) drug therapy: Secondary | ICD-10-CM | POA: Insufficient documentation

## 2012-12-31 DIAGNOSIS — R079 Chest pain, unspecified: Secondary | ICD-10-CM

## 2012-12-31 DIAGNOSIS — R072 Precordial pain: Secondary | ICD-10-CM | POA: Insufficient documentation

## 2012-12-31 DIAGNOSIS — F411 Generalized anxiety disorder: Secondary | ICD-10-CM | POA: Insufficient documentation

## 2012-12-31 DIAGNOSIS — Z7982 Long term (current) use of aspirin: Secondary | ICD-10-CM | POA: Insufficient documentation

## 2012-12-31 DIAGNOSIS — K219 Gastro-esophageal reflux disease without esophagitis: Secondary | ICD-10-CM | POA: Insufficient documentation

## 2012-12-31 DIAGNOSIS — Z880 Allergy status to penicillin: Secondary | ICD-10-CM | POA: Insufficient documentation

## 2012-12-31 LAB — POCT I-STAT TROPONIN I: Troponin i, poc: 0 ng/mL (ref 0.00–0.08)

## 2012-12-31 LAB — BASIC METABOLIC PANEL
CO2: 25 mEq/L (ref 19–32)
Calcium: 9 mg/dL (ref 8.4–10.5)
GFR calc Af Amer: >90 mL/min (ref >90)
Glucose, Bld: 153 mg/dL — ABNORMAL HIGH (ref 70–99)
Sodium: 140 mEq/L (ref 135–145)

## 2012-12-31 LAB — CBC
Hemoglobin: 14.8 g/dL (ref 12.0–15.0)
MCH: 32.8 pg (ref 26.0–34.0)
RBC: 4.51 MIL/uL (ref 3.87–5.11)
RDW: 13.4 % (ref 11.5–15.5)

## 2012-12-31 NOTE — ED Provider Notes (Signed)
Medical screening examination/treatment/procedure(s) were performed by non-physician practitioner and as supervising physician I was immediately available for consultation/collaboration.  EKG Interpretation    Date/Time:  Saturday December 31 2012 05:33:24 EST Ventricular Rate:  81 PR Interval:  158 QRS Duration: 88 QT Interval:  370 QTC Calculation: 429 R Axis:   34 Text Interpretation:  Normal sinus rhythm T wave abnormality, consider anterior ischemia Abnormal ECG No significant change since last tracing Confirmed by Landon Truax  MD, Lainee Lehrman ([id number]) on 12/31/2012 7:04:21 AM             Olivia Mackie, MD 12/31/12 0730

## 2012-12-31 NOTE — ED Notes (Signed)
Pt. reports intermittent mid chest pain onset last night , denies SOB, nausea or diaphoresis .

## 2012-12-31 NOTE — ED Notes (Signed)
Pt. C/o of a dull left sided chest pain she noticed when she woke up. Pt. States she had gas before she went to bed last night so she took gas-x. Pt. Burping on assessment. PT. Reports hx of afib.

## 2012-12-31 NOTE — ED Provider Notes (Addendum)
CSN: [id number]     Arrival date & time 12/31/12  0531 History   First MD Initiated Contact with Patient 12/31/12 [phone]     Chief Complaint  Patient presents with  . Chest Pain   (Consider location/radiation/quality/duration/timing/severity/associated sxs/prior Treatment) HPI Comments: Patient is a 62 year old female with a past medical history of anxiety, diabetes, hypertension, GERD, tobacco use and fibrillation who presents to the emergency department complaining of nonradiating midsternal chest pain beginning last night shortly after eating dinner. Patient states that time to digest her food, developed a midsternal chest pain similar to her prior reflux, took 2 antacids a lot of burping she felt better. The pain was intermittent throughout the night until this morning when it completely subsided. Pain relieved by burping each time it occured, worsened by leaning towards her left side. Denies shortness of breath, abdominal pain, nausea, vomiting, diaphoresis, headaches, lightheadedness or weakness.  Patient is a 62 y.o. female presenting with chest pain. The history is provided by the patient.  Chest Pain   Past Medical History  Diagnosis Date  . Anxiety   . Cataract   . DM2 (diabetes mellitus, type 2)   . Hyperlipidemia   . Hypertension     essential, benign   . Skin lesion   . Insomnia   . Weakness   . Screening for malignant neoplasm of the cervix   . Tobacco abuse   . Palpitations     hx  . Routine general medical examination at a health care facility   . Dyslipidemia   . Disturbance of skin sensation     facial paresthesia; left  . Menopausal syndrome   . GERD (gastroesophageal reflux disease)   . AR (allergic rhinitis)    Past Surgical History  Procedure Laterality Date  . Abdominal hysterectomy  1990    For fibroids benign.  Still has ovaries  . Appendectomy    . Dental extractions    . Sigmoidoscopy  2004  . Colonoscopy  2009  . Ablation for svt  9/09    Dr.  Ladona Ridgel   Family History  Problem Relation Age of Onset  . Coronary artery disease      female, 1st degree relative <50  . Heart attack Father 15  . Hypertension Mother   . Stroke Mother   . Diabetes Mother    History  Substance Use Topics  . Smoking status: Current Every Day Smoker -- 0.80 packs/day    Types: Cigarettes  . Smokeless tobacco: Never Used  . Alcohol Use: No   OB History   Grav Para Term Preterm Abortions TAB SAB Ect Mult Living                 Review of Systems  Cardiovascular: Positive for chest pain.  Gastrointestinal:       Positive for increased burping.  All other systems reviewed and are negative.    Allergies  Alcohol swabs; Amoxicillin-pot clavulanate; and Penicillins  Home Medications   Current Outpatient Rx  Name  Route  Sig  Dispense  Refill  . aspirin 81 MG chewable tablet   Oral   Chew 1 tablet (81 mg total) by mouth daily.   100 tablet   0   . atenolol (TENORMIN) 25 MG tablet   Oral   Take 1 tablet (25 mg total) by mouth daily.   90 tablet   3   . atorvastatin (LIPITOR) 40 MG tablet   Oral   Take 1 tablet (40 mg  total) by mouth daily.   90 tablet   1   . Bepotastine Besilate (BEPREVE) 1.5 % SOLN   Both Eyes   Place 1 drop into both eyes 2 (two) times daily. Dr. Irena Cords, Allergy and Immunology         . gabapentin (NEURONTIN) 300 MG capsule   Oral   Take 600-900 mg by mouth 3 (three) times daily. Take 2 tablets every morning then take 3 capsules at lunch then take 2 capsules every evering         . glipiZIDE (GLUCOTROL) 5 MG tablet   Oral   Take 1 tablet (5 mg total) by mouth daily.   90 tablet   1   . hydrochlorothiazide (MICROZIDE) 12.5 MG capsule   Oral   Take 12.5 mg by mouth daily.         Marland Kitchen levalbuterol (XOPENEX HFA) 45 MCG/ACT inhaler   Inhalation   Inhale 2 puffs into the lungs every 4 (four) hours as needed for wheezing or shortness of breath. Dr. Irena Cords, Allergy and Immunology         .  LORazepam (ATIVAN) 0.5 MG tablet   Oral   Take 0.5 mg by mouth 2 (two) times daily as needed for anxiety.         Marland Kitchen losartan (COZAAR) 50 MG tablet   Oral   Take 1 tablet (50 mg total) by mouth daily.   90 tablet   1   . meclizine (ANTIVERT) 12.5 MG tablet   Oral   Take 1 tablet (12.5 mg total) by mouth 3 (three) times daily as needed. if having symptoms.  May repeat in 12 hours later.   30 tablet   6   . metFORMIN (GLUCOPHAGE) 1000 MG tablet   Oral   Take 1 tablet (1,000 mg total) by mouth at bedtime.   90 tablet   1   . mometasone (NASONEX) 50 MCG/ACT nasal spray   Nasal   Place 2 sprays into the nose daily.   17 g   3   . Olopatadine HCl (PATANASE) 0.6 % SOLN   Other   1 puff by Other route every morning.          . pantoprazole (PROTONIX) 40 MG tablet   Oral   Take 1 tablet (40 mg total) by mouth daily.   90 tablet   1   . sucralfate (CARAFATE) 1 GM/10ML suspension   Oral   Take 10 mLs (1 g total) by mouth 4 (four) times daily as needed (reflux).   420 mL   0   . traMADol (ULTRAM) 50 MG tablet   Oral   Take 1 tablet (50 mg total) by mouth every 8 (eight) hours as needed.   270 tablet   1   . Blood Glucose Monitoring Suppl (ONE TOUCH ULTRA 2) W/DEVICE KIT   Does not apply   1 Device by Does not apply route 3 (three) times daily.   1 each   0   . Blood Pressure Monitoring (BLOOD PRESSURE CUFF) MISC      Use as directed to monitor blood pressure   1 each   0   . glucose blood test strip      Use for three times daily testing   100 each   12   . Lancets (ONETOUCH ULTRASOFT) lancets      USE AS DIRECTED   100 each   11    BP 140/81  Pulse 71  Temp(Src) 98.9 F (37.2 C) (Oral)  Resp 18  SpO2 100% Physical Exam  Nursing note and vitals reviewed. Constitutional: She is oriented to person, place, and time. She appears well-developed and well-nourished. No distress.  HENT:  Head: Normocephalic and atraumatic.  Mouth/Throat: Oropharynx  is clear and moist.  Eyes: Conjunctivae and EOM are normal. Pupils are equal, round, and reactive to light.  Neck: Normal range of motion. Neck supple.  Cardiovascular: Normal rate, regular rhythm and normal heart sounds.   No extremity edema.  Pulmonary/Chest: Effort normal and breath sounds normal. She exhibits no tenderness.  Abdominal: Soft. Normal appearance and bowel sounds are normal. There is tenderness (mild) in the epigastric area. There is no rigidity, no rebound and no guarding.  No peritoneal signs.  Musculoskeletal: Normal range of motion. She exhibits no edema.  Neurological: She is alert and oriented to person, place, and time. She has normal strength. No sensory deficit.  Skin: Skin is warm and dry. She is not diaphoretic.  Psychiatric: She has a normal mood and affect. Her behavior is normal.    ED Course  Procedures (including critical care time) Labs Review Labs Reviewed  BASIC METABOLIC PANEL - Abnormal; Notable for the following:    Glucose, Bld 153 (*)    Creatinine, Ser 0.49 (*)    All other components within normal limits  CBC  POCT I-STAT TROPONIN I   Imaging Review Dg Chest 2 View  12/31/2012   CLINICAL DATA:  Chest pain  EXAM: CHEST  2 VIEW  COMPARISON:  02/22/2012  FINDINGS: The heart size and mediastinal contours are within normal limits. Minimal interstitial prominence at the left base is chronic, likely atelectasis or scarring. No consolidation or edema. No effusion or pneumothorax. The visualized skeletal structures are unremarkable.  IMPRESSION: No active cardiopulmonary disease.   Electronically Signed   By: Tiburcio Pea M.D.   On: 12/31/2012 06:32    EKG Interpretation    Date/Time:  Saturday December 31 2012 05:33:24 EST Ventricular Rate:  81 PR Interval:  158 QRS Duration: 88 QT Interval:  370 QTC Calculation: 429 R Axis:   34 Text Interpretation:  Normal sinus rhythm T wave abnormality, consider anterior ischemia Abnormal ECG No  significant change since last tracing Confirmed by OTTER  MD, OLGA ([id number]) on 12/31/2012 7:04:21 AM            MDM   1. Chest pain   2. Heartburn    Pt presenting with CP similar to her prior reflux. She is well appearing, NAD, normal VS. No chest pain during ED encounter. No associated symptoms other than burping. EKG unchanged from prior. Labs obtained in triage prior to patient being seen, normal. Troponin negative. CXR clear. Doubt cardiac in origin, HEART score 3, low risk. No pleuritic symptoms, doubt PE. Stable for discharge home, f/u with PCP. Return precautions given. Patient states understanding of treatment care plan and is agreeable.     Trevor Mace, PA-C 12/31/12 2130  Trevor Mace, PA-C 12/31/12 [phone]

## 2012-12-31 NOTE — ED Notes (Signed)
PA at bedside.

## 2013-01-01 NOTE — ED Provider Notes (Signed)
Medical screening examination/treatment/procedure(s) were performed by non-physician practitioner and as supervising physician I was immediately available for consultation/collaboration.  EKG Interpretation    Date/Time:  Saturday December 31 2012 05:33:24 EST Ventricular Rate:  81 PR Interval:  158 QRS Duration: 88 QT Interval:  370 QTC Calculation: 429 R Axis:   34 Text Interpretation:  Normal sinus rhythm T wave abnormality, consider anterior ischemia Abnormal ECG No significant change since last tracing Confirmed by Javarious Elsayed  MD, Eljay Lave ([id number]) on 12/31/2012 7:04:21 AM             Olivia Mackie, MD 01/01/13 0003

## 2013-01-02 ENCOUNTER — Encounter: Payer: Self-pay | Admitting: Family Medicine

## 2013-01-17 ENCOUNTER — Telehealth: Payer: Self-pay | Admitting: Family Medicine

## 2013-01-17 NOTE — Telephone Encounter (Signed)
Handicapped License plate form to be completed by [employer].

## 2013-01-20 NOTE — Telephone Encounter (Signed)
Checked box, no handicap licenses plate form in box.  Perhaps, Dr. Mingo Amber signed in my absence?

## 2013-01-20 NOTE — Telephone Encounter (Signed)
Form filled out and placed in your box.  Sorry about that.  Latasia Silberstein,CMA

## 2013-01-20 NOTE — Telephone Encounter (Signed)
No problem. Thank you for filling it out.  Form filled out and placed up front for pick up. Please call patient.

## 2013-01-24 ENCOUNTER — Telehealth: Payer: Self-pay | Admitting: *Deleted

## 2013-01-24 NOTE — Telephone Encounter (Signed)
Patient called and asked if her doctor had sent over paperwork for Diabetic Shoes.  I left her a message that we have not received the papers yet.

## 2013-01-26 ENCOUNTER — Other Ambulatory Visit: Payer: Self-pay | Admitting: Family Medicine

## 2013-01-26 ENCOUNTER — Telehealth: Payer: Self-pay | Admitting: Family Medicine

## 2013-01-26 NOTE — Telephone Encounter (Signed)
Form placed in provider's box.  Mistey Hoffert,CMA  

## 2013-01-26 NOTE — Telephone Encounter (Signed)
Pt called and wanted to know when she could pick up the forms that Dr. Adrian Blackwater had to fill out. She said that the first one was not the right form and that it had to be re-done. Please call when ready. jw

## 2013-01-26 NOTE — Telephone Encounter (Signed)
Form signed and ready to be picked up. Please inform patient.

## 2013-01-26 NOTE — Telephone Encounter (Signed)
LM informing pt of this. [name],CMA

## 2013-02-03 ENCOUNTER — Ambulatory Visit: Payer: Medicare Other

## 2013-02-21 ENCOUNTER — Telehealth: Payer: Self-pay | Admitting: *Deleted

## 2013-02-21 NOTE — Telephone Encounter (Signed)
Pt asked if her diabetic shoes were in.  I told her they were not, it could take 4 - 6 weeks.

## 2013-02-24 ENCOUNTER — Ambulatory Visit (INDEPENDENT_AMBULATORY_CARE_PROVIDER_SITE_OTHER): Payer: Medicare Other | Admitting: Internal Medicine

## 2013-02-24 ENCOUNTER — Encounter: Payer: Self-pay | Admitting: Internal Medicine

## 2013-02-24 VITALS — BP 128/84 | HR 87 | Ht 65.0 in | Wt 200.0 lb

## 2013-02-24 DIAGNOSIS — R1310 Dysphagia, unspecified: Secondary | ICD-10-CM

## 2013-02-24 DIAGNOSIS — F17200 Nicotine dependence, unspecified, uncomplicated: Secondary | ICD-10-CM

## 2013-02-24 DIAGNOSIS — I10 Essential (primary) hypertension: Secondary | ICD-10-CM

## 2013-02-24 NOTE — Assessment & Plan Note (Signed)
Her blood pressure is well controlled. No change in meds. I have asked the patient to lose weight.

## 2013-02-24 NOTE — Patient Instructions (Signed)
Your physician wants you to follow-up in: 12 months with Dr Knox Saliva will receive a reminder letter in the mail two months in advance. If you don't receive a letter, please call our office to schedule the follow-up appointment.  You have been referred to Dr Perry---difficulty swallowing

## 2013-02-24 NOTE — Progress Notes (Signed)
HPI Mrs. Rynders returns today for followup. She is a very pleasant middle-age woman with a history of SVT status post catheter ablation. She has had recurrent palpitations but no documented recurrent SVT. She has ongoing tobacco abuse and hypertension. She denies chest pain, shortness of breath, or syncope. She is been traveling to both [place] and [place]. She has minimal non-exertional chest pain. She notes some difficulty swallowing, particularly her medications.  Allergies  Allergen Reactions  . Alcohol Swabs [Isopropyl Alcohol] Nausea Only    Use peroxide instead  . Amoxicillin-Pot Clavulanate     REACTION: Unsure - maybe swollen?  Did not agree with her.  . Penicillins     REACTION: Unsure what reaction she has.     Current Outpatient Prescriptions  Medication Sig Dispense Refill  . aspirin 81 MG chewable tablet Chew 1 tablet (81 mg total) by mouth daily.  100 tablet  0  . atenolol (TENORMIN) 25 MG tablet Take 1 tablet (25 mg total) by mouth daily.  90 tablet  3  . atorvastatin (LIPITOR) 40 MG tablet Take 1 tablet (40 mg total) by mouth daily.  90 tablet  1  . Bepotastine Besilate (BEPREVE) 1.5 % SOLN Place 1 drop into both eyes 2 (two) times daily as needed. Dr. Harold Hedge, Allergy and Immunology      . gabapentin (NEURONTIN) 300 MG capsule Take 600-900 mg by mouth 2 (two) times daily as needed.       Marland Kitchen glipiZIDE (GLUCOTROL) 5 MG tablet Take 1 tablet (5 mg total) by mouth daily.  90 tablet  1  . hydrochlorothiazide (MICROZIDE) 12.5 MG capsule Take 12.5 mg by mouth daily.      Marland Kitchen levalbuterol (XOPENEX HFA) 45 MCG/ACT inhaler Inhale 2 puffs into the lungs every 4 (four) hours as needed for wheezing or shortness of breath. Dr. Harold Hedge, Allergy and Immunology      . LORazepam (ATIVAN) 0.5 MG tablet Take 0.5 mg by mouth 2 (two) times daily as needed for anxiety.      Marland Kitchen losartan (COZAAR) 50 MG tablet Take 1 tablet (50 mg total) by mouth daily.  90 tablet  1  . meclizine (ANTIVERT) 12.5  MG tablet Take 1 tablet (12.5 mg total) by mouth 3 (three) times daily as needed. if having symptoms.  May repeat in 12 hours later.  30 tablet  6  . metFORMIN (GLUCOPHAGE) 1000 MG tablet Take 1 tablet (1,000 mg total) by mouth at bedtime.  90 tablet  1  . mometasone (NASONEX) 50 MCG/ACT nasal spray Place 2 sprays into the nose daily.  17 g  3  . Olopatadine HCl (PATANASE) 0.6 % SOLN 1 puff by Other route every morning.       . pantoprazole (PROTONIX) 40 MG tablet Take 1 tablet (40 mg total) by mouth daily.  90 tablet  1  . sucralfate (CARAFATE) 1 GM/10ML suspension Take 10 mLs (1 g total) by mouth 4 (four) times daily as needed (reflux).  420 mL  0  . traMADol (ULTRAM) 50 MG tablet Take 1 tablet (50 mg total) by mouth every 8 (eight) hours as needed.  270 tablet  1   No current facility-administered medications for this visit.     Past Medical History  Diagnosis Date  . Anxiety   . Cataract   . DM2 (diabetes mellitus, type 2)   . Hyperlipidemia   . Hypertension     essential, benign   . Skin lesion   .  Insomnia   . Weakness   . Screening for malignant neoplasm of the cervix   . Tobacco abuse   . Palpitations     hx  . Routine general medical examination at a health care facility   . Dyslipidemia   . Disturbance of skin sensation     facial paresthesia; left  . Menopausal syndrome   . GERD (gastroesophageal reflux disease)   . AR (allergic rhinitis)     ROS:   All systems reviewed and negative except as noted in the HPI.   Past Surgical History  Procedure Laterality Date  . Abdominal hysterectomy  1990    For fibroids benign.  Still has ovaries  . Appendectomy    . Dental extractions    . Sigmoidoscopy  2004  . Colonoscopy  2009  . Ablation for svt  9/09    Dr. Lovena Le     Family History  Problem Relation Age of Onset  . Coronary artery disease      female, 1st degree relative <50  . Heart attack Father 76  . Hypertension Mother   . Stroke Mother   . Diabetes  Mother      History   Social History  . Marital Status: Married    Spouse Name: N/A    Number of Children: N/A  . Years of Education: N/A   Occupational History  . Not on file.   Social History Main Topics  . Smoking status: Current Every Day Smoker -- 0.80 packs/day    Types: Cigarettes  . Smokeless tobacco: Never Used  . Alcohol Use: No  . Drug Use: No  . Sexual Activity: Not on file   Other Topics Concern  . Not on file   Social History Narrative   Married, does not get regular exercise. On disability.    2 miscarriages, one at 4 and one at 5 months; death at 1 day, unsure of cause.      BP 128/84  Pulse 87  Ht 5\' 5"  (1.651 m)  Wt 200 lb (90.719 kg)  BMI 33.28 kg/m2  Physical Exam:  Well appearing obese, middle-age woman, NAD HEENT: Unremarkable Neck:  7 cm JVD, no thyromegally Lungs:  Clear with no wheezes, rales, or rhonchi. HEART:  Regular rate rhythm, no murmurs, no rubs, no clicks Abd:  soft, positive bowel sounds, no organomegally, no rebound, no guarding Ext:  2 plus pulses, no edema, no cyanosis, no clubbing Skin:  No rashes no nodules Neuro:  CN II through XII intact, motor grossly intact  EKG normal sinus rhythm with nonspecific ST-T wave abnormality.  Assess/Plan:

## 2013-02-24 NOTE — Assessment & Plan Note (Signed)
She is still smoking over a half a pack a day. I have asked her to consider stopping smoking.

## 2013-02-24 NOTE — Assessment & Plan Note (Signed)
She appears to have difficulyt swallowing her pills. I have asked the patient to followup with Dr. Henrene Pastor.

## 2013-03-06 ENCOUNTER — Encounter: Payer: Self-pay | Admitting: Family Medicine

## 2013-03-06 ENCOUNTER — Ambulatory Visit (INDEPENDENT_AMBULATORY_CARE_PROVIDER_SITE_OTHER): Payer: Medicare Other | Admitting: Family Medicine

## 2013-03-06 VITALS — BP 123/69 | HR 81 | Temp 99.0°F | Ht 65.0 in | Wt 200.0 lb

## 2013-03-06 DIAGNOSIS — F17200 Nicotine dependence, unspecified, uncomplicated: Secondary | ICD-10-CM

## 2013-03-06 DIAGNOSIS — E1149 Type 2 diabetes mellitus with other diabetic neurological complication: Secondary | ICD-10-CM

## 2013-03-06 DIAGNOSIS — I10 Essential (primary) hypertension: Secondary | ICD-10-CM

## 2013-03-06 DIAGNOSIS — R002 Palpitations: Secondary | ICD-10-CM

## 2013-03-06 DIAGNOSIS — E1140 Type 2 diabetes mellitus with diabetic neuropathy, unspecified: Secondary | ICD-10-CM

## 2013-03-06 DIAGNOSIS — E1142 Type 2 diabetes mellitus with diabetic polyneuropathy: Secondary | ICD-10-CM

## 2013-03-06 LAB — POCT GLYCOSYLATED HEMOGLOBIN (HGB A1C): Hemoglobin A1C: 7.8

## 2013-03-06 MED ORDER — ATORVASTATIN CALCIUM 40 MG PO TABS: 40.0000 mg | ORAL_TABLET | Freq: Every day | ORAL | Status: DC

## 2013-03-06 MED ORDER — SUCRALFATE 1 GM/10ML PO SUSP: 1.0000 g | Freq: Four times a day (QID) | ORAL | Status: DC | PRN

## 2013-03-06 MED ORDER — MECLIZINE HCL 12.5 MG PO TABS: 12.5000 mg | ORAL_TABLET | Freq: Three times a day (TID) | ORAL | Status: DC | PRN

## 2013-03-06 MED ORDER — ATENOLOL 25 MG PO TABS: 25.0000 mg | ORAL_TABLET | Freq: Every day | ORAL | Status: DC

## 2013-03-06 MED ORDER — ASPIRIN 81 MG PO CHEW: 81.0000 mg | CHEWABLE_TABLET | Freq: Every day | ORAL | Status: DC

## 2013-03-06 MED ORDER — GLIPIZIDE 5 MG PO TABS: 5.0000 mg | ORAL_TABLET | Freq: Every day | ORAL | Status: DC

## 2013-03-06 MED ORDER — GABAPENTIN 300 MG PO CAPS: 600.0000 mg | ORAL_CAPSULE | Freq: Two times a day (BID) | ORAL | Status: DC | PRN

## 2013-03-06 MED ORDER — MOMETASONE FUROATE 50 MCG/ACT NA SUSP: 2.0000 | Freq: Every day | NASAL | Status: DC

## 2013-03-06 MED ORDER — LOSARTAN POTASSIUM 50 MG PO TABS: 50.0000 mg | ORAL_TABLET | Freq: Every day | ORAL | Status: DC

## 2013-03-06 MED ORDER — HYDROCHLOROTHIAZIDE 12.5 MG PO CAPS: 12.5000 mg | ORAL_CAPSULE | Freq: Every day | ORAL | Status: DC

## 2013-03-06 MED ORDER — PANTOPRAZOLE SODIUM 40 MG PO TBEC: 40.0000 mg | DELAYED_RELEASE_TABLET | Freq: Every day | ORAL | Status: DC

## 2013-03-06 MED ORDER — TRAMADOL HCL 50 MG PO TABS: 50.0000 mg | ORAL_TABLET | Freq: Three times a day (TID) | ORAL | Status: DC | PRN

## 2013-03-06 MED ORDER — METFORMIN HCL 1000 MG PO TABS: 1000.0000 mg | ORAL_TABLET | Freq: Two times a day (BID) | ORAL | Status: DC

## 2013-03-06 NOTE — Progress Notes (Signed)
   Subjective:    Patient ID: [name], female    DOB: 10-Mar-1950, 63 y.o.   MRN: [id number]  HPI Patient presents today for follow-up on her diabetes.  Previous hgbA1C well controlled at 7.6.Patient states that blood glucose readings at home have been stable.  Patient takes Gabapetin 300mg  as needed for peripheral neuropathy pain in legs. Patient states that she is currently not experiencing any pain.      Attending Addendum  I examined the patient and discussed the assessment and plan with NP student Kathe Becton. I have HPI agree. My documentation is below:    Boykin Nearing, MD FAMILY MEDICINE TEACHING SERVICE   1. CHRONIC DIABETES  Disease Monitoring  Blood Sugar Ranges: 100-200 checks daily   Polyuria: no   Visual problems: no   Medication Compliance: yes  Medication Side Effects  Hypoglycemia: no   Preventitive Health Care  Eye Exam: not due  Foot Exam: done today   Diet pattern: good   Exercise: none     2. Smoking: 12 cigarettes daily. Not ready to quit. Plans to quit on her next birthday 07/11/2009.  Review of Systems Per history of present illness Ms. intermittent left side pain. This comes and goes and she has no pain currently.    Objective:   Physical Exam BP 123/69  Pulse 81  Temp(Src) 99 F (37.2 C) (Oral)  Ht 5\' 5"  (1.651 m)  Wt 200 lb (90.719 kg)  BMI 33.28 kg/m2 General appearance: alert, cooperative and no distress Lungs: clear to auscultation bilaterally Heart: regular rate and rhythm, S1, S2 normal, no murmur, click, rub or gallop Extremities: extremities normal, atraumatic, no cyanosis or edema Foot exam done and documented.   Lab Results  Component Value Date   HGBA1C 7.8 03/06/2013       Assessment & Plan:

## 2013-03-06 NOTE — Assessment & Plan Note (Signed)
A: slight decline with A1c on the rise. No weight gain. P: Increase metformin to BID Continue current treatment otherwise

## 2013-03-06 NOTE — Assessment & Plan Note (Signed)
Plans to quit on her birthday. She often says this. Provided info. Wants to quit cold [place].

## 2013-03-06 NOTE — Patient Instructions (Signed)
[name],  Thank you for coming in to see me today. Please continue all medications with a plan to increase metformin to 1000 mg BID.  Plan to f/u with me in 3-4 months for DM f/u.  Smoking cessation support: smoking cessation hotline: 1-800-QUIT-NOW.  Here is the number to the smoking cessation classes at [facility]: 665-9935   For your diet:  1. Make sure to eat breakfast, lunch and dinner (also may add one snack mid morning or mid afternoon).  2. Carbs: no more than 2 servings (30 gram/2oz) per meal and 1 serving per snack.  3. Exercise such that you sweat some and your heart rate goes up most days of the week.  4. Water, water, water  5. Check blood sugar daily to get a feel for how different foods effect your blood sugar:  Goal fasting <130 Goal after eating < 200

## 2013-03-08 ENCOUNTER — Telehealth: Payer: Self-pay | Admitting: *Deleted

## 2013-03-08 NOTE — Telephone Encounter (Signed)
APPOINTMENT HAS BEEN SCHEDULED FOR 03/14/13

## 2013-03-08 NOTE — Telephone Encounter (Signed)
Pt request the status of her Diabetic shoes.  Referred to schedulers to schedule a pick up appt with her doctor.

## 2013-03-14 ENCOUNTER — Ambulatory Visit (INDEPENDENT_AMBULATORY_CARE_PROVIDER_SITE_OTHER): Payer: Medicare Other

## 2013-03-14 VITALS — BP 157/82 | HR 87 | Resp 16

## 2013-03-14 DIAGNOSIS — E1149 Type 2 diabetes mellitus with other diabetic neurological complication: Secondary | ICD-10-CM

## 2013-03-14 DIAGNOSIS — E1140 Type 2 diabetes mellitus with diabetic neuropathy, unspecified: Secondary | ICD-10-CM

## 2013-03-14 DIAGNOSIS — B351 Tinea unguium: Secondary | ICD-10-CM

## 2013-03-14 DIAGNOSIS — M2040 Other hammer toe(s) (acquired), unspecified foot: Secondary | ICD-10-CM

## 2013-03-14 DIAGNOSIS — E1142 Type 2 diabetes mellitus with diabetic polyneuropathy: Secondary | ICD-10-CM

## 2013-03-14 DIAGNOSIS — M79609 Pain in unspecified limb: Secondary | ICD-10-CM

## 2013-03-14 NOTE — Progress Notes (Signed)
   Subjective:    Patient ID: [name], female    DOB: 12-24-1950, 63 y.o.   MRN: [id number]  HPI Comments: "I need the nails cut and pick up my shoes"  Diabetes      Review of Systems no new changes or findings at this time.     Objective:   Physical Exam Vascular status is intact with pedal pulses palpable DP postal for PT plus one over 4 bilateral capillary refill timed 3-4 seconds all digits skin temperature warm to cool turgor diminished epicritic and proprioceptive sensations decreased confirmed on Semmes Weinstein testing to forefoot and plantar digits. Nails thick brittle friable criptotic incurvated. Patient scheduled for new diabetic shoes at this time however the insoles fit and contour well however the shoes or at least one size too long we'll reorder the diabetic apex type shoe and appropriate size probably size 12 rather than a 13 women's. Patient recalled the week or 2 when shoes ready for refitting and dispensing.    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 Will dispense shoes and custom insoles at that time.   Harriet Masson DPM

## 2013-03-14 NOTE — Patient Instructions (Signed)
Diabetes and Foot Care Diabetes may cause you to have problems because of poor blood supply (circulation) to your feet and legs. This may cause the skin on your feet to become thinner, break easier, and heal more slowly. Your skin may become dry, and the skin may peel and crack. You may also have nerve damage in your legs and feet causing decreased feeling in them. You may not notice minor injuries to your feet that could lead to infections or more serious problems. Taking care of your feet is one of the most important things you can do for yourself.  HOME CARE INSTRUCTIONS  Wear shoes at all times, even in the house. Do not go barefoot. Bare feet are easily injured.  Check your feet daily for blisters, cuts, and redness. If you cannot see the bottom of your feet, use a mirror or ask someone for help.  Wash your feet with warm water (do not use hot water) and mild soap. Then pat your feet and the areas between your toes until they are completely dry. Do not soak your feet as this can dry your skin.  Apply a moisturizing lotion or petroleum jelly (that does not contain alcohol and is unscented) to the skin on your feet and to dry, brittle toenails. Do not apply lotion between your toes.  Trim your toenails straight across. Do not dig under them or around the cuticle. File the edges of your nails with an emery board or nail file.  Do not cut corns or calluses or try to remove them with medicine.  Wear clean socks or stockings every day. Make sure they are not too tight. Do not wear knee-high stockings since they may decrease blood flow to your legs.  Wear shoes that fit properly and have enough cushioning. To break in new shoes, wear them for just a few hours a day. This prevents you from injuring your feet. Always look in your shoes before you put them on to be sure there are no objects inside.  Do not cross your legs. This may decrease the blood flow to your feet.  If you find a minor scrape,  cut, or break in the skin on your feet, keep it and the skin around it clean and dry. These areas may be cleansed with mild soap and water. Do not cleanse the area with peroxide, alcohol, or iodine.  When you remove an adhesive bandage, be sure not to damage the skin around it.  If you have a wound, look at it several times a day to make sure it is healing.  Do not use heating pads or hot water bottles. They may burn your skin. If you have lost feeling in your feet or legs, you may not know it is happening until it is too late.  Make sure your health care provider performs a complete foot exam at least annually or more often if you have foot problems. Report any cuts, sores, or bruises to your health care provider immediately. SEEK MEDICAL CARE IF:   You have an injury that is not healing.  You have cuts or breaks in the skin.  You have an ingrown nail.  You notice redness on your legs or feet.  You feel burning or tingling in your legs or feet.  You have pain or cramps in your legs and feet.  Your legs or feet are numb.  Your feet always feel cold. SEEK IMMEDIATE MEDICAL CARE IF:   There is increasing redness,   swelling, or pain in or around a wound.  There is a red line that goes up your leg.  Pus is coming from a wound.  You develop a fever or as directed by your health care provider.  You notice a bad smell coming from an ulcer or wound. Document Released: 12/27/1999 Document Revised: 08/31/2012 Document Reviewed: 06/07/2012 [employer] Patient Information 2014 [employer].  

## 2013-03-24 ENCOUNTER — Ambulatory Visit: Payer: Medicare Other

## 2013-04-03 ENCOUNTER — Ambulatory Visit (INDEPENDENT_AMBULATORY_CARE_PROVIDER_SITE_OTHER): Payer: Medicare Other

## 2013-04-03 VITALS — BP 126/96 | HR 93 | Resp 18

## 2013-04-03 DIAGNOSIS — E1140 Type 2 diabetes mellitus with diabetic neuropathy, unspecified: Secondary | ICD-10-CM

## 2013-04-03 DIAGNOSIS — M79609 Pain in unspecified limb: Secondary | ICD-10-CM

## 2013-04-03 DIAGNOSIS — E1149 Type 2 diabetes mellitus with other diabetic neurological complication: Secondary | ICD-10-CM

## 2013-04-03 DIAGNOSIS — M2040 Other hammer toe(s) (acquired), unspecified foot: Secondary | ICD-10-CM

## 2013-04-03 NOTE — Patient Instructions (Signed)
Diabetes and Foot Care Diabetes may cause you to have problems because of poor blood supply (circulation) to your feet and legs. This may cause the skin on your feet to become thinner, break easier, and heal more slowly. Your skin may become dry, and the skin may peel and crack. You may also have nerve damage in your legs and feet causing decreased feeling in them. You may not notice minor injuries to your feet that could lead to infections or more serious problems. Taking care of your feet is one of the most important things you can do for yourself.  HOME CARE INSTRUCTIONS  Wear shoes at all times, even in the house. Do not go barefoot. Bare feet are easily injured.  Check your feet daily for blisters, cuts, and redness. If you cannot see the bottom of your feet, use a mirror or ask someone for help.  Wash your feet with warm water (do not use hot water) and mild soap. Then pat your feet and the areas between your toes until they are completely dry. Do not soak your feet as this can dry your skin.  Apply a moisturizing lotion or petroleum jelly (that does not contain alcohol and is unscented) to the skin on your feet and to dry, brittle toenails. Do not apply lotion between your toes.  Trim your toenails straight across. Do not dig under them or around the cuticle. File the edges of your nails with an emery board or nail file.  Do not cut corns or calluses or try to remove them with medicine.  Wear clean socks or stockings every day. Make sure they are not too tight. Do not wear knee-high stockings since they may decrease blood flow to your legs.  Wear shoes that fit properly and have enough cushioning. To break in new shoes, wear them for just a few hours a day. This prevents you from injuring your feet. Always look in your shoes before you put them on to be sure there are no objects inside.  Do not cross your legs. This may decrease the blood flow to your feet.  If you find a minor scrape,  cut, or break in the skin on your feet, keep it and the skin around it clean and dry. These areas may be cleansed with mild soap and water. Do not cleanse the area with peroxide, alcohol, or iodine.  When you remove an adhesive bandage, be sure not to damage the skin around it.  If you have a wound, look at it several times a day to make sure it is healing.  Do not use heating pads or hot water bottles. They may burn your skin. If you have lost feeling in your feet or legs, you may not know it is happening until it is too late.  Make sure your health care provider performs a complete foot exam at least annually or more often if you have foot problems. Report any cuts, sores, or bruises to your health care provider immediately. SEEK MEDICAL CARE IF:   You have an injury that is not healing.  You have cuts or breaks in the skin.  You have an ingrown nail.  You notice redness on your legs or feet.  You feel burning or tingling in your legs or feet.  You have pain or cramps in your legs and feet.  Your legs or feet are numb.  Your feet always feel cold. SEEK IMMEDIATE MEDICAL CARE IF:   There is increasing redness,   swelling, or pain in or around a wound.  There is a red line that goes up your leg.  Pus is coming from a wound.  You develop a fever or as directed by your health care provider.  You notice a bad smell coming from an ulcer or wound. Document Released: 12/27/1999 Document Revised: 08/31/2012 Document Reviewed: 06/07/2012 [employer] Patient Information 2014 [employer].  

## 2013-04-03 NOTE — Progress Notes (Signed)
   Subjective:    Patient ID: [name], female    DOB: 02/23/1950, 63 y.o.   MRN: [id number]  HPI I am here to get my diabetic shoes    Review of Systems no new changes or findings     Objective:   Physical Exam Neurovascular status is intact DP +2/4 bilateral PT one over 4 bilateral Refill time 4 seconds all digits skin temperature warm to cool turgor diminished. Epicritic and proprioceptive sensations diminished on Semmes Weinstein testing to forefoot and digits nails unremarkable just recently trimmed 1 pair shoes and 3 pairs of dual density Plastizote inlays are dispensed with wearing break in instructions reappointed 3 months for diabetic foot and palliative nail care in the future. The shoes and insoles fit and contour well to the patient's foot with full contact.       Assessment & Plan:  Assessment diabetes with complications digital contractures multiple keratoses multiple insoles and 1 pair shoes and 3 pairs of dual density Plastizote inlays are dispensed with wear instructions and use reappointed in 3 months for palliative care followup in the future the patient is advised she is able did initial diabetic shoes if she is interested in the future.  Harriet Masson DPM

## 2013-04-04 ENCOUNTER — Encounter: Payer: Self-pay | Admitting: Internal Medicine

## 2013-04-04 ENCOUNTER — Ambulatory Visit (INDEPENDENT_AMBULATORY_CARE_PROVIDER_SITE_OTHER): Payer: Medicare Other | Admitting: Internal Medicine

## 2013-04-04 VITALS — BP 120/72 | HR 86 | Ht 65.0 in | Wt 198.2 lb

## 2013-04-04 DIAGNOSIS — E1059 Type 1 diabetes mellitus with other circulatory complications: Secondary | ICD-10-CM

## 2013-04-04 DIAGNOSIS — R1310 Dysphagia, unspecified: Secondary | ICD-10-CM

## 2013-04-04 DIAGNOSIS — K219 Gastro-esophageal reflux disease without esophagitis: Secondary | ICD-10-CM

## 2013-04-04 NOTE — Patient Instructions (Signed)

## 2013-04-05 ENCOUNTER — Encounter: Payer: Medicare Other | Admitting: Internal Medicine

## 2013-04-06 ENCOUNTER — Encounter: Payer: Self-pay | Admitting: Internal Medicine

## 2013-04-06 NOTE — Progress Notes (Signed)
HISTORY OF PRESENT ILLNESS:  [name] is a 63 y.o. female with multiple medical problems as listed below. She presents today regarding GERD and dysphagia. Patient was last seen April 2009 4 routine screening colonoscopy. The examination was normal except for diminutive hyperplastic polyp in the rectosigmoid colon and small external hemorrhoids. Followup in 10 years recommended. Patient reports a 20 year history of problems with pyrosis. Symptoms are controlled when she takes PPI. Currently on pantoprazole 40 mg daily. She also reports a 5-10 year history of intermittent dysphagia item such as pills mostly (points to cervical esophagus) and occasionally solids. Never liquids. She has had weight gain. GI review of systems is otherwise negative REVIEW OF SYSTEMS:  All non-GI ROS negative except for sinus and allergy, anxiety, arthritis, back pain, fatigue, itching, muscle cramps in the leg, night sweats, day sweats, exertional shortness of breath mildly, insomnia  Past Medical History  Diagnosis Date  . Anxiety   . Cataract   . DM2 (diabetes mellitus, type 2)   . Hyperlipidemia   . Hypertension     essential, benign   . Skin lesion   . Insomnia   . Weakness   . Screening for malignant neoplasm of the cervix   . Tobacco abuse   . Palpitations     hx  . Routine general medical examination at a health care facility   . Dyslipidemia   . Disturbance of skin sensation     facial paresthesia; left  . Menopausal syndrome   . GERD (gastroesophageal reflux disease)   . AR (allergic rhinitis)   . Colon polyps     hyperplastic  . Hemorrhoids   . SVT (supraventricular tachycardia)     Past Surgical History  Procedure Laterality Date  . Abdominal hysterectomy  1990    For fibroids benign.  Still has ovaries  . Appendectomy    . Dental extractions    . Sigmoidoscopy  2004  . Colonoscopy  2009  . Ablation for svt  9/09    Dr. Lovena Le    Social History [name]  reports  that she has been smoking Cigarettes.  She has been smoking about 0.80 packs per day. She has never used smokeless tobacco. She reports that she does not drink alcohol or use illicit drugs.  family history includes Coronary artery disease in an other family member; Diabetes in her mother; Heart attack (age of onset: 39) in her father; Hypertension in her mother; Stomach cancer in her mother; Stroke in her mother.  Allergies  Allergen Reactions  . Alcohol Swabs [Isopropyl Alcohol] Nausea Only    Use peroxide instead  . Amoxicillin-Pot Clavulanate     REACTION: Unsure - maybe swollen?  Did not agree with her.  . Penicillins     REACTION: Unsure what reaction she has.       PHYSICAL EXAMINATION: Vital signs: BP 120/72  Pulse 86  Ht 5\' 5"  (1.651 m)  Wt 198 lb 3.2 oz (89.903 kg)  BMI 32.98 kg/m2 General: Obese, Well-developed, well-nourished, no acute distress HEENT: Sclerae are anicteric, conjunctiva pink. Oral mucosa intact Lungs: Clear Heart: Regular Abdomen: soft, obese, nontender, nondistended, no obvious ascites, no peritoneal signs, normal bowel sounds. No organomegaly. Extremities: No edema Psychiatric: alert and oriented x3. Cooperative     ASSESSMENT:  #1. Chronic GERD. Classic symptoms controlled with PPI #2. Dysphagia. Principally the pills. Question Zenker's. Question cervical web. Rule out stricture #3. Screening colonoscopy April 2009 negative for neoplasia  PLAN:  #  1. Reflux precautions #2. Continue PPI #3. Upper endoscopy with possible esophageal dilation.The nature of the procedure, as well as the risks, benefits, and alternatives were carefully and thoroughly reviewed with the patient. Ample time for discussion and questions allowed. The patient understood, was satisfied, and agreed to proceed. #4. Due for repeat screening colonoscopy around April 2019 #5. Hold diabetic medications the day of her examination to avoid and wanted hypoglycemia

## 2013-04-11 ENCOUNTER — Encounter: Payer: Self-pay | Admitting: Internal Medicine

## 2013-05-15 ENCOUNTER — Other Ambulatory Visit: Payer: Self-pay | Admitting: Family Medicine

## 2013-05-24 ENCOUNTER — Other Ambulatory Visit: Payer: Self-pay

## 2013-05-24 ENCOUNTER — Encounter: Payer: Self-pay | Admitting: Internal Medicine

## 2013-05-24 ENCOUNTER — Ambulatory Visit (AMBULATORY_SURGERY_CENTER): Payer: Medicare Other | Admitting: Internal Medicine

## 2013-05-24 VITALS — BP 129/73 | HR 70 | Temp 96.8°F | Resp 14 | Ht 65.0 in | Wt 200.0 lb

## 2013-05-24 DIAGNOSIS — K219 Gastro-esophageal reflux disease without esophagitis: Secondary | ICD-10-CM

## 2013-05-24 DIAGNOSIS — R1310 Dysphagia, unspecified: Secondary | ICD-10-CM

## 2013-05-24 DIAGNOSIS — R1319 Other dysphagia: Secondary | ICD-10-CM

## 2013-05-24 LAB — GLUCOSE, CAPILLARY
GLUCOSE-CAPILLARY: 128 mg/dL — AB (ref 70–99)
Glucose-Capillary: 130 mg/dL — ABNORMAL HIGH (ref 70–99)

## 2013-05-24 MED ORDER — SODIUM CHLORIDE 0.9 % IV SOLN: 500.0000 mL | INTRAVENOUS | Status: DC

## 2013-05-24 NOTE — Op Note (Signed)
[place]  [address] [place], [id number]   ENDOSCOPY PROCEDURE REPORT  PATIENT: Tammy, Rios  MR#: [id number] BIRTHDATE: July 04, 1950 , 62  yrs. old GENDER: Female ENDOSCOPIST: Eustace Quail, MD REFERRED BY:  .  Self / Office PROCEDURE DATE:  05/24/2013 PROCEDURE:  EGD, diagnostic ASA CLASS:     Class II INDICATIONS:  Dysphagia.   cervical esophageal dysphagia, mostly pill MEDICATIONS: MAC sedation, administered by CRNA and propofol (Diprivan) 100mg  IV TOPICAL ANESTHETIC: none  DESCRIPTION OF PROCEDURE: After the risks benefits and alternatives of the procedure were thoroughly explained, informed consent was obtained.  The LB [id number] endoscope was introduced through the mouth and advanced to the second portion of the duodenum. Without limitations.  The instrument was slowly withdrawn as the mucosa was fully examined.      EXAM :The upper, middle and distal third of the esophagus were carefully inspected and no abnormalities were noted.  The z-line was well seen at the GEJ.  The endoscope was pushed into the fundus which was normal including a retroflexed view.  The antrum, gastric body, first and second part of the duodenum were unremarkable. Retroflexed views revealed a hiatal hernia.     The scope was then withdrawn from the patient and the procedure completed.  COMPLICATIONS: There were no complications. ENDOSCOPIC IMPRESSION: 1. Normal EGD 2. GERD  RECOMMENDATIONS: 1.  Continue current medications 2.  My office will arrange for you to have a Barium Esophagram WITH TABLET performed.  This is a radiology test to examine your esophagus  " cervical esophageal dysphagia , negative EGD , evaluate ".  REPEAT EXAM:  eSigned:  Eustace Quail, MD 05/24/2013 11:48 AM   CC:The Patient   ; Boykin Nearing, MD (Family medicine clinic - [place])

## 2013-05-24 NOTE — Progress Notes (Signed)
A/ox3 pleased with MAC, report to Celia RN 

## 2013-05-24 NOTE — Patient Instructions (Signed)
Discharge instructions given with verbal understanding. Office will call and arrange for a Barium Esophagram with tablet. Resume previous medications. YOU HAD AN ENDOSCOPIC PROCEDURE TODAY AT [facility]: Refer to the procedure report that was given to you for any specific questions about what was found during the examination.  If the procedure report does not answer your questions, please call your gastroenterologist to clarify.  If you requested that your care partner not be given the details of your procedure findings, then the procedure report has been included in a sealed envelope for you to review at your convenience later.  YOU SHOULD EXPECT: Some feelings of bloating in the abdomen. Passage of more gas than usual.  Walking can help get rid of the air that was put into your GI tract during the procedure and reduce the bloating. If you had a lower endoscopy (such as a colonoscopy or flexible sigmoidoscopy) you may notice spotting of blood in your stool or on the toilet paper. If you underwent a bowel prep for your procedure, then you may not have a normal bowel movement for a few days.  DIET: Your first meal following the procedure should be a light meal and then it is ok to progress to your normal diet.  A half-sandwich or bowl of soup is an example of a good first meal.  Heavy or fried foods are harder to digest and may make you feel nauseous or bloated.  Likewise meals heavy in dairy and vegetables can cause extra gas to form and this can also increase the bloating.  Drink plenty of fluids but you should avoid alcoholic beverages for 24 hours.  ACTIVITY: Your care partner should take you home directly after the procedure.  You should plan to take it easy, moving slowly for the rest of the day.  You can resume normal activity the day after the procedure however you should NOT DRIVE or use heavy machinery for 24 hours (because of the sedation medicines used during the test).     SYMPTOMS TO REPORT IMMEDIATELY: A gastroenterologist can be reached at any hour.  During normal business hours, 8:30 AM to 5:00 PM Monday through Friday, call [phone].  After hours and on weekends, please call the GI answering service at [phone] who will take a message and have the physician on call contact you.   Following upper endoscopy (EGD)  Vomiting of blood or coffee ground material  New chest pain or pain under the shoulder blades  Painful or persistently difficult swallowing  New shortness of breath  Fever of 100F or higher  Black, tarry-looking stools  FOLLOW UP: If any biopsies were taken you will be contacted by phone or by letter within the next 1-3 weeks.  Call your gastroenterologist if you have not heard about the biopsies in 3 weeks.  Our staff will call the home number listed on your records the next business day following your procedure to check on you and address any questions or concerns that you may have at that time regarding the information given to you following your procedure. This is a courtesy call and so if there is no answer at the home number and we have not heard from you through the emergency physician on call, we will assume that you have returned to your regular daily activities without incident.  SIGNATURES/CONFIDENTIALITY: You and/or your care partner have signed paperwork which will be entered into your electronic medical record.  These signatures attest to the  fact that that the information above on your After Visit Summary has been reviewed and is understood.  Full responsibility of the confidentiality of this discharge information lies with you and/or your care-partner.

## 2013-05-25 ENCOUNTER — Telehealth: Payer: Self-pay | Admitting: Internal Medicine

## 2013-05-25 ENCOUNTER — Telehealth: Payer: Self-pay

## 2013-05-25 NOTE — Telephone Encounter (Signed)
  Follow up Call-  Call back number 05/24/2013  Post procedure Call Back phone  # [phone]  Permission to leave phone message Yes     Patient questions:  Do you have a fever, pain , or abdominal swelling? no Pain Score  0 *  Have you tolerated food without any problems? yes  Have you been able to return to your normal activities? yes  Do you have any questions about your discharge instructions: Diet   no Medications  no Follow up visit  no  Do you have questions or concerns about your Care? no  Actions: * If pain score is 4 or above: No action needed, pain <4.

## 2013-05-25 NOTE — Telephone Encounter (Signed)
Pt scheduled for barium esophagram with tablet at [facility] 05/31/13@9 :30am. Pt to arrive at 9:15am. Pt to be NPO after 6:30am. Pt aware of appt.

## 2013-05-25 NOTE — Telephone Encounter (Signed)
Pt wanted to know if she can take her medicine prior to her barium swallow. Discussed with pt that she can eat and drink until 6:30am and take her meds at that time. Pt aware.

## 2013-05-31 ENCOUNTER — Ambulatory Visit (HOSPITAL_COMMUNITY)
Admission: RE | Admit: 2013-05-31 | Discharge: 2013-05-31 | Disposition: A | Discharge status: Home / Self Care | Payer: Medicare Other | Source: Ambulatory Visit | Attending: Internal Medicine | Admitting: Internal Medicine

## 2013-05-31 DIAGNOSIS — R1319 Other dysphagia: Secondary | ICD-10-CM | POA: Insufficient documentation

## 2013-05-31 DIAGNOSIS — K449 Diaphragmatic hernia without obstruction or gangrene: Secondary | ICD-10-CM | POA: Insufficient documentation

## 2013-05-31 DIAGNOSIS — K224 Dyskinesia of esophagus: Secondary | ICD-10-CM | POA: Insufficient documentation

## 2013-06-07 ENCOUNTER — Ambulatory Visit (INDEPENDENT_AMBULATORY_CARE_PROVIDER_SITE_OTHER): Payer: Medicare Other | Admitting: Family Medicine

## 2013-06-07 ENCOUNTER — Encounter: Payer: Self-pay | Admitting: Family Medicine

## 2013-06-07 VITALS — BP 133/80 | HR 85 | Temp 98.6°F | Wt 196.0 lb

## 2013-06-07 DIAGNOSIS — E1140 Type 2 diabetes mellitus with diabetic neuropathy, unspecified: Secondary | ICD-10-CM

## 2013-06-07 DIAGNOSIS — Z113 Encounter for screening for infections with a predominantly sexual mode of transmission: Secondary | ICD-10-CM

## 2013-06-07 DIAGNOSIS — F17200 Nicotine dependence, unspecified, uncomplicated: Secondary | ICD-10-CM

## 2013-06-07 DIAGNOSIS — E1142 Type 2 diabetes mellitus with diabetic polyneuropathy: Secondary | ICD-10-CM

## 2013-06-07 DIAGNOSIS — E1149 Type 2 diabetes mellitus with other diabetic neurological complication: Secondary | ICD-10-CM

## 2013-06-07 DIAGNOSIS — R002 Palpitations: Secondary | ICD-10-CM

## 2013-06-07 DIAGNOSIS — I10 Essential (primary) hypertension: Secondary | ICD-10-CM

## 2013-06-07 DIAGNOSIS — B070 Plantar wart: Secondary | ICD-10-CM

## 2013-06-07 LAB — HEPATIC FUNCTION PANEL
ALT: 27 U/L (ref 0–35)
AST: 22 U/L (ref 0–37)
Albumin: 4 g/dL (ref 3.5–5.2)
Alkaline Phosphatase: 81 U/L (ref 39–117)
BILIRUBIN DIRECT: 0.2 mg/dL (ref 0.0–0.3)
BILIRUBIN INDIRECT: 0.4 mg/dL (ref 0.2–1.2)
Total Bilirubin: 0.6 mg/dL (ref 0.2–1.2)
Total Protein: 6.4 g/dL (ref 6.0–8.3)

## 2013-06-07 LAB — HEPATITIS C ANTIBODY, REFLEX: HCV Ab: NEGATIVE

## 2013-06-07 LAB — POCT GLYCOSYLATED HEMOGLOBIN (HGB A1C): Hemoglobin A1C: 7.3

## 2013-06-07 MED ORDER — MECLIZINE HCL 12.5 MG PO TABS: 12.5000 mg | ORAL_TABLET | Freq: Three times a day (TID) | ORAL | Status: DC | PRN

## 2013-06-07 MED ORDER — PANTOPRAZOLE SODIUM 40 MG PO TBEC: 40.0000 mg | DELAYED_RELEASE_TABLET | Freq: Every day | ORAL | Status: DC

## 2013-06-07 MED ORDER — MOMETASONE FUROATE 50 MCG/ACT NA SUSP: 2.0000 | Freq: Every day | NASAL | Status: DC

## 2013-06-07 MED ORDER — LORAZEPAM 0.5 MG PO TABS: 0.5000 mg | ORAL_TABLET | Freq: Two times a day (BID) | ORAL | Status: DC | PRN

## 2013-06-07 MED ORDER — ONETOUCH ULTRASOFT LANCETS MISC: Status: DC

## 2013-06-07 MED ORDER — ASPIRIN 81 MG PO CHEW: 81.0000 mg | CHEWABLE_TABLET | Freq: Every day | ORAL | Status: DC

## 2013-06-07 MED ORDER — ATENOLOL 25 MG PO TABS: 25.0000 mg | ORAL_TABLET | Freq: Every day | ORAL | Status: DC

## 2013-06-07 MED ORDER — TRAMADOL HCL 50 MG PO TABS: 50.0000 mg | ORAL_TABLET | Freq: Three times a day (TID) | ORAL | Status: DC | PRN

## 2013-06-07 MED ORDER — GABAPENTIN 300 MG PO CAPS: 600.0000 mg | ORAL_CAPSULE | Freq: Two times a day (BID) | ORAL | Status: DC | PRN

## 2013-06-07 MED ORDER — ATORVASTATIN CALCIUM 40 MG PO TABS: 40.0000 mg | ORAL_TABLET | Freq: Every day | ORAL | Status: DC

## 2013-06-07 MED ORDER — METFORMIN HCL 1000 MG PO TABS: 1000.0000 mg | ORAL_TABLET | Freq: Two times a day (BID) | ORAL | Status: DC

## 2013-06-07 MED ORDER — GLIPIZIDE 5 MG PO TABS: 5.0000 mg | ORAL_TABLET | Freq: Every day | ORAL | Status: DC

## 2013-06-07 MED ORDER — OLOPATADINE HCL 0.6 % NA SOLN: 1.0000 | Freq: Every morning | NASAL | Status: DC

## 2013-06-07 MED ORDER — TRIAMCINOLONE ACETONIDE 0.1 % EX CREA: TOPICAL_CREAM | Freq: Two times a day (BID) | CUTANEOUS | Status: DC

## 2013-06-07 MED ORDER — LOSARTAN POTASSIUM 50 MG PO TABS: 50.0000 mg | ORAL_TABLET | Freq: Every day | ORAL | Status: DC

## 2013-06-07 MED ORDER — HYDROCHLOROTHIAZIDE 12.5 MG PO CAPS: 12.5000 mg | ORAL_CAPSULE | Freq: Every day | ORAL | Status: DC

## 2013-06-07 MED ORDER — GLUCOSE BLOOD VI STRP: ORAL_STRIP | Status: DC

## 2013-06-07 NOTE — Progress Notes (Signed)
   Subjective:    Patient ID: [name], female    DOB: Aug 28, 1950, 63 y.o.   MRN: [id number] CC: DM  2 f/u  HPI 1. CHRONIC DIABETES  Disease Monitoring  Blood Sugar Ranges: 111-123  Polyuria: no   Visual problems: no   Medication Compliance: yes  Medication Side Effects  Hypoglycemia: no   Preventitive Health Care  Eye Exam: q August    Foot Exam: due in 02/2014   Diet pattern: good  Exercise: regular  2. Smoking: 1/2 PPD. Not ready to quit. Dx with asthma. Denies CP, SOB, wheezing. No using xopenex.   HM: request dexa scan for postmenopausal, on HCTZ, no history of longterm steroid use or fragility fracture. Due for one time HIV screen. Reported husband has liver cyst will also screen for hep C.   Review of Systems As per HPI  reporst b/l gluteal pain near SI joints x 2 years. No trauma.     Objective:   Physical Exam BP 133/80  Pulse 85  Temp(Src) 98.6 F (37 C) (Oral)  Wt 196 lb (88.905 kg) General appearance: alert, cooperative and no distress Lungs: clear to auscultation bilaterally Heart: regular rate and rhythm, S1, S2 normal, no murmur, click, rub or gallop Extremities: extremities normal, atraumatic, no cyanosis or edema except for trace edema at ankles.   Lab Results  Component Value Date   HGBA1C 7.3 06/07/2013       Assessment & Plan:

## 2013-06-07 NOTE — Assessment & Plan Note (Signed)
Not ready to quit.  Smoking cessation instruction/counseling given:  counseled patient on the dangers of tobacco use, advised patient to stop smoking.

## 2013-06-07 NOTE — Assessment & Plan Note (Addendum)
A: very well controlled with A1c near goal of < 7 P: Continue current regimen  Including statin, check LFTs

## 2013-06-07 NOTE — Assessment & Plan Note (Signed)
Resolved

## 2013-06-07 NOTE — Patient Instructions (Signed)
Mrs. Yearwood,  Thank you for coming in today. Excellent A1c. Please f/u in 3 months with new MD.  I have refilled all medication.  Dr. Adrian Blackwater   Info regarding low blood sugar management.  Beware of hypoglycemia which is blood sugar less than 70 with or without symptoms.  The common symptoms of hypoglycemia are: sweating, pale or dusty skin, excessive fatigue, nausea, jitteriness. If you experience these symptoms please check your blood sugar.  My blood sugar is low  (less than 70). What should I do?  If low 60- 70, with or without symptoms. Do not take insulin or oral medication,  eat or drink carbohydrates right away (juice, sweets, breads, fruit). Recheck blood sugar in 2 hrs. If still low call your doctor. If normal take medication.   If 60-40 without symptoms.  Same as above and call your doctor.   If 60-40 with symptoms. Same as above. If symptoms resolve within 30 minutes of eating or drinking carbohydrates call your doctor. If symptoms persist call 911.   If less than 40 with or without symptoms. Same as above. Do not wait 30 minutes, instead call 911.

## 2013-06-07 NOTE — Assessment & Plan Note (Signed)
HIV and Hep C screen

## 2013-06-08 LAB — HIV ANTIBODY (ROUTINE TESTING W REFLEX): HIV 1&2 Ab, 4th Generation: NONREACTIVE

## 2013-06-09 ENCOUNTER — Ambulatory Visit: Payer: Medicare Other | Admitting: Family Medicine

## 2013-06-09 ENCOUNTER — Telehealth: Payer: Self-pay | Admitting: *Deleted

## 2013-06-09 ENCOUNTER — Encounter: Payer: Self-pay | Admitting: *Deleted

## 2013-06-09 NOTE — Telephone Encounter (Signed)
Normal labs so letter was mailed to patient. Jazmin Hartsell,CMA

## 2013-06-09 NOTE — Telephone Encounter (Signed)
Message copied by Valerie Roys on Fri Jun 09, 2013  9:43 AM ------      Message from: Boykin Nearing      Created: Fri Jun 09, 2013  9:39 AM       Please inform patient.       Normal LFTs.      Negative HIV and Hep C. ------

## 2013-06-13 ENCOUNTER — Ambulatory Visit
Admission: RE | Admit: 2013-06-13 | Discharge: 2013-06-13 | Disposition: A | Discharge status: Home / Self Care | Payer: Medicare Other | Source: Ambulatory Visit | Attending: Family Medicine | Admitting: Family Medicine

## 2013-06-13 DIAGNOSIS — I10 Essential (primary) hypertension: Secondary | ICD-10-CM

## 2013-06-22 ENCOUNTER — Encounter: Payer: Self-pay | Admitting: Home Health Services

## 2013-06-22 ENCOUNTER — Ambulatory Visit (INDEPENDENT_AMBULATORY_CARE_PROVIDER_SITE_OTHER): Payer: Medicare Other | Admitting: Home Health Services

## 2013-06-22 VITALS — BP 120/78 | HR 76 | Temp 98.5°F | Ht 66.5 in | Wt 196.0 lb

## 2013-06-22 DIAGNOSIS — Z72821 Inadequate sleep hygiene: Secondary | ICD-10-CM

## 2013-06-22 DIAGNOSIS — F518 Other sleep disorders not due to a substance or known physiological condition: Secondary | ICD-10-CM

## 2013-06-22 DIAGNOSIS — Z0000 Encounter for general adult medical examination without abnormal findings: Secondary | ICD-10-CM

## 2013-06-22 NOTE — Progress Notes (Signed)
Patient here for annual wellness visit, patient reports: Risk Factors/Conditions needing evaluation or treatment: Pt. 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  Home Safety: Pt lives at home, with her husband in a 2 story home.  Pt reports having smoke alarms and denies having adaptive equipment.  Other Information: Corrective lens: Pt has corrective lens, has annual eye exams. Dentures: Pt has full dentures, denies having annual dental exams. Memory: Pt denies memory problems.  Patient's Mini Mental Score (recorded in doc. flowsheet): 30 Bladder:  Pt denies problems with bladder control.  ADL/IADL:  Pt reports independence in all functions. Balance/Gait: Pt reports 1 fall in the past year.  We discussed home safety and fall prevention.    Balance Abnormal Patient value  Sitting balance    Sit to stand    Attempts to arise    Immediate standing balance    Standing balance    Nudge    Eyes closed- Romberg    Tandem stance    Back lean    Neck Rotation X Pt reports having a pinched nerve in neck. Going to chiropractor.   360 degree turn    Sitting down      Pt. denied hearing exam at this time.    Annual Wellness Visit Requirements Recorded Today In  Medical, family, social history Past Medical, Family, Social History Section  Current providers Care team  Current medications Medications  Wt, BP, Ht, BMI Vital signs        Tobacco, alcohol, illicit drug use History  ADL Nurse Assessment  Depression Screening Nurse Assessment  Cognitive impairment Nurse Assessment  Mini Mental Status Document Flowsheet  Fall Risk Fall/Depression  Home Safety Progress Note  End of Life Planning (welcome visit) Social Documentation  Medicare preventative services Progress Note  Risk factors/conditions needing evaluation/treatment Progress Note  Personalized health advice Patient Instructions, goals, letter  Diet & Exercise Social  Documentation  Emergency Contact Social Documentation  Seat Belts Social Documentation  Sun exposure/protection Social Documentation

## 2013-06-22 NOTE — Progress Notes (Signed)
I have reviewed and agree with [facility] Cover's documentation.  [place]

## 2013-07-04 ENCOUNTER — Encounter: Payer: Self-pay | Admitting: Home Health Services

## 2013-07-04 NOTE — Progress Notes (Signed)
Patient ID: [name], female   DOB: 05-28-1950, 63 y.o.   MRN: [id number] have reviewed this visit and discussed with Lamont Dowdy and agree with her documentation.

## 2013-07-04 NOTE — Assessment & Plan Note (Signed)
A: trouble falling asleep causing worsening pain  P:  Addressed sleep hygiene including smoking, TV watching/reading in bedroom and lack of exercise  No sedatives F/u in 2 months

## 2013-07-13 ENCOUNTER — Other Ambulatory Visit: Payer: Self-pay | Admitting: Family Medicine

## 2013-08-23 ENCOUNTER — Encounter: Payer: Self-pay | Admitting: Family Medicine

## 2013-08-24 ENCOUNTER — Encounter (HOSPITAL_COMMUNITY): Payer: Self-pay | Admitting: Emergency Medicine

## 2013-08-24 ENCOUNTER — Emergency Department (INDEPENDENT_AMBULATORY_CARE_PROVIDER_SITE_OTHER)
Admission: EM | Admit: 2013-08-24 | Discharge: 2013-08-24 | Disposition: A | Discharge status: Home / Self Care | Payer: Medicare Other | Source: Home / Self Care | Attending: Emergency Medicine | Admitting: Emergency Medicine

## 2013-08-24 ENCOUNTER — Telehealth: Payer: Self-pay | Admitting: Family Medicine

## 2013-08-24 ENCOUNTER — Emergency Department (INDEPENDENT_AMBULATORY_CARE_PROVIDER_SITE_OTHER): Payer: Medicare Other

## 2013-08-24 DIAGNOSIS — J209 Acute bronchitis, unspecified: Secondary | ICD-10-CM

## 2013-08-24 MED ORDER — IBUPROFEN 800 MG PO TABS
ORAL_TABLET | ORAL | Status: AC
  Filled 2013-08-24: qty 1

## 2013-08-24 MED ORDER — LORAZEPAM 0.5 MG PO TABS: 0.5000 mg | ORAL_TABLET | Freq: Two times a day (BID) | ORAL | Status: DC | PRN

## 2013-08-24 MED ORDER — PREDNISONE 50 MG PO TABS: ORAL_TABLET | ORAL | Status: DC

## 2013-08-24 MED ORDER — DOXYCYCLINE HYCLATE 100 MG PO CAPS: 100.0000 mg | ORAL_CAPSULE | Freq: Two times a day (BID) | ORAL | Status: DC

## 2013-08-24 MED ORDER — IBUPROFEN 600 MG PO TABS: 600.0000 mg | ORAL_TABLET | Freq: Once | ORAL | Status: DC

## 2013-08-24 MED ORDER — IBUPROFEN 800 MG PO TABS
800.0000 mg | ORAL_TABLET | Freq: Once | ORAL | Status: AC
  Administered 2013-08-24: 800 mg via ORAL

## 2013-08-24 NOTE — ED Provider Notes (Signed)
CSN: [id number]     Arrival date & time 08/24/13  0802 History   First MD Initiated Contact with Patient 08/24/13 [phone]     Chief Complaint  Patient presents with  . URI   (Consider location/radiation/quality/duration/timing/severity/associated sxs/prior Treatment) HPI She is a 63 year old woman here today for evaluation of cough. She states her symptoms started 2 days ago. She is experiencing a cough. This is associated with chest discomfort underneath her left breast. The chest discomfort only with coughing and with movement. No association with exertion. She also reports sinus pressure, headaches, body aches, rhinorrhea, bilateral earaches. She reports a decreased appetite. No nausea or vomiting. No abdominal pain or diarrhea. She denies fevers at home, but does report some chills. Denies any known sick contacts.  Past Medical History  Diagnosis Date  . Anxiety   . Cataract   . DM2 (diabetes mellitus, type 2)   . Hyperlipidemia   . Hypertension     essential, benign   . Skin lesion   . Insomnia   . Weakness   . Screening for malignant neoplasm of the cervix   . Tobacco abuse   . Palpitations     hx  . Routine general medical examination at a health care facility   . Dyslipidemia   . Disturbance of skin sensation     facial paresthesia; left  . Menopausal syndrome   . GERD (gastroesophageal reflux disease)   . AR (allergic rhinitis)   . Colon polyps     hyperplastic  . Hemorrhoids   . SVT (supraventricular tachycardia)   . Allergy    Past Surgical History  Procedure Laterality Date  . Abdominal hysterectomy  1990    For fibroids benign.  Still has ovaries  . Appendectomy    . Dental extractions    . Sigmoidoscopy  2004  . Colonoscopy  2009  . Ablation for svt  9/09    Dr. Lovena Le   Family History  Problem Relation Age of Onset  . Coronary artery disease      female, 1st degree relative <50  . Heart attack Father 87  . Hypertension Mother   . Stroke Mother   .  Diabetes Mother   . Stomach cancer Mother   . Cancer Mother   . Stomach cancer Maternal Aunt    History  Substance Use Topics  . Smoking status: Current Every Day Smoker -- 0.50 packs/day    Types: Cigarettes  . Smokeless tobacco: Never Used  . Alcohol Use: 7.0 oz/week    14 drink(s) per week   OB History   Grav Para Term Preterm Abortions TAB SAB Ect Mult Living                 Review of Systems  Constitutional: Positive for chills and appetite change. Negative for fatigue.  HENT: Positive for ear pain, rhinorrhea, sinus pressure and sore throat.   Eyes: Positive for discharge (clear).  Respiratory: Positive for cough and wheezing. Negative for shortness of breath.   Cardiovascular: Positive for chest pain (with cough). Negative for palpitations.  Gastrointestinal: Negative.   Musculoskeletal: Positive for myalgias.  Skin: Negative.   Neurological: Positive for headaches. Negative for dizziness.    Allergies  Alcohol swabs; Amoxicillin-pot clavulanate; and Penicillins  Home Medications   Prior to Admission medications   Medication Sig Start Date End Date Taking? Authorizing Provider  aspirin 81 MG chewable tablet Chew 1 tablet (81 mg total) by mouth daily. 06/07/13   Josalyn C  Funches, MD  atenolol (TENORMIN) 25 MG tablet Take 1 tablet (25 mg total) by mouth daily. 06/07/13 06/07/14  Lennox Laity Funches, MD  atorvastatin (LIPITOR) 40 MG tablet Take 1 tablet (40 mg total) by mouth daily. 06/07/13   Minerva Ends, MD  Bepotastine Besilate (BEPREVE) 1.5 % SOLN Place 1 drop into both eyes 2 (two) times daily as needed. Dr. Harold Hedge, Allergy and Immunology    Historical Provider, MD  doxycycline (VIBRAMYCIN) 100 MG capsule Take 1 capsule (100 mg total) by mouth 2 (two) times daily. 08/24/13   Melony Overly, MD  gabapentin (NEURONTIN) 300 MG capsule Take 2-3 capsules (600-900 mg total) by mouth 2 (two) times daily as needed. 06/07/13   Josalyn C Funches, MD  glipiZIDE (GLUCOTROL) 5  MG tablet Take 1 tablet (5 mg total) by mouth daily. 06/07/13   Josalyn C Funches, MD  glucose blood (ONE TOUCH ULTRA TEST) test strip ICD-9 code 250.60 06/07/13   Josalyn C Funches, MD  hydrochlorothiazide (MICROZIDE) 12.5 MG capsule Take 1 capsule (12.5 mg total) by mouth daily. 06/07/13   Minerva Ends, MD  Lancets (ONETOUCH ULTRASOFT) lancets ICD-9 CODE 250.60 07/13/13   Nobie Putnam, DO  levalbuterol [facility] HFA) 45 MCG/ACT inhaler Inhale 2 puffs into the lungs every 4 (four) hours as needed for wheezing or shortness of breath. Dr. Harold Hedge, Allergy and Immunology    Historical Provider, MD  LORazepam (ATIVAN) 0.5 MG tablet Take 1 tablet (0.5 mg total) by mouth 2 (two) times daily as needed for anxiety. 06/07/13   Josalyn C Funches, MD  losartan (COZAAR) 50 MG tablet Take 1 tablet (50 mg total) by mouth daily. 06/07/13   Minerva Ends, MD  meclizine (ANTIVERT) 12.5 MG tablet Take 1 tablet (12.5 mg total) by mouth 3 (three) times daily as needed. if having symptoms.  May repeat in 12 hours later. 06/07/13   Minerva Ends, MD  metFORMIN (GLUCOPHAGE) 1000 MG tablet Take 1 tablet (1,000 mg total) by mouth 2 (two) times daily with a meal. 06/07/13   Josalyn C Funches, MD  mometasone (NASONEX) 50 MCG/ACT nasal spray Place 2 sprays into the nose daily. 06/07/13   Minerva Ends, MD  Olopatadine HCl (PATANASE) 0.6 % SOLN 1 drop (1 puff total) by Other route every morning. 06/07/13   Josalyn C Funches, MD  pantoprazole (PROTONIX) 40 MG tablet Take 1 tablet (40 mg total) by mouth daily. 06/07/13   Minerva Ends, MD  predniSONE (DELTASONE) 50 MG tablet Take 1 pill daily x5 days 08/24/13   Melony Overly, MD  sucralfate (CARAFATE) 1 GM/10ML suspension Take 10 mLs (1 g total) by mouth 4 (four) times daily as needed (reflux). 03/06/13   Josalyn C Funches, MD  traMADol (ULTRAM) 50 MG tablet Take 1 tablet (50 mg total) by mouth every 8 (eight) hours as needed. 06/07/13   Josalyn C Funches, MD   triamcinolone cream (KENALOG) 0.1 % Apply topically 2 (two) times daily. 06/07/13 06/07/14  Josalyn C Funches, MD   BP 157/92  Pulse 96  Temp(Src) 101.4 F (38.6 C) (Oral)  Resp 22  SpO2 94% Physical Exam  Constitutional: She is oriented to person, place, and time. She appears well-developed and well-nourished. No distress.  HENT:  Head: Normocephalic and atraumatic.  Right Ear: Tympanic membrane and external ear normal.  Left Ear: Tympanic membrane and external ear normal.  Nose: Rhinorrhea present. No mucosal edema.  Mouth/Throat: Oropharynx is clear and moist and  mucous membranes are normal. No oropharyngeal exudate or posterior oropharyngeal erythema.  Eyes: Conjunctivae are normal. Right eye exhibits no discharge. Left eye exhibits no discharge.  Neck: Normal range of motion. Neck supple.  Cardiovascular: Normal rate, regular rhythm and normal heart sounds.  Exam reveals no gallop.   No murmur heard. Pulmonary/Chest: Effort normal. No respiratory distress. She has wheezes (left lung base). She has no rales.  Lymphadenopathy:    She has no cervical adenopathy.  Neurological: She is alert and oriented to person, place, and time.  Skin: Skin is warm and dry. No rash noted.    ED Course  Procedures (including critical care time) Labs Review Labs Reviewed - No data to display  Imaging Review Dg Chest 2 View  08/24/2013   CLINICAL DATA:  Cough for 2 days. Head and chest congestion. Fever.  EXAM: CHEST  2 VIEW  COMPARISON:  PA and lateral chest 12/31/2012.  FINDINGS: Lungs are clear. Heart size is normal. No pneumothorax or pleural effusion. There is some peribronchial thickening. Thoracic spondylosis is noted.  IMPRESSION: Findings suggestive of bronchitis. Negative for focal airspace disease.   Electronically Signed   By: Inge Rise M.D.   On: 08/24/2013 09:07     MDM   1. Acute bronchitis, unspecified organism    Chest x-rays negative for pneumonia. O2 sat is decent  at 94%. Given that she is an active smoker and she is febrile, will treat for acute bronchitis with doxycycline and and prednisone burst. Continue over-the-counter cough syrup as needed. Can take ibuprofen as needed for fevers. Reviewed warning signs as in after visit summary. Followup with PCP if not improving by Monday.    Melony Overly, MD 08/24/13 0930

## 2013-08-24 NOTE — Discharge Instructions (Signed)
You have acute bronchitis. Take prednisone 1 pill daily for 5 days. Take doxycycline 1 pill twice a day for 10 days. Continue to use the cough syrup as needed.  You should start to see improvement in the next 2 days. If you are getting worse, having trouble breathing, or start vomiting, please go to the emergency room.

## 2013-08-24 NOTE — Telephone Encounter (Signed)
Needs refill on lorazepam [facility] on [address]

## 2013-08-24 NOTE — Telephone Encounter (Signed)
Reviewed chart. Scheduled to see patient for routine OV on 09/11/13. Phoned in refill Ativan 0.5mg  take BID PRN anxiety (#30, 2 refills). Pharmacy to notify patient of refill.  Nobie Putnam, [place], PGY-2

## 2013-08-24 NOTE — ED Notes (Signed)
Pt  Reports  Symptoms  Of cough  /  Congested   -     Body  Aches        With  Symptoms  For sev  Days         Pt     Reports  Pain  Under the  Breast  From repeated  Coughing          Pt  Ambulated to  Room  With a  Steady  Fluid  Gait

## 2013-08-28 ENCOUNTER — Other Ambulatory Visit: Payer: Self-pay | Admitting: *Deleted

## 2013-08-28 MED ORDER — METFORMIN HCL 1000 MG PO TABS: 1000.0000 mg | ORAL_TABLET | Freq: Two times a day (BID) | ORAL | Status: DC

## 2013-09-05 ENCOUNTER — Other Ambulatory Visit: Payer: Self-pay | Admitting: *Deleted

## 2013-09-05 MED ORDER — METFORMIN HCL 1000 MG PO TABS: 1000.0000 mg | ORAL_TABLET | Freq: Two times a day (BID) | ORAL | Status: DC

## 2013-09-11 ENCOUNTER — Ambulatory Visit (INDEPENDENT_AMBULATORY_CARE_PROVIDER_SITE_OTHER): Payer: Medicare Other | Admitting: Family Medicine

## 2013-09-11 ENCOUNTER — Encounter: Payer: Self-pay | Admitting: Family Medicine

## 2013-09-11 ENCOUNTER — Other Ambulatory Visit (HOSPITAL_COMMUNITY)
Admission: RE | Admit: 2013-09-11 | Discharge: 2013-09-11 | Disposition: A | Discharge status: Home / Self Care | Payer: Medicare Other | Source: Ambulatory Visit | Attending: Family Medicine | Admitting: Family Medicine

## 2013-09-11 VITALS — BP 136/91 | HR 70 | Temp 98.3°F | Ht 66.0 in | Wt 192.2 lb

## 2013-09-11 DIAGNOSIS — Z1151 Encounter for screening for human papillomavirus (HPV): Secondary | ICD-10-CM | POA: Insufficient documentation

## 2013-09-11 DIAGNOSIS — Z124 Encounter for screening for malignant neoplasm of cervix: Secondary | ICD-10-CM | POA: Insufficient documentation

## 2013-09-11 DIAGNOSIS — I10 Essential (primary) hypertension: Secondary | ICD-10-CM

## 2013-09-11 DIAGNOSIS — F17200 Nicotine dependence, unspecified, uncomplicated: Secondary | ICD-10-CM

## 2013-09-11 DIAGNOSIS — F518 Other sleep disorders not due to a substance or known physiological condition: Secondary | ICD-10-CM

## 2013-09-11 DIAGNOSIS — E1140 Type 2 diabetes mellitus with diabetic neuropathy, unspecified: Secondary | ICD-10-CM

## 2013-09-11 DIAGNOSIS — R002 Palpitations: Secondary | ICD-10-CM

## 2013-09-11 DIAGNOSIS — Z72821 Inadequate sleep hygiene: Secondary | ICD-10-CM

## 2013-09-11 DIAGNOSIS — E1142 Type 2 diabetes mellitus with diabetic polyneuropathy: Secondary | ICD-10-CM

## 2013-09-11 DIAGNOSIS — E1149 Type 2 diabetes mellitus with other diabetic neurological complication: Secondary | ICD-10-CM

## 2013-09-11 LAB — POCT GLYCOSYLATED HEMOGLOBIN (HGB A1C): Hemoglobin A1C: 7.6

## 2013-09-11 MED ORDER — GLIPIZIDE 5 MG PO TABS: 5.0000 mg | ORAL_TABLET | Freq: Every day | ORAL | Status: DC

## 2013-09-11 MED ORDER — PANTOPRAZOLE SODIUM 40 MG PO TBEC: 40.0000 mg | DELAYED_RELEASE_TABLET | Freq: Every day | ORAL | Status: DC

## 2013-09-11 MED ORDER — ATORVASTATIN CALCIUM 40 MG PO TABS: 40.0000 mg | ORAL_TABLET | Freq: Every day | ORAL | Status: DC

## 2013-09-11 MED ORDER — METFORMIN HCL 1000 MG PO TABS: 1000.0000 mg | ORAL_TABLET | Freq: Two times a day (BID) | ORAL | Status: DC

## 2013-09-11 MED ORDER — ONETOUCH ULTRASOFT LANCETS MISC: Status: DC

## 2013-09-11 MED ORDER — GLUCOSE BLOOD VI STRP: ORAL_STRIP | Status: DC

## 2013-09-11 MED ORDER — ATENOLOL 25 MG PO TABS: 25.0000 mg | ORAL_TABLET | Freq: Every day | ORAL | Status: DC

## 2013-09-11 MED ORDER — LOSARTAN POTASSIUM 50 MG PO TABS: 50.0000 mg | ORAL_TABLET | Freq: Every day | ORAL | Status: DC

## 2013-09-11 MED ORDER — TRAMADOL HCL 50 MG PO TABS: 50.0000 mg | ORAL_TABLET | Freq: Three times a day (TID) | ORAL | Status: DC | PRN

## 2013-09-11 MED ORDER — LORAZEPAM 0.5 MG PO TABS: 0.5000 mg | ORAL_TABLET | Freq: Two times a day (BID) | ORAL | Status: DC | PRN

## 2013-09-11 MED ORDER — GABAPENTIN 300 MG PO CAPS: 600.0000 mg | ORAL_CAPSULE | Freq: Two times a day (BID) | ORAL | Status: DC | PRN

## 2013-09-11 MED ORDER — HYDROCHLOROTHIAZIDE 12.5 MG PO CAPS: 12.5000 mg | ORAL_CAPSULE | Freq: Every day | ORAL | Status: DC

## 2013-09-11 NOTE — Assessment & Plan Note (Signed)
Seems to be related to poor sleep hygiene, also recent stress with husband sick No significant red flags for insomnia, not related to pain - Not adhering to previous sleep hygiene plans  Plan: 1. Provided education on recommend sleep hygiene techniques to work on. Specifically, start regular exercise, reduce smoking, no TV/Computer 30 min before bed, stop reading in bed 15 min before going to bed, reduce evening fluids. 2. May take some OTC sleep aid as PRN, advised to limit use 3. RTC 3 months

## 2013-09-11 NOTE — Assessment & Plan Note (Signed)
Well-controlled HTN No complications   Plan:  1. Continue current BP meds.  2. Due for BMET in 12/2013 3. Lifestyle Mods - walking, set goal 3x weekly

## 2013-09-11 NOTE — Progress Notes (Addendum)
Subjective:     Patient ID: [name], female   DOB: [birth year], 63 y.o.   MRN: [id number]  HPI  CHRONIC DM, Type 2: Reports no concerns CBGs: Avg 120, Low 84 (asymptomatic), High 175. Checks CBGs 3x daily Meds: Metformin 1000mg  BID, Glipizide daily in AM Reports good compliance. Tolerating well w/o side-effects Currently on ARB Lifestyle: Diet (regular, dec carbs, eats inc fruits) / Exercise (minimal, would like to start walking) Denies hypoglycemia, polyuria, visual changes, numbness or tingling.  Sleeping Difficulties: - Reports problems sleeping over 6 to 9 months, previously had similar problem, then improved, now sleeping difficulties are present again. Complains of difficulty falling asleep and when wakes up has difficulty falling back asleep - States she is often laying in bed thinking and can't fall back asleep - Working on improving sleep hygiene, now turns TV off sometimes, still reading before bed - Currently not taking any medicines. Asking if Zzz-quil is effective - Denies any significant pain keep her awake.  CHRONIC HTN: Reports no concerns Current Meds - Atenolol 25mg  daily, HCTZ 12.5mg  daily, Losartan 50mg  daily Reports good compliance, took meds today. Tolerating well, w/o complaints. Denies CP, dyspnea, HA, edema, dizziness / lightheadedness  Tobacco Abuse: - Currently active smoker 0.5 to 1 ppd - Eventually cut back, "don't have same taste" - Last tried to quit for 9 months when pregnant years ago - States she is not ready to quit yet, and needs to be the one to make the change  HM: - Due for Pap Smear. Last Pap 09/01/10 (Negative). S/p partial hysterectomy  I have reviewed and updated the following as appropriate: allergies and current medications  Social Hx: - Active smoker 0.5 ppd  Review of Systems  See above HPI    Objective:   Physical Exam  BP 136/91  Pulse 70  Temp(Src) 98.3 F (36.8 C) (Oral)  Ht 5\' 6"  (1.676 m)  Wt 192 lb 3.2 oz  (87.181 kg)  BMI 31.04 kg/m2  Gen - well-appearing, pleasant, NAD Neck - supple Heart - RRR, no murmurs heard Lungs - CTAB, no wheezing, crackles, or rhonchi. Normal work of breathing. Ext - non-tender, no edema, peripheral pulses intact +2 b/l Skin - warm, dry, no rashes Neuro - awake, alert, grossly non-focal  Pelvic Exam: Normal external female genitalia. Vaginal canal without lesions. Normal appearing cervix, without lesions or bleeding. Physiologic discharge on exam. Bimanual exam without masses or cervical motion tenderness. - Pap smear collected     Assessment:     See specific A&P problem list for details.      Plan:     See specific A&P problem list for details.

## 2013-09-11 NOTE — Patient Instructions (Signed)
Dear [name], Thank you for coming in to clinic today. It was good to meet you!  Today we discussed your Diabetes, Sleep Hygiene, Pap Smear. 1. Your HgbA1c is 7.6, and overall looks good. Continue checking your sugars 3x daily as we discussed, before breakfast, 2 hours after lunch and dinner. Record your sugars and bring your log to our next visit. 2. For your Sleep, I recommend continuing to work on the Sleep Hygiene:   - Turn TV, Computer off at least 30 minutes before going to sleep   - May read before bed, but stop reading 15 to 30 min before laying down to sleep   - Avoid caffeine, teas, or large amounts of fluid after 6pm   - Recommend to start Walking exercise - this should help you sleep a night, exercise has been proven to be very effective   - Also if you cut back on smoking, reduced nicotine will help your sleep   - You may try over the counter sleep aids like Unisom or Z-quil as needed. Try not to use every night. We can discuss this more next time. 3. Pap Smear results will give these to you at next visit or call sooner if concern 4. Not due for labs at this time. 5. Refills printed  Some important numbers from today's visit: HgA1C - 7.6 BP - 136/91 Results -  Please schedule a follow-up appointment with me (Dr. Parks Ranger) in 3 months  If you have any other questions or concerns, please feel free to call the clinic to contact me. You may also schedule an earlier appointment if necessary.  However, if your symptoms get significantly worse, please go to the Emergency Department to seek immediate medical attention.  Nobie Putnam, [place]

## 2013-09-11 NOTE — Assessment & Plan Note (Signed)
Continues to smoke, recently cut down but not ready to quit - Not interested in additional smoking cessation assistance  Plan: 1. Smoking cessation, counseling, advised to cut-back / quit 2. Patient seems to be self-motivated to quit smoking, interested in quitting at future date

## 2013-09-11 NOTE — Assessment & Plan Note (Addendum)
Well-controlled. Current HgbA1c 7.6 (09/11/13), last HgbA1c 7.3 (06/07/13). No hypoglycemia. Peripheral neuropathy. Last DM eye exam 08/14/13 (normal, no retinopathy)  Plan:  1. Continue current therapy. Bring log to next visit 2. Lifestyle Mods - walking, set goal 3x weekly 3. Due for Direct LDL 12/2013  4. Refilled Gabapentin, Tramadol for peripheral neuropathy pain 5. RTC 3 months

## 2013-09-12 LAB — CYTOLOGY - PAP

## 2013-09-13 ENCOUNTER — Encounter: Payer: Self-pay | Admitting: Family Medicine

## 2013-11-05 ENCOUNTER — Other Ambulatory Visit: Payer: Self-pay | Admitting: Family Medicine

## 2013-12-01 ENCOUNTER — Ambulatory Visit (INDEPENDENT_AMBULATORY_CARE_PROVIDER_SITE_OTHER): Payer: Medicare Other | Admitting: Family Medicine

## 2013-12-01 ENCOUNTER — Encounter: Payer: Self-pay | Admitting: Family Medicine

## 2013-12-01 VITALS — BP 116/71 | HR 84 | Temp 98.3°F | Ht 66.0 in | Wt 191.7 lb

## 2013-12-01 DIAGNOSIS — G629 Polyneuropathy, unspecified: Secondary | ICD-10-CM | POA: Insufficient documentation

## 2013-12-01 DIAGNOSIS — F411 Generalized anxiety disorder: Secondary | ICD-10-CM

## 2013-12-01 DIAGNOSIS — R002 Palpitations: Secondary | ICD-10-CM

## 2013-12-01 DIAGNOSIS — Z72821 Inadequate sleep hygiene: Secondary | ICD-10-CM

## 2013-12-01 DIAGNOSIS — E1140 Type 2 diabetes mellitus with diabetic neuropathy, unspecified: Secondary | ICD-10-CM

## 2013-12-01 DIAGNOSIS — Z720 Tobacco use: Secondary | ICD-10-CM

## 2013-12-01 DIAGNOSIS — K219 Gastro-esophageal reflux disease without esophagitis: Secondary | ICD-10-CM

## 2013-12-01 DIAGNOSIS — I10 Essential (primary) hypertension: Secondary | ICD-10-CM

## 2013-12-01 DIAGNOSIS — Z124 Encounter for screening for malignant neoplasm of cervix: Secondary | ICD-10-CM | POA: Insufficient documentation

## 2013-12-01 DIAGNOSIS — F17200 Nicotine dependence, unspecified, uncomplicated: Secondary | ICD-10-CM

## 2013-12-01 LAB — POCT GLYCOSYLATED HEMOGLOBIN (HGB A1C): HEMOGLOBIN A1C: 8.1

## 2013-12-01 MED ORDER — LOSARTAN POTASSIUM 50 MG PO TABS: 50.0000 mg | ORAL_TABLET | Freq: Every day | ORAL | Status: DC

## 2013-12-01 MED ORDER — ONETOUCH ULTRASOFT LANCETS MISC: Status: DC

## 2013-12-01 MED ORDER — PANTOPRAZOLE SODIUM 40 MG PO TBEC: 40.0000 mg | DELAYED_RELEASE_TABLET | Freq: Every day | ORAL | Status: DC

## 2013-12-01 MED ORDER — METFORMIN HCL 1000 MG PO TABS: 1000.0000 mg | ORAL_TABLET | Freq: Two times a day (BID) | ORAL | Status: DC

## 2013-12-01 MED ORDER — SUCRALFATE 1 GM/10ML PO SUSP: 1.0000 g | Freq: Four times a day (QID) | ORAL | Status: DC | PRN

## 2013-12-01 MED ORDER — ATENOLOL 25 MG PO TABS: 25.0000 mg | ORAL_TABLET | Freq: Every day | ORAL | Status: DC

## 2013-12-01 MED ORDER — HYDROCHLOROTHIAZIDE 12.5 MG PO CAPS: 12.5000 mg | ORAL_CAPSULE | Freq: Every day | ORAL | Status: DC

## 2013-12-01 MED ORDER — MECLIZINE HCL 25 MG PO TABS: 25.0000 mg | ORAL_TABLET | Freq: Two times a day (BID) | ORAL | Status: DC | PRN

## 2013-12-01 MED ORDER — TRAMADOL HCL 50 MG PO TABS: 50.0000 mg | ORAL_TABLET | Freq: Three times a day (TID) | ORAL | Status: DC | PRN

## 2013-12-01 MED ORDER — ATORVASTATIN CALCIUM 40 MG PO TABS: 40.0000 mg | ORAL_TABLET | Freq: Every day | ORAL | Status: DC

## 2013-12-01 MED ORDER — LORAZEPAM 0.5 MG PO TABS: 0.5000 mg | ORAL_TABLET | Freq: Two times a day (BID) | ORAL | Status: DC | PRN

## 2013-12-01 MED ORDER — GLUCOSE BLOOD VI STRP: ORAL_STRIP | Status: DC

## 2013-12-01 MED ORDER — GLIPIZIDE 5 MG PO TABS: 5.0000 mg | ORAL_TABLET | Freq: Every day | ORAL | Status: DC

## 2013-12-01 MED ORDER — GABAPENTIN 300 MG PO CAPS
600.0000 mg | ORAL_CAPSULE | Freq: Two times a day (BID) | ORAL | Status: DC | PRN
Start: 2013-12-01 — End: 2014-11-07

## 2013-12-01 NOTE — Assessment & Plan Note (Signed)
Controlled Refill Protonix daily, Carafate PRN

## 2013-12-01 NOTE — Progress Notes (Signed)
Subjective:     Patient ID: [name], female   DOB: 08-02-1950, 63 y.o.   MRN: [id number]  HPI  Dizziness: - Reports several episodes of dizziness recently, similar to previous episodes several years ago, which were self-limited without complications. Describes episode of dizziness following lunch on Monday when tried to get up, improved with rest, similar episode next 2 days, not as severe, different times while sitting down, no strenuous activity. Took some OTC Meclizine with resolved symptoms. No further episodes since. Previous hx using Meclizine for similar symptoms - Admits mild headache (resolved) - Denies any vertigo, chest pain / tightness, SOB, vision changes, lightheadedness, pre/syncope, fall  CHRONIC DM, Type 2: Reports no concerns CBGs: Avg 120, Low 97 (asymptomatic), High 164. Checks CBGs 3x daily Meds: Metformin 1000mg  BID, Glipizide daily in AM Reports good compliance. Tolerating well w/o side-effects Currently on ARB Diet (recently inc sodas, otherwise trying to inc vegetables) Exercise (started walking recently, every other day up to 30 min) Admits to pain in both feet and sometimes hands, improved on treatment for neuropathy Denies hypoglycemia, polyuria, visual changes, numbness or tingling.  Sleeping Difficulties: - Reports avg sleep history with the following: most days takes 1 hr nap during day, watching TV (while sitting in bed) and falls asleep then turns TV off when wakes up around 1am and "tries to sleep", falls asleep by 3am, wakes up around 6-8am. Some nocturia. However, recently on vacation reported slept well, stated no TV in bedroom. - Caffeine - decaf coffee, occasional sodas (more frequently) - Medications tried OTC ZQuil occasionally  CHRONIC HTN: Reports no concerns Current Meds - Atenolol 25mg  daily, HCTZ 12.5mg  daily, Losartan 50mg  daily Reports good compliance, took meds today. Tolerating well, w/o complaints. Denies CP, dyspnea, HA, edema,  dizziness / lightheadedness  I have reviewed and updated the following as appropriate: allergies and current medications  Social Hx: - Active smoker 0.5 ppd  Review of Systems  See above HPI    Objective:   Physical Exam  BP 116/71 mmHg  Pulse 84  Temp(Src) 98.3 F (36.8 C) (Oral)  Ht 5\' 6"  (1.676 m)  Wt 191 lb 11.2 oz (86.955 kg)  BMI 30.96 kg/m2  Gen - well-appearing, pleasant, NAD HEENT - NCAT, PERRL, EOMI, oropharynx clear, MMM Neck - supple Heart - RRR, no murmurs heard Lungs - CTAB, no wheezing, crackles, or rhonchi. Normal work of breathing. Ext - non-tender, non-pitting bilateral ankle edema, peripheral pulses intact +2 b/l Skin - warm, dry, no rashes. Left foot with area of skin change likely abrasion along medial great toe surface. Neuro - awake, alert, grossly non-focal, intact distal sensation to light touch, gait normal     Assessment:     See specific A&P problem list for details.      Plan:     See specific A&P problem list for details.

## 2013-12-01 NOTE — Assessment & Plan Note (Signed)
Controlled on Ativan, refilled x 3 months

## 2013-12-01 NOTE — Assessment & Plan Note (Signed)
Active smoker, not ready to quit Provided smoking cessation counseling

## 2013-12-01 NOTE — Assessment & Plan Note (Signed)
Consistent with poor sleep hygiene, did not make changes as discussed before - Not considered clinical insomnia, given resolved sleeping while on vacation  Plan: 1. Improve sleep hygiene - most important to watch TV / read in different room (not in bed), avoid screen time 30 min prior to bed, set regular bedtime nightly 2. May try Melatonin 0.1 to 0.3mg  qhs as tolerated

## 2013-12-01 NOTE — Assessment & Plan Note (Signed)
Well-controlled HTN No complications   Plan:  1. Continue current BP meds.  2. Due for BMET next visit 3. Continue walking regularly

## 2013-12-01 NOTE — Assessment & Plan Note (Signed)
See DM2, with complicated peripheral neuropathy Refilled Gabapentin, Tramadol

## 2013-12-01 NOTE — Patient Instructions (Signed)
Dear [name], Thank you for coming in to clinic today.  1. Your HgbA1c is 8.1 - overall increased from last time. Continue checking sugar regularly. Bring log to next visit - Continue to work on improved diet changes (reduce carbs, sugars, sodas) and continue regular walking exercise, may even try walking 5 x weekly  2. For your Sleep, I recommend continuing to work on the Sleep Hygiene:   - Watch TV or Read in a different room - NOT IN BED   - Turn TV, Computer off at least 30 minutes before going to sleep   - May read before bed, but stop reading 15 to 30 min before laying down to sleep   - Avoid caffeine, teas, or large amounts of fluid after 6pm   - Also if you cut back on smoking, reduced nicotine will help your sleep   - You may try Melatonin 0.1 to 0.3 mg supplement - (you can cut them in half if you get 0.3 and try half tab first)   3. Not due for labs yet - we will get these at your next visit in 3 months  Some important numbers from today's visit: BP - 116 / 71 Results -  Please schedule a follow-up appointment with Dr. Parks Ranger in 3 months for Diabetes  If you have any other questions or concerns, please feel free to call the clinic to contact me. You may also schedule an earlier appointment if necessary.  However, if your symptoms get significantly worse, please go to the Emergency Department to seek immediate medical attention.  Nobie Putnam, [place]

## 2013-12-01 NOTE — Assessment & Plan Note (Addendum)
Slight worsening control, increased A1c Current A1c 8.1 (12/01/13), last A1c 7.6 (3/73/57) Complicated by peripheral neuropathy No hypoglycemia.  Plan: 1. Continue current therapy - Metformin, Glipizide. Check 3x daily, bring CBG log to next visit 2. Continue lifestyle modes - inc walking exercise to 5x weekly 30 min 3. Due for Direct LDL at next visit 4. Printed refills x 3 months (refills up to 1 year) 5. RTC 3 months, A1c

## 2014-01-02 ENCOUNTER — Other Ambulatory Visit: Payer: Self-pay | Admitting: *Deleted

## 2014-01-02 DIAGNOSIS — E1140 Type 2 diabetes mellitus with diabetic neuropathy, unspecified: Secondary | ICD-10-CM

## 2014-01-02 MED ORDER — GLUCOSE BLOOD VI STRP: ORAL_STRIP | Status: DC

## 2014-01-08 ENCOUNTER — Telehealth: Payer: Self-pay | Admitting: Family Medicine

## 2014-01-08 DIAGNOSIS — I10 Essential (primary) hypertension: Secondary | ICD-10-CM

## 2014-01-08 DIAGNOSIS — Z8679 Personal history of other diseases of the circulatory system: Secondary | ICD-10-CM

## 2014-01-08 DIAGNOSIS — Z9889 Other specified postprocedural states: Secondary | ICD-10-CM

## 2014-01-08 NOTE — Telephone Encounter (Signed)
Pt has an upcoming appt with her cardiologist, Dr. Lovena Le, her insurance says they will need a referral in order for her to continue going there. Pt wants to know if we can take care of that so there isn't any problems when she goes to her appt in Feb.

## 2014-01-09 DIAGNOSIS — Z8679 Personal history of other diseases of the circulatory system: Secondary | ICD-10-CM | POA: Insufficient documentation

## 2014-01-09 DIAGNOSIS — Z9889 Other specified postprocedural states: Secondary | ICD-10-CM | POA: Insufficient documentation

## 2014-01-09 NOTE — Telephone Encounter (Signed)
Chart reviewed, ambulatory referral to Cardiology placed. To Dr. Lovena Le - continue f/u for HTN and history of SVT s/p ablation.  Nobie Putnam, [place], PGY-2

## 2014-01-16 ENCOUNTER — Other Ambulatory Visit: Payer: Self-pay | Admitting: Family Medicine

## 2014-01-16 DIAGNOSIS — I10 Essential (primary) hypertension: Secondary | ICD-10-CM

## 2014-01-18 DIAGNOSIS — J3089 Other allergic rhinitis: Secondary | ICD-10-CM | POA: Diagnosis not present

## 2014-01-18 DIAGNOSIS — J301 Allergic rhinitis due to pollen: Secondary | ICD-10-CM | POA: Diagnosis not present

## 2014-01-24 DIAGNOSIS — J301 Allergic rhinitis due to pollen: Secondary | ICD-10-CM | POA: Diagnosis not present

## 2014-01-24 DIAGNOSIS — J3089 Other allergic rhinitis: Secondary | ICD-10-CM | POA: Diagnosis not present

## 2014-01-25 ENCOUNTER — Encounter: Payer: Self-pay | Admitting: Family Medicine

## 2014-01-31 DIAGNOSIS — J3089 Other allergic rhinitis: Secondary | ICD-10-CM | POA: Diagnosis not present

## 2014-01-31 DIAGNOSIS — J301 Allergic rhinitis due to pollen: Secondary | ICD-10-CM | POA: Diagnosis not present

## 2014-02-01 DIAGNOSIS — J301 Allergic rhinitis due to pollen: Secondary | ICD-10-CM | POA: Diagnosis not present

## 2014-02-01 DIAGNOSIS — J3089 Other allergic rhinitis: Secondary | ICD-10-CM | POA: Diagnosis not present

## 2014-02-06 ENCOUNTER — Other Ambulatory Visit: Payer: Self-pay | Admitting: *Deleted

## 2014-02-06 DIAGNOSIS — K219 Gastro-esophageal reflux disease without esophagitis: Secondary | ICD-10-CM

## 2014-02-06 MED ORDER — PANTOPRAZOLE SODIUM 40 MG PO TBEC: 40.0000 mg | DELAYED_RELEASE_TABLET | Freq: Every day | ORAL | Status: DC

## 2014-02-09 DIAGNOSIS — J3089 Other allergic rhinitis: Secondary | ICD-10-CM | POA: Diagnosis not present

## 2014-02-09 DIAGNOSIS — J301 Allergic rhinitis due to pollen: Secondary | ICD-10-CM | POA: Diagnosis not present

## 2014-02-16 DIAGNOSIS — J301 Allergic rhinitis due to pollen: Secondary | ICD-10-CM | POA: Diagnosis not present

## 2014-02-16 DIAGNOSIS — J3089 Other allergic rhinitis: Secondary | ICD-10-CM | POA: Diagnosis not present

## 2014-02-20 ENCOUNTER — Other Ambulatory Visit: Payer: Self-pay | Admitting: Family Medicine

## 2014-02-20 DIAGNOSIS — E1140 Type 2 diabetes mellitus with diabetic neuropathy, unspecified: Secondary | ICD-10-CM

## 2014-02-23 DIAGNOSIS — J3089 Other allergic rhinitis: Secondary | ICD-10-CM | POA: Diagnosis not present

## 2014-02-23 DIAGNOSIS — J301 Allergic rhinitis due to pollen: Secondary | ICD-10-CM | POA: Diagnosis not present

## 2014-02-27 ENCOUNTER — Ambulatory Visit: Payer: Medicare Other | Admitting: Internal Medicine

## 2014-02-27 ENCOUNTER — Other Ambulatory Visit: Payer: Self-pay | Admitting: *Deleted

## 2014-02-27 DIAGNOSIS — F411 Generalized anxiety disorder: Secondary | ICD-10-CM

## 2014-02-28 ENCOUNTER — Other Ambulatory Visit: Payer: Self-pay | Admitting: Family Medicine

## 2014-02-28 DIAGNOSIS — F411 Generalized anxiety disorder: Secondary | ICD-10-CM

## 2014-02-28 MED ORDER — LORAZEPAM 0.5 MG PO TABS: 0.5000 mg | ORAL_TABLET | Freq: Two times a day (BID) | ORAL | Status: DC | PRN

## 2014-03-08 ENCOUNTER — Ambulatory Visit (INDEPENDENT_AMBULATORY_CARE_PROVIDER_SITE_OTHER): Payer: Medicare Other | Admitting: Family Medicine

## 2014-03-08 ENCOUNTER — Encounter: Payer: Self-pay | Admitting: Family Medicine

## 2014-03-08 VITALS — BP 147/80 | HR 88 | Temp 98.5°F | Ht 66.0 in | Wt 190.0 lb

## 2014-03-08 DIAGNOSIS — E1140 Type 2 diabetes mellitus with diabetic neuropathy, unspecified: Secondary | ICD-10-CM | POA: Diagnosis not present

## 2014-03-08 DIAGNOSIS — Z72821 Inadequate sleep hygiene: Secondary | ICD-10-CM

## 2014-03-08 DIAGNOSIS — Z720 Tobacco use: Secondary | ICD-10-CM

## 2014-03-08 DIAGNOSIS — G629 Polyneuropathy, unspecified: Secondary | ICD-10-CM

## 2014-03-08 DIAGNOSIS — I10 Essential (primary) hypertension: Secondary | ICD-10-CM | POA: Diagnosis not present

## 2014-03-08 DIAGNOSIS — F17200 Nicotine dependence, unspecified, uncomplicated: Secondary | ICD-10-CM

## 2014-03-08 LAB — POCT GLYCOSYLATED HEMOGLOBIN (HGB A1C): HEMOGLOBIN A1C: 7.6

## 2014-03-08 NOTE — Patient Instructions (Signed)
Dear [name], Thank you for coming in to clinic today.  1. Your HgbA1c is 7.6 - improved since last time 8.1 - Keep up the great work. - Continue walking every day, and work on adding that second walk a few times a week - Great work with your weight loss and dietary changes  2. Let us know when you become ready to really discuss quitting smoking - this will help your blood pressure and your diabetes  3. Labs today - will mail you copy of results within 1-2 weeks. If any concerns will call sooner  4. You should have 1 year supply of all medications. Call us if you need anything sooner.  Some important numbers from today's visit: BP - 147/80 Results -  Please schedule a follow-up appointment with Dr. Parks Ranger in 3 months for Diabetes  If you have any other questions or concerns, please feel free to call the clinic to contact me. You may also schedule an earlier appointment if necessary.  However, if your symptoms get significantly worse, please go to the Emergency Department to seek immediate medical attention.  Nobie Putnam, [place]

## 2014-03-08 NOTE — Assessment & Plan Note (Signed)
See DM2, DM Foot exam today (normal w/o sensory loss to monofilament)  Plan: 1. Cont Gabapentin, Tramadol + Tylenol for neuropathy

## 2014-03-08 NOTE — Assessment & Plan Note (Signed)
Well-controlled HTN. Elevated BP today, remains close to goal 140/90. No complications   Plan:  1. Continue current BP meds.  2. BMET today 3. Continue walking regularly 4. Smoking cessation, pt remains active smoker

## 2014-03-08 NOTE — Assessment & Plan Note (Signed)
Active smoker, remains 0.5ppd Not ready to quit  Plan: 1. Smoking cessation counseling. Offered to refer to pharm clinic for Dr. Valentina Lucks if interested

## 2014-03-08 NOTE — Progress Notes (Signed)
Subjective:     Patient ID: [name], female   DOB: Feb 25, 1950, 64 y.o.   MRN: [id number]  HPI  CHRONIC DM, Type 2: Reports no concerns - pleased she lost 2 lbs since last visit. States she is getting up earlier to take husband to work, which disturbs her normal daily eating pattern. CBGs: Avg 120, Low 90 (asymptomatic), High 140. Checks CBGs 3x daily Meds: Metformin 1000mg  BID, Glipizide daily in AM Reports good compliance. Tolerating well w/o side-effects Currently on ARB Diet (drinks 1 soda weekly, otherwise trying to inc vegetables) Exercise (currently walking daily 20-48min, with goal to inc to BID) Admits bilateral peripheral neuropathy, controlled taking Tramadol + Tylenol ES x 2 about 3 times daily Denies hypoglycemia, polyuria, visual changes, numbness or tingling  CHRONIC HTN: Reports no concerns. Does not check BP at home. Current Meds - Atenolol 25mg  daily, HCTZ 12.5mg  daily, Losartan 50mg  daily Reports good compliance, took meds today. Tolerating well, w/o complaints. Denies CP, dyspnea, HA, edema, dizziness / lightheadedness  Sleeping Difficulties: - Improved since last visit. Trying to work on sleep hygiene. Sleeping with TV off more. - Reduced sodas, 1x weekly - Increased exercise - Tried OTC melatonin, some stomach upset. Occasional OTC ZQuill  I have reviewed and updated the following as appropriate: allergies and current medications  Social Hx: - Active smoker 0.5 ppd  Review of Systems  See above HPI    Objective:   Physical Exam  BP 147/80 mmHg  Pulse 88  Temp(Src) 98.5 F (36.9 C) (Oral)  Ht 5\' 6"  (1.676 m)  Wt 190 lb (86.183 kg)  BMI 30.68 kg/m2  Gen - well-appearing, NAD HEENT - oropharynx clear, MMM Heart - RRR, no murmurs heard Lungs - CTAB, no wheezing, crackles, or rhonchi. Non-labored Ext - non-tender, non-pitting bilateral ankle edema, peripheral pulses intact +2 b/l Skin - warm, dry, no rashes. Right foot dorsal lateral aspect  with resolving eczema dry skin patch Neuro - awake, alert, grossly non-focal, intact distal sensation to light touch  DM Foot Exam - see separate epic documentation     Assessment & Plan:     See specific A&P problem list for details.

## 2014-03-08 NOTE — Assessment & Plan Note (Signed)
Improved control Current A1c 7.6 (03/08/14), last A1c 8.1 (16/1096) Complicated by peripheral neuropathy No hypoglycemia. DM Foot exam today - no lesions, sensation intact to monofilament  Plan: 1. Continue current therapy - Metformin, Glipizide. Check 3x daily 2. Continue lifestyle modes - improved walking daily x 20-60min, goal to inc to BID. Reduced sodas to weekly 3. Direct LDL today, on statin 4. Cont Gabapentin TID for neuropathy 5. No refills today, has 1 year supply from last visit 6. RTC 3 months, A1c. If remains improved, consider space to 6 months

## 2014-03-08 NOTE — Assessment & Plan Note (Signed)
Improved Trial OTC sleep aid PRN, did not tolerate melatonin

## 2014-03-09 LAB — BASIC METABOLIC PANEL
BUN: 10 mg/dL (ref 6–23)
CHLORIDE: 102 meq/L (ref 96–112)
CO2: 26 mEq/L (ref 19–32)
CREATININE: 0.54 mg/dL (ref 0.50–1.10)
Calcium: 9.7 mg/dL (ref 8.4–10.5)
Glucose, Bld: 153 mg/dL — ABNORMAL HIGH (ref 70–99)
Potassium: 3.5 mEq/L (ref 3.5–5.3)
Sodium: 139 mEq/L (ref 135–145)

## 2014-03-09 LAB — LDL CHOLESTEROL, DIRECT: Direct LDL: 55 mg/dL

## 2014-03-13 DIAGNOSIS — J301 Allergic rhinitis due to pollen: Secondary | ICD-10-CM | POA: Diagnosis not present

## 2014-03-13 DIAGNOSIS — J3089 Other allergic rhinitis: Secondary | ICD-10-CM | POA: Diagnosis not present

## 2014-03-14 ENCOUNTER — Encounter: Payer: Self-pay | Admitting: Family Medicine

## 2014-03-16 DIAGNOSIS — J301 Allergic rhinitis due to pollen: Secondary | ICD-10-CM | POA: Diagnosis not present

## 2014-03-16 DIAGNOSIS — J3089 Other allergic rhinitis: Secondary | ICD-10-CM | POA: Diagnosis not present

## 2014-03-20 ENCOUNTER — Ambulatory Visit (INDEPENDENT_AMBULATORY_CARE_PROVIDER_SITE_OTHER): Payer: Medicare Other

## 2014-03-20 VITALS — BP 131/70 | HR 84 | Resp 12

## 2014-03-20 DIAGNOSIS — E1140 Type 2 diabetes mellitus with diabetic neuropathy, unspecified: Secondary | ICD-10-CM | POA: Diagnosis not present

## 2014-03-20 DIAGNOSIS — M79676 Pain in unspecified toe(s): Secondary | ICD-10-CM

## 2014-03-20 DIAGNOSIS — B351 Tinea unguium: Secondary | ICD-10-CM

## 2014-03-20 DIAGNOSIS — G629 Polyneuropathy, unspecified: Secondary | ICD-10-CM

## 2014-03-20 DIAGNOSIS — M2040 Other hammer toe(s) (acquired), unspecified foot: Secondary | ICD-10-CM

## 2014-03-20 NOTE — Progress Notes (Signed)
   Subjective:    Patient ID: [name], female    DOB: 12/21/1950, 64 y.o.   MRN: [id number]  HPI  TOENAILS TRIM.  Review of Systems  All other systems reviewed and are negative.      Objective:   Physical Exam No new findings or systemic changes pedal pulses palpable DP +2 PT 1 over 4 bilateral Refill time 4 seconds all digits epicritic sensations diminished on Semmes Weinstein to the forefoot digits and arch bilateral. Nails thick brittle crumbly friable criptotic incurvated 1 through 5 bilateral. Patient has been wearing diabetic shoes which or issued a year ago requesting new shoes will need to get authorization for new shoes and Plastizote diabetic inlays. Does have history of keratoses pinch calcium the hallux bilateral and first MTP area bilateral there is being managed well with shoes and custom inlays. No open wounds no ulcers no secondary infections at this time.       Assessment & Plan:  Assessment this time his diabetes with complications contractures multiple toes keratoses as an issue in the past at this time we'll obtain authorization for shoes debridement of nails painful symptomatically mycotic nails 1 through 5 bilateral debrided at this time the presence of diabetes and complication return for future palliative nail care in 3 months consider of diabetic shoe measurement fitting when authorization obtained within the next month  Harriet Masson DPM

## 2014-03-21 DIAGNOSIS — J3089 Other allergic rhinitis: Secondary | ICD-10-CM | POA: Diagnosis not present

## 2014-03-21 DIAGNOSIS — J301 Allergic rhinitis due to pollen: Secondary | ICD-10-CM | POA: Diagnosis not present

## 2014-03-23 DIAGNOSIS — J3089 Other allergic rhinitis: Secondary | ICD-10-CM | POA: Diagnosis not present

## 2014-03-23 DIAGNOSIS — J301 Allergic rhinitis due to pollen: Secondary | ICD-10-CM | POA: Diagnosis not present

## 2014-03-27 ENCOUNTER — Encounter: Payer: Self-pay | Admitting: Family Medicine

## 2014-03-27 ENCOUNTER — Ambulatory Visit: Payer: Self-pay | Admitting: Internal Medicine

## 2014-03-27 NOTE — Progress Notes (Signed)
Already received paperwork from Dr. Harriet Masson at [facility] (last see 03/20/14), reviewed, completed and signed paperwork yesterday 03/26/14, it was placed in to be faxed pile yesterday.  Could you please check to see if it had been received? The forms just required signature and date of PCP, listed date of last DM visit with me (03/08/14) and a copy of her last OV from 03/08/14 all included in fax.  UPDATE 1400 on 3/15- [place], spoke with Melody who processes DM shoe paperwork. She stated that it appears the fax may not have been received at this time, but she advised to continue to wait a full 24 hours. She provided reassurance that [place] will notify [facility] if any further paperwork is needed.  Nobie Putnam, [place], PGY-2

## 2014-03-27 NOTE — Progress Notes (Signed)
Patient left message on medical records line that she is needing papers for Diabetic shoes sent to [place].

## 2014-03-27 NOTE — Progress Notes (Signed)
Spoke with provider at [facility], patient was recently seen there, they will be faxing over form for provider to sign for authorization of diabetic shoes for patient.

## 2014-03-30 DIAGNOSIS — J301 Allergic rhinitis due to pollen: Secondary | ICD-10-CM | POA: Diagnosis not present

## 2014-03-30 DIAGNOSIS — J3089 Other allergic rhinitis: Secondary | ICD-10-CM | POA: Diagnosis not present

## 2014-04-04 ENCOUNTER — Ambulatory Visit: Payer: Medicare Other | Admitting: *Deleted

## 2014-04-04 DIAGNOSIS — E1140 Type 2 diabetes mellitus with diabetic neuropathy, unspecified: Secondary | ICD-10-CM

## 2014-04-05 DIAGNOSIS — J301 Allergic rhinitis due to pollen: Secondary | ICD-10-CM | POA: Diagnosis not present

## 2014-04-05 DIAGNOSIS — J3089 Other allergic rhinitis: Secondary | ICD-10-CM | POA: Diagnosis not present

## 2014-04-05 NOTE — Progress Notes (Signed)
Patient ID: [name], female   DOB: February 28, 1950, 64 y.o.   MRN: [id number] MEASURED AND SCANNED FOR DIABETIC SHOES  APEX CLASSIC OXFORD STRAP B3000W WOMEN'S 12 MEDIUM SAME SIZE AND STYLE AS SHE IS CURRENTLY WEARING

## 2014-04-12 DIAGNOSIS — J301 Allergic rhinitis due to pollen: Secondary | ICD-10-CM | POA: Diagnosis not present

## 2014-04-12 DIAGNOSIS — J3089 Other allergic rhinitis: Secondary | ICD-10-CM | POA: Diagnosis not present

## 2014-04-19 DIAGNOSIS — J3089 Other allergic rhinitis: Secondary | ICD-10-CM | POA: Diagnosis not present

## 2014-04-19 DIAGNOSIS — J301 Allergic rhinitis due to pollen: Secondary | ICD-10-CM | POA: Diagnosis not present

## 2014-04-27 ENCOUNTER — Ambulatory Visit (INDEPENDENT_AMBULATORY_CARE_PROVIDER_SITE_OTHER): Payer: Medicare Other | Admitting: Podiatry

## 2014-04-27 DIAGNOSIS — M2012 Hallux valgus (acquired), left foot: Secondary | ICD-10-CM

## 2014-04-27 DIAGNOSIS — E1140 Type 2 diabetes mellitus with diabetic neuropathy, unspecified: Secondary | ICD-10-CM

## 2014-04-27 DIAGNOSIS — M2042 Other hammer toe(s) (acquired), left foot: Secondary | ICD-10-CM

## 2014-04-27 DIAGNOSIS — M2011 Hallux valgus (acquired), right foot: Secondary | ICD-10-CM

## 2014-04-27 DIAGNOSIS — M2040 Other hammer toe(s) (acquired), unspecified foot: Secondary | ICD-10-CM

## 2014-04-27 DIAGNOSIS — M2041 Other hammer toe(s) (acquired), right foot: Secondary | ICD-10-CM

## 2014-04-27 DIAGNOSIS — J3089 Other allergic rhinitis: Secondary | ICD-10-CM | POA: Diagnosis not present

## 2014-04-27 DIAGNOSIS — E1342 Other specified diabetes mellitus with diabetic polyneuropathy: Secondary | ICD-10-CM | POA: Diagnosis not present

## 2014-04-27 DIAGNOSIS — J301 Allergic rhinitis due to pollen: Secondary | ICD-10-CM | POA: Diagnosis not present

## 2014-04-27 DIAGNOSIS — G629 Polyneuropathy, unspecified: Secondary | ICD-10-CM

## 2014-04-27 NOTE — Patient Instructions (Signed)

## 2014-04-27 NOTE — Progress Notes (Signed)
Patient ID: [name], female   DOB: Feb 23, 1950, 64 y.o.   MRN: [id number] PATIENT PRESENTS FOR DIABETIC SHOE PICK UP. SHOES ARE TRIED ON FOR GOOD FIT.  WRITTEN BREAK IN AND WEAR INSTRUCTIONS GIVEN. PATIENT RECEIVED 1 PAIR APEX CLASSIC OXFORD STRAP  B3000W BLACK WOMEN'S SIZE 12 MEDIUM 3 PAIRS CUSTOM DIABETIC INSERTS NO NEW COMPLAINTS AT TODAY'S APPOINTMENT. FOLLOW UP AS SCHEDULED OR CALL SOONER IF THERE ARE ANY QUESTIONS/CONCERNS/CHANGE IN SYMPTOMS.

## 2014-05-03 DIAGNOSIS — J301 Allergic rhinitis due to pollen: Secondary | ICD-10-CM | POA: Diagnosis not present

## 2014-05-03 DIAGNOSIS — J3089 Other allergic rhinitis: Secondary | ICD-10-CM | POA: Diagnosis not present

## 2014-05-10 ENCOUNTER — Ambulatory Visit: Payer: Self-pay | Admitting: Internal Medicine

## 2014-05-11 DIAGNOSIS — J3089 Other allergic rhinitis: Secondary | ICD-10-CM | POA: Diagnosis not present

## 2014-05-11 DIAGNOSIS — J301 Allergic rhinitis due to pollen: Secondary | ICD-10-CM | POA: Diagnosis not present

## 2014-05-18 DIAGNOSIS — J301 Allergic rhinitis due to pollen: Secondary | ICD-10-CM | POA: Diagnosis not present

## 2014-05-18 DIAGNOSIS — J3089 Other allergic rhinitis: Secondary | ICD-10-CM | POA: Diagnosis not present

## 2014-05-20 ENCOUNTER — Encounter (HOSPITAL_COMMUNITY): Payer: Self-pay | Admitting: Emergency Medicine

## 2014-05-20 ENCOUNTER — Emergency Department (HOSPITAL_COMMUNITY): Payer: Medicare Other

## 2014-05-20 ENCOUNTER — Emergency Department (HOSPITAL_COMMUNITY)
Admission: EM | Admit: 2014-05-20 | Discharge: 2014-05-20 | Disposition: A | Discharge status: Home / Self Care | Payer: Medicare Other | Attending: Emergency Medicine | Admitting: Emergency Medicine

## 2014-05-20 DIAGNOSIS — K219 Gastro-esophageal reflux disease without esophagitis: Secondary | ICD-10-CM | POA: Insufficient documentation

## 2014-05-20 DIAGNOSIS — E876 Hypokalemia: Secondary | ICD-10-CM | POA: Diagnosis not present

## 2014-05-20 DIAGNOSIS — E785 Hyperlipidemia, unspecified: Secondary | ICD-10-CM | POA: Insufficient documentation

## 2014-05-20 DIAGNOSIS — Z720 Tobacco use: Secondary | ICD-10-CM | POA: Insufficient documentation

## 2014-05-20 DIAGNOSIS — Z79899 Other long term (current) drug therapy: Secondary | ICD-10-CM | POA: Diagnosis not present

## 2014-05-20 DIAGNOSIS — H269 Unspecified cataract: Secondary | ICD-10-CM | POA: Insufficient documentation

## 2014-05-20 DIAGNOSIS — Z880 Allergy status to penicillin: Secondary | ICD-10-CM | POA: Diagnosis not present

## 2014-05-20 DIAGNOSIS — E119 Type 2 diabetes mellitus without complications: Secondary | ICD-10-CM | POA: Diagnosis not present

## 2014-05-20 DIAGNOSIS — Z7982 Long term (current) use of aspirin: Secondary | ICD-10-CM | POA: Diagnosis not present

## 2014-05-20 DIAGNOSIS — M549 Dorsalgia, unspecified: Secondary | ICD-10-CM | POA: Insufficient documentation

## 2014-05-20 DIAGNOSIS — Z9089 Acquired absence of other organs: Secondary | ICD-10-CM | POA: Diagnosis not present

## 2014-05-20 DIAGNOSIS — Z86010 Personal history of colonic polyps: Secondary | ICD-10-CM | POA: Insufficient documentation

## 2014-05-20 DIAGNOSIS — I10 Essential (primary) hypertension: Secondary | ICD-10-CM | POA: Insufficient documentation

## 2014-05-20 DIAGNOSIS — Z8619 Personal history of other infectious and parasitic diseases: Secondary | ICD-10-CM | POA: Diagnosis not present

## 2014-05-20 DIAGNOSIS — R109 Unspecified abdominal pain: Secondary | ICD-10-CM | POA: Diagnosis not present

## 2014-05-20 DIAGNOSIS — Z8742 Personal history of other diseases of the female genital tract: Secondary | ICD-10-CM | POA: Diagnosis not present

## 2014-05-20 DIAGNOSIS — R110 Nausea: Secondary | ICD-10-CM | POA: Diagnosis not present

## 2014-05-20 DIAGNOSIS — G4700 Insomnia, unspecified: Secondary | ICD-10-CM | POA: Insufficient documentation

## 2014-05-20 DIAGNOSIS — Z7951 Long term (current) use of inhaled steroids: Secondary | ICD-10-CM | POA: Diagnosis not present

## 2014-05-20 DIAGNOSIS — K760 Fatty (change of) liver, not elsewhere classified: Secondary | ICD-10-CM | POA: Diagnosis not present

## 2014-05-20 LAB — CBC WITH DIFFERENTIAL/PLATELET
BASOS PCT: 0 % (ref 0–1)
Basophils Absolute: 0 10*3/uL (ref 0.0–0.1)
Eosinophils Absolute: 0.1 10*3/uL (ref 0.0–0.7)
Eosinophils Relative: 1 % (ref 0–5)
HCT: 39.5 % (ref 36.0–46.0)
Hemoglobin: 14 g/dL (ref 12.0–15.0)
Lymphocytes Relative: 48 % — ABNORMAL HIGH (ref 12–46)
Lymphs Abs: 3.7 10*3/uL (ref 0.7–4.0)
MCH: 32.3 pg (ref 26.0–34.0)
MCHC: 35.4 g/dL (ref 30.0–36.0)
MCV: 91 fL (ref 78.0–100.0)
Monocytes Absolute: 0.4 10*3/uL (ref 0.1–1.0)
Monocytes Relative: 5 % (ref 3–12)
NEUTROS ABS: 3.6 10*3/uL (ref 1.7–7.7)
NEUTROS PCT: 46 % (ref 43–77)
Platelets: 247 10*3/uL (ref 150–400)
RBC: 4.34 MIL/uL (ref 3.87–5.11)
RDW: 14 % (ref 11.5–15.5)
WBC: 7.8 10*3/uL (ref 4.0–10.5)

## 2014-05-20 LAB — URINALYSIS, ROUTINE W REFLEX MICROSCOPIC
BILIRUBIN URINE: NEGATIVE
GLUCOSE, UA: 100 mg/dL — AB
HGB URINE DIPSTICK: NEGATIVE
Ketones, ur: 15 mg/dL — AB
Leukocytes, UA: NEGATIVE
Nitrite: NEGATIVE
PH: 6 (ref 5.0–8.0)
Protein, ur: NEGATIVE mg/dL
SPECIFIC GRAVITY, URINE: 1.028 (ref 1.005–1.030)
UROBILINOGEN UA: 1 mg/dL (ref 0.0–1.0)

## 2014-05-20 LAB — BASIC METABOLIC PANEL
Anion gap: 11 (ref 5–15)
BUN: 9 mg/dL (ref 6–20)
CALCIUM: 9.3 mg/dL (ref 8.9–10.3)
CO2: 27 mmol/L (ref 22–32)
Chloride: 101 mmol/L (ref 101–111)
Creatinine, Ser: 0.57 mg/dL (ref 0.44–1.00)
GFR calc Af Amer: >60 mL/min (ref >60)
Glucose, Bld: 186 mg/dL — ABNORMAL HIGH (ref 70–99)
Potassium: 3.1 mmol/L — ABNORMAL LOW (ref 3.5–5.1)
Sodium: 139 mmol/L (ref 135–145)

## 2014-05-20 LAB — CBG MONITORING, ED: GLUCOSE-CAPILLARY: 146 mg/dL — AB (ref 70–99)

## 2014-05-20 MED ORDER — ONDANSETRON 4 MG PO TBDP
4.0000 mg | ORAL_TABLET | Freq: Once | ORAL | Status: AC
  Administered 2014-05-20: 4 mg via ORAL
  Filled 2014-05-20: qty 1

## 2014-05-20 MED ORDER — POTASSIUM CHLORIDE CRYS ER 20 MEQ PO TBCR
40.0000 meq | EXTENDED_RELEASE_TABLET | Freq: Once | ORAL | Status: AC
  Administered 2014-05-20: 40 meq via ORAL
  Filled 2014-05-20: qty 2

## 2014-05-20 MED ORDER — LORAZEPAM 1 MG PO TABS
0.5000 mg | ORAL_TABLET | Freq: Once | ORAL | Status: AC
  Administered 2014-05-20: 0.5 mg via ORAL
  Filled 2014-05-20: qty 1

## 2014-05-20 MED ORDER — HYDROCODONE-ACETAMINOPHEN 5-325 MG PO TABS
1.0000 | ORAL_TABLET | Freq: Once | ORAL | Status: AC
  Administered 2014-05-20: 1 via ORAL
  Filled 2014-05-20: qty 1

## 2014-05-20 MED ORDER — HYDROCODONE-ACETAMINOPHEN 5-325 MG PO TABS: 1.0000 | ORAL_TABLET | Freq: Four times a day (QID) | ORAL | Status: DC | PRN

## 2014-05-20 NOTE — ED Provider Notes (Signed)
CSN: [id number]     Arrival date & time 05/20/14  5093 History   First MD Initiated Contact with Patient 05/20/14 [phone]     Chief Complaint  Patient presents with  . Flank Pain     (Consider location/radiation/quality/duration/timing/severity/associated sxs/prior Treatment) Patient is a 64 y.o. female presenting with flank pain. The history is provided by the patient.  Flank Pain Associated symptoms include abdominal pain. Pertinent negatives include no chest pain, no headaches and no shortness of breath.   patient with acute onset of right flank pain at 7 this morning. Felt fine yesterday. Pain is 8 out of 10 constant sharp and achy made worse by movement. No fevers no vomiting, did have nausea, no diarrhea, no dysuria, no hematuria. No history of similar pain. Patient was lifting heavy object yesterday and could be responsible for the pain. Patient is a diabetic.  Past Medical History  Diagnosis Date  . Anxiety   . Cataract   . DM2 (diabetes mellitus, type 2)   . Hyperlipidemia   . Hypertension     essential, benign   . Skin lesion   . Insomnia   . Weakness   . Screening for malignant neoplasm of the cervix   . Tobacco abuse   . Palpitations     hx  . Routine general medical examination at a health care facility   . Dyslipidemia   . Disturbance of skin sensation     facial paresthesia; left  . Menopausal syndrome   . GERD (gastroesophageal reflux disease)   . AR (allergic rhinitis)   . Colon polyps     hyperplastic  . Hemorrhoids   . SVT (supraventricular tachycardia)   . Allergy    Past Surgical History  Procedure Laterality Date  . Abdominal hysterectomy  1990    For fibroids benign.  Still has ovaries  . Appendectomy    . Dental extractions    . Sigmoidoscopy  2004  . Colonoscopy  2009  . Ablation for svt  9/09    Dr. Lovena Le   Family History  Problem Relation Age of Onset  . Coronary artery disease      female, 1st degree relative <50  . Heart attack Father  28  . Hypertension Mother   . Stroke Mother   . Diabetes Mother   . Stomach cancer Mother   . Cancer Mother   . Stomach cancer Maternal Aunt    History  Substance Use Topics  . Smoking status: Current Every Day Smoker -- 1.00 packs/day    Types: Cigarettes  . Smokeless tobacco: Never Used  . Alcohol Use: 7.0 oz/week    14 Standard drinks or equivalent per week     Comment: beer every 3-4 months    OB History    No data available     Review of Systems  Constitutional: Negative for fever.  HENT: Negative for congestion.   Respiratory: Negative for shortness of breath.   Cardiovascular: Negative for chest pain.  Gastrointestinal: Positive for nausea and abdominal pain. Negative for vomiting and diarrhea.  Genitourinary: Positive for flank pain. Negative for dysuria and hematuria.  Musculoskeletal: Positive for back pain. Negative for neck pain.  Skin: Negative for rash.  Neurological: Negative for headaches.  Hematological: Does not bruise/bleed easily.  Psychiatric/Behavioral: Negative for confusion.      Allergies  Alcohol swabs; Amoxicillin-pot clavulanate; and Penicillins  Home Medications   Prior to Admission medications   Medication Sig Start Date End Date Taking? Authorizing  Provider  acetaminophen (TYLENOL) 325 MG tablet Take 650 mg by mouth every 6 (six) hours as needed for moderate pain.   Yes Historical Provider, MD  aspirin 81 MG chewable tablet Chew 1 tablet (81 mg total) by mouth daily. 06/07/13  Yes Josalyn Funches, MD  atenolol (TENORMIN) 25 MG tablet TAKE 1 TABLET BY MOUTH DAILY 01/17/14  Yes Olin Hauser, DO  atorvastatin (LIPITOR) 40 MG tablet Take 1 tablet (40 mg total) by mouth daily. 12/01/13  Yes Alexander Devin Going, DO  gabapentin (NEURONTIN) 300 MG capsule Take 2-3 capsules (600-900 mg total) by mouth 2 (two) times daily as needed. Patient taking differently: Take 600-900 mg by mouth 3 (three) times daily.  12/01/13  Yes Alexander J  Karamalegos, DO  glipiZIDE (GLUCOTROL) 5 MG tablet Take 1 tablet (5 mg total) by mouth daily. 12/01/13  Yes Alexander J Karamalegos, DO  hydrochlorothiazide (MICROZIDE) 12.5 MG capsule Take 1 capsule (12.5 mg total) by mouth daily. 12/01/13  Yes Alexander Devin Going, DO  levalbuterol [facility] HFA) 45 MCG/ACT inhaler Inhale 2 puffs into the lungs every 4 (four) hours as needed for wheezing or shortness of breath. Dr. Harold Hedge, Allergy and Immunology   Yes Historical Provider, MD  LORazepam (ATIVAN) 0.5 MG tablet Take 1 tablet (0.5 mg total) by mouth 2 (two) times daily as needed for anxiety. Patient taking differently: Take 0.5 mg by mouth 2 (two) times daily.  02/28/14  Yes Alexander J Karamalegos, DO  losartan (COZAAR) 50 MG tablet Take 1 tablet (50 mg total) by mouth daily. 12/01/13  Yes Alexander Devin Going, DO  metFORMIN (GLUCOPHAGE) 1000 MG tablet Take 1 tablet (1,000 mg total) by mouth 2 (two) times daily with a meal. 12/01/13  Yes Alexander J Karamalegos, DO  mometasone (NASONEX) 50 MCG/ACT nasal spray Place 2 sprays into the nose daily. 06/07/13  Yes Josalyn Funches, MD  pantoprazole (PROTONIX) 40 MG tablet Take 1 tablet (40 mg total) by mouth daily. 02/06/14  Yes Alexander Devin Going, DO  polyethylene glycol (MIRALAX / GLYCOLAX) packet Take 17 g by mouth every other day.   Yes Historical Provider, MD  traMADol (ULTRAM) 50 MG tablet Take 1 tablet (50 mg total) by mouth every 8 (eight) hours as needed. Patient taking differently: Take 50 mg by mouth 2 (two) times daily.  12/01/13  Yes Alexander Devin Going, DO  HYDROcodone-acetaminophen (NORCO/VICODIN) 5-325 MG per tablet Take 1-2 tablets by mouth every 6 (six) hours as needed for moderate pain. 05/20/14   Fredia Sorrow, MD  Lancets [facility] ULTRASOFT) lancets Check blood sugar 3 times daily. ICD-10 CODE E11.40 02/20/14   Olin Hauser, DO  meclizine (ANTIVERT) 25 MG tablet Take 1-2 tablets (25-50 mg total) by mouth 2 (two)  times daily as needed for dizziness. if having symptoms.  May repeat in 12 hours later. 12/01/13   Olin Hauser, DO  Olopatadine HCl (PATANASE) 0.6 % SOLN 1 drop (1 puff total) by Other route every morning. 06/07/13   Boykin Nearing, MD  sucralfate (CARAFATE) 1 GM/10ML suspension Take 10 mLs (1 g total) by mouth 4 (four) times daily as needed (reflux). 12/01/13   Alexander J Karamalegos, DO   BP 127/75 mmHg  Pulse 68  Temp(Src) 98.2 F (36.8 C) (Oral)  Resp 16  SpO2 100% Physical Exam  Constitutional: She is oriented to person, place, and time. She appears well-developed and well-nourished. No distress.  HENT:  Head: Normocephalic and atraumatic.  Mouth/Throat: Oropharynx is clear and moist.  Eyes: Conjunctivae and EOM are normal. Pupils are equal, round, and reactive to light.  Neck: Normal range of motion. Neck supple.  Cardiovascular: Normal rate, regular rhythm and normal heart sounds.   No murmur heard. Pulmonary/Chest: Effort normal and breath sounds normal. No respiratory distress.  Abdominal: Soft. Bowel sounds are normal. There is no tenderness.  Musculoskeletal: Normal range of motion.  Neurological: She is alert and oriented to person, place, and time. No cranial nerve deficit. She exhibits normal muscle tone. Coordination normal.  Skin: Skin is warm. No rash noted.  Nursing note and vitals reviewed.   ED Course  Procedures (including critical care time) Labs Review Labs Reviewed  CBC WITH DIFFERENTIAL/PLATELET - Abnormal; Notable for the following:    Lymphocytes Relative 48 (*)    All other components within normal limits  BASIC METABOLIC PANEL - Abnormal; Notable for the following:    Potassium 3.1 (*)    Glucose, Bld 186 (*)    All other components within normal limits  URINALYSIS, ROUTINE W REFLEX MICROSCOPIC - Abnormal; Notable for the following:    Color, Urine AMBER (*)    APPearance CLOUDY (*)    Glucose, UA 100 (*)    Ketones, ur 15 (*)     All other components within normal limits  CBG MONITORING, ED - Abnormal; Notable for the following:    Glucose-Capillary 146 (*)    All other components within normal limits   Results for orders placed or performed during the hospital encounter of 05/20/14  CBC with Differential/Platelet  Result Value Ref Range   WBC 7.8 4.0 - 10.5 K/uL   RBC 4.34 3.87 - 5.11 MIL/uL   Hemoglobin 14.0 12.0 - 15.0 g/dL   HCT 39.5 36.0 - 46.0 %   MCV 91.0 78.0 - 100.0 fL   MCH 32.3 26.0 - 34.0 pg   MCHC 35.4 30.0 - 36.0 g/dL   RDW 14.0 11.5 - 15.5 %   Platelets 247 150 - 400 K/uL   Neutrophils Relative % 46 43 - 77 %   Neutro Abs 3.6 1.7 - 7.7 K/uL   Lymphocytes Relative 48 (H) 12 - 46 %   Lymphs Abs 3.7 0.7 - 4.0 K/uL   Monocytes Relative 5 3 - 12 %   Monocytes Absolute 0.4 0.1 - 1.0 K/uL   Eosinophils Relative 1 0 - 5 %   Eosinophils Absolute 0.1 0.0 - 0.7 K/uL   Basophils Relative 0 0 - 1 %   Basophils Absolute 0.0 0.0 - 0.1 K/uL  Basic metabolic panel  Result Value Ref Range   Sodium 139 135 - 145 mmol/L   Potassium 3.1 (L) 3.5 - 5.1 mmol/L   Chloride 101 101 - 111 mmol/L   CO2 27 22 - 32 mmol/L   Glucose, Bld 186 (H) 70 - 99 mg/dL   BUN 9 6 - 20 mg/dL   Creatinine, Ser 0.57 0.44 - 1.00 mg/dL   Calcium 9.3 8.9 - 10.3 mg/dL   GFR calc non Af Amer >60 >60 mL/min   GFR calc Af Amer >60 >60 mL/min   Anion gap 11 5 - 15  Urinalysis, Routine w reflex microscopic  Result Value Ref Range   Color, Urine AMBER (A) YELLOW   APPearance CLOUDY (A) CLEAR   Specific Gravity, Urine 1.028 1.005 - 1.030   pH 6.0 5.0 - 8.0   Glucose, UA 100 (A) NEGATIVE mg/dL   Hgb urine dipstick NEGATIVE NEGATIVE   Bilirubin Urine NEGATIVE NEGATIVE  Ketones, ur 15 (A) NEGATIVE mg/dL   Protein, ur NEGATIVE NEGATIVE mg/dL   Urobilinogen, UA 1.0 0.0 - 1.0 mg/dL   Nitrite NEGATIVE NEGATIVE   Leukocytes, UA NEGATIVE NEGATIVE  CBG monitoring, ED  Result Value Ref Range   Glucose-Capillary 146 (H) 70 - 99 mg/dL     Imaging Review Ct Renal Stone Study  05/20/2014   CLINICAL DATA:  RIGHT flank pain since 0700 hours, no history of kidney stones, past history hypertension, type 2 diabetes, smoker  EXAM: CT ABDOMEN AND PELVIS WITHOUT CONTRAST  TECHNIQUE: Multidetector CT imaging of the abdomen and pelvis was performed following the standard protocol without IV contrast. Sagittal and coronal MPR images reconstructed from axial data set. Oral contrast not administered.  COMPARISON:  01/05/2009  FINDINGS: Lung bases clear.  No urinary tract calcification, hydronephrosis or ureteral dilatation.  Tiny calcifications at the renal hila appear to be vascular in origin on coronal images.  Heterogeneity of hepatic attenuation superiorly, potentially geographic fatty infiltration though difficult to exclude subtle mass.  Minimal focal sparing adjacent to gallbladder fossa.  Remainder of liver, spleen, pancreas, kidneys, and adrenal glands normal.  Uterus and appendix surgically absent with normal sized ovaries.  Normal bladder and ureters.  Stomach and bowel loops grossly unremarkable for technique.  No mass, adenopathy, free fluid, free air, or hernia.  Bones demineralized with few scattered sclerotic foci question bone islands.  IMPRESSION: Mild fatty infiltration of liver with inhomogeneous slightly lower attenuation within the superior aspect of the RIGHT lobe, may be related to geographic fat but cannot completely exclude underlying hepatic mass lesion with this appearance; followup MR imaging with and without contrast recommended to characterize.  No other intra-abdominal or intrapelvic abnormalities identified.  Specifically no cause for RIGHT flank pain seen.   Electronically Signed   By: Lavonia Dana M.D.   On: 05/20/2014 14:16     EKG Interpretation None      MDM   Final diagnoses:  Flank pain  Hypokalemia    Patient with acute onset of right flank pain as 7 this morning associated with some nausea no diarrhea  no vomiting no difficulty with urinating no hematuria. Pain 8 out of 10 sharp ache worse with movement suggestive of musculoskeletal. CT scan without any specific abnormalities other than some concern perhaps for a liver lobe mass MRI recommended for follow-up. Patient aware of that. However patient has difficulty even going through CAT scans will probably require specific sedation to have that done. Patient's potassium was low patient given some oral potassium here and she has follow-up with her doctor next week where she had her potassium rechecked. Renal function is normal and the no evidence of urinary tract infection.    Fredia Sorrow, MD 05/20/14 [phone]

## 2014-05-20 NOTE — ED Notes (Signed)
States right sided flank pain started at 7am, does not remember injuring back, denies UTI symptoms.

## 2014-05-20 NOTE — ED Notes (Signed)
Has decided to try the CT again; notified CT.

## 2014-05-20 NOTE — ED Notes (Addendum)
Asked patient if she would attempt the CT scan again. She states no. Notified Zackowski, MD. Patient states she can move better now; back hurts less, rates it 4/10.

## 2014-05-20 NOTE — ED Notes (Signed)
Patient CBG was 146.

## 2014-05-20 NOTE — ED Notes (Signed)
Pt states "forgot to lorazepam 0.5mg  this am-- don't need it right now, but will let you know"

## 2014-05-20 NOTE — ED Notes (Signed)
Patient being transported to CT at this time 

## 2014-05-20 NOTE — ED Notes (Signed)
Patient returned from CT

## 2014-05-20 NOTE — Discharge Instructions (Signed)
Workup for the right-sided flank pain without any specific findings. May be musculoskeletal in nature. Take the hydrocodone as directed. Follow-up with your regular doctor to have your potassium rechecked. Also the CT scan did raise some concern for a potential mass in the liver and the recommending MR scan to further evaluate this. Recommend that you follow-up with your regular doctor about this.

## 2014-05-21 ENCOUNTER — Encounter: Payer: Self-pay | Admitting: Family Medicine

## 2014-05-21 ENCOUNTER — Ambulatory Visit (INDEPENDENT_AMBULATORY_CARE_PROVIDER_SITE_OTHER): Payer: Medicare Other | Admitting: Family Medicine

## 2014-05-21 VITALS — BP 131/84 | HR 97 | Temp 99.2°F | Wt 185.0 lb

## 2014-05-21 DIAGNOSIS — E876 Hypokalemia: Secondary | ICD-10-CM | POA: Diagnosis not present

## 2014-05-21 DIAGNOSIS — E1140 Type 2 diabetes mellitus with diabetic neuropathy, unspecified: Secondary | ICD-10-CM

## 2014-05-21 DIAGNOSIS — M545 Low back pain, unspecified: Secondary | ICD-10-CM

## 2014-05-21 DIAGNOSIS — K760 Fatty (change of) liver, not elsewhere classified: Secondary | ICD-10-CM

## 2014-05-21 DIAGNOSIS — G8929 Other chronic pain: Secondary | ICD-10-CM | POA: Insufficient documentation

## 2014-05-21 LAB — POCT GLYCOSYLATED HEMOGLOBIN (HGB A1C): Hemoglobin A1C: 7.6

## 2014-05-21 MED ORDER — CYCLOBENZAPRINE HCL 10 MG PO TABS: 5.0000 mg | ORAL_TABLET | Freq: Three times a day (TID) | ORAL | Status: DC | PRN

## 2014-05-21 NOTE — Patient Instructions (Addendum)
Dear [name], Thank you for coming in to clinic today. It was good to see you!  1. I think that your Low Back Pain is related to a muscle spasm - since it is slowly improving, may take up to few weeks to fully resolve. 2. Finish Hydrocodone for ER. Then may take your Tramadol as needed. 3. Prescribed Flexeril 10mg  (muscle relaxant) - start with half tablet every 8 hours or 3 times a day as needed for pain, can make you sleepy be careful about driving. If tolerating, can take a whole tablet per dose up to 3 times daily 4. Continue to take Tylenol once off of the Hydrocodone. May also try Aleve over the counter twice daily with food for up to 1 week then stop. 5. Use heating pad or moist hot towel on your lower back. Can use ice packs if helpful  For low potassium - eat potassium rich foods (bananas, spinach greens, green leafy vegetables - see food list below)  For MRI - At this time, we are not positive if there even is a mass. It may be absolutely nothing. We will work on ordering the MRI over next 1-2 months, and will call you with more information.  Some important numbers from today's visit: HgA1C - 7.6 (05/20/14)  Please schedule a follow-up appointment with Dr. Parks Ranger in 2 to 4 weeks to follow-up back pain / abdominal pain - and discuss MRI  If you have any other questions or concerns, please feel free to call the clinic to contact me. You may also schedule an earlier appointment if necessary.  However, if your symptoms get significantly worse, please go to the Emergency Department to seek immediate medical attention.  Nobie Putnam, DO [facility]   Potassium Content of Foods Potassium is a mineral found in many foods and drinks. It helps keep fluids and minerals balanced in your body and affects how steadily your heart beats. Potassium also helps control your blood pressure and keep your muscles and nervous system healthy. Certain health conditions  and medicines may change the balance of potassium in your body. When this happens, you can help balance your level of potassium through the foods that you do or do not eat. Your health care provider or dietitian may recommend an amount of potassium that you should have each day. The following lists of foods provide the amount of potassium (in parentheses) per serving in each item. HIGH IN POTASSIUM  The following foods and beverages have 200 mg or more of potassium per serving:  Apricots, 2 raw or 5 dry (200 mg).  Artichoke, 1 medium (345 mg).  Avocado, raw,  each (245 mg).  Banana, 1 medium (425 mg).  Beans, lima, or baked beans, canned,  cup (280 mg).  Beans, white, canned,  cup (595 mg).  Beef roast, 3 oz (320 mg).  Beef, ground, 3 oz (270 mg).  Beets, raw or cooked,  cup (260 mg).  Bran muffin, 2 oz (300 mg).  Broccoli,  cup (230 mg).  Brussels sprouts,  cup (250 mg).  Cantaloupe,  cup (215 mg).  Cereal, 100% bran,  cup (200-400 mg).  Cheeseburger, single, fast food, 1 each (225-400 mg).  Chicken, 3 oz (220 mg).  Clams, canned, 3 oz (535 mg).  Crab, 3 oz (225 mg).  Dates, 5 each (270 mg).  Dried beans and peas,  cup (300-475 mg).  Figs, dried, 2 each (260 mg).  Fish: halibut, tuna, cod, snapper, 3 oz (  480 mg).  Fish: salmon, haddock, swordfish, perch, 3 oz (300 mg).  Fish, tuna, canned 3 oz (200 mg).  [place] fries, fast food, 3 oz (470 mg).  Granola with fruit and nuts,  cup (200 mg).  Grapefruit juice,  cup (200 mg).  Greens, beet,  cup (655 mg).  Honeydew melon,  cup (200 mg).  Kale, raw, 1 cup (300 mg).  Kiwi, 1 medium (240 mg).  Kohlrabi, rutabaga, parsnips,  cup (280 mg).  Lentils,  cup (365 mg).  Mango, 1 each (325 mg).  Milk, chocolate, 1 cup (420 mg).  Milk: nonfat, low-fat, whole, buttermilk, 1 cup (350-380 mg).  Molasses, 1 Tbsp (295 mg).  Mushrooms,  cup (280) mg.  Nectarine, 1 each (275 mg).  Nuts:  almonds, peanuts, hazelnuts, [place], cashew, mixed, 1 oz (200 mg).  Nuts, pistachios, 1 oz (295 mg).  Orange, 1 each (240 mg).  Orange juice,  cup (235 mg).  Papaya, medium,  fruit (390 mg).  Peanut butter, chunky, 2 Tbsp (240 mg).  Peanut butter, smooth, 2 Tbsp (210 mg).  Pear, 1 medium (200 mg).  Pomegranate, 1 whole (400 mg).  Pomegranate juice,  cup (215 mg).  Pork, 3 oz (350 mg).  Potato chips, salted, 1 oz (465 mg).  Potato, baked with skin, 1 medium (925 mg).  Potatoes, boiled,  cup (255 mg).  Potatoes, mashed,  cup (330 mg).  Prune juice,  cup (370 mg).  Prunes, 5 each (305 mg).  Pudding, chocolate,  cup (230 mg).  Pumpkin, canned,  cup (250 mg).  Raisins, seedless,  cup (270 mg).  Seeds, sunflower or pumpkin, 1 oz (240 mg).  Soy milk, 1 cup (300 mg).  Spinach,  cup (420 mg).  Spinach, canned,  cup (370 mg).  Sweet potato, baked with skin, 1 medium (450 mg).  Swiss chard,  cup (480 mg).  Tomato or vegetable juice,  cup (275 mg).  Tomato sauce or puree,  cup (400-550 mg).  Tomato, raw, 1 medium (290 mg).  Tomatoes, canned,  cup (200-300 mg).  [place], 3 oz (250 mg).  Wheat germ, 1 oz (250 mg).  Winter squash,  cup (250 mg).  Yogurt, plain or fruited, 6 oz (260-435 mg).  Zucchini,  cup (220 mg). MODERATE IN POTASSIUM The following foods and beverages have 50-200 mg of potassium per serving:  Apple, 1 each (150 mg).  Apple juice,  cup (150 mg).  Applesauce,  cup (90 mg).  Apricot nectar,  cup (140 mg).  Asparagus, small spears,  cup or 6 spears (155 mg).  Bagel, cinnamon raisin, 1 each (130 mg).  Bagel, egg or plain, 4 in., 1 each (70 mg).  Beans, green,  cup (90 mg).  Beans, yellow,  cup (190 mg).  Beer, regular, 12 oz (100 mg).  Beets, canned,  cup (125 mg).  Blackberries,  cup (115 mg).  Blueberries,  cup (60 mg).  Bread, whole wheat, 1 slice (70 mg).  Broccoli, raw,  cup (145  mg).  Cabbage,  cup (150 mg).  Carrots, cooked or raw,  cup (180 mg).  Cauliflower, raw,  cup (150 mg).  Celery, raw,  cup (155 mg).  Cereal, bran flakes, cup (120-150 mg).  Cheese, cottage,  cup (110 mg).  Cherries, 10 each (150 mg).  Chocolate, 1 oz bar (165 mg).  Coffee, brewed 6 oz (90 mg).  Corn,  cup or 1 ear (195 mg).  Cucumbers,  cup (80 mg).  Egg, large, 1 each (60 mg).  Eggplant,  cup (60 mg).  Endive, raw, cup (80 mg).  English muffin, 1 each (65 mg).  Fish, orange roughy, 3 oz (150 mg).  Frankfurter, beef or pork, 1 each (75 mg).  Fruit cocktail,  cup (115 mg).  Grape juice,  cup (170 mg).  Grapefruit,  fruit (175 mg).  Grapes,  cup (155 mg).  Greens: kale, turnip, collard,  cup (110-150 mg).  Ice cream or frozen yogurt, chocolate,  cup (175 mg).  Ice cream or frozen yogurt, vanilla,  cup (120-150 mg).  Lemons, limes, 1 each (80 mg).  Lettuce, all types, 1 cup (100 mg).  Mixed vegetables,  cup (150 mg).  Mushrooms, raw,  cup (110 mg).  Nuts: walnuts, pecans, or macadamia, 1 oz (125 mg).  Oatmeal,  cup (80 mg).  Okra,  cup (110 mg).  Onions, raw,  cup (120 mg).  Peach, 1 each (185 mg).  Peaches, canned,  cup (120 mg).  Pears, canned,  cup (120 mg).  Peas, green, frozen,  cup (90 mg).  Peppers, green,  cup (130 mg).  Peppers, red,  cup (160 mg).  Pineapple juice,  cup (165 mg).  Pineapple, fresh or canned,  cup (100 mg).  Plums, 1 each (105 mg).  Pudding, vanilla,  cup (150 mg).  Raspberries,  cup (90 mg).  Rhubarb,  cup (115 mg).  Rice, wild,  cup (80 mg).  Shrimp, 3 oz (155 mg).  Spinach, raw, 1 cup (170 mg).  Strawberries,  cup (125 mg).  Summer squash  cup (175-200 mg).  Swiss chard, raw, 1 cup (135 mg).  Tangerines, 1 each (140 mg).  Tea, brewed, 6 oz (65 mg).  Turnips,  cup (140 mg).  Watermelon,  cup (85 mg).  Wine, red, table, 5 oz (180 mg).  Wine,  white, table, 5 oz (100 mg). LOW IN POTASSIUM The following foods and beverages have less than 50 mg of potassium per serving.  Bread, white, 1 slice (30 mg).  Carbonated beverages, 12 oz (less than 5 mg).  Cheese, 1 oz (20-30 mg).  Cranberries,  cup (45 mg).  Cranberry juice cocktail,  cup (20 mg).  Fats and oils, 1 Tbsp (less than 5 mg).  Hummus, 1 Tbsp (32 mg).  Nectar: papaya, mango, or pear,  cup (35 mg).  Rice, white or brown,  cup (50 mg).  Spaghetti or macaroni,  cup cooked (30 mg).  Tortilla, flour or corn, 1 each (50 mg).  Waffle, 4 in., 1 each (50 mg).  Water chestnuts,  cup (40 mg). Document Released: 08/12/2004 Document Revised: 01/03/2013 Document Reviewed: 11/25/2012 [facility] Patient Information 2015 [place], [place]. This information is not intended to replace advice given to you by your health care provider. Make sure you discuss any questions you have with your health care provider.

## 2014-05-21 NOTE — Progress Notes (Signed)
   Subjective:    Patient ID: [name], female    DOB: Jul 20, 1950, 64 y.o.   MRN: [id number]  HPI  ER FOLLOW-UP - RIGHT FLANK / LBP: - Presents today for follow-up after [facility]ED visit on 05/20/14 for Right flank pain, woke up with pain on 5/8, described as 8/10, acute onset sharp achy, worse with movement. No significant associated symptoms at time without n/v. Did report lifting some heavy objects the previous day (Saturday, was carrying two 2L soda bottles in each hand out of grocery store). Work-up in ED with BMET (remarkable only for hypoK 3.1, hyperglycemia to 186, nml Cr), CBC (unremarkable, nml WBC), UA (unremarkable, no protein, neg nitrite, no leuks, neg hgb) - Additionally in ED had CT Stone Study that showed no identified nephrolithiasis and no etiology of acute R-flank pain, notable for mild fatty infiltration of liver (superior R-lobe) possible geographic fat but cannot exclude underlying hepatic mass lesion, recommended follow-up MR with and without contrast. Advised would need sedation (reports h/o prior MRI 15 years ago unable to tolerate due to claustrophobia). - Patient reviewed presentation to ED yesterday with description R-lower back / flank pain said she was initially fine when woke up at 5am yesterday to use restroom and felt fine after. She got up again around 7am to use restroom and felt acute onset "sharp, aching, tight feeling" that brought her to the ED, received some Hydrocodone and Ativan in ED, then by time she left about 4pm, overall felt a little better, given rx for Norco 5/325 #14 tabs, which she is taking regularly q 6 hr with some improvement (holding Tramadol while on Norco). - Today seems overall improved with "less sharpness to pain", stated that when left ED yesterday may have been 8/10 and currently pain 6/10, tried ice packs with improvement. - Never had similar pain before. No history of kidney stones. - Taking Miralax as needed for constipation. Recently  regular BMs - Denies fevers/chills, nausea / vomiting, symptoms related to food or exertion, hematuria, dysuria, dark stools or rectal bleeding  I have reviewed and updated the following as appropriate: allergies and current medications  Social Hx: - Active smoker 0.5 ppd  Review of Systems  See above HPI    Objective:   Physical Exam  BP 131/84 mmHg  Pulse 97  Temp(Src) 99.2 F (37.3 C) (Oral)  Wt 185 lb (83.915 kg)  Gen - well-appearing, comfortable and cooperative, NAD HEENT - oropharynx clear, MMM Heart - RRR, no murmurs heard Lungs - CTAB, no wheezing, crackles, or rhonchi. Normal work of breathing. Abd - obese, soft, minimal RLQ vs more lateral tenderness to deeper palpation otherwise no RUQ tenderness, negative Murphy's, no distention, no masses, +active BS MSK - Back: no deformity, non-tender over spinous processes, Right lower paraspinal lumbar muscles with tenderness to palpation and appreciable hypertonicity/spasm. SLR negative without radicular symptoms. No CVAT. Ext - non-tender, minimal lower ankle edema, peripheral pulses intact +2 b/l Skin - warm, dry, no rashes Neuro - awake, alert, oriented, grossly non-focal, gait normal     Assessment & Plan:   See specific A&P problem list for details.

## 2014-05-22 DIAGNOSIS — K760 Fatty (change of) liver, not elsewhere classified: Secondary | ICD-10-CM | POA: Insufficient documentation

## 2014-05-22 DIAGNOSIS — E876 Hypokalemia: Secondary | ICD-10-CM | POA: Insufficient documentation

## 2014-05-22 NOTE — Assessment & Plan Note (Signed)
To 3.1 in ED on 5/8, given PO K x 1 dose.  Plan: 1. Advised pt to increase potassium rich foods diet - given handout 2. To soon to re-check lab. Follow-up in 2-4 weeks, review history and consider repeat if needed

## 2014-05-22 NOTE — Assessment & Plan Note (Signed)
Recent likely incidental findings on CT Stone Study 5/8 in ED - no etiology identified for flank pain. Results with mild fatty infiltration of liver (superior R-lobe) possible geographic fat but cannot exclude underlying hepatic mass lesion, recommended follow-up MR with and without contrast. However pt would need sedation and requested anesthesia (reports h/o prior MRI 15 years ago unable to tolerate due to claustrophobia).  Plan: 1. Called Radiologist (Dr. Thornton Papas) who had initially read CT study, discussed case and he says that it does warrant further imaging but may just be geographic fat, difficult to discern from any mass in that region, would need CT Abdomen with contrast vs MRI. Advised that pt not candidate for MRI, then agreed CT would work. Ultrasound is not ideal option for body habitus to see the detail needed. 2. Will discuss with patient at follow-up 2-4 weeks

## 2014-05-22 NOTE — Assessment & Plan Note (Addendum)
Improved, initial eval in ED 05/20/14 essentially unremarkable work-up. Consistent with R-LBP with muscle spasm and radiation to R-flank, possibly related to carrying heavy 2L sodas day before onset. Improved with Norco, Tylenol, rest and ice packs, overall reassuring. Exam without significant findings, no radicular symptoms. Seems unlikely nephrolithiasis (UA in ED no hgb) no colicky symptoms. Considered GI etiology - no RUQ pain or n/v, not related to eating unlikely choley. S/p appendectomy. Regular BMs. - Imaging - CT Stone Study 5/8 in ED - no etiology identified for flank pain. Significant reported incidental finding of mild fatty infiltration of liver (superior R-lobe) possible geographic fat but cannot exclude underlying hepatic mass lesion, recommended follow-up MR with and without contrast. However pt would need sedation and requested anesthesia (reports h/o prior MRI 15 years ago unable to tolerate due to claustrophobia).  Plan: 1. Trial on Flexeril 10mg  TID PRN, can cut in half for 5mg  to start, cautioned with sedation also with other pain medicines 2. Can finished Norco PRN, then may use home Tramadol PRN. Advised can take Tylenol with Tramadol, and may try OTC Aleve as needed for several days if persistent. 3. Continue ice packs if relief, advised to start using heating pad / moist heat for pain relief, stretch and stay active with relative rest 4. Regarding mild fatty liver infiltration - called Radiologist (Dr. Thornton Papas) who had initially read CT study, discussed case and he says that it does warrant further imaging but may just be geographic fat, difficult to discern from any mass in that region, would need CT Abdomen with contrast vs MRI. Advised that pt not candidate for MRI, then agreed CT would work. Ultrasound is not ideal option for body habitus to see the detail needed. 5. RTC 2-4 weeks for re-evaluation. Return criteria given

## 2014-05-25 DIAGNOSIS — J301 Allergic rhinitis due to pollen: Secondary | ICD-10-CM | POA: Diagnosis not present

## 2014-05-25 DIAGNOSIS — J3089 Other allergic rhinitis: Secondary | ICD-10-CM | POA: Diagnosis not present

## 2014-05-28 ENCOUNTER — Encounter: Payer: Self-pay | Admitting: Family Medicine

## 2014-05-28 ENCOUNTER — Other Ambulatory Visit: Payer: Self-pay | Admitting: Family Medicine

## 2014-05-28 ENCOUNTER — Ambulatory Visit (INDEPENDENT_AMBULATORY_CARE_PROVIDER_SITE_OTHER): Payer: Medicare Other | Admitting: Family Medicine

## 2014-05-28 VITALS — BP 121/71 | HR 88 | Temp 98.2°F | Ht 66.0 in | Wt 189.0 lb

## 2014-05-28 DIAGNOSIS — M545 Low back pain, unspecified: Secondary | ICD-10-CM

## 2014-05-28 DIAGNOSIS — E876 Hypokalemia: Secondary | ICD-10-CM | POA: Diagnosis not present

## 2014-05-28 DIAGNOSIS — I10 Essential (primary) hypertension: Secondary | ICD-10-CM | POA: Diagnosis not present

## 2014-05-28 DIAGNOSIS — K760 Fatty (change of) liver, not elsewhere classified: Secondary | ICD-10-CM

## 2014-05-28 LAB — BASIC METABOLIC PANEL WITH GFR
BUN: 7 mg/dL (ref 6–23)
CO2: 29 mEq/L (ref 19–32)
CREATININE: 0.57 mg/dL (ref 0.50–1.10)
Calcium: 9.4 mg/dL (ref 8.4–10.5)
Chloride: 102 mEq/L (ref 96–112)
GFR, Est African American: >89 mL/min
Glucose, Bld: 200 mg/dL — ABNORMAL HIGH (ref 70–99)
Potassium: 3.5 mEq/L (ref 3.5–5.3)
SODIUM: 141 meq/L (ref 135–145)

## 2014-05-28 MED ORDER — CYCLOBENZAPRINE HCL 10 MG PO TABS: 5.0000 mg | ORAL_TABLET | Freq: Three times a day (TID) | ORAL | Status: DC | PRN

## 2014-05-28 NOTE — Assessment & Plan Note (Signed)
Significantly improved since last f/u on 5/9. 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 Reassuring w/o red flags or GI symptom - Completed Norco course, now on Tramadol / Tylenol PRN - Improved with Flexeril - requested refill incase pain returns - See "fatty liver infiltration" for details on prior incidental imaging finding and work-up plans   Plan: 1. Continue Tylenol / home Tramadol PRN 2. Sent refill Flexeril - not covered by ins, advised patient. Would switch to Baclofen if needed 3. Continue ice / heat PRN 4. RTC PRN, plan to schedule annual physical in August 2016

## 2014-05-28 NOTE — Assessment & Plan Note (Signed)
No significant symptoms. Maybe some muscle cramps. Last K 3.1 on 5/8 in ED  Plan: 1. Re-check K on BMET today - will call if results abnormal 2. Continue K rich diet

## 2014-05-28 NOTE — Assessment & Plan Note (Addendum)
Significantly improved R-LBP / flank pain on follow-up today. Suspect that findings on last CT Stone Study 5/8 with mild fatty liver infiltration were incidental and not related to presenting symptoms. However, pt remains concerned and would like to pursue work-up. - 5/8 CT results with mild fatty infiltration of liver (superior R-lobe) possible geographic fat but cannot exclude underlying hepatic mass lesion - Not candidate for MRI unable to tolerate due to claustrophobia  Plan: 1. Discussed case with Radiologist (Dr. Thornton Papas) on 5/10 - does warrant further imaging, cannot rule out mass in region of fatty infiltration. Okay to do CT Abd w/ contrast (unable to tolerate MRI, [place] not good enough visualization) 2. Called Radiology/CT, authorized verbal order for CT Abd with contrast (IV and PO), scheduled on 07/20/14 @ 10:00am. Confirmed with patient, she is aware to pick up oral contrast few days before. Start drinking contrast at 8:00am, arrive 9:45am.  3. Will call briefly with results once available, discuss in detail at next follow-up in August

## 2014-05-28 NOTE — Patient Instructions (Addendum)
Dear [name], Thank you for coming in to clinic today. It was good to see you!  1. I'm glad to hear that you are doing better. 2. Sent refill for Flexeril to [facility] - it may not be covered by insurance since you had received this rx already. If too expensive, call and let me know and we can switch to different muscle relaxant. 3. Keep using tylenol / tramadol as needed. May use heating pad / ice packs as needed 4. Blood pressure looks great 5. Check blood work today for potassium - will call if abnormal otherwise, keep up regular diet and may add those potassium rich foods  For your CT Abdominal scan - scheduled for Friday July 8th at 10:00am - please start drinking oral contrast dye around 8:00am - arrive around 9:45am. You will need to go to [facility] Radiology Department to pick up the contrast dye and get instructions.  Please schedule a follow-up appointment with Dr. Parks Ranger in August for Annual Physical - we will review your results at this appointment or sooner if urgent.  If you have any other questions or concerns, please feel free to call the clinic to contact me. You may also schedule an earlier appointment if necessary.  However, if your symptoms get significantly worse, please go to the Emergency Department to seek immediate medical attention.  Nobie Putnam, [place]

## 2014-05-28 NOTE — Assessment & Plan Note (Signed)
Well-controlled HTN. Improved BP today. At goal < 893/40 No complications   Plan:  1. Continue current BP meds 2. Continue regular exercise 3. Provided smoking cessation counseling - remains active smoker 4. RTC 6 to 12 months for BP

## 2014-05-28 NOTE — Progress Notes (Signed)
   Subjective:    Patient ID: [name], female    DOB: [birth year], 64 y.o.   MRN: [id number]  HPI  FOLLOW-UP RIGHT LOW BACK / FLANK PAIN: - Last OV at [facility] 5/9 with me for same complaint, initial follow-up from ED on 5/8. See prior note for details, briefly acute onset R-LBP/flank pain initially achy and sharp without significant associated symptoms, suspected MSK injury. Improved with analgesia, heat/ice and rest. - Presents today for follow-up. She reports overall much improved since Thursday last week, now only has occasional intermittent episodes of pain in Right lower back about 3/10, worse when first wakes up in morning feels "aggravated" but then gets better with activity, not every day but sometimes has "flare in afternoon". She continues to use ice / heat as needed with relief. Finished Norco. Now taking Tylenol and Tramadol PRN with relief. She additionally requested refill on Flexeril, as she is traveling to [place] to visit family 06/06/14 and wanted to have some medicine incase re-aggravates injury. - Additionally, discussion at last visit regarding CT Abd scan done in ED with concerns for possible fatty liver infiltration and ?mass. Discussed this CT image with the reading Radiologist Dr. Thornton Papas and he recommended that CT Abd with contrast would be appropriate, since pt is not candidate for MRI (anxiety and severe claustrophobia). Reviewed this plan with patient, she is eager to schedule this repeat CT Abd w/ contrast study, because she is concerned. - Still taking Miralax daily as advised by ED since she was taking Norco. - Denies nausea, vomiting, abdominal pain, hematuria, dysuria, constipation or diarrhea  HYPOKALEMIA: - In ED 5/8 BMET checked with HypoK to 3.1 at that time. Last OV I had advised her to eat higher potassium foods, printed handout. She admits to eating more bananas and greens. - Requested to re-check potassium today - Admits to occasional muscle cramps  CHRONIC  HTN: Reports no concerns. Checks occasionally at home about 120-140s / 70-80s. Scheduled to go to Cardiologist on Wednesday, 5/18. Current Meds - Atenolol 25mg  daily, HCTZ 12.5mg  daily, Losartan 50mg  daily Reports good compliance, took meds today. Tolerating well, w/o complaints. Denies CP, dyspnea, HA, edema, dizziness / lightheadedness  I have reviewed and updated the following as appropriate: allergies and current medications  Social Hx: - Active smoker 0.5 ppd  Review of Systems  See above HPI    Objective:   Physical Exam  BP 121/71 mmHg  Pulse 88  Temp(Src) 98.2 F (36.8 C) (Oral)  Ht 5\' 6"  (1.676 m)  Wt 189 lb (85.73 kg)  BMI 30.52 kg/m2  Gen - well-appearing, comfortable and cooperative, NAD Heart - RRR, no murmurs Abd - obese, soft, NTND, negative Murphy's, no masses, +active BS MSK - Back: no deformity, non-tender over spinous processes, Significantly improved Right lower lumbar paraspinal muscle spasm. Non-tender. No CVAT. Ext - non-tender, minimal lower ankle edema, peripheral pulses intact +2 b/l Skin - warm, dry, no rashes Neuro - awake, alert, oriented, grossly non-focal, gait normal     Assessment & Plan:   See specific A&P problem list for details.

## 2014-05-30 ENCOUNTER — Encounter: Payer: Self-pay | Admitting: Internal Medicine

## 2014-05-30 ENCOUNTER — Ambulatory Visit (INDEPENDENT_AMBULATORY_CARE_PROVIDER_SITE_OTHER): Payer: Medicare Other | Admitting: Internal Medicine

## 2014-05-30 VITALS — BP 102/62 | HR 86 | Ht 65.5 in | Wt 189.0 lb

## 2014-05-30 DIAGNOSIS — F17200 Nicotine dependence, unspecified, uncomplicated: Secondary | ICD-10-CM

## 2014-05-30 DIAGNOSIS — I10 Essential (primary) hypertension: Secondary | ICD-10-CM

## 2014-05-30 DIAGNOSIS — J3089 Other allergic rhinitis: Secondary | ICD-10-CM | POA: Diagnosis not present

## 2014-05-30 DIAGNOSIS — F411 Generalized anxiety disorder: Secondary | ICD-10-CM | POA: Diagnosis not present

## 2014-05-30 DIAGNOSIS — Z720 Tobacco use: Secondary | ICD-10-CM | POA: Diagnosis not present

## 2014-05-30 MED ORDER — ATENOLOL 25 MG PO TABS: 25.0000 mg | ORAL_TABLET | Freq: Every day | ORAL | Status: DC

## 2014-05-30 NOTE — Assessment & Plan Note (Signed)
She appears less anxious today then previously. Will follow.

## 2014-05-30 NOTE — Assessment & Plan Note (Signed)
Her blood pressure is stable today. She is encouraged to lose weight and reduce her sodium intake.

## 2014-05-30 NOTE — Patient Instructions (Signed)
Medication Instructions:  Your physician recommends that you continue on your current medications as directed. Please refer to the Current Medication list given to you today.  Labwork: None ordered  Testing/Procedures: None ordered  Follow-Up: Your physician wants you to follow-up in: 1 year with Dr. Lovena Le.  You will receive a reminder letter in the mail two months in advance. If you don't receive a letter, please call our office to schedule the follow-up appointment.   Thank you for choosing [facility]!!

## 2014-05-30 NOTE — Progress Notes (Signed)
HPI [name] returns today for followup. She is a very pleasant middle-age woman with a history of SVT status post catheter ablation. She has had no recurrent SVT or palpitations since her last visit. She has ongoing tobacco abuse and hypertension. She denies chest pain, shortness of breath, or syncope. She is pending a trip to see her cousin in [place]. She was in the ER with back pain.   Allergies  Allergen Reactions  . Alcohol Swabs [Isopropyl Alcohol] Nausea Only    Use peroxide instead  . Amoxicillin-Pot Clavulanate Other (See Comments)    REACTION: Unsure - maybe swollen?  Did not agree with her.  . Penicillins Other (See Comments)    REACTION: Unsure what reaction she has.     Current Outpatient Prescriptions  Medication Sig Dispense Refill  . acetaminophen (TYLENOL) 325 MG tablet Take 650 mg by mouth every 6 (six) hours as needed for moderate pain.    Marland Kitchen aspirin 81 MG chewable tablet Chew 1 tablet (81 mg total) by mouth daily. 100 tablet 0  . atenolol (TENORMIN) 25 MG tablet TAKE 1 TABLET BY MOUTH DAILY 90 tablet 2  . atorvastatin (LIPITOR) 40 MG tablet Take 1 tablet (40 mg total) by mouth daily. 90 tablet 4  . cyclobenzaprine (FLEXERIL) 10 MG tablet Take 0.5-1 tablets (5-10 mg total) by mouth 3 (three) times daily as needed for muscle spasms. 30 tablet 0  . gabapentin (NEURONTIN) 300 MG capsule Take 2-3 capsules (600-900 mg total) by mouth 2 (two) times daily as needed. 540 capsule 4  . glipiZIDE (GLUCOTROL) 5 MG tablet Take 1 tablet (5 mg total) by mouth daily. 90 tablet 4  . hydrochlorothiazide (MICROZIDE) 12.5 MG capsule Take 1 capsule (12.5 mg total) by mouth daily. 90 capsule 4  . HYDROcodone-acetaminophen (NORCO/VICODIN) 5-325 MG per tablet Take 1-2 tablets by mouth every 6 (six) hours as needed for moderate pain. 14 tablet 0  . levalbuterol (XOPENEX HFA) 45 MCG/ACT inhaler Inhale 2 puffs into the lungs every 4 (four) hours as needed for wheezing or shortness of breath. Dr.  Harold Hedge, Allergy and Immunology    . LORazepam (ATIVAN) 0.5 MG tablet Take 1 tablet (0.5 mg total) by mouth 2 (two) times daily as needed for anxiety. (Patient taking differently: Take 0.5 mg by mouth 2 (two) times daily. ) 60 tablet 5  . losartan (COZAAR) 50 MG tablet Take 1 tablet (50 mg total) by mouth daily. 90 tablet 4  . meclizine (ANTIVERT) 25 MG tablet Take 1-2 tablets (25-50 mg total) by mouth 2 (two) times daily as needed for dizziness. if having symptoms.  May repeat in 12 hours later. 30 tablet 2  . metFORMIN (GLUCOPHAGE) 1000 MG tablet Take 1 tablet (1,000 mg total) by mouth 2 (two) times daily with a meal. 180 tablet 4  . mometasone (NASONEX) 50 MCG/ACT nasal spray Place 2 sprays into the nose daily. 17 g 3  . Olopatadine HCl (PATANASE) 0.6 % SOLN 1 drop (1 puff total) by Other route every morning. 30.5 g 0  . pantoprazole (PROTONIX) 40 MG tablet Take 1 tablet (40 mg total) by mouth daily. 90 tablet 4  . polyethylene glycol (MIRALAX / GLYCOLAX) packet Take 17 g by mouth every other day.    . sucralfate (CARAFATE) 1 GM/10ML suspension Take 10 mLs (1 g total) by mouth 4 (four) times daily as needed (reflux). 420 mL 0  . traMADol (ULTRAM) 50 MG tablet Take 1 tablet (50 mg total) by mouth  every 8 (eight) hours as needed. (Patient taking differently: Take 50 mg by mouth 2 (two) times daily. ) 270 tablet 2  . Lancets (ONETOUCH ULTRASOFT) lancets Check blood sugar 3 times daily. ICD-10 CODE E11.40 100 each 5  . ONE TOUCH ULTRA TEST test strip      No current facility-administered medications for this visit.     Past Medical History  Diagnosis Date  . Anxiety   . Cataract   . DM2 (diabetes mellitus, type 2)   . Hyperlipidemia   . Hypertension     essential, benign   . Skin lesion   . Insomnia   . Weakness   . Screening for malignant neoplasm of the cervix   . Tobacco abuse   . Palpitations     hx  . Routine general medical examination at a health care facility   .  Dyslipidemia   . Disturbance of skin sensation     facial paresthesia; left  . Menopausal syndrome   . GERD (gastroesophageal reflux disease)   . AR (allergic rhinitis)   . Colon polyps     hyperplastic  . Hemorrhoids   . SVT (supraventricular tachycardia)   . Allergy     ROS:   All systems reviewed and negative except as noted in the HPI.   Past Surgical History  Procedure Laterality Date  . Abdominal hysterectomy  1990    For fibroids benign.  Still has ovaries  . Appendectomy    . Dental extractions    . Sigmoidoscopy  2004  . Colonoscopy  2009  . Ablation for svt  9/09    Dr. Lovena Le     Family History  Problem Relation Age of Onset  . Coronary artery disease      female, 1st degree relative <50  . Heart attack Father 40  . Hypertension Mother   . Stroke Mother   . Diabetes Mother   . Stomach cancer Mother   . Cancer Mother   . Stomach cancer Maternal Aunt      History   Social History  . Marital Status: Married    Spouse Name: N/A  . Number of Children: 3  . Years of Education: 13   Occupational History  . Retired    Social History Main Topics  . Smoking status: Current Every Day Smoker -- 1.00 packs/day    Types: Cigarettes  . Smokeless tobacco: Never Used  . Alcohol Use: 7.0 oz/week    14 Standard drinks or equivalent per week     Comment: beer every 3-4 months   . Drug Use: No  . Sexual Activity: Not on file   Other Topics Concern  . Not on file   Social History Narrative   Married, does not get regular exercise. On disability.    2 miscarriages, one at 4 and one at 5 months; death at 1 day, unsure of cause.       Health Care POA:    Emergency Contact: Jeanene Erb (friend) [phone] Terri Piedra (friend's daughter) [phone]   End of Life Plan: Gave pt pamplet   Who lives with you: husband   Any pets: 0   Diet: pt has varied diet   Exercise: pt is disabled and does not have a regular exercise routine   Seatbelts: Pt  reports wearing seatbelt when in vehicle   Sun Exposure/Protection: Pt reports wearing a hat, sunglasses and facial sunscreen when outside   Hobbies: shopping and traveling  BP 102/62 mmHg  Pulse 86  Ht 5' 5.5" (1.664 m)  Wt 189 lb (85.73 kg)  BMI 30.96 kg/m2  Physical Exam:  Well appearing obese, middle-age woman, NAD HEENT: Unremarkable Neck:  7 cm JVD, no thyromegally Lungs:  Clear with no wheezes, rales, or rhonchi. HEART:  Regular rate rhythm, no murmurs, no rubs, no clicks Abd:  soft, positive bowel sounds, no organomegally, no rebound, no guarding Ext:  2 plus pulses, no edema, no cyanosis, no clubbing Skin:  No rashes no nodules Neuro:  CN II through XII intact, motor grossly intact  EKG normal sinus rhythm with nonspecific ST-T wave abnormality.  Assess/Plan:

## 2014-05-30 NOTE — Assessment & Plan Note (Signed)
I have asked her to stop smoking or at least to reduce the amount she smokes.

## 2014-06-15 DIAGNOSIS — J301 Allergic rhinitis due to pollen: Secondary | ICD-10-CM | POA: Diagnosis not present

## 2014-06-15 DIAGNOSIS — J3089 Other allergic rhinitis: Secondary | ICD-10-CM | POA: Diagnosis not present

## 2014-06-22 DIAGNOSIS — J3089 Other allergic rhinitis: Secondary | ICD-10-CM | POA: Diagnosis not present

## 2014-06-25 ENCOUNTER — Other Ambulatory Visit: Payer: Self-pay | Admitting: *Deleted

## 2014-06-25 DIAGNOSIS — G629 Polyneuropathy, unspecified: Secondary | ICD-10-CM

## 2014-06-25 MED ORDER — TRAMADOL HCL 50 MG PO TABS: 50.0000 mg | ORAL_TABLET | Freq: Three times a day (TID) | ORAL | Status: DC | PRN

## 2014-06-25 NOTE — Telephone Encounter (Signed)
Phoned in refill for Tramadol 50mg  take 1 tablet q 8 hr PRN pain (#90, 2 refills) for 3 month supply. Previously had rx #270 for 3 month supply with additional refills. Will re-address duration of therapy at future visit.  Nobie Putnam, [place], PGY-2

## 2014-06-29 DIAGNOSIS — J3089 Other allergic rhinitis: Secondary | ICD-10-CM | POA: Diagnosis not present

## 2014-06-29 DIAGNOSIS — J301 Allergic rhinitis due to pollen: Secondary | ICD-10-CM | POA: Diagnosis not present

## 2014-07-05 DIAGNOSIS — J3089 Other allergic rhinitis: Secondary | ICD-10-CM | POA: Diagnosis not present

## 2014-07-05 DIAGNOSIS — J301 Allergic rhinitis due to pollen: Secondary | ICD-10-CM | POA: Diagnosis not present

## 2014-07-11 DIAGNOSIS — J301 Allergic rhinitis due to pollen: Secondary | ICD-10-CM | POA: Diagnosis not present

## 2014-07-11 DIAGNOSIS — J3089 Other allergic rhinitis: Secondary | ICD-10-CM | POA: Diagnosis not present

## 2014-07-18 ENCOUNTER — Telehealth: Payer: Self-pay | Admitting: *Deleted

## 2014-07-18 NOTE — Telephone Encounter (Signed)
CT department call needing a verbal order to change CT of abdomin to with and without contrast.  They also needed verbal order to draw creatine level if pt's lab work is not recent.  Verbal order given by Dr. Parks Ranger.  Derl Barrow, RN

## 2014-07-19 ENCOUNTER — Telehealth: Payer: Self-pay | Admitting: Family Medicine

## 2014-07-19 DIAGNOSIS — K760 Fatty (change of) liver, not elsewhere classified: Secondary | ICD-10-CM

## 2014-07-19 NOTE — Addendum Note (Signed)
Addended by: Levert Feinstein F on: 07/19/2014 01:45 PM   Modules accepted: Orders

## 2014-07-19 NOTE — Telephone Encounter (Signed)
Reviewed chart. Called patient back. She is scheduled for CT Abd (IV, PO, and w/o contrast) for further eval possible liver mass on 7/8 at 1000 at [facility].  I advised her that she may take two of her Lorazepam 0.5mg  PO tabs prior to this imaging study. Reassurance provided that this is a CT and should be much quicker and easier than the MRI that we had previously considered. Answered her questions.  Nobie Putnam, [place], PGY-3

## 2014-07-19 NOTE — Telephone Encounter (Signed)
Changed order in epic.

## 2014-07-19 NOTE — Telephone Encounter (Signed)
Received call from [facility], authorization obtained # [id number], expires 09/02/2014. Called radiology and informed the

## 2014-07-19 NOTE — Telephone Encounter (Signed)
Will forward to PCP to advise

## 2014-07-19 NOTE — Telephone Encounter (Signed)
Received notice today that patient's upcoming scheduled CT Abd / Pelvis with IV / PO and without contrast on 7/8 requires Prior Authorization from [employer]. Reviewed patient's case and contacted [employer] to proceed with peer-to-peer discussion to gain expedited approval for this scan to avoid re-scheduling. [employer] agreed to authorize a changed order to CT Abd (no pelvis) IV / PO contrast and without for evaluation of unclear Liver Mass/abnormality on prior non-contrast CT. [employer] will change the case order and call back [facility] to provide the Authorization Number. No further action needed at this time. [facility] nursing staff to await this number. Plan to proceed with CT as planned 07/20/14.  Nobie Putnam, [place], PGY-3

## 2014-07-19 NOTE — Telephone Encounter (Signed)
Pt called because tomorrow 7/8 she is having some testing done at [facility]iology and she would like to know if she can take two of her 0.5 mg Lorazepam before the procedure. Please call and let her know if she doesn't answer leave a voice mail. jw

## 2014-07-20 ENCOUNTER — Encounter (HOSPITAL_COMMUNITY): Payer: Self-pay

## 2014-07-20 ENCOUNTER — Ambulatory Visit (HOSPITAL_COMMUNITY): Payer: Medicare Other

## 2014-07-20 ENCOUNTER — Other Ambulatory Visit: Payer: Self-pay | Admitting: Family Medicine

## 2014-07-20 ENCOUNTER — Ambulatory Visit (HOSPITAL_COMMUNITY)
Admission: RE | Admit: 2014-07-20 | Discharge: 2014-07-20 | Disposition: A | Discharge status: Home / Self Care | Payer: Medicare Other | Source: Ambulatory Visit | Attending: Family Medicine | Admitting: Family Medicine

## 2014-07-20 DIAGNOSIS — M5136 Other intervertebral disc degeneration, lumbar region: Secondary | ICD-10-CM | POA: Diagnosis not present

## 2014-07-20 DIAGNOSIS — M4316 Spondylolisthesis, lumbar region: Secondary | ICD-10-CM | POA: Insufficient documentation

## 2014-07-20 DIAGNOSIS — K760 Fatty (change of) liver, not elsewhere classified: Secondary | ICD-10-CM

## 2014-07-20 DIAGNOSIS — I700 Atherosclerosis of aorta: Secondary | ICD-10-CM | POA: Diagnosis not present

## 2014-07-20 HISTORY — DX: Heart failure, unspecified: I50.9

## 2014-07-20 HISTORY — DX: Unspecified asthma, uncomplicated: J45.909

## 2014-07-20 LAB — POCT I-STAT, CHEM 8
BUN: 10 mg/dL (ref 6–20)
Calcium, Ion: 1.24 mmol/L (ref 1.13–1.30)
Chloride: 97 mmol/L — ABNORMAL LOW (ref 101–111)
Creatinine, Ser: 0.6 mg/dL (ref 0.44–1.00)
GLUCOSE: 156 mg/dL — AB (ref 65–99)
HCT: 47 % — ABNORMAL HIGH (ref 36.0–46.0)
Hemoglobin: 16 g/dL — ABNORMAL HIGH (ref 12.0–15.0)
Potassium: 3.5 mmol/L (ref 3.5–5.1)
Sodium: 141 mmol/L (ref 135–145)
TCO2: 26 mmol/L (ref 0–100)

## 2014-07-20 MED ORDER — IOHEXOL 300 MG/ML  SOLN
100.0000 mL | Freq: Once | INTRAMUSCULAR | Status: AC | PRN
  Administered 2014-07-20: 100 mL via INTRAVENOUS

## 2014-07-23 ENCOUNTER — Telehealth: Payer: Self-pay | Admitting: Family Medicine

## 2014-07-23 NOTE — Telephone Encounter (Signed)
Nothing in epic, Im assuming it is regarding recent CT, will forward to PCP.

## 2014-07-23 NOTE — Telephone Encounter (Signed)
Pt states that she spoke to Dr. Parks Ranger recently but she states she does not understand completely. She would like to speak to Dr. Raliegh Ip or his nurse, if possible.

## 2014-07-23 NOTE — Telephone Encounter (Addendum)
Patient recently had CT Abd w/ contrast completed on 07/20/14 to evaluate potential liver mass identified on prior CT scan w/o contrast back on 05/20/14 per Radiology interpretation. Results below:   07/20/14 CT Abd with IV / PO contrast IMPRESSION: 1. Heterogeneous hepatic steatosis, without liver lesion. 2. Advanced atherosclerosis.  These results are reassuring with likely etiology of the previously poorly described area as now being fatty liver / steatosis and not a liver mass. I attempted to call the patient late on Friday 7/8, but unable to reach her, leaving brief message stating results are essentially reassuring and advised to get back in touch with [place] this week for more details.  I will attempt to call her with more detailed results tomorrow 7/12 or as soon as I can this week. If she calls back and is concerned about results, please provided reassurance. No immediate further treatment, testing, or follow-up is required at this time.  UPDATE 07/24/14  Able to reach patient, discussed above results in detail. No further questions. No other work-up needed.  Nobie Putnam, [place], PGY-3

## 2014-07-25 ENCOUNTER — Other Ambulatory Visit: Payer: Self-pay | Admitting: *Deleted

## 2014-07-25 DIAGNOSIS — G629 Polyneuropathy, unspecified: Secondary | ICD-10-CM

## 2014-07-26 MED ORDER — TRAMADOL HCL 50 MG PO TABS: 50.0000 mg | ORAL_TABLET | Freq: Three times a day (TID) | ORAL | Status: DC | PRN

## 2014-07-26 NOTE — Telephone Encounter (Signed)
Phoned in refill for Tramadol 50mg  take 1 q 8 hr PRN pain #90, 2 refills.  Nobie Putnam, [place], PGY-3

## 2014-08-09 ENCOUNTER — Encounter: Payer: Self-pay | Admitting: Family Medicine

## 2014-08-09 ENCOUNTER — Ambulatory Visit (INDEPENDENT_AMBULATORY_CARE_PROVIDER_SITE_OTHER): Payer: Medicare Other | Admitting: Family Medicine

## 2014-08-09 VITALS — BP 125/69 | HR 94 | Temp 98.3°F | Wt 189.0 lb

## 2014-08-09 DIAGNOSIS — E162 Hypoglycemia, unspecified: Secondary | ICD-10-CM

## 2014-08-09 DIAGNOSIS — E1140 Type 2 diabetes mellitus with diabetic neuropathy, unspecified: Secondary | ICD-10-CM

## 2014-08-09 LAB — GLUCOSE, CAPILLARY: GLUCOSE-CAPILLARY: 246 mg/dL — AB (ref 65–99)

## 2014-08-09 LAB — POCT GLYCOSYLATED HEMOGLOBIN (HGB A1C): Hemoglobin A1C: 8.1

## 2014-08-09 NOTE — Assessment & Plan Note (Signed)
Increased hyperglycemia likely due to noncompliance with low carbohydrate diet; eating watermelon and drinking two 8 ounce glass of cranberry juice daily - Continue glipizide 5 mg daily and metformin 1000 mg twice a day - She is interested in nutritional counseling; she will call to schedule appointment Monday

## 2014-08-09 NOTE — Progress Notes (Signed)
   Subjective:    Patient ID: [name], female    DOB: 1950/11/07, 64 y.o.   MRN: [id number]  Seen for Same day visit for   CC: high blood sugar  She reports having blood sugars higher than normal for the past few weeks. Range 99-300. Some symptoms of low blood sugar when it was 99.  Continues to take metformin and glipizide without missed doses.  Reports drinking more cranberry juice lately; two 8 ounce glasses daily, as well as eating watermelon the summer.  Denies any recent infections or medication changes.   Review of Systems   See HPI for ROS. Objective:  BP 125/69 mmHg  Pulse 94  Temp(Src) 98.3 F (36.8 C) (Oral)  Wt 189 lb (85.73 kg)  General: NAD Cardiac: RRR, normal heart sounds, no murmurs. 2+ radial and PT pulses bilaterally Respiratory: CTAB, normal effort    Assessment & Plan:  See Problem List Documentation

## 2014-08-13 ENCOUNTER — Ambulatory Visit: Payer: Medicare Other | Admitting: Family Medicine

## 2014-08-15 ENCOUNTER — Other Ambulatory Visit: Payer: Self-pay | Admitting: Family Medicine

## 2014-08-15 DIAGNOSIS — F411 Generalized anxiety disorder: Secondary | ICD-10-CM

## 2014-08-15 LAB — HM DIABETES EYE EXAM

## 2014-08-16 NOTE — Telephone Encounter (Signed)
Refilled Lorazepam 0.5mg  BID PRN anxiety #60 +2 refills, phoned in to [facility]. Also refilled flexeril.  Nobie Putnam, [place], PGY-3

## 2014-08-20 ENCOUNTER — Encounter: Payer: Self-pay | Admitting: Family Medicine

## 2014-08-20 ENCOUNTER — Ambulatory Visit (INDEPENDENT_AMBULATORY_CARE_PROVIDER_SITE_OTHER): Payer: Medicare Other | Admitting: Family Medicine

## 2014-08-20 VITALS — Ht 65.5 in | Wt 184.2 lb

## 2014-08-20 DIAGNOSIS — E669 Obesity, unspecified: Secondary | ICD-10-CM | POA: Diagnosis not present

## 2014-08-20 DIAGNOSIS — E1140 Type 2 diabetes mellitus with diabetic neuropathy, unspecified: Secondary | ICD-10-CM

## 2014-08-20 NOTE — Patient Instructions (Addendum)
- CARBOHYDRATE consists of three types:  SUGAR, STARCH, and FIBER.  Sugar and starch both raise your blood glucose. Fiber does not raise blood glucose, and it can even help to slow the rise in blood glucose.   - Cranberryy juice vs. Cocktail:  The best one for you will be the one with the least sugar; HOWEVER:  When you have diabetes, there is NO fruit juice that is good for you.  If you must have fruit juice, the best way to drink it is to mix half & half with seltzer or club soda.   - Sugar alcohols are sweeteners that DO raise blood sugar, but less than regular sugar.  Usually when you see sugar alcohols on the label, you can consider the amount to be about half what is listed, e.g., if it says there are 5 grams of sugar alcohols (xylitol, maltitol, other names ending in "ol"), this means functionally, you are getting the carb equivalent of abotu 2.5 grams.  - Diet for fatty liver: The most important thing you can do is avoid alcohol and too much carbohydrate, especially fructose (high-fructose corn syrup in processed foods, syrups, and fruit juice). - Foods you asked about:  - Banana nutbread: Look at the label:  Does it include high-fructose corn syrup?  You can still occasionally have some nutbread, but I recommend you limit this to once a week at most.   - Lime sherbet:  Again, look at the label, and determine how many grams of total carbohydrate (NOT just sugar) it has.  Treat sherbet as you would most other desserts.  You can have it occasionally, but it's best not to have every day.    - Splenda:  If it seems to raise blood glucose levels, that is probably a good sign to use less of it (or none).  Artificial sweeteners are better than sugar when you have diabetes, BUT they should still be used sparingly.   - "Energy vitamin":  There is no actual energy vitamin, but if you make the dietary changes recommended, you will likely feel more energetic.  To feel your best, you'll probably need to start  getting better quality sleep as well.   - Cinnamon capsules:  These may be helpful to blood glucose control, but are less important than the recommendations for dietary changes below.   - Sleepy after breakfast (grits, 2 eggs, sausage/bacon/ham):  You may do better with a carb food that provides FIBER, such as a high-fiber cereal (raisin bran, All Bran, Frosted Mini Wheats), or you could try adding 1-2 tbsp flax seed meal (ground flax seed) to your cereal or grits.  - Insomnia:  Read the handout provided today.   - Piriformis syndrome:  See a sports medicine doctor ([facility]; [phone] for appt.)  Diet Recommendations for Diabetes   Starchy (carb) foods: Bread, rice, pasta, potatoes, corn, cereal, grits, crackers, bagels, muffins, all baked goods.  (Fruits, milk, and yogurt also have carbohydrate, but most of these foods will not spike your blood sugar as the starchy foods will.)  A few fruits do cause high blood sugars; use small portions of bananas (limit to 1/2 at a time), grapes, watermelon, oranges, and most tropical fruits.    Protein foods: Meat, fish, poultry, eggs, dairy foods, and beans such as pinto and kidney beans (beans also provide carbohydrate).  [Baked beans are VERY high in sugar, so take only small portions, and count as a carb food.]  1. Eat at  least 3 meals and 1-2 snacks per day. Never go more than 4-5 hours while awake without eating. Eat breakfast within the first hour of getting up.   2. Limit starchy foods to TWO per meal and ONE per snack. ONE portion of a starchy  food is equal to the following:   - ONE slice of bread (or its equivalent, such as half of a hamburger bun).   - 1/2 cup of a "scoopable" starchy food such as potatoes or rice.   - 15 grams of carbohydrate as shown on food label.  3. Include at every meal: a protein food, a carb food, and vegetables and/or fruit.   - Obtain twice the volume of veg's as protein or carbohydrate foods for  both lunch and dinner.   - Fresh or frozen veg's are best.   - Keep frozen veg's on hand for a quick vegetable serving.    4. Do at least 15 minutes some kind of physical activity at least 6 days a week.    - Desirable fasting blood glucose: 100-130.    - PLEASE SCHEDULE YOUR FOLLOW-UP (60 MIN) FOR SEPT 26 AT 1:30.

## 2014-08-20 NOTE — Progress Notes (Signed)
Medical Nutrition Therapy:  Appt start time: 1430 end time:  8242.  Assessment:  Primary concerns today: Weight management and Blood sugar control.   [name] came with several specific Qs, which I responded to (see patient instrxns).   Learning Readiness: Ready  Barriers to learning/adherence to lifestyle change: High stress related to husband having dementia as well as poor sleep and quantity.    Usual eating pattern includes 2-3 meals and 2 snacks per day. Frequent foods and beverages include coffee w/ Splenda, soda 1-2 X wk, chx, fried fish, roast bf, [place].  Avoided foods include liver, non-fried fish or seafood.   Usual physical activity includes none currently.  FBG (4:30 AM) has been running 149-180.  Usually goes back to bed ~8 AM, after driving husband to work at ~5 AM.  Bedtime BG have been 180-210.  [name] usually has a bedtime snack if her BG is <150, worried that she will drop too low overnight.   Usually going to bed ~10 PM, but often not falling asleep till ~midnight or later.    24-hr recall: (Up at 8:45 AM) B (9 AM)-   2 c coffee, Splenda B (11 AM)-  2 oz country ham, 1 c grits, 1 tbsp chs, 2 eggs, 1 slc toast, 1/4 tsp apple jelly  Snk ( AM)-   water L (1:30 PM)-  1 slc poundcake, 1 c cranberry cocktail  Snk ( PM)-  water D (5 PM)-  2 chx drumettes, grn beans, corn Snk (10 PM)-  1 1/2 c corn flakes, 4 slcs canned peaches, 1/2 c fat-fr milk Typical day? Yes.  except often has 3 meals a day.    Progress Towards Goal(s):  In progress.   Nutritional Diagnosis:  NI-5.8.2 Excessive carbohydrate intake As related to both meals and beverages.  As evidenced by frequent intake of fruit juice as well as excessive carb portions at meals.    Intervention:  Nutrition education.  Handouts given during visit include:  AVS  Sleep handout  Demonstrated degree of understanding via:  Teach Back   Monitoring/Evaluation:  Dietary intake, exercise, BG, and body weight in 7  week(s).

## 2014-09-12 ENCOUNTER — Encounter: Payer: Self-pay | Admitting: Family Medicine

## 2014-09-12 ENCOUNTER — Ambulatory Visit (INDEPENDENT_AMBULATORY_CARE_PROVIDER_SITE_OTHER): Payer: Medicare Other | Admitting: Family Medicine

## 2014-09-12 VITALS — BP 134/72 | HR 86 | Temp 98.5°F | Ht 65.5 in | Wt 184.6 lb

## 2014-09-12 DIAGNOSIS — K760 Fatty (change of) liver, not elsewhere classified: Secondary | ICD-10-CM | POA: Diagnosis not present

## 2014-09-12 DIAGNOSIS — E1140 Type 2 diabetes mellitus with diabetic neuropathy, unspecified: Secondary | ICD-10-CM

## 2014-09-12 NOTE — Assessment & Plan Note (Signed)
Confirmed dx with Fatty Liver disease on Abd CT 7/8 (IV/PO contrast), ruled out liver mass or lesion. Currently asymptomatic, no complications. - Last CMET with LFTs 05/2013, normal range  Plan: 1. Improve control of Diabetes, Wt management, no further interventions at this time 2. Re-check CMET in 2017 for monitoring LFTs, consider future RUQ [place] for change

## 2014-09-12 NOTE — Assessment & Plan Note (Signed)
Recent worsening control, but seems to be improving now, established with Dr. Jenne Campus nutrition 08/2014. Last A1c 8.1 (7/28, up from 5.6 in 04/9673) Complications - peripheral neuropathy - Completed Ophtho and Foot exam 2016  Plan:  1. Continue current therapy - Glipizide 5mg  daily, Metformin 1000mg  BID 2. Lifestyle Mods - Improved DM diet (dec carbs, inc vegs), continue f/u with Dr. Jenne Campus, exercise 3. RTC 2 months, end October for repeat A1c

## 2014-09-12 NOTE — Progress Notes (Signed)
Subjective:    Patient ID: [name], female    DOB: Sep 25, 1950, 64 y.o.   MRN: [id number]  [name] is a 64 y.o. female presenting on 09/12/2014 for Follow-up   HPI   FOLLOW-UP CT RESULTS / BLOOD WORK: - Today wanted to review CT results (7/8 CT Abd/Pelvis with IV/PO contrast, confirmed heterogeneous hepatic steatosis without liver mass or lesion) and discuss, diagnosis of Fatty Liver disease. Asking about future testing and imaging. No blood tests due at this time. Discussed likely surveillance with repeat CMET next year and consider Ultrasound in future.  CHRONIC DM, Type 2: Reports, recently seen 7/28 for hyperglycemia, but thinks she is doing better now with diet., last A1c 7/28 elevated up to 8.1 (from 7.6 in 05/2014). Saw dietitian with Dr. Jenne Campus on 08/20/14 and trying to improve on what she learned, will follow-up with Dr. Jenne Campus Meds: Glipizide 5mg  daily, Metformin 1000mg  BID Reports good compliance. Tolerating well w/o side-effects Currently on ARB Denies hypoglycemia, polyuria, visual changes, numbness or tingling.  History of chronic pain, bilateral piriformis syndrome - Continues to take flexeril, overall doing very well on this, mostly helps sleep at night  Past Medical History  Diagnosis Date  . Anxiety   . Cataract   . DM2 (diabetes mellitus, type 2)   . Hyperlipidemia   . Hypertension     essential, benign   . Skin lesion   . Insomnia   . Weakness   . Screening for malignant neoplasm of the cervix   . Tobacco abuse   . Palpitations     hx  . Routine general medical examination at a health care facility   . Dyslipidemia   . Disturbance of skin sensation     facial paresthesia; left  . Menopausal syndrome   . GERD (gastroesophageal reflux disease)   . AR (allergic rhinitis)   . Colon polyps     hyperplastic  . Hemorrhoids   . SVT (supraventricular tachycardia)   . Allergy   . Asthma   . CHF (congestive heart failure)     Social History     Social History  . Marital Status: Married    Spouse Name: N/A  . Number of Children: 3  . Years of Education: 13   Occupational History  . Retired    Social History Main Topics  . Smoking status: Current Every Day Smoker -- 1.00 packs/day    Types: Cigarettes  . Smokeless tobacco: Never Used  . Alcohol Use: 7.0 oz/week    14 Standard drinks or equivalent per week     Comment: beer every 3-4 months   . Drug Use: No  . Sexual Activity: Not on file   Other Topics Concern  . Not on file   Social History Narrative   Married, does not get regular exercise. On disability.    2 miscarriages, one at 4 and one at 5 months; death at 1 day, unsure of cause.       Health Care POA:    Emergency Contact: Jeanene Erb (friend) [phone] Terri Piedra (friend's daughter) [phone]   End of Life Plan: Gave pt pamplet   Who lives with you: husband   Any pets: 0   Diet: pt has varied diet   Exercise: pt is disabled and does not have a regular exercise routine   Seatbelts: Pt reports wearing seatbelt when in vehicle   Sun Exposure/Protection: Pt reports wearing a hat, sunglasses and facial sunscreen when outside  Hobbies: shopping and traveling    Current Outpatient Prescriptions on File Prior to Visit  Medication Sig  . acetaminophen (TYLENOL) 325 MG tablet Take 650 mg by mouth every 6 (six) hours as needed for moderate pain.  Marland Kitchen aspirin 81 MG chewable tablet Chew 1 tablet (81 mg total) by mouth daily.  Marland Kitchen atenolol (TENORMIN) 25 MG tablet Take 1 tablet (25 mg total) by mouth daily.  Marland Kitchen atorvastatin (LIPITOR) 40 MG tablet Take 1 tablet (40 mg total) by mouth daily.  . cyclobenzaprine (FLEXERIL) 10 MG tablet TAKE ONE-HALF TO ONE TABLET BY MOUTH THREE TIMES DAILY AS NEEDED FOR MUSCLE SPASMS.  Marland Kitchen gabapentin (NEURONTIN) 300 MG capsule Take 2-3 capsules (600-900 mg total) by mouth 2 (two) times daily as needed.  Marland Kitchen glipiZIDE (GLUCOTROL) 5 MG tablet Take 1 tablet (5 mg total) by mouth  daily.  . hydrochlorothiazide (MICROZIDE) 12.5 MG capsule Take 1 capsule (12.5 mg total) by mouth daily.  Marland Kitchen HYDROcodone-acetaminophen (NORCO/VICODIN) 5-325 MG per tablet Take 1-2 tablets by mouth every 6 (six) hours as needed for moderate pain. (Patient not taking: Reported on 08/20/2014)  . Lancets (ONETOUCH ULTRASOFT) lancets Check blood sugar 3 times daily. ICD-10 CODE E11.40  . levalbuterol (XOPENEX HFA) 45 MCG/ACT inhaler Inhale 2 puffs into the lungs every 4 (four) hours as needed for wheezing or shortness of breath. Dr. Harold Hedge, Allergy and Immunology  . LORazepam (ATIVAN) 0.5 MG tablet TAKE ONE TABLET BY MOUTH TWICE DAILY AS NEEDED FOR ANXIETY  . losartan (COZAAR) 50 MG tablet Take 1 tablet (50 mg total) by mouth daily.  . meclizine (ANTIVERT) 25 MG tablet Take 1-2 tablets (25-50 mg total) by mouth 2 (two) times daily as needed for dizziness. if having symptoms.  May repeat in 12 hours later. (Patient not taking: Reported on 08/20/2014)  . metFORMIN (GLUCOPHAGE) 1000 MG tablet Take 1 tablet (1,000 mg total) by mouth 2 (two) times daily with a meal.  . mometasone (NASONEX) 50 MCG/ACT nasal spray Place 2 sprays into the nose daily.  . Olopatadine HCl (PATANASE) 0.6 % SOLN 1 drop (1 puff total) by Other route every morning.  . ONE TOUCH ULTRA TEST test strip   . pantoprazole (PROTONIX) 40 MG tablet Take 1 tablet (40 mg total) by mouth daily.  . polyethylene glycol (MIRALAX / GLYCOLAX) packet Take 17 g by mouth every other day.  . sucralfate (CARAFATE) 1 GM/10ML suspension Take 10 mLs (1 g total) by mouth 4 (four) times daily as needed (reflux).  . traMADol (ULTRAM) 50 MG tablet Take 1 tablet (50 mg total) by mouth every 8 (eight) hours as needed for moderate pain or severe pain.   No current facility-administered medications on file prior to visit.    Review of Systems  Constitutional: Negative for fever, chills, diaphoresis, activity change, appetite change and fatigue.  HENT: Negative for  congestion and hearing loss.   Eyes: Negative for visual disturbance.  Respiratory: Negative for cough, chest tightness, shortness of breath and wheezing.   Cardiovascular: Negative for chest pain, palpitations and leg swelling.  Gastrointestinal: Negative for nausea, vomiting, abdominal pain, diarrhea and constipation.  Genitourinary: Negative for dysuria, frequency and hematuria.  Musculoskeletal: Negative for arthralgias and neck pain.  Skin: Negative for rash.  Neurological: Negative for dizziness, weakness, light-headedness, numbness and headaches.  Hematological: Negative for adenopathy.  Psychiatric/Behavioral: Negative for behavioral problems and confusion.   Per HPI unless specifically indicated above     Objective:    BP 134/72 mmHg  Pulse 86  Temp(Src) 98.5 F (36.9 C) (Oral)  Ht 5' 5.5" (1.664 m)  Wt 184 lb 9.6 oz (83.734 kg)  BMI 30.24 kg/m2  Wt Readings from Last 3 Encounters:  09/12/14 184 lb 9.6 oz (83.734 kg)  08/20/14 184 lb 3.2 oz (83.553 kg)  08/09/14 189 lb (85.73 kg)    Physical Exam  Constitutional: She is oriented to person, place, and time. She appears well-developed and well-nourished. No distress.  HENT:  Head: Normocephalic and atraumatic.  Mouth/Throat: Oropharynx is clear and moist.  Eyes: Conjunctivae and EOM are normal. Pupils are equal, round, and reactive to light.  Neck: Normal range of motion. Neck supple. No thyromegaly present.  Cardiovascular: Normal rate, regular rhythm, normal heart sounds and intact distal pulses.   No murmur heard. Pulmonary/Chest: Effort normal and breath sounds normal. No respiratory distress. She has no wheezes. She has no rales.  Abdominal: Soft. Bowel sounds are normal. She exhibits no distension and no mass. There is no tenderness.  Musculoskeletal: Normal range of motion. She exhibits no edema or tenderness.  Lymphadenopathy:    She has no cervical adenopathy.  Neurological: She is alert and oriented to  person, place, and time.  Skin: Skin is warm and dry. No rash noted. She is not diaphoretic.  Nursing note and vitals reviewed.  Results for orders placed or performed in visit on 08/09/14  Glucose, capillary  Result Value Ref Range   Glucose-Capillary 246 (H) 65 - 99 mg/dL  POCT A1C  Result Value Ref Range   Hemoglobin A1C 8.1       Assessment & Plan:   Problem List Items Addressed This Visit      Digestive   Fatty infiltration of liver    Confirmed dx with Fatty Liver disease on Abd CT 7/8 (IV/PO contrast), ruled out liver mass or lesion. Currently asymptomatic, no complications. - Last CMET with LFTs 05/2013, normal range  Plan: 1. Improve control of Diabetes, Wt management, no further interventions at this time 2. Re-check CMET in 2017 for monitoring LFTs, consider future RUQ [place] for change        Endocrine   Diabetes mellitus with neuropathy - Primary    Recent worsening control, but seems to be improving now, established with Dr. Jenne Campus nutrition 08/2014. Last A1c 8.1 (7/28, up from 5.6 in 07/3417) Complications - peripheral neuropathy - Completed Ophtho and Foot exam 2016  Plan:  1. Continue current therapy - Glipizide 5mg  daily, Metformin 1000mg  BID 2. Lifestyle Mods - Improved DM diet (dec carbs, inc vegs), continue f/u with Dr. Jenne Campus, exercise 3. RTC 2 months, end October for repeat A1c           No orders of the defined types were placed in this encounter.      Follow up plan: Return in about 2 months (around 11/12/2014) for diabetes.  Nobie Putnam, [place], PGY-3

## 2014-09-12 NOTE — Patient Instructions (Signed)
Dear [name], Thank you for coming in to clinic today. It was good to see you!  1. Overall you are doing well. Congratulations on your weight loss. Keep up the good work with your diet and exercise. 2. No further testing today. We will check blood work for liver / cholesterol next year. 3. Return end of October for 3 month A1c (last done 08/09/14)  Please schedule a follow-up appointment with Dr. Parks Ranger end of October for A1c DM follow-up  Follow-up with Dr. Jenne Campus as scheduled.  If you have any other questions or concerns, please feel free to call the clinic to contact me. You may also schedule an earlier appointment if necessary.  However, if your symptoms get significantly worse, please go to the Emergency Department to seek immediate medical attention.  Nobie Putnam, [place]

## 2014-09-19 ENCOUNTER — Other Ambulatory Visit: Payer: Self-pay | Admitting: Family Medicine

## 2014-09-19 DIAGNOSIS — E1140 Type 2 diabetes mellitus with diabetic neuropathy, unspecified: Secondary | ICD-10-CM

## 2014-10-08 ENCOUNTER — Ambulatory Visit (INDEPENDENT_AMBULATORY_CARE_PROVIDER_SITE_OTHER): Payer: Medicare Other | Admitting: *Deleted

## 2014-10-08 ENCOUNTER — Ambulatory Visit: Payer: Medicare Other

## 2014-10-08 ENCOUNTER — Ambulatory Visit (INDEPENDENT_AMBULATORY_CARE_PROVIDER_SITE_OTHER): Payer: Medicare Other | Admitting: Family Medicine

## 2014-10-08 ENCOUNTER — Encounter: Payer: Self-pay | Admitting: Family Medicine

## 2014-10-08 VITALS — Ht 65.5 in | Wt 183.5 lb

## 2014-10-08 DIAGNOSIS — E663 Overweight: Secondary | ICD-10-CM | POA: Diagnosis not present

## 2014-10-08 DIAGNOSIS — Z23 Encounter for immunization: Secondary | ICD-10-CM

## 2014-10-08 DIAGNOSIS — E1140 Type 2 diabetes mellitus with diabetic neuropathy, unspecified: Secondary | ICD-10-CM | POA: Diagnosis not present

## 2014-10-08 NOTE — Patient Instructions (Signed)
-From last appt: - CARBOHYDRATE consists of three types: SUGAR, STARCH, and FIBER. Sugar and starch both raise your blood glucose. Fiber does not raise blood glucose, and it can even help to slow the rise in blood glucose.  - Cranberryy juice vs. Cocktail: The best one for you will be the one with the least sugar; HOWEVER: When you have diabetes, there is NO fruit juice that is good for you. If you must have fruit juice, the best way to drink it is to mix half & half with seltzer or club soda.  - Sugar alcohols are sweeteners that DO raise blood sugar, but less than regular sugar. Usually when you see sugar alcohols on the label, you can consider the amount to be about half what is listed, e.g., if it says there are 5 grams of sugar alcohols (xylitol, maltitol, other names ending in "ol"), this means functionally, you are getting the carb equivalent of abotu 2.5 grams.  - Diet for fatty liver: The most important thing you can do is avoid alcohol and too much carbohydrate, especially fructose (high-fructose corn syrup in processed foods, syrups, and fruit juice). - Foods you asked about: - Banana nutbread: Look at the label: Does it include high-fructose corn syrup? You can still occasionally have some nutbread, but I recommend you limit this to once a week at most. - Lime sherbet: Again, look at the label, and determine how many grams of total carbohydrate (NOT just sugar) it has. Treat sherbet as you would most other desserts. You can have it occasionally, but it's best not to have every day.  - Splenda: If it seems to raise blood glucose levels, that is probably a good sign to use less of it (or none). Artificial sweeteners are better than sugar when you have diabetes, BUT they should still be used sparingly.  - "Energy vitamin": There is no actual energy vitamin, but if you make the dietary changes recommended, you will likely feel more energetic.  To feel your best, you'll probably need to start getting better quality sleep as well.  - Cinnamon capsules: These may be helpful to blood glucose control, but are less important than the recommendations for dietary changes below.  - Sleepy after breakfast (grits, 2 eggs, sausage/bacon/ham): You may do better with a carb food that provides FIBER, such as a high-fiber cereal (raisin bran, All Bran, Frosted Mini Wheats), or you could try adding 1-2 tbsp flax seed meal (ground flax seed) to your cereal or grits.  - Insomnia: Read the handout provided today.  - Piriformis syndrome: See a sports medicine doctor ([facility]; [phone] for appt.) __________________________________________________________________________________________  Diet Recommendations for Diabetes   Starchy (carb) foods: Bread, rice, pasta, potatoes, corn, cereal, grits, crackers, bagels, muffins, all baked goods. (Fruits, milk, and yogurt also have carbohydrate, but most of these foods will not spike your blood sugar as the starchy foods will.) A few fruits do cause high blood sugars; use small portions of bananas (limit to 1/2 at a time), grapes, watermelon, oranges, and most tropical fruits.   Protein foods: Meat, fish, poultry, eggs, dairy foods, and beans such as pinto and kidney beans (beans also provide carbohydrate). [Baked beans are VERY high in sugar, so take only small portions, and count as a carb food.]  1. Eat at least 3 meals and 1-2 snacks per day. Never go more than 4-5 hours while awake without eating. Eat breakfast within the first hour of getting up.  2. Limit  starchy foods to TWO per meal and ONE per snack. ONE portion of a starchy food is equal to the following:  - ONE slice of bread (or its equivalent, such as half of a hamburger bun).  - 1/2 cup of a "scoopable" starchy food such as potatoes or rice.  - 15 grams of carbohydrate as  shown on food label.  3. Include at every meal: a protein food, a carb food, and vegetables and/or fruit.  - Obtain twice the volume of veg's as protein or carbohydrate foods for both lunch and dinner.  - Fresh or frozen veg's are best.  - Keep frozen veg's on hand for a quick vegetable serving.  4. Do at least 15 minutes some kind of physical activity at least 6 days a week.   - Desirable fasting blood glucose: 100-130.  - When you look at the Nutrition Facts Panel on any food product, you want to look for TOTAL CARBOHYDRATE, not just sugars!

## 2014-10-08 NOTE — Progress Notes (Signed)
Medical Nutrition Therapy:  Appt start time: 1430 end time:  7106.  Assessment:  Primary concerns today: Weight management and Blood sugar control.   [name] said her FBG have been 140s to 179 at 4:30 AM (then drives husband to work, goes back to bed till ~10 or 11, when she has breakfast).  Also checking at 10 or 11 AM; has been as low as 111-120s.  [name] said she dropped her last appt's AVS in water, so did not have the recommendations to follow.  (I emphasized that it's easy for me to get her another copy if this happens again.)  Clearly, she did not remember much, if any of the recommendations we reviewed at last appt.    We reviewed DM diet principles and recommendations again (see Pt Instrxns) as well as answers to her Qs from last appt.  I emphasized the importance of tighter BG control to help her neuropathy from advancing and to help with her weight, which might give at least some relief to her piriformis syndrome.    24-hr recall:  (Up at 8 AM) Snk (8 AM)-  2 c coffee w/ Splenda B (11 AM)-  2-3 oz country ham, 2/3 c grits, 1 t butter, 2 scrmbld eggs, <1/2 oz cheese Snk ( AM)-  --- L (4 PM)-  3-4 oz [place] stk w/ 2/3 c mashed pot's, 1 t gravy, frozen dinner, water Snk (4 PM)-  --- D (6:30 PM)-  1 1/2 c veg-bean-pasta soup, 6 saltines, water Snk (9 PM)-  6 vanilla wafers (= ~15 g CHO) Typical day? No. Had neuropathy and piriformis pain yesterday.    Progress Towards Goal(s):  In progress.   Nutritional Diagnosis:  Slight progress on NI-5.8.2 Excessive carbohydrate intake As related to both meals and beverages.  As evidenced by reduced intake of fruit juice, but no apparent effort to cut back on carb choices.    Intervention:  Nutrition education.  Handouts given during visit include:  AVS  Sleep handout  Demonstrated degree of understanding via:  Teach Back   Monitoring/Evaluation:  Dietary intake, exercise, BG, and body weight in 5 week(s).

## 2014-10-18 ENCOUNTER — Other Ambulatory Visit: Payer: Self-pay

## 2014-11-06 ENCOUNTER — Encounter: Payer: Self-pay | Admitting: Family Medicine

## 2014-11-07 ENCOUNTER — Ambulatory Visit (INDEPENDENT_AMBULATORY_CARE_PROVIDER_SITE_OTHER): Payer: Medicare Other | Admitting: Family Medicine

## 2014-11-07 ENCOUNTER — Encounter: Payer: Self-pay | Admitting: Family Medicine

## 2014-11-07 VITALS — BP 128/69 | HR 79 | Temp 98.7°F | Ht 65.5 in | Wt 185.5 lb

## 2014-11-07 DIAGNOSIS — K219 Gastro-esophageal reflux disease without esophagitis: Secondary | ICD-10-CM

## 2014-11-07 DIAGNOSIS — G6289 Other specified polyneuropathies: Secondary | ICD-10-CM

## 2014-11-07 DIAGNOSIS — E1140 Type 2 diabetes mellitus with diabetic neuropathy, unspecified: Secondary | ICD-10-CM | POA: Diagnosis not present

## 2014-11-07 DIAGNOSIS — I10 Essential (primary) hypertension: Secondary | ICD-10-CM | POA: Diagnosis not present

## 2014-11-07 DIAGNOSIS — M545 Low back pain, unspecified: Secondary | ICD-10-CM

## 2014-11-07 DIAGNOSIS — F411 Generalized anxiety disorder: Secondary | ICD-10-CM

## 2014-11-07 DIAGNOSIS — L609 Nail disorder, unspecified: Secondary | ICD-10-CM

## 2014-11-07 DIAGNOSIS — G8929 Other chronic pain: Secondary | ICD-10-CM

## 2014-11-07 DIAGNOSIS — M62830 Muscle spasm of back: Secondary | ICD-10-CM

## 2014-11-07 LAB — POCT GLYCOSYLATED HEMOGLOBIN (HGB A1C): Hemoglobin A1C: 8.3

## 2014-11-07 MED ORDER — BACLOFEN 10 MG PO TABS: 10.0000 mg | ORAL_TABLET | Freq: Three times a day (TID) | ORAL | Status: DC | PRN

## 2014-11-07 MED ORDER — LOSARTAN POTASSIUM 50 MG PO TABS: 50.0000 mg | ORAL_TABLET | Freq: Every day | ORAL | Status: DC

## 2014-11-07 MED ORDER — SUCRALFATE 1 GM/10ML PO SUSP: 1.0000 g | Freq: Four times a day (QID) | ORAL | Status: DC | PRN

## 2014-11-07 MED ORDER — LORAZEPAM 0.5 MG PO TABS: 0.5000 mg | ORAL_TABLET | Freq: Two times a day (BID) | ORAL | Status: DC | PRN

## 2014-11-07 MED ORDER — METFORMIN HCL 1000 MG PO TABS: 1000.0000 mg | ORAL_TABLET | Freq: Two times a day (BID) | ORAL | Status: DC

## 2014-11-07 MED ORDER — PANTOPRAZOLE SODIUM 40 MG PO TBEC: 40.0000 mg | DELAYED_RELEASE_TABLET | Freq: Every day | ORAL | Status: DC

## 2014-11-07 MED ORDER — GABAPENTIN 300 MG PO CAPS: 600.0000 mg | ORAL_CAPSULE | Freq: Two times a day (BID) | ORAL | Status: DC | PRN

## 2014-11-07 MED ORDER — HYDROCHLOROTHIAZIDE 12.5 MG PO CAPS: 12.5000 mg | ORAL_CAPSULE | Freq: Every day | ORAL | Status: DC

## 2014-11-07 MED ORDER — TRAMADOL HCL 50 MG PO TABS: 50.0000 mg | ORAL_TABLET | Freq: Three times a day (TID) | ORAL | Status: DC | PRN

## 2014-11-07 MED ORDER — GLIPIZIDE 10 MG PO TABS: 10.0000 mg | ORAL_TABLET | Freq: Every day | ORAL | Status: DC

## 2014-11-07 MED ORDER — ATORVASTATIN CALCIUM 40 MG PO TABS: 40.0000 mg | ORAL_TABLET | Freq: Every day | ORAL | Status: DC

## 2014-11-07 NOTE — Patient Instructions (Signed)
Dear [name], Thank you for coming in to clinic today. It was good to see you!  1. Overall you are doing well. Keep up the good work with your diet and exercise, try to start more regular walking for exercise to avoid other diabetes medicines - Increased Glipizide from 5 to 10 mg daily 2. No further testing today. We will check blood work for liver / cholesterol next year.  Please schedule a follow-up appointment with Dr. Parks Ranger in 3 months for Diabetes  Follow-up with Dr. Jenne Campus as scheduled.  If you have any other questions or concerns, please feel free to call the clinic to contact me. You may also schedule an earlier appointment if necessary.  However, if your symptoms get significantly worse, please go to the Emergency Department to seek immediate medical attention.  [name], [name]

## 2014-11-07 NOTE — Progress Notes (Signed)
Subjective:    Patient ID: [name], female    DOB: [birth year], 64 y.o.   MRN: [id number]  [name] is a 64 y.o. female presenting on 11/07/2014 for Follow-up  Requesting all medication refills today, requested to be printed  HPI  CHRONIC DM, Type 2: Reports that she met with Dr. Jenne Campus on 10/08/14, sometimes she has days when she is not hungry for certain meals, occasionally can go up to 4-5 hours without getting hungry, will skip snacks. Also describes she gets up early at 4:00am, will eat and then go back to sleep (takes her husband to work early, he has early Alzheimer's) CBGs: States her blood sugar readings are variable but does not have a log. Avg 130, Low 100, High 277. Checks CBGs 3x daily Meds: Glipizide 12m daily, Metformin 1001mBID Reports good compliance. Tolerating well w/o side-effects Currently on ARB Lifestyle: Diet (see above) / Exercise (not regularly, some walking tries to avoid staying stiff) - Reports Denies hypoglycemia, polyuria, visual changes, numbness or tingling.  ABNORMAL FINGERNAIL: - Followed by Dermatology at Dr. [facility]edell [facility] - Concerned with some fingernails growing longer and "raised", without redness, pain, swelling, or prior injury - Also follows with Podiatry previously Dr. SiBlenda [place] nail clipping  LOW BACK PAIN / MUSCLE SPASMS: - Chronic known low back pain seems to be bilateral lumbar with previous episodes of Right vs Left sided pain. Previously diagnosed with MSK muscle pain. Improved on Tramadol and Tylenol AS NEEDED, also with Flexeril.  CHRONIC HTN: Reports no concerns today. Checks occasionally outside office, doing well Current Meds - atenolol, hctz, losartan   Reports good compliance, took meds today. Tolerating well, w/o complaints. Denies CP, dyspnea, HA, edema, dizziness / lightheadedness  ANXIETY: - Controlled on Ativan - Significant chronic stressors in life with husband having early dementia, she drives him  to work daily  Past Medical History  Diagnosis Date  . Anxiety   . Cataract   . DM2 (diabetes mellitus, type 2) [place]  . Hyperlipidemia   . Hypertension     essential, benign   . Skin lesion   . Insomnia   . Weakness   . Screening for malignant neoplasm of the cervix   . Tobacco abuse   . Palpitations     hx  . Routine general medical examination at a health care facility   . Dyslipidemia   . Disturbance of skin sensation     facial paresthesia; left  . Menopausal syndrome   . GERD (gastroesophageal reflux disease)   . AR (allergic rhinitis)   . Colon polyps     hyperplastic  . Hemorrhoids   . SVT (supraventricular tachycardia) ([facility]  . Allergy   . Asthma   . CHF (congestive heart failure) ([facility]    Social History   Social History  . Marital Status: Married    Spouse Name: N/A  . Number of Children: 3  . Years of Education: 13   Occupational History  . Retired    Social History Main Topics  . Smoking status: Current Every Day Smoker -- 1.00 packs/day    Types: Cigarettes  . Smokeless tobacco: Never Used  . Alcohol Use: 7.0 oz/week    14 Standard drinks or equivalent per week     Comment: beer every 3-4 months   . Drug Use: No  . Sexual Activity: Not on file   Other Topics Concern  . Not on file   Social History  Narrative   Married, does not get regular exercise. On disability.    2 miscarriages, one at 4 and one at 5 months; death at 1 day, unsure of cause.       Health Care POA:    Emergency Contact: Jeanene Erb (friend) [phone] Terri Piedra (friend's daughter) [phone]   End of Life Plan: Gave pt pamplet   Who lives with you: husband   Any pets: 0   Diet: pt has varied diet   Exercise: pt is disabled and does not have a regular exercise routine   Seatbelts: Pt reports wearing seatbelt when in vehicle   Sun Exposure/Protection: Pt reports wearing a hat, sunglasses and facial sunscreen when outside   Hobbies: shopping and traveling     Current Outpatient Prescriptions on File Prior to Visit  Medication Sig  . acetaminophen (TYLENOL) 325 MG tablet Take 650 mg by mouth every 6 (six) hours as needed for moderate pain.  Marland Kitchen aspirin 81 MG chewable tablet Chew 1 tablet (81 mg total) by mouth daily.  Marland Kitchen atenolol (TENORMIN) 25 MG tablet Take 1 tablet (25 mg total) by mouth daily.  . cyclobenzaprine (FLEXERIL) 10 MG tablet TAKE ONE-HALF TO ONE TABLET BY MOUTH THREE TIMES DAILY AS NEEDED FOR MUSCLE SPASMS.  . Lancets (ONETOUCH ULTRASOFT) lancets CHECK BLOOD SUGAR 3 TIMES DAILY. ICD-10 CODE E11.40  . levalbuterol (XOPENEX HFA) 45 MCG/ACT inhaler Inhale 2 puffs into the lungs every 4 (four) hours as needed for wheezing or shortness of breath. Dr. Harold Hedge, Allergy and Immunology  . meclizine (ANTIVERT) 25 MG tablet Take 1-2 tablets (25-50 mg total) by mouth 2 (two) times daily as needed for dizziness. if having symptoms.  May repeat in 12 hours later.  . mometasone (NASONEX) 50 MCG/ACT nasal spray Place 2 sprays into the nose daily.  . Olopatadine HCl (PATANASE) 0.6 % SOLN 1 drop (1 puff total) by Other route every morning.  . ONE TOUCH ULTRA TEST test strip   . polyethylene glycol (MIRALAX / GLYCOLAX) packet Take 17 g by mouth every other day.   No current facility-administered medications on file prior to visit.    Review of Systems  Constitutional: Negative for fever, chills, diaphoresis, activity change, appetite change and fatigue.  Eyes: Negative for visual disturbance.  Cardiovascular: Negative for chest pain and palpitations.  Gastrointestinal: Negative for nausea, vomiting, abdominal pain, diarrhea and constipation.  Neurological: Negative for dizziness, weakness, light-headedness, numbness and headaches.   Per HPI unless specifically indicated above     Objective:    BP 128/69 mmHg  Pulse 79  Temp(Src) 98.7 F (37.1 C) (Oral)  Ht 5' 5.5" (1.664 m)  Wt 185 lb 8 oz (84.142 kg)  BMI 30.39 kg/m2  Wt Readings from  Last 3 Encounters:  11/07/14 185 lb 8 oz (84.142 kg)  10/08/14 183 lb 8 oz (83.235 kg)  09/12/14 184 lb 9.6 oz (83.734 kg)    Physical Exam  Constitutional: She is oriented to person, place, and time. She appears well-developed and well-nourished. No distress.  HENT:  Head: Normocephalic and atraumatic.  Mouth/Throat: Oropharynx is clear and moist.  Neck: Normal range of motion. Neck supple. No thyromegaly present.  Cardiovascular: Normal rate, regular rhythm, normal heart sounds and intact distal pulses.   No murmur heard. Pulmonary/Chest: Effort normal and breath sounds normal.  Musculoskeletal: Normal range of motion. She exhibits no edema.       Lumbar back: She exhibits tenderness (mild +TTP with paraspinal L>R muscle spasm) and  spasm. She exhibits normal range of motion, no bony tenderness, no swelling and no deformity.  Lymphadenopathy:    She has no cervical adenopathy.  Neurological: She is alert and oriented to person, place, and time.  Skin: Skin is warm and dry. No rash noted. She is not diaphoretic.  Difficulty to appreciate her concerns with fingernails, left 5th digit with long nail, without thickening pitting or abnormality  Psychiatric: She has a normal mood and affect. Her behavior is normal.  Not anxious  Nursing note and vitals reviewed.  Results for orders placed or performed in visit on 11/07/14  HgB A1c  Result Value Ref Range   Hemoglobin A1C 8.3       Assessment & Plan:   Problem List Items Addressed This Visit      Cardiovascular and Mediastinum   Essential hypertension, benign    Well-controlled HTN No complications   Plan:  1. Continue current BP meds.  2. Check labs in Spring 3. Lifestyle Mods - Smoking cessation, Exercise, Dec salt intake, inc K+ rich vegs 4. Monitor BP at home or at drug store occasionally.       Relevant Medications   losartan (COZAAR) 50 MG tablet   hydrochlorothiazide (MICROZIDE) 12.5 MG capsule   atorvastatin  (LIPITOR) 40 MG tablet     Digestive   GERD   Relevant Medications   sucralfate (CARAFATE) 1 GM/10ML suspension   pantoprazole (PROTONIX) 40 MG tablet     Endocrine   Diabetes mellitus with neuropathy ([place]) - Primary    Stable control without improvement Current HgbA1c 8.3 (10/2014), last HgbA1c 8.1 (07/2014). No hypoglycemia. Complications - peripheral neuropathy  Plan:  1. Increase Glipizide from 56m to 115mdaily - can split dose in future if needed 2. Lifestyle Mods - Emphasized importance of improved DM diet (dec carbs, inc vegs), exercise to reduce further medications and possibly future insulin 3. Continue f/u with Dr. SyJenne Campusor nutrition 4. RTC 3 mo A1c        Relevant Medications   metFORMIN (GLUCOPHAGE) 1000 MG tablet   losartan (COZAAR) 50 MG tablet   glipiZIDE (GLUCOTROL) 10 MG tablet   gabapentin (NEURONTIN) 300 MG capsule   atorvastatin (LIPITOR) 40 MG tablet   Other Relevant Orders   HgB A1c (Completed)     Nervous and Auditory   Peripheral neuropathy ([facility])    Stable on Gabapentin, Tramadol - refilled today      Relevant Medications   traMADol (ULTRAM) 50 MG tablet   gabapentin (NEURONTIN) 300 MG capsule   LORazepam (ATIVAN) 0.5 MG tablet   baclofen (LIORESAL) 10 MG tablet     Other   Anxiety state    Stable on Ativan, taking daily.  Plan: 1. Refilled Ativan today      Relevant Medications   LORazepam (ATIVAN) 0.5 MG tablet   Chronic low back pain    Stable, now with some left > right lumbar paraspinal muscle tenderness and spasm. Consistent with chronic MSK injury, likely underlying OA - No recent injury - Controlled on Tramadol, Tylenol  Plan: 1. Refilled Tramadol, Flexeril today 2. Continue ice / heat AS NEEDED 3. Future consider lumbar X-rays for further eval of potential OA, may benefit from PT      Relevant Medications   traMADol (ULTRAM) 50 MG tablet   baclofen (LIORESAL) 10 MG tablet    Other Visit Diagnoses    Muscle spasm  of back        Relevant Medications  baclofen (LIORESAL) 10 MG tablet    Fingernail abnormalities        No obvious pathology. Not consistent with onychomycosis. No infection. Follow with Dermatology and ?podiatry for nail.       Meds ordered this encounter  Medications  . sucralfate (CARAFATE) 1 GM/10ML suspension    Sig: Take 10 mLs (1 g total) by mouth 4 (four) times daily as needed (reflux).    Dispense:  420 mL    Refill:  0  . traMADol (ULTRAM) 50 MG tablet    Sig: Take 1 tablet (50 mg total) by mouth every 8 (eight) hours as needed for moderate pain or severe pain.    Dispense:  90 tablet    Refill:  2  . pantoprazole (PROTONIX) 40 MG tablet    Sig: Take 1 tablet (40 mg total) by mouth daily.    Dispense:  90 tablet    Refill:  4  . metFORMIN (GLUCOPHAGE) 1000 MG tablet    Sig: Take 1 tablet (1,000 mg total) by mouth 2 (two) times daily with a meal.    Dispense:  180 tablet    Refill:  4  . losartan (COZAAR) 50 MG tablet    Sig: Take 1 tablet (50 mg total) by mouth daily.    Dispense:  90 tablet    Refill:  4  . hydrochlorothiazide (MICROZIDE) 12.5 MG capsule    Sig: Take 1 capsule (12.5 mg total) by mouth daily.    Dispense:  90 capsule    Refill:  4  . glipiZIDE (GLUCOTROL) 10 MG tablet    Sig: Take 1 tablet (10 mg total) by mouth daily.    Dispense:  90 tablet    Refill:  4  . gabapentin (NEURONTIN) 300 MG capsule    Sig: Take 2-3 capsules (600-900 mg total) by mouth 2 (two) times daily as needed.    Dispense:  540 capsule    Refill:  4  . atorvastatin (LIPITOR) 40 MG tablet    Sig: Take 1 tablet (40 mg total) by mouth daily.    Dispense:  90 tablet    Refill:  4  . LORazepam (ATIVAN) 0.5 MG tablet    Sig: Take 1 tablet (0.5 mg total) by mouth 2 (two) times daily as needed. for anxiety    Dispense:  60 tablet    Refill:  2  . DISCONTD: baclofen (LIORESAL) 10 MG tablet    Sig: Take 1 tablet (10 mg total) by mouth 3 (three) times daily as needed for muscle  spasms.    Dispense:  90 each    Refill:  5  . baclofen (LIORESAL) 10 MG tablet    Sig: Take 1 tablet (10 mg total) by mouth 3 (three) times daily as needed for muscle spasms.    Dispense:  90 each    Refill:  5      Follow up plan: Return in about 3 months (around 02/07/2015) for diabetes.  A total of 25 minutes was spent face-to-face with this patient. Over half this time was spent on counseling patient on the diagnosis and different diagnostic and therapeutic options available.  Nobie Putnam, [place], PGY-3

## 2014-11-09 NOTE — Assessment & Plan Note (Signed)
Stable on Gabapentin, Tramadol - refilled today

## 2014-11-09 NOTE — Assessment & Plan Note (Signed)
Stable control without improvement Current HgbA1c 8.3 (10/2014), last HgbA1c 8.1 (07/2014). No hypoglycemia. Complications - peripheral neuropathy  Plan:  1. Increase Glipizide from 5mg  to 10mg  daily - can split dose in future if needed 2. Lifestyle Mods - Emphasized importance of improved DM diet (dec carbs, inc vegs), exercise to reduce further medications and possibly future insulin 3. Continue f/u with Dr. Jenne Campus for nutrition 4. RTC 3 mo A1c

## 2014-11-09 NOTE — Assessment & Plan Note (Signed)
Well-controlled HTN No complications   Plan:  1. Continue current BP meds.  2. Check labs in Spring 3. Lifestyle Mods - Smoking cessation, Exercise, Dec salt intake, inc K+ rich vegs 4. Monitor BP at home or at drug store occasionally.

## 2014-11-09 NOTE — Assessment & Plan Note (Signed)
Stable on Ativan, taking daily.  Plan: 1. Refilled Ativan today

## 2014-11-09 NOTE — Assessment & Plan Note (Signed)
Stable, now with some left > right lumbar paraspinal muscle tenderness and spasm. Consistent with chronic MSK injury, likely underlying OA - No recent injury - Controlled on Tramadol, Tylenol  Plan: 1. Refilled Tramadol, Flexeril today 2. Continue ice / heat AS NEEDED 3. Future consider lumbar X-rays for further eval of potential OA, may benefit from PT

## 2014-11-13 ENCOUNTER — Encounter: Payer: Self-pay | Admitting: Family Medicine

## 2014-11-13 ENCOUNTER — Ambulatory Visit (INDEPENDENT_AMBULATORY_CARE_PROVIDER_SITE_OTHER): Payer: Medicare Other | Admitting: Family Medicine

## 2014-11-13 VITALS — Ht 65.5 in | Wt 184.1 lb

## 2014-11-13 DIAGNOSIS — E663 Overweight: Secondary | ICD-10-CM

## 2014-11-13 DIAGNOSIS — E1040 Type 1 diabetes mellitus with diabetic neuropathy, unspecified: Secondary | ICD-10-CM | POA: Diagnosis not present

## 2014-11-13 NOTE — Progress Notes (Signed)
Medical Nutrition Therapy:  Appt start time: 1330 end time:  1430.  Assessment:  Primary concerns today: Weight management and Blood sugar control.   [name] said her food choices have been pretty good.  (She feels her skin has improved with eating more veg's.)  Her FBG have not changed, however, running 140s-170.  In fact, her recent A1C was up from 8.1 to 8.3.  If her BG is below 200 at night, she has a snack before bed, fearful of dropping too low at night.  No hypoglycemia reported.    Upcoming trip to [facility] for a week will include lots of walking.  Agrees to make an effort to choose lots of veg's, but she knows there will also be some special foods she doesn't often get to eat, so will splurge at least a bit.    We reviewed today a sample eating out situation; [name] made good choices for limiting carb's; understands trading off some carb foods for others that are preferred, to stay within recommended carb range.    24-hr recall:  (Up at 4:30 AM; FBG was 162; 1 c coffee w/ Splenda; drove husband to work; back to bed till 10:30; FBG 142) B (11:15 AM)-  1 c grits, 2 scmbld eggs in marg, 1 hotdog, 1 c coffee, Splenda Snk (2 PM)-  2 c store popcorn, 2 c cranberry juice 1 ham sandwich, 1 tsp mayo, 1 c cranberry juice L (4:30 PM)-  1 ham sandwich, 1 tsp mayo D (7 PM)-  1 ham sandwich, 1 tsp mayo, 1 c cranberry juice 9 PM-   BG was 243 Typical day? Yes.    Progress Towards Goal(s):  In progress.   Nutritional Diagnosis:  Stable progress on NI-5.8.2 Excessive carbohydrate intake As related to both foods and beverages.  As evidenced by limited starchy foods consumed and fruit juice limited to "diet" only (2 g CHO per 8 oz).    Intervention:  Nutrition education.  Handouts given during visit include:  AVS  Goals Sheet  A1C & Estimated Avg BG  BG Record  Demonstrated degree of understanding via:  Teach Back   Monitoring/Evaluation:  Dietary intake, exercise, BG, and body weight prn.  Will  need referral for MNT in 2017 d/t limited annual visits covered by insurance.

## 2014-11-13 NOTE — Patient Instructions (Addendum)
-   Sugar alcohols you see on the label represent a low-calorie sweetener.  On average, sugar alcohols provide about half the calories as sugar, so they will not raise your blood sugar as much as sugar will.   - While you are on vacation, make an effort to get some vegetables with both lunch and dinner.  For days you are eating more on-the-run, bring some veg's and/or fruit with you, for example:  - Vegetables:  Cherry tomatoes, strips of red/yellow/orange/green bell pepper, carrots, or get a salad for lunch.    - Apple, orange, 18 grapes (= 16 g of carb), 29 strawberries (= 15 g of carb), or 3/4 cup of blueberries (= 16 g of carb).  And for days you aren't out and about, you could also have pear or berries.    - TASTE PREFERENCES ARE LEARNED.  This means that it will get easier to choose foods you know are good for you if you are exposed to them enough.    - CHALLENGE:  Over the next few weeks, choose 1 new vegetable per week, and have it at least 3 times that week.    Goals: 1. For each meal, limit your starchy foods to 2 portions (30 grams of carb) per meal.  Remember:  If you eat two carb portions, then there is no carb left for a sweet drink or dessert! 2. Eat vegetables twice a day at least 4 days a week.  3. Some form of physical activity at least 30 min 6 X wk.    Complete and bring your goals sheet at follow-up in February.  Ask Dr. Raliegh Ip for additional referral for nutrition; call for appt:  [phone].

## 2014-11-19 ENCOUNTER — Telehealth: Payer: Self-pay | Admitting: *Deleted

## 2014-11-19 NOTE — Telephone Encounter (Signed)
Received a refill request from [facility] for test strips.  DM standardized form completed for One Touch Ultra Test strips.  Form signed by Dr. Erin Hearing.  Derl Barrow, RN

## 2015-01-09 ENCOUNTER — Encounter: Payer: Self-pay | Admitting: Podiatry

## 2015-01-09 ENCOUNTER — Ambulatory Visit: Payer: Medicare Other | Admitting: Podiatry

## 2015-01-09 ENCOUNTER — Ambulatory Visit (INDEPENDENT_AMBULATORY_CARE_PROVIDER_SITE_OTHER): Payer: Medicare Other | Admitting: Podiatry

## 2015-01-09 DIAGNOSIS — B351 Tinea unguium: Secondary | ICD-10-CM | POA: Diagnosis not present

## 2015-01-09 DIAGNOSIS — M79673 Pain in unspecified foot: Secondary | ICD-10-CM

## 2015-01-09 DIAGNOSIS — E1140 Type 2 diabetes mellitus with diabetic neuropathy, unspecified: Secondary | ICD-10-CM

## 2015-01-09 NOTE — Progress Notes (Signed)
Patient ID: [name], female   DOB: July 05, 1950, 64 y.o.   MRN: [id number] Complaint:  Visit Type: Patient returns to my office for continued preventative foot care services. Complaint: Patient states" my nails have grown long and thick and become painful to walk and wear shoes" Patient has been diagnosed with DM with neuropathy.. The patient presents for preventative foot care services. No changes to ROS  Podiatric Exam: Vascular: dorsalis pedis and posterior tibial pulses are palpable bilateral. Capillary return is immediate. Temperature gradient is WNL. Skin turgor WNL  Sensorium: Diminished  Semmes Weinstein monofilament test. Normal tactile sensation bilaterally. Nail Exam: Pt has thick disfigured discolored nails with subungual debris noted bilateral entire nail hallux through fifth toenails Ulcer Exam: There is no evidence of ulcer or pre-ulcerative changes or infection. Orthopedic Exam: Muscle tone and strength are WNL. No limitations in general ROM. No crepitus or effusions noted. Foot type and digits show no abnormalities. Bony prominences are unremarkable. Skin: No Porokeratosis. No infection or ulcers  Diagnosis:  Onychomycosis, , Pain in right toe, pain in left toes  Treatment & Plan Procedures and Treatment: Consent by patient was obtained for treatment procedures. The patient understood the discussion of treatment and procedures well. All questions were answered thoroughly reviewed. Debridement of mycotic and hypertrophic toenails, 1 through 5 bilateral and clearing of subungual debris. No ulceration, no infection noted.  Return Visit-Office Procedure: Patient instructed to return to the office for a follow up visit 3 months for continued evaluation and treatment.    Gardiner Barefoot DPM

## 2015-01-28 ENCOUNTER — Telehealth: Payer: Self-pay | Admitting: Family Medicine

## 2015-01-28 MED ORDER — MECLIZINE HCL 25 MG PO TABS: 25.0000 mg | ORAL_TABLET | Freq: Two times a day (BID) | ORAL | Status: DC | PRN

## 2015-01-28 NOTE — Telephone Encounter (Signed)
Pt called and would like a refill on her Meclizine called in to the [facility] on [address]. jw

## 2015-01-30 ENCOUNTER — Ambulatory Visit: Payer: Medicare Other | Admitting: Family Medicine

## 2015-01-30 ENCOUNTER — Ambulatory Visit (INDEPENDENT_AMBULATORY_CARE_PROVIDER_SITE_OTHER): Payer: Medicare Other | Admitting: Family Medicine

## 2015-01-30 ENCOUNTER — Encounter: Payer: Self-pay | Admitting: Family Medicine

## 2015-01-30 VITALS — BP 117/60 | HR 86 | Temp 97.9°F | Wt 177.0 lb

## 2015-01-30 DIAGNOSIS — G8929 Other chronic pain: Secondary | ICD-10-CM

## 2015-01-30 DIAGNOSIS — F17200 Nicotine dependence, unspecified, uncomplicated: Secondary | ICD-10-CM | POA: Diagnosis not present

## 2015-01-30 DIAGNOSIS — E663 Overweight: Secondary | ICD-10-CM

## 2015-01-30 DIAGNOSIS — L0591 Pilonidal cyst without abscess: Secondary | ICD-10-CM

## 2015-01-30 DIAGNOSIS — B9789 Other viral agents as the cause of diseases classified elsewhere: Secondary | ICD-10-CM

## 2015-01-30 DIAGNOSIS — E1140 Type 2 diabetes mellitus with diabetic neuropathy, unspecified: Secondary | ICD-10-CM

## 2015-01-30 DIAGNOSIS — F411 Generalized anxiety disorder: Secondary | ICD-10-CM | POA: Diagnosis not present

## 2015-01-30 DIAGNOSIS — J069 Acute upper respiratory infection, unspecified: Secondary | ICD-10-CM

## 2015-01-30 DIAGNOSIS — M545 Low back pain: Secondary | ICD-10-CM

## 2015-01-30 DIAGNOSIS — I10 Essential (primary) hypertension: Secondary | ICD-10-CM

## 2015-01-30 DIAGNOSIS — G6289 Other specified polyneuropathies: Secondary | ICD-10-CM

## 2015-01-30 LAB — POCT GLYCOSYLATED HEMOGLOBIN (HGB A1C): HEMOGLOBIN A1C: 8.5

## 2015-01-30 MED ORDER — TRAMADOL HCL 50 MG PO TABS: 50.0000 mg | ORAL_TABLET | Freq: Three times a day (TID) | ORAL | Status: DC | PRN

## 2015-01-30 MED ORDER — LORAZEPAM 0.5 MG PO TABS
0.5000 mg | ORAL_TABLET | Freq: Two times a day (BID) | ORAL | Status: DC | PRN
Start: 2015-01-30 — End: 2015-03-13

## 2015-01-30 NOTE — Assessment & Plan Note (Signed)
Active smoker. No plans to quit. - Counseled on smoking cessation today.

## 2015-01-30 NOTE — Assessment & Plan Note (Signed)
Weight down about 7 lbs in 3 months. Not adhering to exercise or diet plan. - Follow-up Nutrition with Dr Jenne Campus

## 2015-01-30 NOTE — Patient Instructions (Signed)
Dear [name], Thank you for coming in to clinic today. It was good to see you!  1. A1c up from 8.3 to 8.5, this is mostly stable. - Most important is to work on improving Diet. Reduce portion size, carbs, sweets. - Continue meds - Refilled Ativan and Tramadol today  2. No further testing today. We will check blood work for liver / cholesterol in 3 to 6 months.  3. For Cyst on your bottom, this does not appear to be infected right now, try sitz baths and warm compresses, soaks. If this is not improving, getting bigger, worse, red, fevers/chills, drains any pus, please follow-up back here or at your General Surgery office.  Please schedule a follow-up appointment with Dr. Parks Ranger in 3 months for Diabetes  Follow-up with Dr. Jenne Campus whenever you can within next 1-2 months.  If you have any other questions or concerns, please feel free to call the clinic to contact me. You may also schedule an earlier appointment if necessary.  However, if your symptoms get significantly worse, please go to the Emergency Department to seek immediate medical attention.  Nobie Putnam, [place]

## 2015-01-30 NOTE — Assessment & Plan Note (Addendum)
Stable with overall trend of gradual worsening. Secondary to poor dietary choices and limited exercise. Current HgbA1c 8.5 (01/30/15) up from 8.3 (10/2014) and 8.1 before that No hypoglycemia. Complications - peripheral neuropathy DM Foot exam intact to monofilament  Plan:  1. Continue current Glipizide 10mg  daily (without hypoglycemia) 2. Lifestyle Mods - Emphasized importance of improved DM diet (dec carbs, inc vegs), exercise to reduce further medications and possibly future insulin 3. Emphasized importance of following up again with Dr. Jenne Campus for nutrition 4. RTC 3 mo A1c

## 2015-01-30 NOTE — Assessment & Plan Note (Signed)
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 Possible early forming abscess with some firm induration without surrounding cellulitis or any systemic symptoms. No history of recurrent abcesses in this area.  Plan: 1. Recommend Sitz Baths, soaks, warm compresses per handout 2. Return precautions given if worsening - concern for infection, consider future antibiotic course. Advised may need to follow-up with already established General Surgery in future if not resolved, given complicated location.

## 2015-01-30 NOTE — Progress Notes (Signed)
Subjective:    Patient ID: [name], female    DOB: [birth year], 65 y.o.   MRN: [id number]  [name] is a 65 y.o. female presenting on 01/30/2015 for Diabetes  HPI  URI SYMPTOMS / COUGH / DIZZINESS: - Reported that symptoms started on 01/15/15 with URI symptoms and persistent cough. She followed up with her Allergy specialist, and received a depo-medrol steroid injection. - Tried cough syrup at home with some relief - Refilled Meclizine and started taking this for a few days with relief - Admits nasal and chest congestion, associated with back and chest soreness only with coughing - Admits episodes of dizziness without vertigo occuring about 1-2x daily gradually improving since this weekend. None actively. - Denies any fevers/chills, sinus pain pressure or headache, nausea, vomiting  CHRONIC DM, Type 2: - Last visit in 10/2014 CBGs: No detailed CBG readings available today Meds: Glipizide 10 mg daily (increased from 5mg  daily at last visit 10/2014), Metformin 1000mg  BID Reports good compliance. Tolerating well w/o side-effects Currently on ARB Lifestyle: Diet (has not adhered to DM diet, followed by Dr Jenne Campus , last 09/2014, and she plans to follow-up with Dr Jenne Campus for another follow-up) / Exercise (not regularly, some walking tries to avoid staying stiff) - Weight down to 177 lbs from 184 lbs Denies hypoglycemia, polyuria, visual changes, numbness or tingling.  PILONIDAL CYST - Reports new complaint with painful cyst on her "bottom", states just noticed this within past 1 week. Complains of pain, still present worse with direct pressure. No prior similar episodes. No prior antibiotic treatment or other surgical intervention. Denies fevers/chills, drainage of pus, redness, bleeding. No history of hemorrhoids.  LOW BACK PAIN / MUSCLE SPASMS: - Known chronic LBP, bilateral, episodic. Presumed underlying OA. - Today pain is controlled on Tramadol and Flexeril PRN - Denies any  pain radiating down legs, weakness, numbness, tingling, saddle anesthesia, bowel/bladder incontinence or retention  CHRONIC HTN: Reports no concerns today Current Meds - Atenolol, HCTZ, losartan   Reports good compliance, took meds today. Tolerating well, w/o complaints. Denies CP, dyspnea, HA, edema, dizziness / lightheadedness  ANXIETY, CHRONIC: - Today reports no complaints regarding anxiety. Currently controlled on Ativan - Persistent significant chronic stressors in life with husband having early dementia, she continues to be his primary caregiver    Social History   Social History  . Marital Status: Married    Spouse Name: N/A  . Number of Children: 3  . Years of Education: 13   Occupational History  . Retired    Social History Main Topics  . Smoking status: Current Every Day Smoker -- 1.00 packs/day    Types: Cigarettes  . Smokeless tobacco: Never Used  . Alcohol Use: 7.0 oz/week    14 Standard drinks or equivalent per week     Comment: beer every 3-4 months   . Drug Use: No  . Sexual Activity: Not on file   Other Topics Concern  . Not on file   Social History Narrative   Married, does not get regular exercise. On disability.    2 miscarriages, one at 4 and one at 5 months; death at 1 day, unsure of cause.       Health Care POA:    Emergency Contact: Jeanene Erb (friend) [phone] Terri Piedra (friend's daughter) [phone]   End of Life Plan: Gave pt pamplet   Who lives with you: husband   Any pets: 0   Diet: pt has varied diet  Exercise: pt is disabled and does not have a regular exercise routine   Seatbelts: Pt reports wearing seatbelt when in vehicle   Sun Exposure/Protection: Pt reports wearing a hat, sunglasses and facial sunscreen when outside   Hobbies: shopping and traveling   Review of Systems   Per HPI unless specifically indicated above     Objective:    BP 117/60 mmHg  Pulse 86  Temp(Src) 97.9 F (36.6 C) (Oral)  Wt 177 lb  (80.287 kg)  Wt Readings from Last 3 Encounters:  01/30/15 177 lb (80.287 kg)  11/13/14 184 lb 1.6 oz (83.507 kg)  11/07/14 185 lb 8 oz (84.142 kg)    Orthostatic Vitals: Laying 117/69 (HR 89), Sitting 109/61 (HR 86), Standing 94/73 (HR 97) - Asymptomatic during readings  Physical Exam  Constitutional: She appears well-developed and well-nourished. No distress.  HENT:  Head: Normocephalic and atraumatic.  Mouth/Throat: Oropharynx is clear and moist.  Sinsues non-tender to palpation. No nasal purulence.  Eyes: Conjunctivae are normal.  Neck: Normal range of motion. Neck supple. No thyromegaly present.  Cardiovascular: Normal rate, regular rhythm, normal heart sounds and intact distal pulses.   No murmur heard. Pulmonary/Chest: Effort normal and breath sounds normal.  Musculoskeletal: Normal range of motion. She exhibits no edema.       Lumbar back: She exhibits tenderness (improved pain today with mild paraspinal L>R muscle spasm) and spasm. She exhibits normal range of motion, no bony tenderness, no swelling and no deformity.  Seated SLR without radiculopathy  Lymphadenopathy:    She has no cervical adenopathy.  Neurological: She is alert.  Skin: Skin is warm and dry. She is not diaphoretic.  1-2 cm palpable area of firm deep induration deep within gluteal cleft near inferior aspect of anus. Mild tender to palpation, no erythema or fluctuance, ulceration or opening without any drainage.  Psychiatric: She has a normal mood and affect. Her behavior is normal.  Nursing note and vitals reviewed.  Results for orders placed or performed in visit on 01/30/15  POCT HgB A1C (CPT 83036)  Result Value Ref Range   Hemoglobin A1C 8.5       Assessment & Plan:   Problem List Items Addressed This Visit    Anxiety state   Relevant Medications   LORazepam (ATIVAN) 0.5 MG tablet   Chronic low back pain   Relevant Medications   traMADol (ULTRAM) 50 MG tablet   Diabetes mellitus with  neuropathy ([place]) - Primary    Stable with overall trend of gradual worsening. Secondary to poor dietary choices and limited exercise. Current HgbA1c 8.5 (01/30/15) up from 8.3 (10/2014) and 8.1 before that No hypoglycemia. Complications - peripheral neuropathy DM Foot exam intact to monofilament  Plan:  1. Continue current Glipizide 10mg  daily (without hypoglycemia) 2. Lifestyle Mods - Emphasized importance of improved DM diet (dec carbs, inc vegs), exercise to reduce further medications and possibly future insulin 3. Emphasized importance of following up again with Dr. Jenne Campus for nutrition 4. RTC 3 mo A1c      Relevant Orders   POCT HgB A1C (CPT 83036) (Completed)   Essential hypertension, benign    Well-controlled HTN. Orthostatics normal today, with symptoms recently of dizziness, no active symptoms during orthostatic VS No complications   Plan:  1. Continue current BP meds.  2. Check BMET next visit 3. Lifestyle Mods - Smoking cessation, Exercise, Dec salt intake, inc K+ rich vegs 4. Monitor BP at home or at drug store occasionally.  Overweight    Weight down about 7 lbs in 3 months. Not adhering to exercise or diet plan. - Follow-up Nutrition with Dr Jenne Campus      Peripheral neuropathy [facility])   Relevant Medications   LORazepam (ATIVAN) 0.5 MG tablet   traMADol (ULTRAM) 50 MG tablet   Pilonidal cyst    Clinically consistent with midline gluteal cleft vs near anus pilonidal cyst with tenderness but no erythema. Possible early forming abscess with some firm induration without surrounding cellulitis or any systemic symptoms. No history of recurrent abcesses in this area.  Plan: 1. Recommend Sitz Baths, soaks, warm compresses per handout 2. Return precautions given if worsening - concern for infection, consider future antibiotic course. Advised may need to follow-up with already established General Surgery in future if not resolved, given complicated location.      TOBACCO  ABUSE (Chronic)    Active smoker. No plans to quit. - Counseled on smoking cessation today.       Other Visit Diagnoses    Viral URI with cough        Resolving. duration over 2-3 weeks, s/p depo-medrol from allergist. Some dizziness without vertigo. Refilled Meclizine, improve hydration.       Meds ordered this encounter  Medications  . LORazepam (ATIVAN) 0.5 MG tablet    Sig: Take 1 tablet (0.5 mg total) by mouth 2 (two) times daily as needed. for anxiety    Dispense:  60 tablet    Refill:  2  . traMADol (ULTRAM) 50 MG tablet    Sig: Take 1 tablet (50 mg total) by mouth every 8 (eight) hours as needed for moderate pain or severe pain.    Dispense:  90 tablet    Refill:  2    Follow up plan: Return in about 3 months (around 04/30/2015) for diabetes.  Nobie Putnam, [place], PGY-3

## 2015-01-30 NOTE — Assessment & Plan Note (Signed)
Well-controlled HTN. Orthostatics normal today, with symptoms recently of dizziness, no active symptoms during orthostatic VS No complications   Plan:  1. Continue current BP meds.  2. Check BMET next visit 3. Lifestyle Mods - Smoking cessation, Exercise, Dec salt intake, inc K+ rich vegs 4. Monitor BP at home or at drug store occasionally.

## 2015-02-06 ENCOUNTER — Other Ambulatory Visit: Payer: Self-pay | Admitting: Family Medicine

## 2015-02-06 ENCOUNTER — Ambulatory Visit: Payer: Medicare Other | Admitting: Family Medicine

## 2015-02-06 DIAGNOSIS — I10 Essential (primary) hypertension: Secondary | ICD-10-CM

## 2015-03-05 ENCOUNTER — Encounter: Payer: Self-pay | Admitting: Family Medicine

## 2015-03-05 ENCOUNTER — Other Ambulatory Visit: Payer: Self-pay | Admitting: Family Medicine

## 2015-03-05 ENCOUNTER — Ambulatory Visit (INDEPENDENT_AMBULATORY_CARE_PROVIDER_SITE_OTHER): Payer: Medicare Other | Admitting: Family Medicine

## 2015-03-05 VITALS — Ht 65.5 in | Wt 180.3 lb

## 2015-03-05 DIAGNOSIS — E1140 Type 2 diabetes mellitus with diabetic neuropathy, unspecified: Secondary | ICD-10-CM | POA: Diagnosis not present

## 2015-03-05 NOTE — Progress Notes (Signed)
Medical Nutrition Therapy:  Appt start time: 1500 end time:  1600.  Assessment:  Primary concerns today: Weight management and Blood sugar control.   [name] seems to have little or no memory of the behavioral goals set at previous appts, likely attributable at least in part to her current stressful situation:  She has been taking her husband to a lot of appts at the [facility] hospital, who has dementia, and there is a new concern that he has bladder cancer.  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  She rarely gets enough sleep.  Has been suffering with piriformis syndrome, but does not do her prescribed exercises daily.    FBG have been running 130-170s (178 this morning).    24-hr recall suggests intake of ~1430 kcal:  (Up at 4:30 AM; drove husband to work; bk to bed 7-9, but slept an hour) B (10:30 AM)-  3/4 c grits, 2 eggs, chs, sausage patty, coffee, Splenda     390 Snk ( AM)-  --- L (2:30 PM)-  1 Equate protein shake       190      Snk (4 PM)-  3 pork ribs, 1/2 baked pot, 1 1/2 tsp marg, 1 c grn beans, 12 oz swt tea 540+ D (8:45 PM)-  1 1/2 c corn flakes, 1/2 2% milk, 1/2 canned peach, water   310 Snk ( PM)-  --- Typical day? Yes.    Progress Towards Goal(s):  In progress.   Nutritional Diagnosis:  Stable progress on NI-5.8.2 Excessive carbohydrate intake As related to both foods and beverages.  As evidenced by limited starchy foods consumed and fruit juice limited to "diet" only (2 g CHO per 8 oz).    Intervention:  Nutrition education.  Handouts given during visit include:  AVS  Goals Sheet  Demonstrated degree of understanding via:  Teach Back   Monitoring/Evaluation:  Dietary intake, exercise, BG, and body weight prn.

## 2015-03-05 NOTE — Patient Instructions (Addendum)
-   Limit carb portions to two per meal and one per snack:   What equals 1 carb portion:    - 1 slice of bread or its equivalent  - 1/2 cup of a scoopable carb (rice, pasta, potatoes, grits)  - 15 grams of TOTAL CARBOHYDRATE as listed on the label  - Maceo Pro foods will help keep your blood sugar high.  Try to limit intake of fried foods.   - When you are out, and know you will be missing a meal, PLAN AHEAD, and bring some food with you, like a sandwich, yogurt, and piece of fruit.    Goals: 1. For each meal, limit your starchy foods to 2 portions (30 grams of carb) per meal.   2. Eat vegetables twice a day at least 4 days a week.  3. Some form of physical activity at least 15 min 6 X wk.    - Call Dr. Jenne Campus a couple times before your next appt on April 4.  Aim for calling me on around Feb 28 and on Mar 14 to let me know:  Are you checking off your Goals Sheet, and how are you doing meeting your goals?  [id number]

## 2015-03-13 ENCOUNTER — Telehealth: Payer: Self-pay | Admitting: *Deleted

## 2015-03-13 DIAGNOSIS — F411 Generalized anxiety disorder: Secondary | ICD-10-CM

## 2015-03-13 MED ORDER — LORAZEPAM 1 MG PO TABS: 0.5000 mg | ORAL_TABLET | Freq: Two times a day (BID) | ORAL | Status: DC | PRN

## 2015-03-13 NOTE — Telephone Encounter (Signed)
Reviewed PA form, and it appears that prior rx Lorazepam 0.5mg  tabs #60 for BID PRN dosing was exceeding the quantity limit. I do not see any other barriers to rx this medication, she has not failed other medications for anxiety. I contacted her Web designer, discussed with pharmacist and phoned in authorization for changing the rx for Lorazepam 1mg  tabs take 0.5 to 1mg  BID PRN anxiety (#30, +2 refills), and this seems to be approved without PA request.  Called patient, spoke with [name] regarding the above changes to her Lorazepam rx, she confirms never been on any other anxiety medication, and would like to continue Lorazepam. She agrees to try the 1mg  tabs 0.5 to 1 mg BID needed and aware to cut in half for same dose as previous. If this rx is not covered, and she cannot afford out of pocket cost, we will receive PA form and proceed with her insurance.  Note forwarded to Latina Craver RN as Juluis Rainier that we will not complete this current PA.  Nobie Putnam, [place], PGY-3

## 2015-03-13 NOTE — Telephone Encounter (Signed)
Prior Authorization received from [facility] for Lorazepam 0.5 mg.  PA form placed in provider box for completion. Derl Barrow, RN

## 2015-04-02 ENCOUNTER — Other Ambulatory Visit: Payer: Self-pay | Admitting: Family Medicine

## 2015-04-02 DIAGNOSIS — E1140 Type 2 diabetes mellitus with diabetic neuropathy, unspecified: Secondary | ICD-10-CM

## 2015-04-16 ENCOUNTER — Encounter: Payer: Self-pay | Admitting: Family Medicine

## 2015-04-16 ENCOUNTER — Ambulatory Visit: Payer: Medicare Other | Admitting: Family Medicine

## 2015-04-16 ENCOUNTER — Telehealth: Payer: Self-pay | Admitting: Family Medicine

## 2015-04-16 NOTE — Telephone Encounter (Signed)
This is an extremely short time window, and luckily I am available to write this letter before 2pm today. I cannot permanently excuse her from jury duty but am requesting for an excuse from this current call to jury duty due to burden on her with her chronic anxiety and responsibilities providing continuous care to her husband with dementia. Letter will be signed and placed upfront at [facility] available for pick-up after 2pm today 04/16/15.  Nobie Putnam, [place], PGY-3

## 2015-04-16 NOTE — Telephone Encounter (Signed)
Pt called and needs a letter stating that she can not do jury duty on 04/30/15. She is the sole caretaker for her husband who has, dementia, bladder cancer, plus she has to drive him back and forth to the [place] and has upcoming surgery for a aneurysm in his heart and fluid on his brain. This is just issues with her husband, She has a very high stress level, anxiety for herself being that she is the sole caretaker. She has to have this letter by 2 pm today to turn in to the courts. They only except these letters on Tuesday's after 2 pm - 5 pm. This very important. jw

## 2015-05-07 ENCOUNTER — Ambulatory Visit (INDEPENDENT_AMBULATORY_CARE_PROVIDER_SITE_OTHER): Payer: Medicare Other | Admitting: Family Medicine

## 2015-05-07 ENCOUNTER — Encounter: Payer: Self-pay | Admitting: Family Medicine

## 2015-05-07 VITALS — BP 131/76 | HR 91 | Temp 98.1°F | Ht 66.0 in | Wt 179.0 lb

## 2015-05-07 DIAGNOSIS — G6289 Other specified polyneuropathies: Secondary | ICD-10-CM | POA: Diagnosis not present

## 2015-05-07 DIAGNOSIS — M545 Low back pain, unspecified: Secondary | ICD-10-CM

## 2015-05-07 DIAGNOSIS — I10 Essential (primary) hypertension: Secondary | ICD-10-CM

## 2015-05-07 DIAGNOSIS — J3089 Other allergic rhinitis: Secondary | ICD-10-CM

## 2015-05-07 DIAGNOSIS — M62830 Muscle spasm of back: Secondary | ICD-10-CM

## 2015-05-07 DIAGNOSIS — K219 Gastro-esophageal reflux disease without esophagitis: Secondary | ICD-10-CM

## 2015-05-07 DIAGNOSIS — E1140 Type 2 diabetes mellitus with diabetic neuropathy, unspecified: Secondary | ICD-10-CM

## 2015-05-07 DIAGNOSIS — L309 Dermatitis, unspecified: Secondary | ICD-10-CM | POA: Diagnosis not present

## 2015-05-07 LAB — POCT GLYCOSYLATED HEMOGLOBIN (HGB A1C): Hemoglobin A1C: 8.3

## 2015-05-07 MED ORDER — TRAMADOL HCL 50 MG PO TABS
50.0000 mg | ORAL_TABLET | Freq: Three times a day (TID) | ORAL | Status: DC | PRN
Start: 2015-05-07 — End: 2015-08-22

## 2015-05-07 MED ORDER — MECLIZINE HCL 25 MG PO TABS: 25.0000 mg | ORAL_TABLET | Freq: Two times a day (BID) | ORAL | Status: DC | PRN

## 2015-05-07 MED ORDER — PANTOPRAZOLE SODIUM 40 MG PO TBEC
40.0000 mg | DELAYED_RELEASE_TABLET | Freq: Every day | ORAL | Status: DC
Start: 2015-05-07 — End: 2016-02-27

## 2015-05-07 MED ORDER — ATENOLOL 25 MG PO TABS: ORAL_TABLET | ORAL | Status: DC

## 2015-05-07 MED ORDER — TRIAMCINOLONE ACETONIDE 0.1 % EX CREA
1.0000 | TOPICAL_CREAM | Freq: Two times a day (BID) | CUTANEOUS | Status: DC
Start: 2015-05-07 — End: 2015-05-07

## 2015-05-07 MED ORDER — HYDROCHLOROTHIAZIDE 12.5 MG PO CAPS: 12.5000 mg | ORAL_CAPSULE | Freq: Every day | ORAL | Status: DC

## 2015-05-07 MED ORDER — METFORMIN HCL 1000 MG PO TABS: 1000.0000 mg | ORAL_TABLET | Freq: Two times a day (BID) | ORAL | Status: DC

## 2015-05-07 MED ORDER — GABAPENTIN 300 MG PO CAPS: 600.0000 mg | ORAL_CAPSULE | Freq: Two times a day (BID) | ORAL | Status: DC | PRN

## 2015-05-07 MED ORDER — LOSARTAN POTASSIUM 50 MG PO TABS
50.0000 mg | ORAL_TABLET | Freq: Every day | ORAL | Status: DC
Start: 2015-05-07 — End: 2016-02-27

## 2015-05-07 MED ORDER — ATORVASTATIN CALCIUM 40 MG PO TABS: 40.0000 mg | ORAL_TABLET | Freq: Every day | ORAL | Status: DC

## 2015-05-07 MED ORDER — GLIPIZIDE 10 MG PO TABS: 10.0000 mg | ORAL_TABLET | Freq: Every day | ORAL | Status: DC

## 2015-05-07 MED ORDER — BACLOFEN 10 MG PO TABS
10.0000 mg | ORAL_TABLET | Freq: Three times a day (TID) | ORAL | Status: DC | PRN
Start: 2015-05-07 — End: 2015-12-31

## 2015-05-07 MED ORDER — TRIAMCINOLONE ACETONIDE 0.1 % EX CREA: 1.0000 "application " | TOPICAL_CREAM | Freq: Two times a day (BID) | CUTANEOUS | Status: DC

## 2015-05-07 NOTE — Progress Notes (Signed)
Subjective:    Patient ID: [name], female    DOB: [birth year], 65 y.o.   MRN: [id number]  [name] is a 65 y.o. female presenting on 05/07/2015 for Diabetes  HPI  CHRONIC DM, Type 2: - Last visit with me 01/2015 for DM, last saw Dr Jenne Campus Nutrition in 02/2015 CBGs: No detailed CBG readings available today - low of 85, checks up to 3x daily Meds: Glipizide 10 mg daily, Metformin 1000mg  BID Reports good compliance. Tolerating well w/o side-effects Currently on ARB Lifestyle: Diet (trying to improve DM diet per nutrition) / Exercise (no regular walking) - Chronic history of peripheral neuropathy, improved by Tramadol Denies hypoglycemia, polyuria, visual changes, numbness or tingling.  LEFT CHEST WALL PAIN, ATYPICAL: - Reports symptoms started about > 1 month ago, intermittent episodes few times a week, admits heart races sometimes, seems to improve with movements and rotations, describes sharp pains moderate severity, >1 min per episode - Thought was gas, tried Gas-X. Not tried Tylenol / Ibuprofen - Did have significant URI with cough and allergy symptoms back in 01/2015, seems to have had intermittent lingering cough. Followed by Allergist, saw recently and has continued her allergy meds (nasal steroid and anti-histamine) - Denies fevers/chills, sweats, nausea, vomiting, dyspnea  ECZEMA / FOOT RASH: - Reports history of eczema with some dry flaking patches of skin, responded to Triamcinolone previously, now has similar patch on Right medial foot, seems to be healing - Denies any redness, injury, purulent drainage / bleeding, swelling  CHRONIC HTN: Reports no concerns today Current Meds - Atenolol, HCTZ, losartan   Reports good compliance, took meds today. Tolerating well, w/o complaints. Denies CP, dyspnea, HA, edema, dizziness / lightheadedness  Discussed significant life stressors with caring for husband with early dementia, also with cancer.   Social History    Substance Use Topics  . Smoking status: Current Every Day Smoker -- 1.00 packs/day    Types: Cigarettes  . Smokeless tobacco: Never Used  . Alcohol Use: 7.0 oz/week    14 Standard drinks or equivalent per week     Comment: beer every 3-4 months     Review of Systems Per HPI unless specifically indicated above     Objective:    BP 131/76 mmHg  Pulse 91  Temp(Src) 98.1 F (36.7 C) (Oral)  Ht 5\' 6"  (1.676 m)  Wt 179 lb (81.194 kg)  BMI 28.91 kg/m2  Wt Readings from Last 3 Encounters:  05/07/15 179 lb (81.194 kg)  03/05/15 180 lb 4.8 oz (81.784 kg)  01/30/15 177 lb (80.287 kg)    Physical Exam  Constitutional: She appears well-developed and well-nourished. No distress.  Well-appearing, comfortable, cooperative  HENT:  Mouth/Throat: Oropharynx is clear and moist.  Sinuses non-tender. Nares patent without purulence or congestion. Oropharynx clear without edema or erythema.  Eyes: Conjunctivae are normal. Right eye exhibits no discharge. Left eye exhibits no discharge.  Neck: Normal range of motion. Neck supple. No thyromegaly present.  Cardiovascular: Normal rate, regular rhythm, normal heart sounds and intact distal pulses.   No murmur heard. Pulmonary/Chest: Effort normal and breath sounds normal. No respiratory distress. She has no wheezes. She has no rales. She exhibits no tenderness (Over left lower chest wall, non-tender / not reproducible pain).  Musculoskeletal: Normal range of motion. She exhibits no edema.  Lymphadenopathy:    She has no cervical adenopathy.  Neurological: She is alert.  Skin: Skin is warm and dry. She is not diaphoretic.  Right Foot -  medial aspect mid foot with about 3 x 3 cm area of dry flaking skin, some hyperpigmentation, seems to be healing, no ulceration or erythema. Non-tender.  Nursing note and vitals reviewed.  Results for orders placed or performed in visit on 05/07/15  POCT HgB A1C (CPT 83036)  Result Value Ref Range   Hemoglobin A1C  8.3       Assessment & Plan:   Problem List Items Addressed This Visit    Peripheral neuropathy ([place])    Stable. Continue Gabapentin. Refill Tramadol      Relevant Medications   traMADol (ULTRAM) 50 MG tablet   baclofen (LIORESAL) 10 MG tablet   gabapentin (NEURONTIN) 300 MG capsule   GERD   Relevant Medications   meclizine (ANTIVERT) 25 MG tablet   pantoprazole (PROTONIX) 40 MG tablet   Essential hypertension, benign    Stable. Controlled. At goal < [id number] No complications  Plan: 1. Continue current BP regimen 2. Due CMET 07/2015 3. Counseled on smoking cessation, start walking, DM diet      Relevant Medications   atorvastatin (LIPITOR) 40 MG tablet   hydrochlorothiazide (MICROZIDE) 12.5 MG capsule   losartan (COZAAR) 50 MG tablet   atenolol (TENORMIN) 25 MG tablet   Eczema    Refill Triamcinolone.      Relevant Medications   triamcinolone cream (KENALOG) 0.1 %   Diabetes mellitus with neuropathy ([facility]) - Primary    Stable to slightly improved, A1c 8.5 to 8.3 No hypoglycemia. Peripheral neuropathy  Plan: 1. Continue Glipizide 10mg  daily, Metformin, given printed CBG logs to bring next visit 2. Continue adherence to DM diet from Dr Jenne Campus / nutrition, start walking exercise 3. RTC 3 mo A1c, labs, consider spacing out A1c to q 6 mo if remains well controlled or < 8      Relevant Medications   atorvastatin (LIPITOR) 40 MG tablet   gabapentin (NEURONTIN) 300 MG capsule   losartan (COZAAR) 50 MG tablet   glipiZIDE (GLUCOTROL) 10 MG tablet   metFORMIN (GLUCOPHAGE) 1000 MG tablet   Other Relevant Orders   POCT HgB A1C (CPT 83036) (Completed)   Allergic rhinitis    Chronic problem, likely contributing to chronic cough and allergy symptoms. - Reassurance, no evidence sinus infection - Continue allergy regimen per [facility]       Other Visit Diagnoses    Bilateral low back pain without sciatica        Relevant Medications    traMADol (ULTRAM) 50 MG  tablet    baclofen (LIORESAL) 10 MG tablet    Muscle spasm of back        Relevant Medications    baclofen (LIORESAL) 10 MG tablet       Meds ordered this encounter  Medications  . DISCONTD: triamcinolone cream (KENALOG) 0.1 %    Sig: Apply 1 application topically 2 (two) times daily. For up to 2 weeks then stop.    Dispense:  30 g    Refill:  1  . traMADol (ULTRAM) 50 MG tablet    Sig: Take 1 tablet (50 mg total) by mouth every 8 (eight) hours as needed for moderate pain or severe pain.    Dispense:  90 tablet    Refill:  2  . baclofen (LIORESAL) 10 MG tablet    Sig: Take 1 tablet (10 mg total) by mouth 3 (three) times daily as needed for muscle spasms.    Dispense:  90 each    Refill:  5  .  atorvastatin (LIPITOR) 40 MG tablet    Sig: Take 1 tablet (40 mg total) by mouth daily.    Dispense:  90 tablet    Refill:  2  . meclizine (ANTIVERT) 25 MG tablet    Sig: Take 1-2 tablets (25-50 mg total) by mouth 2 (two) times daily as needed for dizziness. if having symptoms.  May repeat in 12 hours later.    Dispense:  30 tablet    Refill:  2  . triamcinolone cream (KENALOG) 0.1 %    Sig: Apply 1 application topically 2 (two) times daily. For up to 2 weeks then stop.    Dispense:  30 g    Refill:  1  . gabapentin (NEURONTIN) 300 MG capsule    Sig: Take 2-3 capsules (600-900 mg total) by mouth 2 (two) times daily as needed.    Dispense:  540 capsule    Refill:  2  . hydrochlorothiazide (MICROZIDE) 12.5 MG capsule    Sig: Take 1 capsule (12.5 mg total) by mouth daily.    Dispense:  90 capsule    Refill:  2  . losartan (COZAAR) 50 MG tablet    Sig: Take 1 tablet (50 mg total) by mouth daily.    Dispense:  90 tablet    Refill:  2  . pantoprazole (PROTONIX) 40 MG tablet    Sig: Take 1 tablet (40 mg total) by mouth daily.    Dispense:  90 tablet    Refill:  2  . atenolol (TENORMIN) 25 MG tablet    Sig: TAKE 1 TABLET(S) BY MOUTH DAILY    Dispense:  90 tablet    Refill:  2  .  glipiZIDE (GLUCOTROL) 10 MG tablet    Sig: Take 1 tablet (10 mg total) by mouth daily.    Dispense:  90 tablet    Refill:  2  . metFORMIN (GLUCOPHAGE) 1000 MG tablet    Sig: Take 1 tablet (1,000 mg total) by mouth 2 (two) times daily with a meal.    Dispense:  180 tablet    Refill:  2    Follow up plan: Return in about 3 months (around 08/06/2015) for diabetes.  Nobie Putnam, [place], PGY-3

## 2015-05-07 NOTE — Assessment & Plan Note (Signed)
Stable. Continue Gabapentin. Refill Tramadol

## 2015-05-07 NOTE — Patient Instructions (Addendum)
Dear [name], Thank you for coming in to clinic today. It was good to see you!  1. Good work A1c is down from 8.5 to 8.3 today - Most important is to work on improving Diet. Reduce portion size, carbs, sweets. Keep following Dr Jenne Campus' advice, follow-up with her if you need. - Bring blood sugar log to next appointment, write down numbers, check sugar at least once daily, any of the following times: - First thing in morning (before eating or drinking) fasting - 2 hour after eating lunch or dinner - Bed time (2 hours after eating anything)  2. No further testing today. We will check blood work for liver / cholesterol in 3 months  Use cream on foot dry skin, twice daily for 2 weeks then stop. Start using daily moisturizer with Aveeno / Eucerin or Vaseline all over especially dry spots, you can use this 15 min AFTER Triamcinolone  I think you have muscle strain from coughing on left side of chest, try heating pad or moist heat, even muscle rub to see if that relieves it. Take Tylenol as needed.  If you have any significant chest pain that does not go away within 30 minutes, is accompanied by nausea, sweating, shortness of breath, or made worse by activity, this may be evidence of a heart attack, especially if symptoms worsening instead of improving, please call 911 or go directly to the emergency room immediately for evaluation.   Please schedule a follow-up appointment with new doctor in 3 months for Diabetes / Follow-up  If you have any other questions or concerns, please feel free to call the clinic to contact me. You may also schedule an earlier appointment if necessary.  However, if your symptoms get significantly worse, please go to the Emergency Department to seek immediate medical attention.  Nobie Putnam, [place]

## 2015-05-07 NOTE — Assessment & Plan Note (Addendum)
Chronic problem, likely contributing to chronic cough and allergy symptoms. - Reassurance, no evidence sinus infection - Continue allergy regimen per [employer]

## 2015-05-07 NOTE — Assessment & Plan Note (Signed)
Stable to slightly improved, A1c 8.5 to 8.3 No hypoglycemia. Peripheral neuropathy  Plan: 1. Continue Glipizide 10mg  daily, Metformin, given printed CBG logs to bring next visit 2. Continue adherence to DM diet from Dr Jenne Campus / nutrition, start walking exercise 3. RTC 3 mo A1c, labs, consider spacing out A1c to q 6 mo if remains well controlled or < 8

## 2015-05-07 NOTE — Assessment & Plan Note (Signed)
Stable. Controlled. At goal < [id number] No complications  Plan: 1. Continue current BP regimen 2. Due CMET 07/2015 3. Counseled on smoking cessation, start walking, DM diet

## 2015-05-07 NOTE — Assessment & Plan Note (Signed)
Refill Triamcinolone 

## 2015-05-13 ENCOUNTER — Other Ambulatory Visit: Payer: Self-pay | Admitting: Allergy and Immunology

## 2015-05-13 ENCOUNTER — Ambulatory Visit
Admission: RE | Admit: 2015-05-13 | Discharge: 2015-05-13 | Disposition: A | Discharge status: Home / Self Care | Payer: Medicare Other | Source: Ambulatory Visit | Attending: Allergy and Immunology | Admitting: Allergy and Immunology

## 2015-05-13 DIAGNOSIS — J4520 Mild intermittent asthma, uncomplicated: Secondary | ICD-10-CM

## 2015-05-31 ENCOUNTER — Ambulatory Visit (INDEPENDENT_AMBULATORY_CARE_PROVIDER_SITE_OTHER): Payer: Medicare Other | Admitting: Internal Medicine

## 2015-05-31 ENCOUNTER — Encounter: Payer: Self-pay | Admitting: Internal Medicine

## 2015-05-31 VITALS — BP 118/70 | HR 79 | Ht 65.5 in | Wt 179.4 lb

## 2015-05-31 DIAGNOSIS — I119 Hypertensive heart disease without heart failure: Secondary | ICD-10-CM | POA: Diagnosis not present

## 2015-05-31 NOTE — Patient Instructions (Signed)

## 2015-05-31 NOTE — Progress Notes (Signed)
HPI [name] returns today for followup. She is a very pleasant middle-age woman with a history of SVT status post catheter ablation. She has had no recurrent SVT and only rare palpitations since her last visit. She has ongoing tobacco abuse and hypertension. She denies chest pain, shortness of breath, or syncope. Her back pain is improved. She is having trouble sleeping because her husband has been diagnosed with both CA and dementia.   Allergies  Allergen Reactions  . Alcohol Swabs [Isopropyl Alcohol] Nausea Only    Use peroxide instead  . Amoxicillin-Pot Clavulanate Other (See Comments)    REACTION: Unsure - maybe swollen?  Did not agree with her.  . Penicillins Other (See Comments)    REACTION: Unsure what reaction she has.     Current Outpatient Prescriptions  Medication Sig Dispense Refill  . acetaminophen (TYLENOL) 325 MG tablet Take 650 mg by mouth every 6 (six) hours as needed for moderate pain.    Marland Kitchen aspirin 81 MG chewable tablet Chew 1 tablet (81 mg total) by mouth daily. 100 tablet 0  . atenolol (TENORMIN) 25 MG tablet TAKE 1 TABLET(S) BY MOUTH DAILY 90 tablet 2  . atorvastatin (LIPITOR) 40 MG tablet Take 1 tablet (40 mg total) by mouth daily. 90 tablet 2  . baclofen (LIORESAL) 10 MG tablet Take 1 tablet (10 mg total) by mouth 3 (three) times daily as needed for muscle spasms. 90 each 5  . gabapentin (NEURONTIN) 300 MG capsule Take 2-3 capsules (600-900 mg total) by mouth 2 (two) times daily as needed. 540 capsule 2  . glipiZIDE (GLUCOTROL) 10 MG tablet Take 1 tablet (10 mg total) by mouth daily. 90 tablet 2  . hydrochlorothiazide (MICROZIDE) 12.5 MG capsule Take 1 capsule (12.5 mg total) by mouth daily. 90 capsule 2  . Lancets (ONETOUCH ULTRASOFT) lancets CHECK BLOOD SUGAR 3 TIMES DAILY. 100 each 5  . levalbuterol (XOPENEX HFA) 45 MCG/ACT inhaler Inhale 2 puffs into the lungs every 4 (four) hours as needed for wheezing or shortness of breath. Dr. Harold Hedge, Allergy and  Immunology    . LORazepam (ATIVAN) 1 MG tablet Take 0.5-1 tablets (0.5-1 mg total) by mouth 2 (two) times daily as needed for anxiety. 30 tablet 2  . losartan (COZAAR) 50 MG tablet Take 1 tablet (50 mg total) by mouth daily. 90 tablet 2  . meclizine (ANTIVERT) 25 MG tablet Take 1-2 tablets (25-50 mg total) by mouth 2 (two) times daily as needed for dizziness. if having symptoms.  May repeat in 12 hours later. 30 tablet 2  . metFORMIN (GLUCOPHAGE) 1000 MG tablet Take 1 tablet (1,000 mg total) by mouth 2 (two) times daily with a meal. 180 tablet 2  . mometasone (NASONEX) 50 MCG/ACT nasal spray Place 2 sprays into the nose daily. 17 g 3  . Olopatadine HCl (PATANASE) 0.6 % SOLN 1 drop (1 puff total) by Other route every morning. 30.5 g 0  . ONE TOUCH ULTRA TEST test strip     . pantoprazole (PROTONIX) 40 MG tablet Take 1 tablet (40 mg total) by mouth daily. 90 tablet 2  . polyethylene glycol (MIRALAX / GLYCOLAX) packet Take 17 g by mouth every other day.    . sucralfate (CARAFATE) 1 GM/10ML suspension Take 10 mLs (1 g total) by mouth 4 (four) times daily as needed (reflux). 420 mL 0  . traMADol (ULTRAM) 50 MG tablet Take 1 tablet (50 mg total) by mouth every 8 (eight) hours as needed for moderate pain  or severe pain. 90 tablet 2  . triamcinolone cream (KENALOG) 0.1 % Apply 1 application topically 2 (two) times daily. For up to 2 weeks then stop. 30 g 1   No current facility-administered medications for this visit.     Past Medical History  Diagnosis Date  . Anxiety   . Cataract   . DM2 (diabetes mellitus, type 2) ([place])   . Hyperlipidemia   . Hypertension     essential, benign   . Skin lesion   . Insomnia   . Weakness   . Screening for malignant neoplasm of the cervix   . Tobacco abuse   . Palpitations     hx  . Routine general medical examination at a health care facility   . Dyslipidemia   . Disturbance of skin sensation     facial paresthesia; left  . Menopausal syndrome   . GERD  (gastroesophageal reflux disease)   . AR (allergic rhinitis)   . Colon polyps     hyperplastic  . Hemorrhoids   . SVT (supraventricular tachycardia) ([place])   . Allergy   . Asthma   . CHF (congestive heart failure) ([facility])     ROS:   All systems reviewed and negative except as noted in the HPI.   Past Surgical History  Procedure Laterality Date  . Abdominal hysterectomy  1990    For fibroids benign.  Still has ovaries  . Appendectomy    . Dental extractions    . Sigmoidoscopy  2004  . Colonoscopy  2009  . Ablation for svt  9/09    Dr. Lovena Le     Family History  Problem Relation Age of Onset  . Coronary artery disease      female, 1st degree relative <50  . Heart attack Father 26  . Hypertension Mother   . Stroke Mother   . Diabetes Mother   . Stomach cancer Mother   . Cancer Mother   . Stomach cancer Maternal Aunt      Social History   Social History  . Marital Status: Married    Spouse Name: N/A  . Number of Children: 3  . Years of Education: 13   Occupational History  . Retired    Social History Main Topics  . Smoking status: Current Every Day Smoker -- 1.00 packs/day    Types: Cigarettes  . Smokeless tobacco: Never Used  . Alcohol Use: 7.0 oz/week    14 Standard drinks or equivalent per week     Comment: beer every 3-4 months   . Drug Use: No  . Sexual Activity: Not on file   Other Topics Concern  . Not on file   Social History Narrative   Married, does not get regular exercise. On disability.    2 miscarriages, one at 4 and one at 5 months; death at 1 day, unsure of cause.       Health Care POA:    Emergency Contact: Jeanene Erb (friend) [phone] Terri Piedra (friend's daughter) [phone]   End of Life Plan: Gave pt pamplet   Who lives with you: husband   Any pets: 0   Diet: pt has varied diet   Exercise: pt is disabled and does not have a regular exercise routine   Seatbelts: Pt reports wearing seatbelt when in vehicle   Sun  Exposure/Protection: Pt reports wearing a hat, sunglasses and facial sunscreen when outside   Hobbies: shopping and traveling     BP 118/70 mmHg  Pulse  79  Ht 5' 5.5" (1.664 m)  Wt 179 lb 6.4 oz (81.375 kg)  BMI 29.39 kg/m2  Physical Exam:  Well appearing obese, middle-age woman, NAD HEENT: Unremarkable Neck:  7 cm JVD, no thyromegally Lungs:  Clear with no wheezes, rales, or rhonchi. HEART:  Regular rate rhythm, no murmurs, no rubs, no clicks Abd:  soft, positive bowel sounds, no organomegally, no rebound, no guarding Ext:  2 plus pulses, no edema, no cyanosis, no clubbing Skin:  No rashes no nodules Neuro:  CN II through XII intact, motor grossly intact  EKG normal sinus rhythm with nonspecific ST-T wave abnormality.  Assess/Plan:  1. SVT - she is s/p ablation and has had only rare palpitations. No change. 2. HTN - her blood pressure is under control. She is encouraged to lose weight and increase her physical activity and to reduce her salt intake. 3. Tobacco abuse - I have encouraged her to stop smoking.  Mikle Bosworth.D.

## 2015-06-04 ENCOUNTER — Encounter: Payer: Self-pay | Admitting: Family Medicine

## 2015-06-04 ENCOUNTER — Other Ambulatory Visit: Payer: Self-pay | Admitting: *Deleted

## 2015-06-04 ENCOUNTER — Ambulatory Visit (INDEPENDENT_AMBULATORY_CARE_PROVIDER_SITE_OTHER): Payer: Medicare Other | Admitting: Family Medicine

## 2015-06-04 VITALS — BP 115/74 | HR 82 | Temp 98.4°F | Wt 177.8 lb

## 2015-06-04 DIAGNOSIS — E1140 Type 2 diabetes mellitus with diabetic neuropathy, unspecified: Secondary | ICD-10-CM

## 2015-06-04 DIAGNOSIS — L723 Sebaceous cyst: Secondary | ICD-10-CM | POA: Diagnosis not present

## 2015-06-04 MED ORDER — ONETOUCH ULTRA BLUE VI STRP: ORAL_STRIP | Status: DC

## 2015-06-04 NOTE — Patient Instructions (Signed)
Thank you for coming in to clinic today.  1. You most likely have a Sebaceous Cyst of your Left Shoulder, since this has been there for years - It does appear irritated after recent pressure on it - I do not see evidence of skin infection or unsure if there is deeper abscess or infection - Recommend more frequent warm compresses for up to 15 min at a time up to 4 times a day for next several days to help it heal and drain  If you develop increased pain, swelling, redness of skin, fever/chills or new symptoms, please return for re-evaluation, may need antibiotics if develop infection  Please schedule a follow-up appointment within next 2 weeks [facility] for Cyst Removal - or if unavailable you can schedule with Dr Parks Ranger for Cyst Removal (NEED 30 MIN PROCEDURE APPOINTMENT)  If you have any other questions or concerns, please feel free to call the clinic to contact me. You may also schedule an earlier appointment if necessary.  However, if your symptoms get significantly worse, please go to the Emergency Department to seek immediate medical attention.  Nobie Putnam, [place]

## 2015-06-04 NOTE — Progress Notes (Signed)
   Subjective:    Patient ID: [name], female    DOB: 1950-06-07, 65 y.o.   MRN: [id number]  [name] is a 65 y.o. female presenting on 06/04/2015 for boil on back   HPI  CYST, UPPER BACK: - Reports chronic history of left shoulder/upper back lesion started years ago initially as a "cyst" that looked like a "blackhead" it had grown in size, previously without any problems from it, non-tender, no drainage. Recently she squeezed it with one hand to dry to drain it but nothing came out, it became sore but has since improved. - Denies any fevers/chills, spreading redness on skin, drainage, nausea, vomiting  Social History  Substance Use Topics  . Smoking status: Current Every Day Smoker -- 1.00 packs/day    Types: Cigarettes  . Smokeless tobacco: Never Used  . Alcohol Use: 7.0 oz/week    14 Standard drinks or equivalent per week     Comment: beer every 3-4 months     Review of Systems Per HPI unless specifically indicated above     Objective:    BP 115/74 mmHg  Pulse 82  Temp(Src) 98.4 F (36.9 C) (Oral)  Wt 177 lb 12.8 oz (80.65 kg)  Wt Readings from Last 3 Encounters:  06/04/15 177 lb 12.8 oz (80.65 kg)  05/31/15 179 lb 6.4 oz (81.375 kg)  05/07/15 179 lb (81.194 kg)    Physical Exam  Constitutional: She appears well-developed and well-nourished. No distress.  Well-appearing, comfortable, cooperative  Skin: Skin is warm and dry. No rash noted. She is not diaphoretic. No erythema.  Left posterior lateral shoulder with 1.5 to 2 cm round area of induration without clear fluctuant cyst vs abscess formation, minimal localized superficial erythema on top of lesion otherwise not consistent with spreading erythema or cellulitis. No warmth. Non-tender. No ulceration or drainage.  Nursing note and vitals reviewed.      Assessment & Plan:   Problem List Items Addressed This Visit    Sebaceous cyst - Primary    Chronic (>5-10 years) left posterior shoulder likely  sebaceous cyst now with some recent irritation from pressure attempted drainage and compresses. Difficult to determine if deeper abscess vs cyst. No evidence of cellulitis or spreading erythema. Non-tender, and no systemic symptoms.  Plan: 1. Discussed various options with patient - mutual decision to not pursue I&D today and allow to heal vs evolve with warm compresses, more aggressive up to QID up to 15 min per treatment, also can do soaks 2. Advised likely will need removal of entire cyst to avoid recurrence given chronic problem 3. Follow-up with me 06/20/15 re-eval, possible cyst excision - Return sooner with criteria if concern for infection         No orders of the defined types were placed in this encounter.    Follow up plan: Return in about 2 weeks (around 06/18/2015) for cyst removal.  Nobie Putnam, DO [place], PGY-3

## 2015-06-04 NOTE — Assessment & Plan Note (Addendum)
Chronic (>5-10 years) left posterior shoulder likely sebaceous cyst now with some recent irritation from pressure attempted drainage and compresses. Difficult to determine if deeper abscess vs cyst. No evidence of cellulitis or spreading erythema. Non-tender, and no systemic symptoms.  Plan: 1. Discussed various options with patient - mutual decision to not pursue I&D today and allow to heal vs evolve with warm compresses, more aggressive up to QID up to 15 min per treatment, also can do soaks 2. Advised likely will need removal of entire cyst to avoid recurrence given chronic problem 3. Follow-up with me 06/20/15 re-eval, possible cyst excision - Return sooner with criteria if concern for infection

## 2015-06-11 ENCOUNTER — Other Ambulatory Visit: Payer: Self-pay | Admitting: *Deleted

## 2015-06-11 DIAGNOSIS — F411 Generalized anxiety disorder: Secondary | ICD-10-CM

## 2015-06-11 MED ORDER — LORAZEPAM 1 MG PO TABS: 0.5000 mg | ORAL_TABLET | Freq: Two times a day (BID) | ORAL | Status: DC | PRN

## 2015-06-11 NOTE — Telephone Encounter (Signed)
Phoned in refill Lorazepam 1mg  tabs - take 0.5-1 tab BID PRN anxiety #30 tabs, +2 refills.  Nobie Putnam, [place], PGY-3

## 2015-06-12 ENCOUNTER — Ambulatory Visit (INDEPENDENT_AMBULATORY_CARE_PROVIDER_SITE_OTHER): Payer: Medicare Other | Admitting: Family Medicine

## 2015-06-12 ENCOUNTER — Encounter: Payer: Self-pay | Admitting: Family Medicine

## 2015-06-12 VITALS — BP 142/116 | HR 103 | Temp 99.2°F | Wt 178.4 lb

## 2015-06-12 DIAGNOSIS — L723 Sebaceous cyst: Secondary | ICD-10-CM

## 2015-06-12 NOTE — Patient Instructions (Signed)
Epidermal Cyst An epidermal cyst is sometimes called a sebaceous cyst, epidermal inclusion cyst, or infundibular cyst. These cysts usually contain a substance that looks "pasty" or "cheesy" and may have a bad smell. This substance is a protein called keratin. Epidermal cysts are usually found on the face, neck, or trunk. They may also occur in the vaginal area or other parts of the genitalia of both men and women. Epidermal cysts are usually small, painless, slow-growing bumps or lumps that move freely under the skin. It is important not to try to pop them. This may cause an infection and lead to tenderness and swelling. CAUSES  Epidermal cysts may be caused by a deep penetrating injury to the skin or a plugged hair follicle, often associated with acne. SYMPTOMS  Epidermal cysts can become inflamed and cause:  Redness.  Tenderness.  Increased temperature of the skin over the bumps or lumps.  Grayish-white, bad smelling material that drains from the bump or lump. DIAGNOSIS  Epidermal cysts are easily diagnosed by your caregiver during an exam. Rarely, a tissue sample (biopsy) may be taken to rule out other conditions that may resemble epidermal cysts. TREATMENT   Epidermal cysts often get better and disappear on their own. They are rarely ever cancerous.  If a cyst becomes infected, it may become inflamed and tender. This may require opening and draining the cyst. Treatment with antibiotics may be necessary. When the infection is gone, the cyst may be removed with minor surgery.  Small, inflamed cysts can often be treated with antibiotics or by injecting steroid medicines.  Sometimes, epidermal cysts become large and bothersome. If this happens, surgical removal in your caregiver's office may be necessary. HOME CARE INSTRUCTIONS  Only take over-the-counter or prescription medicines as directed by your caregiver.  Take your antibiotics as directed. Finish them even if you start to feel  better. SEEK MEDICAL CARE IF:   Your cyst becomes tender, red, or swollen.  Your condition is not improving or is getting worse.  You have any other questions or concerns. MAKE SURE YOU:  Understand these instructions.  Will watch your condition.  Will get help right away if you are not doing well or get worse.   This information is not intended to replace advice given to you by your health care provider. Make sure you discuss any questions you have with your health care provider.   Document Released: 11/30/2003 Document Revised: 03/23/2011 Document Reviewed: 07/07/2010 Elsevier Interactive Patient Education 2016 [employer] Inc[employer]  

## 2015-06-12 NOTE — Progress Notes (Signed)
Subjective: SAMNANG PILI is a 65 y.o. female returning for cyst on her back.   She reports being assessed by her PCP a week ago (5/23) to have a sebaceous cyst that was not infected. This cyst has been present for many years but had recently grown in size. She has continued warm compresses until the area "popped" and began draining dark material 5 days ago. This has continued. She keeps the area covered with neosporin and gauze. It is tender, but no more so than previous to rupture.   - ROS: No fevers, chills, nausea or vomiting.  - Smoker  Objective: BP 142/116 mmHg  Pulse 103  Temp(Src) 99.2 F (37.3 C) (Oral)  Wt 178 lb 6.4 oz (80.922 kg) Gen: Well-appearing 65 y.o. female in no distress Skin: ~1.5 cm ruptured cyst cavity filled with brown/black soft material with more dense yellow appendage loosely covering it. There is ~ 0.5cm surrounding hyperpigmentation/erythema and palpable ~10 cm deep induration that is tender to palpation. There is no true purulence.  PROCEDURE: DEBRIDEMENT OF SEBACEOUS CYST Verbal consent attained. Area is prepped with iodine. Using sterile instruments, the area is debrided gently without anesthesia with near complete evacuation of material contents revealing moist wound bed. Yellow capsule is removed by excision with negligible blood loss. Patient tolerated procedure well, wound care instructions provided.   Assessment/Plan: DELAINA HOVORKA is a 65 y.o. female here for ruptured sebaceous cyst without evidence of cellulitis.  Sebaceous cyst Now spontaneously ruptured. No apparent indication for abx at this time. Area debrided with partial cyst wall excision hoping to limit likelihood of recurrence. She will continue warm compresses, OTC pain relievers prn.  - Case precepted with Dr. Andria Frames.  - Still to follow up 6/8 with PCP or call sooner if area becomes more painful, red, or systemic symptoms develop.

## 2015-06-12 NOTE — Assessment & Plan Note (Signed)
Now spontaneously ruptured. No apparent indication for abx at this time. Area debrided with partial cyst wall excision hoping to limit likelihood of recurrence. She will continue warm compresses, OTC pain relievers prn.  - Case precepted with Dr. Andria Frames.  - Still to follow up 6/8 with PCP or call sooner if area becomes more painful, red, or systemic symptoms develop.

## 2015-06-20 ENCOUNTER — Ambulatory Visit (INDEPENDENT_AMBULATORY_CARE_PROVIDER_SITE_OTHER): Payer: Medicare Other | Admitting: Family Medicine

## 2015-06-20 ENCOUNTER — Encounter: Payer: Self-pay | Admitting: Family Medicine

## 2015-06-20 VITALS — BP 137/73 | HR 92 | Temp 98.4°F | Ht 65.5 in | Wt 178.6 lb

## 2015-06-20 DIAGNOSIS — L723 Sebaceous cyst: Secondary | ICD-10-CM

## 2015-06-20 NOTE — Assessment & Plan Note (Signed)
Resolved, s/p spontaneous rupture and debridement on 5/31. No sign of infection or residual cyst. Anticipate may take weeks to months for new skin to fill in and heal over Advised limit peroxide use. May switch neosporin to regular vaseline. Leave open to air for several hours nightly. Re-dressing applied today. Follow-up PRN

## 2015-06-20 NOTE — Patient Instructions (Addendum)
Thank you for coming in to clinic today.  1. The cyst looks to be healing, now the area will take some time to build up new skin 2. You don't need to do the peroxide every day now, you can gradually wean this off. It may be irritating to the healing skin. 3. You can continue either the topical antibiotic or if you prefer even just regular vasline to keep it protected while it heals, you may cover it with gauze, most of the time if active during the day, otherwise leave gauze off and open to air for several hours every night  If you develop pain, redness, swelling, drainage of pus or foul smelling material, fevers, or new symptoms let us know, otherwise this may take several weeks to months to fully heal over.  All of your medications were sent electronically to pharmacy back on 05/07/15 at last visit and these were 90 day supplies with +2 refills, so good for total of 9 months.  The lorazepam rx was phoned in by me to your pharmacy on 06/11/15, I cannot re-prescribe this one. It was given to you for 1 month supply +2 refills for total of 3 months. This should last until your next appointment.  Please schedule a follow-up appointment with New Doctor as planned in about 2 months for Diabetes, A1c follow-up  If you have any other questions or concerns, please feel free to call the clinic to contact me. You may also schedule an earlier appointment if necessary.  However, if your symptoms get significantly worse, please go to the Emergency Department to seek immediate medical attention.  Nobie Putnam, [place]

## 2015-06-20 NOTE — Progress Notes (Signed)
   Subjective:    Patient ID: [name], female    DOB: [birth year], 65 y.o.   MRN: [id number]  [name] is a 65 y.o. female presenting on 06/20/2015 for Follow-up   HPI  FOLLOW-UP SEBACEOUS CYST, L-UPPER BACK: - Last seen by me on 5/23 for same problem, advised to do aggressive warm/hot compresses, no sign of infection with chronic cyst with recent enlargement. Followed up on 06/12/15 saw Dr Bonner Puna for same problem s/p spontaneous rupture of cyst, had office procedure for cyst debridement, removed partial cyst capsule. Again no sign of infection. - Today presents for 1 week follow-up, reports doing well. Former cyst area now is about 1 cm "hole" that is healing, she uses peroxide on it and neosporin, covers with gauze most of the day. - Denies any fevers/chills, pain, redness on skin, drainage, nausea, vomiting  Social History  Substance Use Topics  . Smoking status: Current Every Day Smoker -- 1.00 packs/day    Types: Cigarettes  . Smokeless tobacco: Never Used  . Alcohol Use: 7.0 oz/week    14 Standard drinks or equivalent per week     Comment: beer every 3-4 months     Review of Systems Per HPI unless specifically indicated above     Objective:    BP 137/73 mmHg  Pulse 92  Temp(Src) 98.4 F (36.9 C) (Oral)  Ht 5' 5.5" (1.664 m)  Wt 178 lb 9.6 oz (81.012 kg)  BMI 29.26 kg/m2  Wt Readings from Last 3 Encounters:  06/20/15 178 lb 9.6 oz (81.012 kg)  06/12/15 178 lb 6.4 oz (80.922 kg)  06/04/15 177 lb 12.8 oz (80.65 kg)    Physical Exam  Constitutional: She appears well-developed and well-nourished. No distress.  Well-appearing, comfortable, cooperative  Skin: Skin is warm and dry. No rash noted. She is not diaphoretic. No erythema.  Left posterior lateral shoulder Re-check today s/p spontaneous cyst rupture - 1.5 cm circular open cavity with healing granulation tissue surrounding by normal healthy skin, mild localized non-tender induration, no fluctuance, no  erythema, no drainage.  Nursing note and vitals reviewed.      Assessment & Plan:   Problem List Items Addressed This Visit    Sebaceous cyst - Primary    Resolved, s/p spontaneous rupture and debridement on 5/31. No sign of infection or residual cyst. Anticipate may take weeks to months for new skin to fill in and heal over Advised limit peroxide use. May switch neosporin to regular vaseline. Leave open to air for several hours nightly. Re-dressing applied today. Follow-up PRN         No orders of the defined types were placed in this encounter.    Also patient asking about her rx from previous visit, I confirmed that all non-controlled substances were electronically sent to pharmacy on 05/07/15 for 90 day supply +2 refills, and Lorazepam phoned in to pharmacy on 06/11/15 30 day supply +2 refills. No new rx today.  Follow up plan: Return in about 2 months (around 08/20/2015) for diabetes.  Nobie Putnam, [place], PGY-3

## 2015-07-22 ENCOUNTER — Telehealth: Payer: Self-pay | Admitting: *Deleted

## 2015-07-22 NOTE — Telephone Encounter (Signed)
Patient refusing to see Dr. Yisroel Ramming since he is an intern scheduled her follow up with dr. Ola Spurr and informed her that I would send message to your regarding changing PCPs.

## 2015-07-26 NOTE — Telephone Encounter (Signed)
Returned call to patient to obtain additional info regarding request for PCP change.  Left message to call our office back.  Burna Forts, BSN, RN-BC

## 2015-08-20 LAB — HM DIABETES EYE EXAM

## 2015-08-22 ENCOUNTER — Encounter: Payer: Self-pay | Admitting: Internal Medicine

## 2015-08-22 ENCOUNTER — Ambulatory Visit (INDEPENDENT_AMBULATORY_CARE_PROVIDER_SITE_OTHER): Payer: Medicare Other | Admitting: Internal Medicine

## 2015-08-22 VITALS — BP 128/69 | HR 101 | Ht 66.0 in | Wt 176.0 lb

## 2015-08-22 DIAGNOSIS — E785 Hyperlipidemia, unspecified: Secondary | ICD-10-CM | POA: Diagnosis not present

## 2015-08-22 DIAGNOSIS — K219 Gastro-esophageal reflux disease without esophagitis: Secondary | ICD-10-CM | POA: Diagnosis not present

## 2015-08-22 DIAGNOSIS — E1140 Type 2 diabetes mellitus with diabetic neuropathy, unspecified: Secondary | ICD-10-CM

## 2015-08-22 DIAGNOSIS — F411 Generalized anxiety disorder: Secondary | ICD-10-CM | POA: Diagnosis not present

## 2015-08-22 DIAGNOSIS — G6289 Other specified polyneuropathies: Secondary | ICD-10-CM

## 2015-08-22 LAB — POCT GLYCOSYLATED HEMOGLOBIN (HGB A1C): HEMOGLOBIN A1C: 8.4

## 2015-08-22 LAB — BASIC METABOLIC PANEL WITH GFR
BUN: 10 mg/dL (ref 7–25)
CHLORIDE: 101 mmol/L (ref 98–110)
CO2: 29 mmol/L (ref 20–31)
CREATININE: 0.61 mg/dL (ref 0.50–0.99)
Calcium: 9.9 mg/dL (ref 8.6–10.4)
GFR, Est African American: >89 mL/min (ref >=60)
Glucose, Bld: 188 mg/dL — ABNORMAL HIGH (ref 65–99)
Potassium: 3.4 mmol/L — ABNORMAL LOW (ref 3.5–5.3)
SODIUM: 140 mmol/L (ref 135–146)

## 2015-08-22 LAB — LIPID PANEL
Cholesterol: 107 mg/dL — ABNORMAL LOW (ref 125–200)
HDL: 38 mg/dL — AB (ref >=46)
LDL Cholesterol: 45 mg/dL (ref <130)
Total CHOL/HDL Ratio: 2.8 Ratio (ref <=5.0)
Triglycerides: 118 mg/dL (ref <150)
VLDL: 24 mg/dL (ref <30)

## 2015-08-22 MED ORDER — LORAZEPAM 1 MG PO TABS: 0.5000 mg | ORAL_TABLET | Freq: Two times a day (BID) | ORAL | 2 refills | Status: DC | PRN

## 2015-08-22 MED ORDER — TRAMADOL HCL 50 MG PO TABS: 50.0000 mg | ORAL_TABLET | Freq: Three times a day (TID) | ORAL | 2 refills | Status: DC | PRN

## 2015-08-22 MED ORDER — SUCRALFATE 1 GM/10ML PO SUSP: 1.0000 g | Freq: Four times a day (QID) | ORAL | 0 refills | Status: DC | PRN

## 2015-08-22 MED ORDER — METFORMIN HCL 1000 MG PO TABS: 1000.0000 mg | ORAL_TABLET | Freq: Two times a day (BID) | ORAL | 2 refills | Status: DC

## 2015-08-22 NOTE — Patient Instructions (Signed)
Ms. Winchester,  It was nice to meet you today.  I will call you with your lab results.  Please follow-up with me in 3 months. If you have availability the morning of the 17th, 24th, or 31st, I would love to see you for nutrition clinic, as well. Please make an appointment up front if you are free.  Best, Dr. Ola Spurr  Diabetes Mellitus and Food It is important for you to manage your blood sugar (glucose) level. Your blood glucose level can be greatly affected by what you eat. Eating healthier foods in the appropriate amounts throughout the day at about the same time each day will help you control your blood glucose level. It can also help slow or prevent worsening of your diabetes mellitus. Healthy eating may even help you improve the level of your blood pressure and reach or maintain a healthy weight.  General recommendations for healthful eating and cooking habits include:  Eating meals and snacks regularly. Avoid going long periods of time without eating to lose weight.  Eating a diet that consists mainly of plant-based foods, such as fruits, vegetables, nuts, legumes, and whole grains.  Using low-heat cooking methods, such as baking, instead of high-heat cooking methods, such as deep frying. Work with your dietitian to make sure you understand how to use the Nutrition Facts information on food labels. HOW CAN FOOD AFFECT ME? Carbohydrates Carbohydrates affect your blood glucose level more than any other type of food. Your dietitian will help you determine how many carbohydrates to eat at each meal and teach you how to count carbohydrates. Counting carbohydrates is important to keep your blood glucose at a healthy level, especially if you are using insulin or taking certain medicines for diabetes mellitus. Alcohol Alcohol can cause sudden decreases in blood glucose (hypoglycemia), especially if you use insulin or take certain medicines for diabetes mellitus. Hypoglycemia can be a  life-threatening condition. Symptoms of hypoglycemia (sleepiness, dizziness, and disorientation) are similar to symptoms of having too much alcohol.  If your health care provider has given you approval to drink alcohol, do so in moderation and use the following guidelines:  Women should not have more than one drink per day, and men should not have more than two drinks per day. One drink is equal to:  12 oz of beer.  5 oz of wine.  1 oz of hard liquor.  Do not drink on an empty stomach.  Keep yourself hydrated. Have water, diet soda, or unsweetened iced tea.  Regular soda, juice, and other mixers might contain a lot of carbohydrates and should be counted. WHAT FOODS ARE NOT RECOMMENDED? As you make food choices, it is important to remember that all foods are not the same. Some foods have fewer nutrients per serving than other foods, even though they might have the same number of calories or carbohydrates. It is difficult to get your body what it needs when you eat foods with fewer nutrients. Examples of foods that you should avoid that are high in calories and carbohydrates but low in nutrients include:  Trans fats (most processed foods list trans fats on the Nutrition Facts label).  Regular soda.  Juice.  Candy.  Sweets, such as cake, pie, doughnuts, and cookies.  Fried foods. WHAT FOODS CAN I EAT? Eat nutrient-rich foods, which will nourish your body and keep you healthy. The food you should eat also will depend on several factors, including:  The calories you need.  The medicines you take.  Your weight.  Your blood glucose level.  Your blood pressure level.  Your cholesterol level. You should eat a variety of foods, including:  Protein.  Lean cuts of meat.  Proteins low in saturated fats, such as fish, egg whites, and beans. Avoid processed meats.  Fruits and vegetables.  Fruits and vegetables that may help control blood glucose levels, such as apples,  mangoes, and yams.  Dairy products.  Choose fat-free or low-fat dairy products, such as milk, yogurt, and cheese.  Grains, bread, pasta, and rice.  Choose whole grain products, such as multigrain bread, whole oats, and brown rice. These foods may help control blood pressure.  Fats.  Foods containing healthful fats, such as nuts, avocado, olive oil, canola oil, and fish. DOES EVERYONE WITH DIABETES MELLITUS HAVE THE SAME MEAL PLAN? Because every person with diabetes mellitus is different, there is not one meal plan that works for everyone. It is very important that you meet with a dietitian who will help you create a meal plan that is just right for you.   This information is not intended to replace advice given to you by your health care provider. Make sure you discuss any questions you have with your health care provider.   Document Released: 09/25/2004 Document Revised: 01/19/2014 Document Reviewed: 11/25/2012 Elsevier Interactive Patient Education [employer].

## 2015-08-22 NOTE — Progress Notes (Signed)
[facility] Progress Note  Subjective:  [name] is a 65-y/o female who presents for diabetes follow-up.  T2DM: - Checks her blood sugars 2-3 times a day, says it is normally around 160-170 in the morning and 220-230 in the evening - Expected A1c to be higher due to increased stress from caring for her husband who has dementia and also was recently treated for cancer  - Has an erratic eating schedule due to being present for her husband's medical treatments and getting up before 5 am to drive her husband to work - Has been trying to incorporate breakfast into her daily routine - Reports she saw Dr. Delman Cheadle for annual eye exam on Tuesday and that his office was going to fax over report -- says was told R cataract worse - Has not found any time to exercise - Has not seen much change in her CBGs after eating sweet things like dessert or fruit  - Still taking glipizide 10 mg daily, metformin 1000 mg BID - Continues to take gabapentin for diabetic neuropathy - Has not followed up with Nutritionist recently - Always has a drink in the car in case of hypoglycemia (reports < 6 episodes over the last year) ROS: Denies vision changes, dysuria, falls, abdominal pain; endorses chronic back pain (stable)  Social: Current smoker  Objective: Blood pressure 128/69, pulse (!) 101, height 5\' 6"  (1.676 m), weight 176 lb (79.8 kg). Constitutional: Well-appearing female in NAD Cardiovascular: RRR, S1, S2, no m/r/g.  Pulmonary/Chest: Effort normal and breath sounds normal. No respiratory distress.  Musculoskeletal: No LE edema.  Neurological: AOx3, no focal deficits. Skin: Skin is warm and dry. No rash noted. No erythema. Several comedones of upper back.  Psychiatric: Normal mood and affect.  Vitals reviewed  Assessment/Plan: Diabetes mellitus with neuropathy ([place]) - Stable. Hgb A1c 8.4, compared to 8.3 at last visit.  - Unable to focus on diet and exercise recently due to life  stressors - Recommended making a nutrition visit with me and Dr. Jenne Campus later this month - Would consider increasing metformin to 850 mg TID if patient continues to struggle with dietary changes - Recheck hgb A1c in 3 months - Will check BMET and lipid panel today  Also provided refills for triamcinolone, tramadol, metformin, and carafate.   Follow-up in 3 months or sooner as needed.  [name], MD [place], PGY-2

## 2015-08-23 NOTE — Assessment & Plan Note (Signed)
-   Stable. Hgb A1c 8.4, compared to 8.3 at last visit.  - Unable to focus on diet and exercise recently due to life stressors - Recommended making a nutrition visit with me and Dr. Jenne Campus later this month - Would consider increasing metformin to 850 mg TID if patient continues to struggle with dietary changes - Recheck hgb A1c in 3 months - Will check BMET and lipid panel today

## 2015-08-26 ENCOUNTER — Encounter: Payer: Self-pay | Admitting: Internal Medicine

## 2015-10-03 ENCOUNTER — Emergency Department (HOSPITAL_COMMUNITY): Payer: Medicare Other

## 2015-10-03 ENCOUNTER — Encounter (HOSPITAL_COMMUNITY): Payer: Self-pay | Admitting: *Deleted

## 2015-10-03 DIAGNOSIS — S46812A Strain of other muscles, fascia and tendons at shoulder and upper arm level, left arm, initial encounter: Secondary | ICD-10-CM | POA: Insufficient documentation

## 2015-10-03 DIAGNOSIS — E119 Type 2 diabetes mellitus without complications: Secondary | ICD-10-CM | POA: Insufficient documentation

## 2015-10-03 DIAGNOSIS — Z7982 Long term (current) use of aspirin: Secondary | ICD-10-CM | POA: Insufficient documentation

## 2015-10-03 DIAGNOSIS — I509 Heart failure, unspecified: Secondary | ICD-10-CM | POA: Diagnosis not present

## 2015-10-03 DIAGNOSIS — X58XXXA Exposure to other specified factors, initial encounter: Secondary | ICD-10-CM | POA: Insufficient documentation

## 2015-10-03 DIAGNOSIS — Y929 Unspecified place or not applicable: Secondary | ICD-10-CM | POA: Insufficient documentation

## 2015-10-03 DIAGNOSIS — R0789 Other chest pain: Secondary | ICD-10-CM | POA: Insufficient documentation

## 2015-10-03 DIAGNOSIS — J45909 Unspecified asthma, uncomplicated: Secondary | ICD-10-CM | POA: Insufficient documentation

## 2015-10-03 DIAGNOSIS — Y999 Unspecified external cause status: Secondary | ICD-10-CM | POA: Diagnosis not present

## 2015-10-03 DIAGNOSIS — Z7984 Long term (current) use of oral hypoglycemic drugs: Secondary | ICD-10-CM | POA: Insufficient documentation

## 2015-10-03 DIAGNOSIS — I110 Hypertensive heart disease with heart failure: Secondary | ICD-10-CM | POA: Insufficient documentation

## 2015-10-03 DIAGNOSIS — Y939 Activity, unspecified: Secondary | ICD-10-CM | POA: Diagnosis not present

## 2015-10-03 DIAGNOSIS — F17210 Nicotine dependence, cigarettes, uncomplicated: Secondary | ICD-10-CM | POA: Insufficient documentation

## 2015-10-03 DIAGNOSIS — S4992XA Unspecified injury of left shoulder and upper arm, initial encounter: Secondary | ICD-10-CM | POA: Diagnosis present

## 2015-10-03 DIAGNOSIS — Z794 Long term (current) use of insulin: Secondary | ICD-10-CM | POA: Diagnosis not present

## 2015-10-03 LAB — BASIC METABOLIC PANEL
Anion gap: 11 (ref 5–15)
BUN: 10 mg/dL (ref 6–20)
CHLORIDE: 102 mmol/L (ref 101–111)
CO2: 25 mmol/L (ref 22–32)
CREATININE: 0.71 mg/dL (ref 0.44–1.00)
Calcium: 9.7 mg/dL (ref 8.9–10.3)
GFR calc non Af Amer: >60 mL/min (ref >60)
Glucose, Bld: 284 mg/dL — ABNORMAL HIGH (ref 65–99)
POTASSIUM: 3.4 mmol/L — AB (ref 3.5–5.1)
SODIUM: 138 mmol/L (ref 135–145)

## 2015-10-03 LAB — I-STAT TROPONIN, ED: Troponin i, poc: 0 ng/mL (ref 0.00–0.08)

## 2015-10-03 LAB — CBC
HEMATOCRIT: 41.8 % (ref 36.0–46.0)
Hemoglobin: 14.3 g/dL (ref 12.0–15.0)
MCH: 32.4 pg (ref 26.0–34.0)
MCHC: 34.2 g/dL (ref 30.0–36.0)
MCV: 94.6 fL (ref 78.0–100.0)
PLATELETS: 264 10*3/uL (ref 150–400)
RBC: 4.42 MIL/uL (ref 3.87–5.11)
RDW: 14 % (ref 11.5–15.5)
WBC: 9.8 10*3/uL (ref 4.0–10.5)

## 2015-10-03 NOTE — ED Triage Notes (Signed)
Pt c/o neck pain and L shoulder pain onset this morning after waking up. Also reports centralized and L sided chest pain

## 2015-10-04 ENCOUNTER — Emergency Department (HOSPITAL_COMMUNITY)
Admission: EM | Admit: 2015-10-04 | Discharge: 2015-10-04 | Disposition: A | Discharge status: Home / Self Care | Payer: Medicare Other | Attending: Emergency Medicine | Admitting: Emergency Medicine

## 2015-10-04 DIAGNOSIS — R079 Chest pain, unspecified: Secondary | ICD-10-CM

## 2015-10-04 DIAGNOSIS — S46812A Strain of other muscles, fascia and tendons at shoulder and upper arm level, left arm, initial encounter: Secondary | ICD-10-CM

## 2015-10-04 LAB — I-STAT TROPONIN, ED: TROPONIN I, POC: 0 ng/mL (ref 0.00–0.08)

## 2015-10-04 NOTE — ED Provider Notes (Signed)
By signing my name below, I, Reola Mosher, attest that this documentation has been prepared under the direction and in the presence of [place], DO. Electronically Signed: Reola Mosher, ED Scribe. 10/04/15. 3:46 AM.  TIME SEEN: 3:37 AM  CHIEF COMPLAINT:  Chief Complaint  Patient presents with  . Torticollis  . Chest Pain   HPI Comments: [name] is a right hand 65 y.o. female with a PMHx of CHF, HTN, HLD, SVT, GERD and anxiety, who presents to the Emergency Department complaining of  gradual onset Left shoulder pain onset ~2-3 days ago, worsening tonight PTA.  No known trauma or injury to the shoulder. She has been taking Baclofen, Tramadol, and Tylenol with moderate relief of her pain, but notes that it always returns. No exacerbating factors noted. No focal numbness/weakness. No injury to the shoulder. She is right-hand dominant.  Pt is also c/o isolated centralized and left sided, dull chest pain onset ~7-8 hours ago. Her pain has subsided while in the ED. She states that her episode of pain lasted "for a while". No precipitating or exacerbating factors noted. However, pt does express that she has been moderately more stressed in her life recently.  Denies SOB, nausea, vomiting, diaphoresis, dizziness, fever, cough, new numbness, focal weakness, or any other associated symptoms. No history of cardiac disease. No history of PE or DVT. No lower extremity swelling or pain.  ROS: See HPI Constitutional: no fever  Eyes: no drainage  ENT: no runny nose   Cardiovascular:  chest pain  Resp: no SOB  GI: no vomiting GU: no dysuria Integumentary: no rash  Allergy: no hives  Musculoskeletal: no leg swelling  Neurological: no slurred speech ROS otherwise negative  PAST MEDICAL HISTORY/PAST SURGICAL HISTORY:  Past Medical History:  Diagnosis Date  . Allergy   . Anxiety   . AR (allergic rhinitis)   . Asthma   . Cataract   . CHF (congestive heart failure) ([place])    . Colon polyps    hyperplastic  . Disturbance of skin sensation    facial paresthesia; left  . DM2 (diabetes mellitus, type 2) ([place])   . Dyslipidemia   . GERD (gastroesophageal reflux disease)   . Hemorrhoids   . Hyperlipidemia   . Hypertension    essential, benign   . Insomnia   . Menopausal syndrome   . Palpitations    hx  . Routine general medical examination at a health care facility   . Screening for malignant neoplasm of the cervix   . Skin lesion   . SVT (supraventricular tachycardia) ([place])   . Tobacco abuse   . Weakness     MEDICATIONS:  Prior to Admission medications   Medication Sig Start Date End Date Taking? Authorizing Provider  acetaminophen (TYLENOL) 325 MG tablet Take 650 mg by mouth every 6 (six) hours as needed for moderate pain.    Historical Provider, MD  aspirin 81 MG chewable tablet Chew 1 tablet (81 mg total) by mouth daily. 06/07/13   Josalyn Funches, MD  atenolol (TENORMIN) 25 MG tablet TAKE 1 TABLET(S) BY MOUTH DAILY 05/07/15   Olin Hauser, DO  atorvastatin (LIPITOR) 40 MG tablet Take 1 tablet (40 mg total) by mouth daily. 05/07/15   Olin Hauser, DO  baclofen (LIORESAL) 10 MG tablet Take 1 tablet (10 mg total) by mouth 3 (three) times daily as needed for muscle spasms. 05/07/15   Olin Hauser, DO  gabapentin (NEURONTIN) 300 MG capsule  Take 2-3 capsules (600-900 mg total) by mouth 2 (two) times daily as needed. 05/07/15   Devonne Doughty Karamalegos, DO  glipiZIDE (GLUCOTROL) 10 MG tablet Take 1 tablet (10 mg total) by mouth daily. 05/07/15   Olin Hauser, DO  hydrochlorothiazide (MICROZIDE) 12.5 MG capsule Take 1 capsule (12.5 mg total) by mouth daily. 05/07/15   Olin Hauser, DO  Lancets (ONETOUCH ULTRASOFT) lancets CHECK BLOOD SUGAR 3 TIMES DAILY. 04/03/15   Devonne Doughty Karamalegos, DO  LORazepam (ATIVAN) 1 MG tablet Take 0.5-1 tablets (0.5-1 mg total) by mouth 2 (two) times daily as needed for anxiety.  08/22/15   Hillary Corinda Gubler, MD  losartan (COZAAR) 50 MG tablet Take 1 tablet (50 mg total) by mouth daily. 05/07/15   Olin Hauser, DO  meclizine (ANTIVERT) 25 MG tablet Take 1-2 tablets (25-50 mg total) by mouth 2 (two) times daily as needed for dizziness. if having symptoms.  May repeat in 12 hours later. 05/07/15   Olin Hauser, DO  metFORMIN (GLUCOPHAGE) 1000 MG tablet Take 1 tablet (1,000 mg total) by mouth 2 (two) times daily with a meal. 08/22/15   Hillary Corinda Gubler, MD  mometasone (NASONEX) 50 MCG/ACT nasal spray Place 2 sprays into the nose daily. 06/07/13   Josalyn Funches, MD  Olopatadine HCl (PATANASE) 0.6 % SOLN 1 drop (1 puff total) by Other route every morning. 06/07/13   Boykin Nearing, MD  ONE TOUCH ULTRA TEST test strip Check blood sugar up to 3 times daily. 06/04/15   Devonne Doughty Karamalegos, DO  pantoprazole (PROTONIX) 40 MG tablet Take 1 tablet (40 mg total) by mouth daily. 05/07/15   Olin Hauser, DO  polyethylene glycol (MIRALAX / GLYCOLAX) packet Take 17 g by mouth every other day.    Historical Provider, MD  sucralfate (CARAFATE) 1 GM/10ML suspension Take 10 mLs (1 g total) by mouth 4 (four) times daily as needed (reflux). 08/22/15   Hillary Corinda Gubler, MD  traMADol (ULTRAM) 50 MG tablet Take 1 tablet (50 mg total) by mouth every 8 (eight) hours as needed for moderate pain or severe pain. 08/22/15   Hillary Corinda Gubler, MD  triamcinolone cream (KENALOG) 0.1 % Apply 1 application topically 2 (two) times daily. For up to 2 weeks then stop. 05/07/15   Olin Hauser, DO    ALLERGIES:  Allergies  Allergen Reactions  . Alcohol Swabs [Isopropyl Alcohol] Nausea Only    Use peroxide instead  . Amoxicillin-Pot Clavulanate Other (See Comments)    REACTION: Unsure - maybe swollen?  Did not agree with her.  . Penicillins Other (See Comments)    REACTION: Unsure what reaction she has.    SOCIAL HISTORY:  Social History   Substance Use Topics  . Smoking status: Current Every Day Smoker    Packs/day: 1.00    Types: Cigarettes  . Smokeless tobacco: Never Used  . Alcohol use 7.0 oz/week    14 Standard drinks or equivalent per week     Comment: beer every 3-4 months     FAMILY HISTORY: Family History  Problem Relation Age of Onset  . Heart attack Father 68  . Hypertension Mother   . Stroke Mother   . Diabetes Mother   . Stomach cancer Mother   . Cancer Mother   . Coronary artery disease      female, 1st degree relative <50  . Stomach cancer Maternal Aunt     EXAM: BP 117/79   Pulse 90  Temp 98.8 F (37.1 C) (Oral)   Resp 18   SpO2 100%  CONSTITUTIONAL: Alert and oriented and responds appropriately to questions. Well-appearing; well-nourished HEAD: Normocephalic EYES: Conjunctivae clear, PERRL ENT: normal nose; no rhinorrhea; moist mucous membranes NECK: Supple, no meningismus, no LAD; no midline spinal TTP or deformity.  CARD: RRR; S1 and S2 appreciated; no murmurs, no clicks, no rubs, no gallops CHEST:  Left chest wall is mildly tender to palpation without crepitus, ecchymosis or deformity. No rashes or lesions noted. RESP: Normal chest excursion without splinting or tachypnea; breath sounds clear and equal bilaterally; no wheezes, no rhonchi, no rales, no hypoxia or respiratory distress, speaking full sentences ABD/GI: Normal bowel sounds; non-distended; soft, non-tender, no rebound, no guarding, no peritoneal signs BACK: The back appears normal and is non-tender to palpation, there is no CVA tenderness EXT: Normal ROM in all joints; no edema; normal capillary refill; no cyanosis, no calf tenderness or swelling. TTP over the left trapezius muscle w/ no bony tenderness or bony deformity; no bony tenderness of the left shoulder. No joint effusion. Normal strength of the bilateral upper extremities. 2+ radial pulses bilaterally. All compartments are soft. Otherwise extremities are non-tender.  Normal range of motion in all joints. SKIN: Normal color for age and race; warm; no rash NEURO: Moves all extremities equally, sensation to light touch intact diffusely, cranial nerves II through XII intact PSYCH: The patient's mood and manner are appropriate. Grooming and personal hygiene are appropriate.  MEDICAL DECISION MAKING: Patient here with left sided trapezius muscle tenderness, strain. No bony tenderness over the left shoulder. Also complaining of left chest pain that has resolved. Was atypical in nature, described as dull and not exertional or pleuritic. She had no associated shortness of breath, nausea, vomiting, dizziness or diaphoresis. EKG shows no new ischemic abnormality. She has had 2 negative troponins and a clear chest x-ray. X-ray also showed that her left shoulder appeared normal. No sign of septic arthritis, gout, fracture or dislocation on exam. Low suspicion that this is ACS. Suspect that her chest pain may have been related to her trapezius muscle strain. I recommended she continue Tylenol, baclofen and tramadol at home. Have recommended close follow-up with her outpatient provider. Discussed with her return precautions. She is comfortable with this plan.  Doubt dissection, PE given she is chest pain-free currently.    EKG Interpretation  Date/Time:  Thursday October 03 2015 21:33:11 EDT Ventricular Rate:  84 PR Interval:  156 QRS Duration: 88 QT Interval:  384 QTC Calculation: 453 R Axis:   23 Text Interpretation:  Normal sinus rhythm Nonspecific ST and T wave abnormality Abnormal ECG No significant change since last tracing Confirmed by WARD,  DO, KRISTEN [phone]) on 10/04/2015 3:13:58 AM       I personally performed the services described in the above documentation, which was scribed in my presence. The recorded information above has been reviewed and is accurate.    [place], DO 10/04/15 [phone]

## 2015-10-04 NOTE — Discharge Instructions (Signed)
You may take Tylenol 1000 mg every 6 hours as needed for pain. You may use your tramadol and baclofen as prescribed for pain control.   Please make an appointment to follow-up with your primary care physician for further outpatient management for your episode of chest pain. You have had 2 negative cardiac labs and a clear chest x-ray. EKG was also reassuring.   To find a primary care or specialty doctor please call [phone] or [phone] to access "[facility] Service."  You may also go on the [facility] website at [email or web address]  There are also multiple [facility] and [facility] practices throughout the [place] that are frequently accepting new patients. You may find a clinic that is close to your home and contact them.  [facility] -  [address] [place] [phone]  [facility] in [place] (also locations in [place] and [place]) -  [place] [address] [address]  [place] [address] [phone]

## 2015-11-14 ENCOUNTER — Other Ambulatory Visit: Payer: Self-pay | Admitting: Internal Medicine

## 2015-11-14 DIAGNOSIS — G6289 Other specified polyneuropathies: Secondary | ICD-10-CM

## 2015-11-15 ENCOUNTER — Ambulatory Visit: Payer: Medicare Other | Admitting: Internal Medicine

## 2015-11-25 ENCOUNTER — Ambulatory Visit: Payer: Medicare Other | Admitting: Internal Medicine

## 2015-11-26 ENCOUNTER — Encounter: Payer: Self-pay | Admitting: Internal Medicine

## 2015-11-26 ENCOUNTER — Ambulatory Visit (INDEPENDENT_AMBULATORY_CARE_PROVIDER_SITE_OTHER): Payer: Medicare Other | Admitting: Internal Medicine

## 2015-11-26 VITALS — BP 137/76 | HR 96 | Temp 98.6°F | Wt 176.0 lb

## 2015-11-26 DIAGNOSIS — M25551 Pain in right hip: Secondary | ICD-10-CM

## 2015-11-26 DIAGNOSIS — M25552 Pain in left hip: Secondary | ICD-10-CM

## 2015-11-26 DIAGNOSIS — E1140 Type 2 diabetes mellitus with diabetic neuropathy, unspecified: Secondary | ICD-10-CM | POA: Diagnosis not present

## 2015-11-26 DIAGNOSIS — F411 Generalized anxiety disorder: Secondary | ICD-10-CM

## 2015-11-26 LAB — POCT GLYCOSYLATED HEMOGLOBIN (HGB A1C): HEMOGLOBIN A1C: 8.8

## 2015-11-26 MED ORDER — LORAZEPAM 0.5 MG PO TABS: 0.5000 mg | ORAL_TABLET | Freq: Two times a day (BID) | ORAL | 2 refills | Status: DC | PRN

## 2015-11-26 MED ORDER — METFORMIN HCL 1000 MG PO TABS: 1000.0000 mg | ORAL_TABLET | Freq: Two times a day (BID) | ORAL | 2 refills | Status: DC

## 2015-11-26 MED ORDER — GLIPIZIDE 10 MG PO TABS: 10.0000 mg | ORAL_TABLET | Freq: Every day | ORAL | 2 refills | Status: DC

## 2015-11-26 MED ORDER — DICLOFENAC SODIUM 1 % TD GEL: 2.0000 g | Freq: Four times a day (QID) | TRANSDERMAL | 1 refills | Status: DC

## 2015-11-26 NOTE — Progress Notes (Signed)
[name]  Subjective:  [name] is a 65 y.o. female who presents for diabetes follow-up.  T2DM: - Last CBGs 289 in the pm, 187 in the am - Takes metformin 1000 mg BID and glipizide 10 mg daily - Says her downfall is sweets - Has not been exercising regularly but likes to walk - Is under a lot of stress caring for her husband who has dementia and bladder cancer - She takes 600-900 mg gabapentin BID for neuropathy and feels this is well controlled - On losartan ROS: No abdominal pain, no dizziness  Hip pain: - Has been taking tramadol up to 3 x a day, baclofen, and tylenol for R hip piriformis syndrome  Social: Current smoker  Objective: Blood pressure 137/76, pulse 96, temperature 98.6 F (37 C), temperature source Oral, weight 176 lb (79.8 kg). Body mass index is 28.41 kg/m. Constitutional: Overweight female, in NAD Cardiovascular: RRR, S1, S2, no m/r/g.  Pulmonary/Chest: Effort normal and breath sounds normal. No respiratory distress.  Musculoskeletal: Walks slowly.  Psychiatric: Normal mood and affect.  Vitals reviewed  Assessment/Plan: Diabetes mellitus with neuropathy ([facility]) - hgb A1c elevated today at 8.8 from 8.4 at last visit 3 months ago - Strongly encouraged making medication changes today but patient prefers to try to lose weight and decrease sweets; she is opposed to starting insulin. Does not want to meet with nutritionist at this time. Recommended increasing metformin to 850 TID and starting a GLP-1 agonist, but patient refused at this time.  - Counseled patient on importance of tighter diabetes control to prevent long-term complications - Would continue to recommend escalating diabetes regimen if hgb A1c remains elevated - Recommended trying to incorporate daily exercise into her routine, such as taking a walk after largest meal of the day.   Hip pain - Recommended voltaren gel to try to decrease use of tramadol. Would  like to taper pt off of this.   Follow-up early next year for repeat hgb A1c and foot exam. Would also get CMET at that time to f/u fatty liver disease.   [name], MD [place], PGY-2

## 2015-11-26 NOTE — Patient Instructions (Signed)
[name],  Your blood sugar is not well controlled. I recommend decreasing sweets and incorporating some daily exercise. If your hemoglobin A1c is still high at next visit in February, I will strongly recommend medication changes.  For hip pain, try applying voltaren gel (anti-inflammatory) to the area up to 4 times a day. I hope this can decrease how often you need to take tramadol.  I would also like to discuss anxiety in more detail. Please make an appointment at your convenience.  Best, Dr. Ola Spurr

## 2015-11-28 ENCOUNTER — Other Ambulatory Visit: Payer: Self-pay | Admitting: Internal Medicine

## 2015-11-28 DIAGNOSIS — M25559 Pain in unspecified hip: Secondary | ICD-10-CM | POA: Insufficient documentation

## 2015-11-28 DIAGNOSIS — E1140 Type 2 diabetes mellitus with diabetic neuropathy, unspecified: Secondary | ICD-10-CM

## 2015-11-28 DIAGNOSIS — I10 Essential (primary) hypertension: Secondary | ICD-10-CM

## 2015-11-28 MED ORDER — ATENOLOL 25 MG PO TABS: ORAL_TABLET | ORAL | 2 refills | Status: DC

## 2015-11-28 MED ORDER — METFORMIN HCL 1000 MG PO TABS: 1000.0000 mg | ORAL_TABLET | Freq: Two times a day (BID) | ORAL | 2 refills | Status: DC

## 2015-11-28 MED ORDER — GLIPIZIDE 10 MG PO TABS: 10.0000 mg | ORAL_TABLET | Freq: Every day | ORAL | 2 refills | Status: DC

## 2015-11-28 MED ORDER — ONETOUCH ULTRASOFT LANCETS MISC: 5 refills | Status: DC

## 2015-11-28 NOTE — Assessment & Plan Note (Signed)
-   Recommended voltaren gel to try to decrease use of tramadol. Would like to taper pt off of this.

## 2015-11-28 NOTE — Assessment & Plan Note (Signed)
-   hgb A1c elevated today at 8.8 from 8.4 at last visit 3 months ago - Strongly encouraged making medication changes today but patient prefers to try to lose weight and decrease sweets; she is opposed to starting insulin. Does not want to meet with nutritionist at this time. Recommended increasing metformin to 850 TID and starting a GLP-1 agonist, but patient refused at this time.  - Counseled patient on importance of tighter diabetes control to prevent long-term complications - Would continue to recommend escalating diabetes regimen if hgb A1c remains elevated - Recommended trying to incorporate daily exercise into her routine, such as taking a walk after largest meal of the day.

## 2015-12-03 ENCOUNTER — Other Ambulatory Visit: Payer: Self-pay | Admitting: *Deleted

## 2015-12-03 DIAGNOSIS — E1140 Type 2 diabetes mellitus with diabetic neuropathy, unspecified: Secondary | ICD-10-CM

## 2015-12-04 MED ORDER — ATORVASTATIN CALCIUM 40 MG PO TABS: 40.0000 mg | ORAL_TABLET | Freq: Every day | ORAL | 2 refills | Status: DC

## 2015-12-26 ENCOUNTER — Encounter: Payer: Self-pay | Admitting: Podiatry

## 2015-12-26 ENCOUNTER — Ambulatory Visit (INDEPENDENT_AMBULATORY_CARE_PROVIDER_SITE_OTHER): Payer: Medicare Other | Admitting: Podiatry

## 2015-12-26 VITALS — Ht 66.0 in | Wt 176.0 lb

## 2015-12-26 DIAGNOSIS — E1140 Type 2 diabetes mellitus with diabetic neuropathy, unspecified: Secondary | ICD-10-CM

## 2015-12-26 DIAGNOSIS — B351 Tinea unguium: Secondary | ICD-10-CM

## 2015-12-26 NOTE — Progress Notes (Signed)
Patient ID: [name], female   DOB: 08/21/1950, 65 y.o.   MRN: [id number] Complaint:  Visit Type: Patient returns to my office for continued preventative foot care services. Complaint: Patient states" my nails have grown long and thick and become painful to walk and wear shoes" Patient has been diagnosed with DM with neuropathy.. The patient presents for preventative foot care services. No changes to ROS  Podiatric Exam: Vascular: dorsalis pedis and posterior tibial pulses are palpable bilateral. Capillary return is immediate. Temperature gradient is WNL. Skin turgor WNL  Sensorium: Diminished  Semmes Weinstein monofilament test. Normal tactile sensation bilaterally. Nail Exam: Pt has thick disfigured discolored nails with subungual debris noted bilateral entire nail hallux through fifth toenails Ulcer Exam: There is no evidence of ulcer or pre-ulcerative changes or infection. Orthopedic Exam: Muscle tone and strength are WNL. No limitations in general ROM. No crepitus or effusions noted. Foot type and digits show no abnormalities. Bony prominences are unremarkable. Skin: No Porokeratosis. No infection or ulcers  Diagnosis:  Onychomycosis, , Pain in right toe, pain in left toes  Treatment & Plan Procedures and Treatment: Consent by patient was obtained for treatment procedures. The patient understood the discussion of treatment and procedures well. All questions were answered thoroughly reviewed. Debridement of mycotic and hypertrophic toenails, 1 through 5 bilateral and clearing of subungual debris. No ulceration, no infection noted.  Return Visit-Office Procedure: Patient instructed to return to the office for a follow up visit 3 months for continued evaluation and treatment.    Gardiner Barefoot DPM

## 2015-12-27 LAB — HM DIABETES EYE EXAM

## 2015-12-31 ENCOUNTER — Other Ambulatory Visit: Payer: Self-pay | Admitting: *Deleted

## 2015-12-31 DIAGNOSIS — M62830 Muscle spasm of back: Secondary | ICD-10-CM

## 2016-01-01 ENCOUNTER — Other Ambulatory Visit: Payer: Self-pay | Admitting: Family Medicine

## 2016-01-01 DIAGNOSIS — M545 Low back pain, unspecified: Secondary | ICD-10-CM

## 2016-01-01 DIAGNOSIS — M62830 Muscle spasm of back: Secondary | ICD-10-CM

## 2016-01-01 MED ORDER — BACLOFEN 10 MG PO TABS: 10.0000 mg | ORAL_TABLET | Freq: Three times a day (TID) | ORAL | 1 refills | Status: DC | PRN

## 2016-01-01 NOTE — Telephone Encounter (Signed)
2nd request.  Quintella Mura L, RN  

## 2016-02-21 ENCOUNTER — Other Ambulatory Visit: Payer: Self-pay | Admitting: *Deleted

## 2016-02-21 DIAGNOSIS — F411 Generalized anxiety disorder: Secondary | ICD-10-CM

## 2016-02-21 NOTE — Telephone Encounter (Signed)
Pt states she needs her lorazepam filled today. ep

## 2016-02-24 ENCOUNTER — Emergency Department (HOSPITAL_COMMUNITY)
Admission: EM | Admit: 2016-02-24 | Discharge: 2016-02-24 | Disposition: A | Discharge status: Home / Self Care | Payer: Medicare Other | Attending: Emergency Medicine | Admitting: Emergency Medicine

## 2016-02-24 ENCOUNTER — Telehealth: Payer: Self-pay | Admitting: Internal Medicine

## 2016-02-24 ENCOUNTER — Encounter (HOSPITAL_COMMUNITY): Payer: Self-pay | Admitting: *Deleted

## 2016-02-24 DIAGNOSIS — I110 Hypertensive heart disease with heart failure: Secondary | ICD-10-CM | POA: Insufficient documentation

## 2016-02-24 DIAGNOSIS — Z7982 Long term (current) use of aspirin: Secondary | ICD-10-CM | POA: Insufficient documentation

## 2016-02-24 DIAGNOSIS — I509 Heart failure, unspecified: Secondary | ICD-10-CM | POA: Insufficient documentation

## 2016-02-24 DIAGNOSIS — H9202 Otalgia, left ear: Secondary | ICD-10-CM | POA: Diagnosis not present

## 2016-02-24 DIAGNOSIS — F17210 Nicotine dependence, cigarettes, uncomplicated: Secondary | ICD-10-CM | POA: Diagnosis not present

## 2016-02-24 DIAGNOSIS — E1140 Type 2 diabetes mellitus with diabetic neuropathy, unspecified: Secondary | ICD-10-CM | POA: Diagnosis not present

## 2016-02-24 DIAGNOSIS — F419 Anxiety disorder, unspecified: Secondary | ICD-10-CM

## 2016-02-24 DIAGNOSIS — Z7984 Long term (current) use of oral hypoglycemic drugs: Secondary | ICD-10-CM | POA: Insufficient documentation

## 2016-02-24 DIAGNOSIS — R002 Palpitations: Secondary | ICD-10-CM | POA: Diagnosis present

## 2016-02-24 MED ORDER — NAPROXEN 250 MG PO TABS
500.0000 mg | ORAL_TABLET | Freq: Once | ORAL | Status: AC
  Administered 2016-02-24: 500 mg via ORAL
  Filled 2016-02-24: qty 2

## 2016-02-24 MED ORDER — LORAZEPAM 0.5 MG PO TABS
0.5000 mg | ORAL_TABLET | Freq: Once | ORAL | Status: AC
  Administered 2016-02-24: 0.5 mg via ORAL
  Filled 2016-02-24: qty 1

## 2016-02-24 NOTE — Telephone Encounter (Signed)
New message ° °Pt call requesting to speak with RN. Pt did not want to disclose any further information. Please call back to discuss  °

## 2016-02-24 NOTE — ED Triage Notes (Signed)
Pt is here for 2 things.  Pain in her left neck, under jaw which feels like "a catch'.  Pt also has been having increased anxiety because she is out of her lorazepam and her MD didn't call it in and pt would like a refill.  No CP with this.

## 2016-02-24 NOTE — Telephone Encounter (Signed)
Pt states she is has been out of this medication and is having panic attacks and needs at least enough to last until appointment on Thursday. Pt would like PCP to call her. Please advise. ep

## 2016-02-24 NOTE — ED Provider Notes (Signed)
[facility] DEPT Provider Note   CSN: [id number] Arrival date & time: 02/24/16  0024  By signing my name below, I, Ethelle Lyon Long, attest that this documentation has been prepared under the direction and in the presence of Merryl Hacker, MD . Electronically Signed: Ethelle Lyon Long, Scribe. 02/24/2016. 2:28 AM.    History   Chief Complaint Chief Complaint  Patient presents with  . Neck Pain  . Medication Refill    The history is provided by the patient. No language interpreter was used.    HPI Comments:  [name] is a 66 y.o. female with a PMHx of Anxiety, Ashtma, CHF, DM, HLD, HTN, and Palpitations, who presents to the Emergency Department complaining of mild left-sided facial pain onset 10:30PM. She notes she was awake at home when the facial pain arose. She currently rates the pain as a 4/10 and states the pain feels like "a catch". She has associated symptoms of rhinorrhea and panic attacks beginning two days ago s/p running out of her Lorazepam. She also has resolved, subjective, palpitations measured at 113bpm at home. Pt states she needs a refill of her Lorazepam. She did not take anything for Tx of her jaw pain. She notes no movement exacerbates or alleviates her pain. Pt denies CP, SOB, ear pain, fever, difficulty swallowing, dental problem, and any other associated symptoms at this time.   Past Medical History:  Diagnosis Date  . Allergy   . Anxiety   . AR (allergic rhinitis)   . Asthma   . Cataract   . CHF (congestive heart failure) ([place])   . Colon polyps    hyperplastic  . Disturbance of skin sensation    facial paresthesia; left  . DM2 (diabetes mellitus, type 2) ([place])   . Dyslipidemia   . GERD (gastroesophageal reflux disease)   . Hemorrhoids   . Hyperlipidemia   . Hypertension    essential, benign   . Insomnia   . Menopausal syndrome   . Palpitations    hx  . Routine general medical examination at a health care facility   . Screening for  malignant neoplasm of the cervix   . Skin lesion   . SVT (supraventricular tachycardia) ([place])   . Tobacco abuse   . Weakness     Patient Active Problem List   Diagnosis Date Noted  . Hip pain 11/28/2015  . Sebaceous cyst 06/04/2015  . Eczema 05/07/2015  . Pilonidal cyst 01/30/2015  . Fatty infiltration of liver 05/22/2014  . Chronic low back pain 05/21/2014  . S/P ablation of atrial flutter 01/09/2014  . Screening for malignant neoplasm of the cervix 12/01/2013  . Peripheral neuropathy ([place]) 12/01/2013  . Screen for STD (sexually transmitted disease) 06/07/2013  . Dysphagia 02/24/2013  . Poor sleep hygiene 12/27/2012  . Extrinsic asthma 09/29/2012  . Chronic allergic conjunctivitis 09/29/2012  . Onychomycosis 06/10/2012  . Overweight 08/16/2008  . TOBACCO ABUSE 11/03/2007  . Essential hypertension, benign 10/18/2007  . Anxiety state 10/03/2007  . Diabetes mellitus with neuropathy ([place]) 08/18/2007  . DYSLIPIDEMIA 02/17/2007  . Allergic rhinitis 07/21/2006  . GERD 07/21/2006    Past Surgical History:  Procedure Laterality Date  . ABDOMINAL HYSTERECTOMY  1990   For fibroids benign.  Still has ovaries  . ablation for SVT  9/09   Dr. Lovena Le  . APPENDECTOMY    . COLONOSCOPY  2009  . dental extractions    . SIGMOIDOSCOPY  2004    OB History  No data available       Home Medications    Prior to Admission medications   Medication Sig Start Date End Date Taking? Authorizing Provider  acetaminophen (TYLENOL) 325 MG tablet Take 650 mg by mouth every 6 (six) hours as needed for moderate pain.    Historical Provider, MD  aspirin 81 MG chewable tablet Chew 1 tablet (81 mg total) by mouth daily. 06/07/13   Josalyn Funches, MD  atenolol (TENORMIN) 25 MG tablet TAKE 1 TABLET(S) BY MOUTH DAILY 11/28/15   Rogue Bussing, MD  atorvastatin (LIPITOR) 40 MG tablet Take 1 tablet (40 mg total) by mouth daily. 12/04/15   Hillary Corinda Gubler, MD  baclofen (LIORESAL) 10  MG tablet Take 1 tablet (10 mg total) by mouth 3 (three) times daily as needed for muscle spasms. 01/01/16   Hillary Corinda Gubler, MD  diclofenac sodium (VOLTAREN) 1 % GEL Apply 2 g topically 4 (four) times daily. 11/26/15   Hillary Corinda Gubler, MD  gabapentin (NEURONTIN) 300 MG capsule Take 2-3 capsules (600-900 mg total) by mouth 2 (two) times daily as needed. 05/07/15   Devonne Doughty Karamalegos, DO  glipiZIDE (GLUCOTROL) 10 MG tablet Take 1 tablet (10 mg total) by mouth daily. 11/28/15   Hillary Corinda Gubler, MD  hydrochlorothiazide (MICROZIDE) 12.5 MG capsule Take 1 capsule (12.5 mg total) by mouth daily. 05/07/15   Olin Hauser, DO  Lancets (ONETOUCH ULTRASOFT) lancets CHECK BLOOD SUGAR 3 TIMES DAILY. 11/28/15   Hillary Corinda Gubler, MD  LORazepam (ATIVAN) 0.5 MG tablet Take 1 tablet (0.5 mg total) by mouth 2 (two) times daily as needed for anxiety. 11/26/15   Hillary Corinda Gubler, MD  losartan (COZAAR) 50 MG tablet Take 1 tablet (50 mg total) by mouth daily. 05/07/15   Olin Hauser, DO  meclizine (ANTIVERT) 25 MG tablet Take 1-2 tablets (25-50 mg total) by mouth 2 (two) times daily as needed for dizziness. if having symptoms.  May repeat in 12 hours later. 05/07/15   Olin Hauser, DO  metFORMIN (GLUCOPHAGE) 1000 MG tablet Take 1 tablet (1,000 mg total) by mouth 2 (two) times daily with a meal. 11/28/15   Hillary Corinda Gubler, MD  mometasone (NASONEX) 50 MCG/ACT nasal spray Place 2 sprays into the nose daily. 06/07/13   Josalyn Funches, MD  Olopatadine HCl (PATANASE) 0.6 % SOLN 1 drop (1 puff total) by Other route every morning. 06/07/13   Boykin Nearing, MD  ONE TOUCH ULTRA TEST test strip Check blood sugar up to 3 times daily. 06/04/15   Devonne Doughty Karamalegos, DO  pantoprazole (PROTONIX) 40 MG tablet Take 1 tablet (40 mg total) by mouth daily. 05/07/15   Olin Hauser, DO  polyethylene glycol (MIRALAX / GLYCOLAX) packet Take 17 g by mouth  every other day.    Historical Provider, MD  sucralfate (CARAFATE) 1 GM/10ML suspension Take 10 mLs (1 g total) by mouth 4 (four) times daily as needed (reflux). 08/22/15   Hillary Corinda Gubler, MD  traMADol (ULTRAM) 50 MG tablet Take 1 tablet (50 mg total) by mouth every 8 (eight) hours as needed for moderate pain or severe pain. 08/22/15   Hillary Corinda Gubler, MD  triamcinolone cream (KENALOG) 0.1 % Apply 1 application topically 2 (two) times daily. For up to 2 weeks then stop. 05/07/15   Olin Hauser, DO    Family History Family History  Problem Relation Age of Onset  . Heart attack Father 59  . Hypertension Mother   .  Stroke Mother   . Diabetes Mother   . Stomach cancer Mother   . Cancer Mother   . Coronary artery disease      female, 1st degree relative <50  . Stomach cancer Maternal Aunt     Social History Social History  Substance Use Topics  . Smoking status: Current Every Day Smoker    Packs/day: 1.00    Types: Cigarettes  . Smokeless tobacco: Never Used  . Alcohol use 7.0 oz/week    14 Standard drinks or equivalent per week     Comment: beer every 3-4 months      Allergies   Alcohol swabs [isopropyl alcohol]; Amoxicillin-pot clavulanate; and Penicillins   Review of Systems Review of Systems  Constitutional: Negative for fever.  HENT: Positive for congestion and rhinorrhea. Negative for dental problem, ear pain and trouble swallowing.   Respiratory: Negative for shortness of breath.   Cardiovascular: Positive for palpitations (resolved). Negative for chest pain.  Musculoskeletal: Positive for arthralgias. Negative for neck pain.  Psychiatric/Behavioral: The patient is nervous/anxious (panic attacks).   All other systems reviewed and are negative.    Physical Exam Updated Vital Signs BP 116/72   Pulse 66   Temp 98.2 F (36.8 C) (Oral)   Resp 11   SpO2 97%   Physical Exam  Constitutional: She is oriented to person, place, and time. She  appears well-developed and well-nourished. No distress.  HENT:  Head: Normocephalic and atraumatic.  Mouth/Throat: Oropharynx is clear and moist.  Effusion with bulging of the left TM, no significant erythema, normal bite, dentures in place, no tenderness along the jaw, no tenderness over the TMJ, no redness or overlying skin changes, no tenderness over the mastoid  Eyes: Pupils are equal, round, and reactive to light.  Neck: Normal range of motion. Neck supple.  Cardiovascular: Normal rate, regular rhythm and normal heart sounds.   Pulmonary/Chest: Effort normal. No respiratory distress. She has no wheezes.  Abdominal: Soft. Bowel sounds are normal. There is no tenderness. There is no guarding.  Neurological: She is alert and oriented to person, place, and time.  Skin: Skin is warm and dry.  Psychiatric: She has a normal mood and affect.  Nursing note and vitals reviewed.    ED Treatments / Results  DIAGNOSTIC STUDIES:  Oxygen Saturation is 98% on RA, normal by my interpretation.    COORDINATION OF CARE:  2:16 AM Discussed treatment plan with pt at bedside including one dose of Lorazepam with f/u with her PCP for refill of Rx, an EKG, and an increase of nasal saline and pt agreed to plan.  Labs (all labs ordered are listed, but only abnormal results are displayed) Labs Reviewed - No data to display  EKG  EKG Interpretation  Date/Time:  Monday February 24 2016 02:25:36 EST Ventricular Rate:  70 PR Interval:    QRS Duration: 102 QT Interval:  454 QTC Calculation: 490 R Axis:   30 Text Interpretation:  Sinus rhythm Borderline T abnormalities, anterior leads Borderline prolonged QT interval No significant change since last tracing Confirmed by HORTON  MD, Loma Sousa ([id number]) on 02/24/2016 3:19:10 AM       Radiology No results found.  Procedures Procedures (including critical care time)  Medications Ordered in ED Medications  LORazepam (ATIVAN) tablet 0.5 mg (0.5 mg Oral  Given 02/24/16 0242)  naproxen (NAPROSYN) tablet 500 mg (500 mg Oral Given 02/24/16 0242)     Initial Impression / Assessment and Plan / ED Course  I have  reviewed the triage vital signs and the nursing notes.  Pertinent labs & imaging results that were available during my care of the patient were reviewed by me and considered in my medical decision making (see chart for details).     Patient presents with 2 complaints. Reports left facial pain but also reports anxiety in the setting of running out of her medications. She is nontoxic. She does have an effusion on the left ear. No signs of cellulitis, dental infection, mastoiditis. Suspect she may have referred pain.  Regarding her anxiety. She was given one dose of anxiety medication here.  Follow-up with her primary physician for further medication refills. I have encouraged to continue nasal saline and nasal steroid to drain the middle ear.  After history, exam, and medical workup I feel the patient has been appropriately medically screened and is safe for discharge home. Pertinent diagnoses were discussed with the patient. Patient was given return precautions.   Final Clinical Impressions(s) / ED Diagnoses   Final diagnoses:  Left ear pain  Anxiety    New Prescriptions New Prescriptions   No medications on file   I personally performed the services described in this documentation, which was scribed in my presence. The recorded information has been reviewed and is accurate.     Merryl Hacker, MD 02/24/16 [phone]

## 2016-02-24 NOTE — Telephone Encounter (Signed)
Please let patient know this is a controlled substance and needs a visit for refills. Thank you.

## 2016-02-24 NOTE — Telephone Encounter (Signed)
Called patient to discuss ativan prescription. Patient was frustrated and felt ED visit this morning was due in part to not getting ativan prescription on Friday as requested. Explained prescription requests do not get routed to me immediately and that in general my practice is to provide paper prescriptions for controlled substances. She has been taking 2 pills daily despite the prescription being written as prn and has run out. I explained that ativan is not a good long-term solution for anxiety and that my plan is to work with her to taper the dose. She plans to keep appointment this Thursday, 2/15, to discuss this further. I called in ativan 0.5 mg BID prn (#60) with no refills.   [name], MD [place], PGY-2

## 2016-02-24 NOTE — Discharge Instructions (Signed)
You were seen today for left-sided facial pain. This may be related to some fluid behind her left ear. Continue nasal saline and nasal steroid to drain the middle ear. Take ibuprofen as needed for pain. Follow-up with your primary physician for refills of your anxiety medication.

## 2016-02-24 NOTE — Telephone Encounter (Signed)
Called patient.  Patient says that she would like for Dr Lovena Le to prescribe her lorazepam.  She stated that her PCP has been prescribing this medication for her.  She stated that she had contacted her PCP but she will not refill without and appt.  She was given an appt. for this coming Thursday.  I explained that this isn't a normal cardiology medication.  I advised her to keep the appt with her PCP on Thursday.

## 2016-02-27 ENCOUNTER — Ambulatory Visit (INDEPENDENT_AMBULATORY_CARE_PROVIDER_SITE_OTHER): Payer: Medicare Other | Admitting: Internal Medicine

## 2016-02-27 VITALS — BP 134/70 | HR 87 | Temp 98.9°F | Ht 66.0 in | Wt 175.0 lb

## 2016-02-27 DIAGNOSIS — F411 Generalized anxiety disorder: Secondary | ICD-10-CM

## 2016-02-27 DIAGNOSIS — G6289 Other specified polyneuropathies: Secondary | ICD-10-CM

## 2016-02-27 DIAGNOSIS — K219 Gastro-esophageal reflux disease without esophagitis: Secondary | ICD-10-CM

## 2016-02-27 DIAGNOSIS — R42 Dizziness and giddiness: Secondary | ICD-10-CM

## 2016-02-27 DIAGNOSIS — I10 Essential (primary) hypertension: Secondary | ICD-10-CM

## 2016-02-27 DIAGNOSIS — E1140 Type 2 diabetes mellitus with diabetic neuropathy, unspecified: Secondary | ICD-10-CM | POA: Diagnosis not present

## 2016-02-27 LAB — POCT GLYCOSYLATED HEMOGLOBIN (HGB A1C): Hemoglobin A1C: 8.6

## 2016-02-27 MED ORDER — HYDROCHLOROTHIAZIDE 12.5 MG PO CAPS: 12.5000 mg | ORAL_CAPSULE | Freq: Every day | ORAL | 2 refills | Status: DC

## 2016-02-27 MED ORDER — ASPIRIN 81 MG PO CHEW: 81.0000 mg | CHEWABLE_TABLET | Freq: Every day | ORAL | 0 refills | Status: DC

## 2016-02-27 MED ORDER — MECLIZINE HCL 25 MG PO TABS: 25.0000 mg | ORAL_TABLET | Freq: Two times a day (BID) | ORAL | 2 refills | Status: DC | PRN

## 2016-02-27 MED ORDER — ATORVASTATIN CALCIUM 40 MG PO TABS: 40.0000 mg | ORAL_TABLET | Freq: Every day | ORAL | 2 refills | Status: DC

## 2016-02-27 MED ORDER — PANTOPRAZOLE SODIUM 40 MG PO TBEC: 40.0000 mg | DELAYED_RELEASE_TABLET | Freq: Every day | ORAL | 2 refills | Status: DC

## 2016-02-27 MED ORDER — GLIPIZIDE 10 MG PO TABS: 10.0000 mg | ORAL_TABLET | Freq: Every day | ORAL | 2 refills | Status: DC

## 2016-02-27 MED ORDER — METFORMIN HCL 1000 MG PO TABS: 1000.0000 mg | ORAL_TABLET | Freq: Two times a day (BID) | ORAL | 2 refills | Status: DC

## 2016-02-27 MED ORDER — TRAMADOL HCL 50 MG PO TABS: 50.0000 mg | ORAL_TABLET | Freq: Three times a day (TID) | ORAL | 2 refills | Status: DC | PRN

## 2016-02-27 MED ORDER — LOSARTAN POTASSIUM 50 MG PO TABS: 50.0000 mg | ORAL_TABLET | Freq: Every day | ORAL | 2 refills | Status: DC

## 2016-02-27 MED ORDER — ATENOLOL 25 MG PO TABS: ORAL_TABLET | ORAL | 2 refills | Status: DC

## 2016-02-27 MED ORDER — GABAPENTIN 300 MG PO CAPS: 600.0000 mg | ORAL_CAPSULE | Freq: Two times a day (BID) | ORAL | 2 refills | Status: DC | PRN

## 2016-02-27 NOTE — Assessment & Plan Note (Signed)
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 Did not provide another refill at today's visit. Told patient my general practice is not to prescribe controlled substances without a visit/paper prescription and that it is her responsibility to make an appointment before running out.  - Discussed plan to taper ativan and recommended starting an SSRI (lexapro). Discussed risks of prolonged use of benzodiazepines (especially in combination with pain medication). Counseled patient that prolonged use of ativan was neither standard of care nor good care for anxiety or panic attacks. She continued to express her desire to remain on ativan and does not want to try alternative treatments at this time. - If patient returns for follow-up, explained that I will provided fewer and fewer pills at each visit in attempt to wean her off safely

## 2016-02-27 NOTE — Assessment & Plan Note (Signed)
-   Stable with A1c 8.6, slightly improved from 8.8 three months ago. Not at goal of hgb A1c < 7.5 - On metformin 1000 mg BID and glipizide 10 mg daily - Plan at last visit was for patient to make lifestyle changes, as she did not want to make any medication changes at that time - Patient has lost 1 lb. She declines changes to medications, including increasing metformin to 850 mg TID

## 2016-02-27 NOTE — Patient Instructions (Signed)
Ms. Dircks,  Please make an appointment with me before March 9th to discuss further refills on ativan. I will prescribe fewer pills at next visit.  Best, Dr. Ola Spurr

## 2016-02-27 NOTE — Progress Notes (Signed)
[facility] Progress Note  Subjective:  [name] is a 66 y.o. female with history of anxiety, arthritis, HLD, CHF, and tobacco abuse who presents to discuss ativan use and for diabetes check-up.  Anxiety: - Patient ran out of ativan over the weekend and went to the ED Monday morning for jaw-catching pain and because her heart rate was in the 110s when she checked at home. EKG at the time showed mildly prolonged QTc at 490 but no tachycardia (HR 70) or ST changes. She was given 1 dose of ativan and told to follow-up with her PCP.  - Patient requested ativan be called in Friday. Says she did not realize she was out until then because she usually keeps some in her purse and some in her pill box.  - Patient states she has been on ativan for 10-12 years after it was given to her in the ED following a choking episode that happened at work (choked on a piece of hard candy) - Medication is prescribed as 0.5 mg BID as needed but patient taking twice daily every day - Describes her anxiety as being easily startled and jumpy ROS: No chest pain, did have sensation of palpitations over weekend  T2DM: - Did not address in detail, as primarily discussed ativan use - Patient endorses tingling/numbness in balls of her feet bilaterally - Gabapentin helps some - Takes metformin 1000 mg BID and glipizide 10 mg daily - On losartan  Objective: Blood pressure 134/70, pulse 87, temperature 98.9 F (37.2 C), temperature source Oral, height 5\' 6"  (1.676 m), weight 175 lb (79.4 kg), SpO2 99 %. 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 Did not provide another refill at today's visit. Told patient my general practice is not to prescribe controlled substances without a visit/paper prescription and that it is her responsibility to make an appointment before running out.  - Discussed plan to taper ativan and recommended starting an SSRI (lexapro). Discussed risks of prolonged use of benzodiazepines (especially in combination with pain medication). Counseled patient that prolonged use of ativan was neither standard of care nor good care for anxiety or panic attacks. She continued to express her desire to remain on ativan and does not want to try alternative treatments at this time. - If patient returns for follow-up, explained that I will provided fewer and fewer pills at each visit in attempt to wean her off safely  Diabetes mellitus with neuropathy ([place]) - Stable with A1c 8.6, slightly improved from 8.8 three months ago. Not at goal of hgb A1c < 7.5 - On metformin 1000 mg BID and glipizide 10 mg daily - Plan at last visit was for patient to make lifestyle changes, as she did not want to make any medication changes at that time - Patient has lost 1 lb. She declines changes to medications, including increasing metformin to 850 mg TID  Follow-up in 1 month for ativan refill. Current plan would be to decrease amount prescribed over the next 4 months--#45 pills, #30 pills, #20 pills, #10 pills--counseling patient to take  only as needed and halve tablets for smaller doses. Could also consider switching to longer acting benzodiazepine to try to reduce risk of withdrawal. Will plan to offer behavioral health counseling at next visit, as well.  Precepted this encounter with Attending Dr. Erin Hearing.   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 I offered for attending to speak with patient; she said she wanted to. Dr. Erin Hearing plans to reach out to patient by phone.   [name], MD [place], PGY-2

## 2016-02-28 ENCOUNTER — Telehealth: Payer: Self-pay | Admitting: Family Medicine

## 2016-02-28 NOTE — Telephone Encounter (Signed)
Called home # and left VM - will try cell and then retry this   Called mobile   Spoke with her  She was upset that a medicine she had been taking for 15 years was being changed.   She felt threatened about losing her medication. She felt that Dr Ola Spurr was in a rush.  She felt that she and Dr Ola Spurr were both strong headed women and had a difficult interaction   I explained that I completely agreed with Dr Wanda Plump that this mediation at the current dose was not good care and that it should be weaned  Also suggested she and Dr F discuss their interaction next visit  She had filled out a change of Dr Haynes Hoehn.  I asked if she wanted to proceed with this.    She related she would like to see Dr F at least one more time  I stated that if she did change physicians the plan about her medications would continue  She was thankful for the call

## 2016-03-19 ENCOUNTER — Encounter: Payer: Self-pay | Admitting: Licensed Clinical Social Worker

## 2016-03-19 ENCOUNTER — Encounter: Payer: Self-pay | Admitting: Internal Medicine

## 2016-03-19 ENCOUNTER — Other Ambulatory Visit: Payer: Self-pay | Admitting: Family Medicine

## 2016-03-19 ENCOUNTER — Ambulatory Visit (INDEPENDENT_AMBULATORY_CARE_PROVIDER_SITE_OTHER): Payer: Medicare Other | Admitting: Internal Medicine

## 2016-03-19 VITALS — BP 108/60 | HR 78 | Temp 98.4°F | Ht 66.0 in | Wt 175.0 lb

## 2016-03-19 DIAGNOSIS — R42 Dizziness and giddiness: Secondary | ICD-10-CM | POA: Diagnosis not present

## 2016-03-19 DIAGNOSIS — G479 Sleep disorder, unspecified: Secondary | ICD-10-CM

## 2016-03-19 DIAGNOSIS — F411 Generalized anxiety disorder: Secondary | ICD-10-CM

## 2016-03-19 DIAGNOSIS — R252 Cramp and spasm: Secondary | ICD-10-CM | POA: Diagnosis not present

## 2016-03-19 DIAGNOSIS — M791 Myalgia, unspecified site: Secondary | ICD-10-CM

## 2016-03-19 DIAGNOSIS — E118 Type 2 diabetes mellitus with unspecified complications: Secondary | ICD-10-CM | POA: Diagnosis not present

## 2016-03-19 MED ORDER — ONETOUCH ULTRASOFT LANCETS MISC: 12 refills | Status: DC

## 2016-03-19 MED ORDER — LORAZEPAM 0.5 MG PO TABS: 0.5000 mg | ORAL_TABLET | Freq: Two times a day (BID) | ORAL | 0 refills | Status: DC | PRN

## 2016-03-19 NOTE — Progress Notes (Addendum)
[name] is a 66 y.o. female    Dr. Clydene Pugh requested [name] for the following:  anxiety. Discussed services offered by [facility] with patient, verbal consent received.  Pt. reports the following symptoms/concerns: shortness of breath, racing heart and sweating    Duration of current symptoms/ problem: on and off for years Age of onset :about 15 years ago Previous out/inpatient treatment: no  LIFE CONTEXT:  Family & Social: is care taker of husband who has demential, has family support  Higher education careers adviser Work:  retired  Life changes:  None reported What is important to pt: patient enjoys traveling  [place] DISCUSSED:Current stressors, past, current and new coping skills; care giver stress and community support   GOALS ADDRESSED: Managing anxiety symptoms;    ASSESSMENT/ RECOMMENDATION :  Pt currently experiencing on going anxiety. Symptoms come and go with no specific triggers.  Patient breathing in a brown bag, fanning and stepping out to get fresh air to manage her anxiety  Pt may benefit from adding relaxed breathing to her current interventions. Behavioral Health Intervention: psycho-education, Reflective listening and relaxed breathing Community Resource for Demential caregiver support will [employer]  PLAN: 1.. Patient will work on the following behavioral recommendations: relaxed breathing 2. Patient will follow up on resources provided for demential caregiver support 3. LCSW will call patient in 5 to 6 business days. ( after 12:00 [phone])  Casimer Lanius, LCSW Licensed Clinical Social Worker [facility]   [phone] 10:48 AM    Warm Hand Off Completed.

## 2016-03-19 NOTE — Progress Notes (Signed)
[facility] Progress Note  Subjective:  [name] is a 66 y.o. female with CHF, HTN, HLD, T2DM, and tobacco abuse who presents for medication refill and complaints of leg cramps, dizziness, and trouble sleeping.  #Medication refill - Patient has been taking ativan 0.5 mg BID for over a decade for anxiety, panic disorder. Discussed at last visit recommendation to taper off and start SSRI. - Patient wary of changing medication but cautiously more open today to slowly reducing dose - Discussed at last visit taking truly as prn but patient still taking first thing when she wakes up in the morning and in the afternoon but reports sometimes almost forgetting to take it in the afternoon. She says trigger to take the medication is anticipating becoming annoyed by her husband who has dementia and asks the same questions over and over again and laughs inappropriately.  ROS: Denies falls, sedation  #Leg cramps - Have been occurring for about 2 weeks - Sharp cramps at night - Does not drink caffeine - Does not do regular exercise - History of R piriformis syndrome and diabetic neuropathy ROS: No leg swelling or SOB  #Dizziness - Chronic issue, improves with diphenhydramine - Recently treated for L ear effusion - Thinks dizziness has been occurring a bit more frequently (~3-4 times a week) - No sensation of room spinning, just feels "off balance" - Has rx for meclizine - Concerned because plans to visit [place] at end of month and dose not want to lose balance while traveling/on subway  #Trouble sleeping - Wakes up several times at night to urinate then has trouble falling back asleep - Has to get up at 4 a.m. to drive husband to work - Has tried melatonin 5 mg with little improvement - Does not drink caffeine, does drink EtOH  Objective: Blood pressure 108/60, pulse 78, temperature 98.4 F (36.9 C), temperature source Oral, height 5\' 6"  (1.676 m), weight 175 lb (79.4 kg),  SpO2 95 %. Body mass index is 28.25 kg/m. Constitutional: Overweight female, in NAD HENT: TMs normal bilaterally Cardiovascular: RRR, S1, S2, no m/r/g.  Pulmonary/Chest: Effort normal and breath sounds normal. No respiratory distress.   Musculoskeletal: No LE edema or TTP. Neurological: AOx3, no focal deficits. CNIII-XII intact. Normal gail and negative Romberg's test.  Vitals reviewed  Assessment/Plan: Anxiety state - Patient agrees to decrease in ativan # of pills to 55 per month over next 2 months (decrease of only 5 fewer per month). Again emphasized taking only prn.  - Provided handout on SSRIs; wants to read more about them rather than initiating today - If patient disagrees with further ativan taper at follow-up, would plan to refer her to psychiatry for treatment of her anxiety/panic disorder  Leg cramps - New problem. Could be restless leg syndrome vs worsening peripheral neuropathy. Will check electrolytes, vitamin D levels, CBC to see if K level, vitamin D deficiency or anemia could be contributing. Holding on CK level though on statin, as really only happening at night.  - Recommended increasing physical activity. Patient says she would like to walk more.  - May need to increase gabapentin  Dizziness - Chronic. Responds to antihistamine. Normal neurologic exam. Recent middle ear effusion could have exacerbated.  - Recommended continuing prn meclizine.   Trouble in sleeping - Chronic. Could try increasing melatonin to 10 mg, as issue is more with nighttime awakening. Would not start prescription sleep aid as already on tramadol, ativan, occasional muscle relaxant, prn antihistamine.  -  Discussed stopping fluids a few hours prior to bedtime - Recommended increasing physical acitivity - Patient agreed to meeting with [facility] prior to end of appointment to discuss progressive relaxation techniques  Follow-up in a couple months for hgb A1c check and physical.  [name],  MD [place], PGY-2

## 2016-03-19 NOTE — Patient Instructions (Addendum)
Ms. Villagomez,  I recommend trying 10 mg of melatonin for sleep.  I am checking your electrolytes, for anemia, and vitamin D levels for your muscle cramps. I will call you with those results.  For dizziness, I would recommend the meclizine.   Please see me before your next refill for ativan. We could do a general check-up at that point, too.   Best, Dr. Ola Spurr

## 2016-03-20 LAB — COMPREHENSIVE METABOLIC PANEL
ALBUMIN: 4.3 g/dL (ref 3.6–4.8)
ALT: 23 IU/L (ref 0–32)
AST: 13 IU/L (ref 0–40)
Albumin/Globulin Ratio: 1.8 (ref 1.2–2.2)
Alkaline Phosphatase: 90 IU/L (ref 39–117)
BUN / CREAT RATIO: 19 (ref 12–28)
BUN: 11 mg/dL (ref 8–27)
Bilirubin Total: 0.6 mg/dL (ref 0.0–1.2)
CALCIUM: 9.4 mg/dL (ref 8.7–10.3)
CO2: 27 mmol/L (ref 18–29)
CREATININE: 0.57 mg/dL (ref 0.57–1.00)
Chloride: 95 mmol/L — ABNORMAL LOW (ref 96–106)
GFR calc Af Amer: 114 mL/min/{1.73_m2} (ref >59)
GFR, EST NON AFRICAN AMERICAN: 99 mL/min/{1.73_m2} (ref >59)
GLOBULIN, TOTAL: 2.4 g/dL (ref 1.5–4.5)
Glucose: 240 mg/dL — ABNORMAL HIGH (ref 65–99)
Potassium: 3.7 mmol/L (ref 3.5–5.2)
SODIUM: 139 mmol/L (ref 134–144)
Total Protein: 6.7 g/dL (ref 6.0–8.5)

## 2016-03-20 LAB — CBC
HEMATOCRIT: 42.1 % (ref 34.0–46.6)
Hemoglobin: 14.6 g/dL (ref 11.1–15.9)
MCH: 32.5 pg (ref 26.6–33.0)
MCHC: 34.7 g/dL (ref 31.5–35.7)
MCV: 94 fL (ref 79–97)
PLATELETS: 235 10*3/uL (ref 150–379)
RBC: 4.49 x10E6/uL (ref 3.77–5.28)
RDW: 13.7 % (ref 12.3–15.4)
WBC: 10.5 10*3/uL (ref 3.4–10.8)

## 2016-03-20 LAB — VITAMIN D 25 HYDROXY (VIT D DEFICIENCY, FRACTURES): Vit D, 25-Hydroxy: 36.6 ng/mL (ref 30.0–100.0)

## 2016-03-22 DIAGNOSIS — R252 Cramp and spasm: Secondary | ICD-10-CM | POA: Insufficient documentation

## 2016-03-22 DIAGNOSIS — G479 Sleep disorder, unspecified: Secondary | ICD-10-CM | POA: Insufficient documentation

## 2016-03-22 DIAGNOSIS — R42 Dizziness and giddiness: Secondary | ICD-10-CM | POA: Insufficient documentation

## 2016-03-22 NOTE — Assessment & Plan Note (Signed)
-   Chronic. Responds to antihistamine. Normal neurologic exam. Recent middle ear effusion could have exacerbated.  - Recommended continuing prn meclizine.

## 2016-03-22 NOTE — Assessment & Plan Note (Signed)
-   New problem. Could be restless leg syndrome vs worsening peripheral neuropathy. Will check electrolytes, vitamin D levels, CBC to see if K level, vitamin D deficiency or anemia could be contributing. Holding on CK level though on statin, as really only happening at night.  - Recommended increasing physical activity. Patient says she would like to walk more.  - May need to increase gabapentin

## 2016-03-22 NOTE — Assessment & Plan Note (Signed)
-   Patient agrees to decrease in ativan # of pills to 55 per month over next 2 months (decrease of only 5 fewer per month). Again emphasized taking only prn.  - Provided handout on SSRIs; wants to read more about them rather than initiating today - If patient disagrees with further ativan taper at follow-up, would plan to refer her to psychiatry for treatment of her anxiety/panic disorder

## 2016-03-22 NOTE — Assessment & Plan Note (Addendum)
-   Chronic. Could try increasing melatonin to 10 mg, as issue is more with nighttime awakening. Would not start prescription sleep aid as already on tramadol, ativan, occasional muscle relaxant, prn antihistamine.  - Discussed stopping fluids a few hours prior to bedtime - Recommended increasing physical acitivity - Patient agreed to meeting with [facility] prior to end of appointment to discuss progressive relaxation techniques

## 2016-03-27 ENCOUNTER — Telehealth: Payer: Self-pay | Admitting: Licensed Clinical Social Worker

## 2016-03-27 NOTE — BH Specialist Note (Signed)
[facility] Follow-up call to patient ref. [facility] consult 03/19/16 during office visit with PCP.  Left message to call LCSW.  Plan: LCSW will follow-up with patient in 5 business days if no return call is received.  Casimer Lanius, LCSW Licensed Clinical Social Worker [place]   [phone] 3:18 PM

## 2016-03-27 NOTE — Telephone Encounter (Signed)
[facility] Follow-up call to patient ref. [facility] consult 03/19/16 during office visit with PCP.  Left message to call LCSW.

## 2016-04-02 ENCOUNTER — Telehealth: Payer: Self-pay | Admitting: Licensed Clinical Social Worker

## 2016-04-02 NOTE — BH Specialist Note (Signed)
[facility] follow up call to patient.  Left second message to call LCSW if [facility] services are needed.  Plan: LCSW will wait for patient to return call.   Casimer Lanius, LCSW Licensed Clinical Social Worker [place]   [phone] 1:35 PM

## 2016-04-02 NOTE — Telephone Encounter (Signed)
LCSW will wait for return call from patient.  Casimer Lanius, LCSW Licensed Clinical Social Worker [facility]   [phone] 3:58 PM

## 2016-05-07 ENCOUNTER — Ambulatory Visit: Payer: Medicare Other | Admitting: Podiatry

## 2016-05-19 ENCOUNTER — Other Ambulatory Visit (HOSPITAL_COMMUNITY)
Admission: RE | Admit: 2016-05-19 | Discharge: 2016-05-19 | Disposition: A | Discharge status: Home / Self Care | Payer: Medicare Other | Source: Ambulatory Visit | Attending: Family Medicine | Admitting: Family Medicine

## 2016-05-19 ENCOUNTER — Ambulatory Visit (INDEPENDENT_AMBULATORY_CARE_PROVIDER_SITE_OTHER): Payer: Medicare Other | Admitting: Internal Medicine

## 2016-05-19 VITALS — BP 135/80 | HR 88 | Temp 99.3°F | Ht 68.0 in | Wt 174.0 lb

## 2016-05-19 DIAGNOSIS — Z124 Encounter for screening for malignant neoplasm of cervix: Secondary | ICD-10-CM

## 2016-05-19 DIAGNOSIS — E1140 Type 2 diabetes mellitus with diabetic neuropathy, unspecified: Secondary | ICD-10-CM | POA: Diagnosis not present

## 2016-05-19 DIAGNOSIS — M25552 Pain in left hip: Secondary | ICD-10-CM | POA: Diagnosis not present

## 2016-05-19 DIAGNOSIS — M62830 Muscle spasm of back: Secondary | ICD-10-CM | POA: Diagnosis not present

## 2016-05-19 DIAGNOSIS — L309 Dermatitis, unspecified: Secondary | ICD-10-CM

## 2016-05-19 DIAGNOSIS — G6289 Other specified polyneuropathies: Secondary | ICD-10-CM | POA: Diagnosis not present

## 2016-05-19 DIAGNOSIS — H00011 Hordeolum externum right upper eyelid: Secondary | ICD-10-CM | POA: Diagnosis not present

## 2016-05-19 DIAGNOSIS — K219 Gastro-esophageal reflux disease without esophagitis: Secondary | ICD-10-CM | POA: Diagnosis not present

## 2016-05-19 DIAGNOSIS — Z0000 Encounter for general adult medical examination without abnormal findings: Secondary | ICD-10-CM

## 2016-05-19 DIAGNOSIS — F411 Generalized anxiety disorder: Secondary | ICD-10-CM

## 2016-05-19 DIAGNOSIS — R42 Dizziness and giddiness: Secondary | ICD-10-CM | POA: Diagnosis not present

## 2016-05-19 DIAGNOSIS — I10 Essential (primary) hypertension: Secondary | ICD-10-CM | POA: Diagnosis not present

## 2016-05-19 LAB — POCT GLYCOSYLATED HEMOGLOBIN (HGB A1C): Hemoglobin A1C: 9.2

## 2016-05-19 MED ORDER — HYDROCHLOROTHIAZIDE 12.5 MG PO CAPS: 12.5000 mg | ORAL_CAPSULE | Freq: Every day | ORAL | 2 refills | Status: DC

## 2016-05-19 MED ORDER — ATORVASTATIN CALCIUM 40 MG PO TABS: 40.0000 mg | ORAL_TABLET | Freq: Every day | ORAL | 2 refills | Status: DC

## 2016-05-19 MED ORDER — ONETOUCH ULTRASOFT LANCETS MISC: 5 refills | Status: DC

## 2016-05-19 MED ORDER — OLOPATADINE HCL 0.6 % NA SOLN: 1.0000 | Freq: Every morning | NASAL | 0 refills | Status: DC

## 2016-05-19 MED ORDER — GLIPIZIDE 10 MG PO TABS: 10.0000 mg | ORAL_TABLET | Freq: Two times a day (BID) | ORAL | 2 refills | Status: DC

## 2016-05-19 MED ORDER — LORAZEPAM 0.5 MG PO TABS: 0.5000 mg | ORAL_TABLET | Freq: Two times a day (BID) | ORAL | 1 refills | Status: DC | PRN

## 2016-05-19 MED ORDER — ASPIRIN 81 MG PO CHEW: 81.0000 mg | CHEWABLE_TABLET | Freq: Every day | ORAL | 0 refills | Status: DC

## 2016-05-19 MED ORDER — DICLOFENAC SODIUM 1 % TD GEL: 2.0000 g | Freq: Four times a day (QID) | TRANSDERMAL | 1 refills | Status: DC

## 2016-05-19 MED ORDER — POLYETHYLENE GLYCOL 3350 17 G PO PACK: 17.0000 g | PACK | ORAL | 1 refills | Status: DC

## 2016-05-19 MED ORDER — GABAPENTIN 300 MG PO CAPS: 600.0000 mg | ORAL_CAPSULE | Freq: Two times a day (BID) | ORAL | 2 refills | Status: DC | PRN

## 2016-05-19 MED ORDER — LOSARTAN POTASSIUM 50 MG PO TABS: 50.0000 mg | ORAL_TABLET | Freq: Every day | ORAL | 2 refills | Status: DC

## 2016-05-19 MED ORDER — SUCRALFATE 1 GM/10ML PO SUSP: 1.0000 g | Freq: Four times a day (QID) | ORAL | 0 refills | Status: DC | PRN

## 2016-05-19 MED ORDER — MECLIZINE HCL 25 MG PO TABS: 25.0000 mg | ORAL_TABLET | Freq: Two times a day (BID) | ORAL | 2 refills | Status: DC | PRN

## 2016-05-19 MED ORDER — BACLOFEN 10 MG PO TABS: 10.0000 mg | ORAL_TABLET | Freq: Three times a day (TID) | ORAL | 1 refills | Status: DC | PRN

## 2016-05-19 MED ORDER — MOMETASONE FUROATE 50 MCG/ACT NA SUSP: 2.0000 | Freq: Every day | NASAL | 3 refills | Status: DC

## 2016-05-19 MED ORDER — PANTOPRAZOLE SODIUM 40 MG PO TBEC: 40.0000 mg | DELAYED_RELEASE_TABLET | Freq: Every day | ORAL | 2 refills | Status: DC

## 2016-05-19 MED ORDER — METFORMIN HCL 1000 MG PO TABS: 1000.0000 mg | ORAL_TABLET | Freq: Two times a day (BID) | ORAL | 2 refills | Status: DC

## 2016-05-19 MED ORDER — ATENOLOL 25 MG PO TABS: ORAL_TABLET | ORAL | 2 refills | Status: DC

## 2016-05-19 MED ORDER — TRAMADOL HCL 50 MG PO TABS: 50.0000 mg | ORAL_TABLET | Freq: Three times a day (TID) | ORAL | 2 refills | Status: DC | PRN

## 2016-05-19 MED ORDER — TRIAMCINOLONE ACETONIDE 0.1 % EX CREA: 1.0000 "application " | TOPICAL_CREAM | Freq: Two times a day (BID) | CUTANEOUS | 1 refills | Status: DC

## 2016-05-19 MED ORDER — ONETOUCH ULTRA BLUE VI STRP: ORAL_STRIP | 11 refills | Status: DC

## 2016-05-19 NOTE — Patient Instructions (Addendum)
[name],  Your blood sugar average was very high at 9.2 today.  Please keep a close log over the next few weeks and come back within the next month.   Increase glipizide to 10 mg twice daily.  I will refer you to psychiatry for second opinion on anxiety medication.  I will call you with your pap smear results.  Best, Dr. Ola Spurr

## 2016-05-19 NOTE — Progress Notes (Signed)
[facility] Progress Note  Subjective:  [name] is a 66 y.o. here for annual physical.   Current concerns: Has 3 rows of eyelashes of R upper eyelid and thinks a few may have broken off into her eye; neuropathy getting worse; almost had 2 panic attacks with decreased number of ativan (#55 instead of 60 per month as beginning of taper).   T2DM: - Checks blood sugars twice daily usually around breakfast and at night - Running high in 200s-300s - Wondering if diet coke could be contributing - Cannot identify changes in her diet that could be making sugars worse - Says legs cramps are worse; currently taking gabapentin BID (900 mg in a.m., 600 mg around noon and 600 mg at night) - Taking metformin 1000 mg BID and glipizide 10 mg daily  Eyelash problem: - Wondering if it's normal to have more than one row of hairs on eyelid - Thinks may have eyelash in eye because was trying to pluck out extra eyelashes and some broke off - Noticed bump near one of broken off areas - Denies eye pain or sensation of foreign body in eye - No changes in vision  Anxiety: - Almost had 2 panic attacks when did not take second dose of ativan - Tried to stop taking ativan right as she wakes up but feels anxiety building up and has been unable - Received information after last visit about SSRIs but has not reviewed it  Medication refills: - Patient requested all medications be refilled and printed out for her.   Gyn concerns/Preventative healthcare  Last menstrual period: No LMP recorded. Patient has had a hysterectomy.  Sexually active: not presently -- married but husband has some dementia and bladder cancer; patient not interested  Contraception or hormonal therapy: No  Hx of STD: No; Patient does not desire STD screening  Hot flashes: Yes  Vaginal discharge: No  Dysuria:No   Last mammogram: December 2017 by Dr. Isaiah Blakes at [place] [facility] or concerns:  No  Last pap smear: 09/11/13 - Negative cytology and Negative HPV; explained could wait 2 more years for next test but patient desires testing; conflicting records about whether her cervix was removed or not with her hysterectomy but was noted on last exam and pap result said endocervical transition zone present in 2015  PMH, Surgical Hx, Family Hx, Social History reviewed.  Social: Does neck, back, and hip exercises but no regular cardiovascular exercise. Still smoking 0.5-1 pack of cigarettes daily and not interested in quitting at this time. Drinks EtOH about every 3-4 months with just 1-2 servings at a time. Has used marijuana in the past. Feels safe in her relationship.   Review of Systems: Has some swelling of hands or feet; history of dizziness (vertigo); anxiety and will feel like she would have a panic attack Per HPI. Otherwise a complete 10 point ROS was negative.    Objective:   Vitals:   05/19/16 1348 05/19/16 1400  BP: (!) 144/78 135/80  Pulse: 88   Temp: 99.3 F (37.4 C)    Blood pressure 135/80, pulse 88, temperature 99.3 F (37.4 C), temperature source Oral, height 5\' 8"  (1.727 m), weight 174 lb (78.9 kg), SpO2 98 %. Body mass index is 26.46 kg/m.  Exam: General: well appearing, NAD. HEENT: NCAT. Tiny stye of R upper eyelid.  Cardiovascular: RRR. No murmurs, rubs, or gallops. Respiratory: CTAB. No rales, rhonchi, or wheeze. Abdomen: soft, nontender, nondistended. Extremities: warm, well  perfused. No LE edema. Skin: Warm, dry, intact. Neuro: No focal deficits.  Pelvic Exam:        External: normal female genitalia without lesions or masses        Vagina: normal without lesions or masses        Cervix: Present, tilted to patient's left        Pap smear: performed        Samples for Wet prep, GC/Chlamydia were not obtained    Chemistry      Component Value Date/Time   NA 139 03/19/2016 1022   K 3.7 03/19/2016 1022   CL 95 (L) 03/19/2016 1022   CO2 27  03/19/2016 1022   BUN 11 03/19/2016 1022   CREATININE 0.57 03/19/2016 1022   CREATININE 0.61 08/22/2015 1416      Component Value Date/Time   CALCIUM 9.4 03/19/2016 1022   ALKPHOS 90 03/19/2016 1022   AST 13 03/19/2016 1022   ALT 23 03/19/2016 1022   BILITOT 0.6 03/19/2016 1022      Lab Results  Component Value Date   WBC 10.5 03/19/2016   HGB 14.3 10/03/2015   HCT 42.1 03/19/2016   MCV 94 03/19/2016   PLT 235 03/19/2016   Lab Results  Component Value Date   TSH 0.70 07/01/2010   Lab Results  Component Value Date   HGBA1C 9.2 05/19/2016   PHQ-2: 0  Assessment/Plan: Diabetes mellitus with neuropathy ([place]) - Poorly controlled. Has declined increases in metformin and discussing starting insulin in the past. A1c 9.2, compared to 8.6 in February, 8.8 in November, and 8.4 last August - Advised increasing glipizide to 10 mg BID - Counseled patient insulin would be best option at this point and that I was very worried at her level of CBG control. Weekly GLP-1 agonist also mentioned as an insulin alternative, though felt insulin appropriate at this time. - Asked patient to return within the month with her blood sugar log to discuss making further medication changes - Recommended taking 900 mg gabapentin TID instead of 900 mg qd and 600 mg BID. Offered to change from multiple pills to larger dose, but patient says has trouble swallowing large pills. - Will need to continue to stress bad outcomes of such poorly controlled diabetes  Stye - Recommended warm compresses  Anxiety state - Patient continues to use ativan BID rather than prn - Advised patient I would only continue to prescribe as a bridge while starting an SSRI, which is first line treatment for panic disorder - Advised patient to get second opinion by seeing Psychiatry. Offered outside referral, but she prefers to be seen at [facility]. Will message Dr. Gwenlyn Saran about possible joint appointment with Dr. Tammi Klippel.  - Provided 2  month's worth of ativan to give patient time to get 2nd opinion but would prescribe only as bridge thereafter; patient expressed understanding and plans to look over SSRI information I sent her after last visit  Follow-up in the next few weeks to discuss diabetes.  Health Maintenance: Should get patient scheduled for low-dose CT scan due to lung cancer risk. Will discuss at next appointment.   [name], MD [place], PGY-2

## 2016-05-20 LAB — CYTOLOGY - PAP
Adequacy: ABSENT
Diagnosis: NEGATIVE
HPV: NOT DETECTED

## 2016-05-21 ENCOUNTER — Encounter: Payer: Self-pay | Admitting: Internal Medicine

## 2016-05-21 DIAGNOSIS — H00019 Hordeolum externum unspecified eye, unspecified eyelid: Secondary | ICD-10-CM | POA: Insufficient documentation

## 2016-05-21 NOTE — Assessment & Plan Note (Signed)
-   Patient continues to use ativan BID rather than prn - Advised patient I would only continue to prescribe as a bridge while starting an SSRI, which is first line treatment for panic disorder - Advised patient to get second opinion by seeing Psychiatry. Offered outside referral, but she prefers to be seen at [facility]. Will message Dr. Gwenlyn Saran about possible joint appointment with Dr. Tammi Klippel.  - Provided 2 month's worth of ativan to give patient time to get 2nd opinion but would prescribe only as bridge thereafter; patient expressed understanding and plans to look over SSRI information I sent her after last visit

## 2016-05-21 NOTE — Assessment & Plan Note (Signed)
-   Poorly controlled. Has declined increases in metformin and discussing starting insulin in the past. A1c 9.2, compared to 8.6 in February, 8.8 in November, and 8.4 last August - Advised increasing glipizide to 10 mg BID - Counseled patient insulin would be best option at this point and that I was very worried at her level of CBG control. Weekly GLP-1 agonist also mentioned as an insulin alternative, though felt insulin appropriate at this time. - Asked patient to return within the month with her blood sugar log to discuss making further medication changes - Recommended taking 900 mg gabapentin TID instead of 900 mg qd and 600 mg BID. Offered to change from multiple pills to larger dose, but patient says has trouble swallowing large pills. - Will need to continue to stress bad outcomes of such poorly controlled diabetes

## 2016-05-21 NOTE — Assessment & Plan Note (Signed)
-   Recommended warm compresses

## 2016-06-09 ENCOUNTER — Encounter: Payer: Self-pay | Admitting: Internal Medicine

## 2016-06-10 ENCOUNTER — Encounter (INDEPENDENT_AMBULATORY_CARE_PROVIDER_SITE_OTHER): Payer: Self-pay

## 2016-06-10 ENCOUNTER — Encounter: Payer: Self-pay | Admitting: Internal Medicine

## 2016-06-10 ENCOUNTER — Ambulatory Visit (INDEPENDENT_AMBULATORY_CARE_PROVIDER_SITE_OTHER): Payer: Medicare Other | Admitting: Internal Medicine

## 2016-06-10 VITALS — BP 106/64 | HR 88 | Ht 65.0 in | Wt 173.0 lb

## 2016-06-10 DIAGNOSIS — I471 Supraventricular tachycardia: Secondary | ICD-10-CM

## 2016-06-10 DIAGNOSIS — I10 Essential (primary) hypertension: Secondary | ICD-10-CM | POA: Diagnosis not present

## 2016-06-10 NOTE — Patient Instructions (Signed)

## 2016-06-10 NOTE — Progress Notes (Signed)
HPI  [name] returns today for followup of SVT. She is a very pleasant middle-age woman with a history of SVT status post catheter ablation remotely. She has rare palpitations. She has ongoing tobacco abuse, smoking a PPD. She has hypertension. She denies chest pain, shortness of breath, or syncope. Her back pain is improved. She denies syncope.she has been unable to stop smoking.  Allergies  Allergen Reactions  . Alcohol Swabs [Isopropyl Alcohol] Nausea Only    Use peroxide instead  . Amoxicillin-Pot Clavulanate Other (See Comments)    REACTION: Unsure - maybe swollen?  Did not agree with her.  . Penicillins Other (See Comments)    REACTION: Unsure what reaction she has.     Current Outpatient Prescriptions  Medication Sig Dispense Refill  . acetaminophen (TYLENOL) 325 MG tablet Take 650 mg by mouth every 6 (six) hours as needed for moderate pain.    Marland Kitchen aspirin 81 MG chewable tablet Chew 1 tablet (81 mg total) by mouth daily. 100 tablet 0  . atenolol (TENORMIN) 25 MG tablet TAKE 1 TABLET(S) BY MOUTH DAILY 90 tablet 2  . atorvastatin (LIPITOR) 40 MG tablet Take 1 tablet (40 mg total) by mouth daily. 90 tablet 2  . baclofen (LIORESAL) 10 MG tablet Take 1 tablet (10 mg total) by mouth 3 (three) times daily as needed for muscle spasms. 90 each 1  . diclofenac sodium (VOLTAREN) 1 % GEL Apply 2 g topically 4 (four) times daily. 100 g 1  . gabapentin (NEURONTIN) 300 MG capsule Take 2-3 capsules (600-900 mg total) by mouth 2 (two) times daily as needed. 540 capsule 2  . glipiZIDE (GLUCOTROL) 10 MG tablet Take 1 tablet (10 mg total) by mouth 2 (two) times daily before a meal. 60 tablet 2  . hydrochlorothiazide (MICROZIDE) 12.5 MG capsule Take 1 capsule (12.5 mg total) by mouth daily. 90 capsule 2  . Lancets (ONETOUCH ULTRASOFT) lancets Use as instructed 100 each 12  . Lancets (ONETOUCH ULTRASOFT) lancets CHECK BLOOD SUGAR 3 TIMES DAILY. 100 each 5  . LORazepam (ATIVAN) 0.5 MG tablet Take 1  tablet (0.5 mg total) by mouth 2 (two) times daily as needed for anxiety. 60 tablet 1  . losartan (COZAAR) 50 MG tablet Take 1 tablet (50 mg total) by mouth daily. 90 tablet 2  . meclizine (ANTIVERT) 25 MG tablet Take 1-2 tablets (25-50 mg total) by mouth 2 (two) times daily as needed for dizziness. if having symptoms.  May repeat in 12 hours later. 30 tablet 2  . metFORMIN (GLUCOPHAGE) 1000 MG tablet Take 1 tablet (1,000 mg total) by mouth 2 (two) times daily with a meal. 180 tablet 2  . mometasone (NASONEX) 50 MCG/ACT nasal spray Place 2 sprays into the nose daily. 17 g 3  . Olopatadine HCl (PATANASE) 0.6 % SOLN 1 drop (1 puff total) by Other route every morning. 30.5 g 0  . ONE TOUCH ULTRA TEST test strip Check blood sugar up to 3 times daily. 100 each 11  . pantoprazole (PROTONIX) 40 MG tablet Take 1 tablet (40 mg total) by mouth daily. 90 tablet 2  . polyethylene glycol (MIRALAX / GLYCOLAX) packet Take 17 g by mouth every other day. 14 each 1  . sucralfate (CARAFATE) 1 GM/10ML suspension Take 10 mLs (1 g total) by mouth 4 (four) times daily as needed (reflux). 420 mL 0  . traMADol (ULTRAM) 50 MG tablet Take 1 tablet (50 mg total) by mouth every 8 (eight) hours as needed for  moderate pain or severe pain. 90 tablet 2  . triamcinolone cream (KENALOG) 0.1 % Apply 1 application topically 2 (two) times daily. For up to 2 weeks then stop. 30 g 1   No current facility-administered medications for this visit.      Past Medical History:  Diagnosis Date  . Allergy   . Anxiety   . AR (allergic rhinitis)   . Asthma   . Cataract   . CHF (congestive heart failure) ([place])   . Colon polyps    hyperplastic  . Disturbance of skin sensation    facial paresthesia; left  . DM2 (diabetes mellitus, type 2) ([place])   . Dyslipidemia   . GERD (gastroesophageal reflux disease)   . Hemorrhoids   . Hyperlipidemia   . Hypertension    essential, benign   . Insomnia   . Menopausal syndrome   . Palpitations     hx  . Routine general medical examination at a health care facility   . Screening for malignant neoplasm of the cervix   . Skin lesion   . SVT (supraventricular tachycardia) ([place])   . Tobacco abuse   . Weakness     ROS:   All systems reviewed and negative except as noted in the HPI.   Past Surgical History:  Procedure Laterality Date  . ABDOMINAL HYSTERECTOMY  1990   For fibroids benign.  Still has ovaries  . ablation for SVT  9/09   Dr. Lovena Le  . APPENDECTOMY    . COLONOSCOPY  2009  . dental extractions    . SIGMOIDOSCOPY  2004     Family History  Problem Relation Age of Onset  . Heart attack Father 38  . Hypertension Mother   . Stroke Mother   . Diabetes Mother   . Stomach cancer Mother   . Cancer Mother   . Coronary artery disease Unknown        female, 1st degree relative <50  . Stomach cancer Maternal Aunt      Social History   Social History  . Marital status: Married    Spouse name: N/A  . Number of children: 3  . Years of education: 74   Occupational History  . Retired    Social History Main Topics  . Smoking status: Current Every Day Smoker    Packs/day: 1.00    Types: Cigarettes  . Smokeless tobacco: Never Used  . Alcohol use 7.0 oz/week    14 Standard drinks or equivalent per week     Comment: beer every 3-4 months   . Drug use: No  . Sexual activity: Not on file   Other Topics Concern  . Not on file   Social History Narrative   Married, does not get regular exercise. On disability.    2 miscarriages, one at 4 and one at 5 months; death at 1 day, unsure of cause.       Health Care POA:    Emergency Contact: Jeanene Erb (friend) [phone] Terri Piedra (friend's daughter) [phone]   End of Life Plan: Gave pt pamplet   Who lives with you: husband   Any pets: 0   Diet: pt has varied diet   Exercise: pt is disabled and does not have a regular exercise routine   Seatbelts: Pt reports wearing seatbelt when in vehicle   Sun  Exposure/Protection: Pt reports wearing a hat, sunglasses and facial sunscreen when outside   Hobbies: shopping and traveling     BP 106/64  Pulse 88   Ht 5\' 5"  (1.651 m)   Wt 173 lb (78.5 kg)   SpO2 96%   BMI 28.79 kg/m   Physical Exam:  Well appearing obese, middle-age woman, NAD HEENT: Unremarkable Neck:  7 cm JVD, no thyromegally Lungs:  Clear with no wheezes, rales, or rhonchi. HEART:  Regular rate rhythm, no murmurs, no rubs, no clicks Abd:  soft, positive bowel sounds, no organomegally, no rebound, no guarding Ext:  2 plus pulses, no edema, no cyanosis, no clubbing Skin:  No rashes no nodules Neuro:  CN II through XII intact, motor grossly intact  EKG normal sinus rhythm with nonspecific ST-T wave abnormality.  Assess/Plan:  1. SVT - she is s/p ablation and has had only rare palpitations. No change. 2. HTN - her blood pressure is well controlled controlled. She is encouraged to lose weight and increase her physical activity and to reduce her salt intake. 3. Tobacco abuse - I have encouraged her to stop smoking. She states she will put it to prayer.  Mikle Bosworth.D.

## 2016-06-12 ENCOUNTER — Telehealth: Payer: Self-pay | Admitting: Psychology

## 2016-06-12 NOTE — Telephone Encounter (Signed)
Called patient because I had her tentatively scheduled for [facility] for June 6th at 11:30.  I think I touched base with Dr. Ola Spurr.  Discussed [facility] with the patient.  She is most interested in continuing Ativan.  I told her that based on my review of her record, Dr. Ola Spurr and Erin Hearing but considered that medicine to not be good care for her and that we would need to look for alternatives.  She said she was still interested in attending the appointment.  I explained the nature of the [facility] and  told her the following:  -  If she is unable to make the appointment she needs to call me. -  If she misses the appointment without a phone call, I won't be able to schedule her back in my clinic.  She voiced an understanding and was able to repeat the appointment date and time back to me.

## 2016-06-15 ENCOUNTER — Ambulatory Visit (INDEPENDENT_AMBULATORY_CARE_PROVIDER_SITE_OTHER): Payer: Medicare Other | Admitting: Internal Medicine

## 2016-06-15 ENCOUNTER — Encounter: Payer: Self-pay | Admitting: Internal Medicine

## 2016-06-15 VITALS — BP 108/60 | HR 88 | Temp 99.0°F | Ht 65.0 in | Wt 172.0 lb

## 2016-06-15 DIAGNOSIS — F17200 Nicotine dependence, unspecified, uncomplicated: Secondary | ICD-10-CM

## 2016-06-15 DIAGNOSIS — F411 Generalized anxiety disorder: Secondary | ICD-10-CM

## 2016-06-15 DIAGNOSIS — E1140 Type 2 diabetes mellitus with diabetic neuropathy, unspecified: Secondary | ICD-10-CM

## 2016-06-15 DIAGNOSIS — E118 Type 2 diabetes mellitus with unspecified complications: Secondary | ICD-10-CM | POA: Diagnosis not present

## 2016-06-15 MED ORDER — LORAZEPAM 0.5 MG PO TABS: 0.5000 mg | ORAL_TABLET | Freq: Two times a day (BID) | ORAL | 0 refills | Status: DC | PRN

## 2016-06-15 MED ORDER — ONETOUCH ULTRA BLUE VI STRP: ORAL_STRIP | 12 refills | Status: DC

## 2016-06-15 MED ORDER — ONETOUCH ULTRASOFT LANCETS MISC: 12 refills | Status: DC

## 2016-06-15 MED ORDER — GLIPIZIDE 10 MG PO TABS: 10.0000 mg | ORAL_TABLET | Freq: Two times a day (BID) | ORAL | 2 refills | Status: DC

## 2016-06-15 NOTE — Progress Notes (Signed)
[facility] Progress Note  Subjective:  [name] is a 66 y.o. female with anxiety, CHF, HTN, T2DM and tobacco abuse who presents to discuss blood sugar control and anxiety.  #T2DM: - Last A1c was 9.2 on 05/19/16 - Increased glipizide to 10 mg twice daily from once daily at last OV. Also taking metformin 1000 mg BID.  - Patient reports improved blood sugar control with measurements usually between 120-150. Still has seen values in the low 200s.  - Not regularly exercising or changing the way she is eating. Does do regular stretching for piriformis syndrome. - Reports increased appetite with second dose of glipizide but says she has not been eating more.  - Is not willing to start insulin at this time and idea of other injectable like GLP-1 inhibitor also not palatable at this time ROS: No increased urinary frequency, no jitteriness  #Anxiety: - Has described as having panic symptoms since an episode of near choking over 10 years ago. However, patient mentions today this may also stem from having seen someone killed when she was younger - Still very hesitant to try therapy other than ativan. Canceled appointment with Dr. Gwenlyn Saran and Dr. Tammi Klippel because she "knew what they were going to say and didn't need to take up their time" - Has a pharmacist she trusts who she will ask about SSRIs  Social: Current smoker. Not interested in quitting at this time.  Objective: Blood pressure 108/60, pulse 88, temperature 99 F (37.2 C), temperature source Oral, height 5\' 5"  (1.651 m), weight 172 lb (78 kg), SpO2 93 %. Body mass index is 28.62 kg/m. Constitutional: Overweight female, in NAD Cardiovascular: RRR, S1, S2, no m/r/g.  Pulmonary/Chest: Effort normal and breath sounds normal. No respiratory distress.  Musculoskeletal: No LE edema Neurological: AOx3, no focal deficits. Psychiatric: Somewhat flat affect.  Vitals reviewed  Results for LALANIA, HASEMAN (MRN [id number]) as of  06/16/2016 18:59  Ref. Range 05/07/2015 14:29 08/22/2015 13:38 11/26/2015 14:00 02/27/2016 13:36 05/19/2016 14:05  Hemoglobin A1C Unknown 8.3 8.4 8.8 8.6 9.2    Assessment/Plan: Diabetes mellitus with neuropathy (HCC) - Improved CBGs on increased dose of glipizide. - Reassess A1c in 2 months. If still above 9, would continue to strongly recommend more aggressive therapy - Encouraged patient to find an activity that gets her heart rate up that she enjoys (has [employer]) - Refilled strips and lancets  Anxiety state - Symptoms concerning for panic disorder. - Continued to recommend changing to first line therapy of SSRI. Patient has been very reluctant to make change. Negotiated to continue ativan at current dosing until follow-up in August, at which time would begin a slow taper after being on SSRI for 6-8 weeks vs see Psychiatry  TOBACCO ABUSE - Not interested in quitting at this time.  - Barriers include husband also smokes.  Follow-up in 2 months for A1c check and either start SSRI with taper of ativan or refer elsewhere.  [name], MD [place], PGY-2

## 2016-06-15 NOTE — Patient Instructions (Signed)
Ms. Pichette,  Please continue glipizide 10 mg twice daily along with your metformin. We'll recheck your A1c in August.  If dizziness becomes more of an issue before then, please see me sooner.  Dr. Valentina Lucks is a great resource if you decide you want to cut back on smoking.  Best, Dr. Ola Spurr  Fluoxetine capsules or tablets (PMDD indication) What is this medicine? FLUOXETINE (floo OX e teen) belongs to a class of drugs known as selective serotonin reuptake inhibitors (SSRIs). It is used for premenstrual dysphoric disorder (PMDD). PMDD causes intense mood and physical symptoms a week or two before your period every month. This drug helps improve mood swings, tiredness, tension, and breast tenderness. This medicine may be used for other purposes; ask your health care provider or pharmacist if you have questions. COMMON BRAND NAME(S): Prozac, Sarafem, Selfemra What should I tell my health care provider before I take this medicine? They need to know if you have any of these conditions: -bipolar disorder or a family history of bipolar disorder -bleeding disorders -glaucoma -heart disease -liver disease -low levels of sodium in the blood -seizures -suicidal thoughts, plans, or attempt; a previous suicide attempt by you or a family member -take MAOIs like Carbex, Eldepryl, Marplan, Nardil, and Parnate -take medicines that treat or prevent blood clots -thyroid disease -an unusual or allergic reaction to fluoxetine, other medicines, foods, dyes, or preservatives -pregnant or trying to get pregnant -breast-feeding -bipolar disorder or a family history of bipolar disorder -bleeding disorders -glaucoma -heart disease -liver disease -low levels of sodium in the blood -seizures -suicidal thoughts, plans, or attempt; a previous suicide attempt by you or a family member -take MAOIs like Carbex, Eldepryl, Marplan, Nardil, and Parnate -take medicines that treat or prevent blood clots -thyroid  disease -an unusual or allergic reaction to fluoxetine, other medicines, foods, dyes, or preservatives -pregnant or trying to get pregnant -breast-feeding How should I use this medicine? Take this medicine by mouth with a glass of water. Follow the directions on the prescription label. You can take it with or without food. Take your medicine at regular intervals. Do not take it more often than directed. Do not stop taking this medicine suddenly except upon the advice of your doctor. Stopping this medicine too quickly may cause serious side effects or your condition may worsen. A special MedGuide will be given to you by the pharmacist with each prescription and refill. Be sure to read this information carefully each time. Talk to your pediatrician regarding the use of this medicine in children. Special care may be needed. Overdosage: If you think you have taken too much of this medicine contact a poison control center or emergency room at once. NOTE: This medicine is only for you. Do not share this medicine with others. What if I miss a dose? If you miss a dose, skip the missed dose and go back to your regular dosing schedule. Do not take double or extra doses. What may interact with this medicine? Do not take this medicine with any of the following medications: -other medicines containing fluoxetine, like Prozac or Symbyax -cisapride -linezolid -MAOIs like Carbex, Eldepryl, Marplan, Nardil, and Parnate -methylene blue (injected into a vein) -pimozide -thioridazine This medicine may also interact with the following medications: -alcohol -amphetamines -aspirin and aspirin-like medicines -carbamazepine -certain medicines for depression, anxiety, or psychotic disturbances -certain medicines for migraine headaches like almotriptan, eletriptan, frovatriptan, naratriptan, rizatriptan, sumatriptan,  zolmitriptan -digoxin -diuretics -fentanyl -flecainide -furazolidone -isoniazid -lithium -medicines for sleep -medicines that  treat or prevent blood clots like warfarin, enoxaparin, and dalteparin -NSAIDs, medicines for pain and inflammation, like ibuprofen or naproxen -phenytoin -procarbazine -propafenone -rasagiline -ritonavir -supplements like St. John's wort, kava kava, valerian -tramadol -tryptophan -vinblastine This list may not describe all possible interactions. Give your health care provider a list of all the medicines, herbs, non-prescription drugs, or dietary supplements you use. Also tell them if you smoke, drink alcohol, or use illegal drugs. Some items may interact with your medicine. What should I watch for while using this medicine? Tell your doctor if your symptoms do not get better or if they get worse. Visit your doctor or health care professional for regular checks on your progress. Patients and their families should watch out for new or worsening thoughts of suicide or depression. Also watch out for sudden changes in feelings such as feeling anxious, agitated, panicky, irritable, hostile, aggressive, impulsive, severely restless, overly excited and hyperactive, or not being able to sleep. If this happens, especially at the beginning of treatment or after a change in dose, call your health care professional. Dennis Bast may get drowsy or dizzy. Do not drive, use machinery, or do anything that needs mental alertness until you know how this medicine affects you. Do not stand or sit up quickly, especially if you are an older patient. This reduces the risk of dizzy or fainting spells. Alcohol may interfere with the effect of this medicine. Avoid alcoholic drinks. Your mouth may get dry. Chewing sugarless gum or sucking hard candy, and drinking plenty of water may help. Contact your doctor if the problem does not go away or is severe. This medicine may affect blood sugar levels. If  you have diabetes, check with your doctor or health care professional before you change your diet or the dose of your diabetic medicine. What side effects may I notice from receiving this medicine? Side effects that you should report to your doctor or health care professional as soon as possible: -allergic reactions like skin rash, itching or hives, swelling of the face, lips, or tongue -anxious -black, tarry stools -breathing problems -changes in vision -confusion -elevated mood, decreased need for sleep, racing thoughts, impulsive behavior -eye pain -fast, irregular heartbeat -feeling faint or lightheaded, falls -feeling agitated, angry, or irritable -hallucination, loss of contact with reality -loss of balance or coordination -loss of memory -restlessness, pacing, inability to keep still -seizures -stiff muscles -suicidal thoughts or other mood changes -trouble sleeping -unusual bleeding or bruising -unusually weak or tired -vomiting Side effects that usually do not require medical attention (report to your doctor or health care professional if they continue or are bothersome): -change in appetite or weight -change in sex drive or performance -diarrhea -dry mouth -headache -increased sweating -indigestion, nausea -tremors This list may not describe all possible side effects. Call your doctor for medical advice about side effects. You may report side effects to FDA at 1-800-FDA-1088. Where should I keep my medicine? Keep out of the reach of children. Store at room temperature between 15 and 30 degrees C (59 and 86 degrees F). Throw away any unused medicine after the expiration date. NOTE: This sheet is a summary. It may not cover all possible information. If you have questions about this medicine, talk to your doctor, pharmacist, or health care provider.  2018 Elsevier/Gold Standard (2015-06-01 15:59:42)

## 2016-06-16 NOTE — Assessment & Plan Note (Signed)
-   Not interested in quitting at this time.  - Barriers include husband also smokes.

## 2016-06-16 NOTE — Assessment & Plan Note (Signed)
-   Improved CBGs on increased dose of glipizide. - Reassess A1c in 2 months. If still above 9, would continue to strongly recommend more aggressive therapy - Encouraged patient to find an activity that gets her heart rate up that she enjoys (has [employer]) - Refilled strips and lancets

## 2016-06-16 NOTE — Assessment & Plan Note (Signed)
-   Symptoms concerning for panic disorder. - Continued to recommend changing to first line therapy of SSRI. Patient has been very reluctant to make change. Negotiated to continue ativan at current dosing until follow-up in August, at which time would begin a slow taper after being on SSRI for 6-8 weeks vs see Psychiatry

## 2016-06-17 ENCOUNTER — Ambulatory Visit: Payer: Medicare Other | Admitting: Psychology

## 2016-06-24 ENCOUNTER — Ambulatory Visit (INDEPENDENT_AMBULATORY_CARE_PROVIDER_SITE_OTHER): Payer: Medicare Other | Admitting: Podiatry

## 2016-06-24 ENCOUNTER — Ambulatory Visit: Payer: Medicare Other | Admitting: Orthotics

## 2016-06-24 ENCOUNTER — Encounter: Payer: Self-pay | Admitting: Podiatry

## 2016-06-24 DIAGNOSIS — E1140 Type 2 diabetes mellitus with diabetic neuropathy, unspecified: Secondary | ICD-10-CM

## 2016-06-24 DIAGNOSIS — B351 Tinea unguium: Secondary | ICD-10-CM | POA: Diagnosis not present

## 2016-06-24 DIAGNOSIS — M2010 Hallux valgus (acquired), unspecified foot: Secondary | ICD-10-CM

## 2016-06-24 NOTE — Progress Notes (Signed)
Patient ID: [name], female   DOB: 1950/06/23, 66 y.o.   MRN: [id number] Complaint:  Visit Type: Patient returns to my office for continued preventative foot care services. Complaint: Patient states" my nails have grown long and thick and become painful to walk and wear shoes" Patient has been diagnosed with DM with neuropathy.. The patient presents for preventative foot care services. No changes to ROS  Podiatric Exam: Vascular: dorsalis pedis and posterior tibial pulses are palpable bilateral. Capillary return is immediate. Temperature gradient is WNL. Skin turgor WNL  Sensorium: Diminished  Semmes Weinstein monofilament test. Normal tactile sensation bilaterally. Nail Exam: Pt has thick disfigured discolored nails with subungual debris noted bilateral entire nail hallux through fifth toenails Ulcer Exam: There is no evidence of ulcer or pre-ulcerative changes or infection. Orthopedic Exam: Muscle tone and strength are WNL. No limitations in general ROM. No crepitus or effusions noted. Foot type and digits show no abnormalities. Bony prominences are unremarkable. Skin: No Porokeratosis. No infection or ulcers  Diagnosis:  Onychomycosis, , Pain in right toe, pain in left toes  Treatment & Plan Procedures and Treatment: Consent by patient was obtained for treatment procedures. The patient understood the discussion of treatment and procedures well. All questions were answered thoroughly reviewed. Debridement of mycotic and hypertrophic toenails, 1 through 5 bilateral and clearing of subungual debris. No ulceration, no infection noted. Patient to see Liliane Channel today. Return Visit-Office Procedure: Patient instructed to return to the office for a follow up visit 3 months for continued evaluation and treatment.    Gardiner Barefoot DPM

## 2016-07-06 IMAGING — CR DG CHEST 2V
2 series · 2 of 2 positions shown · non-contrast
Comparison: PA and lateral chest 12/31/2012.

CLINICAL DATA: Cough for 2 days. Head and chest congestion. Fever.

EXAM:
CHEST  2 VIEW

[view not recorded (1 of 2)]
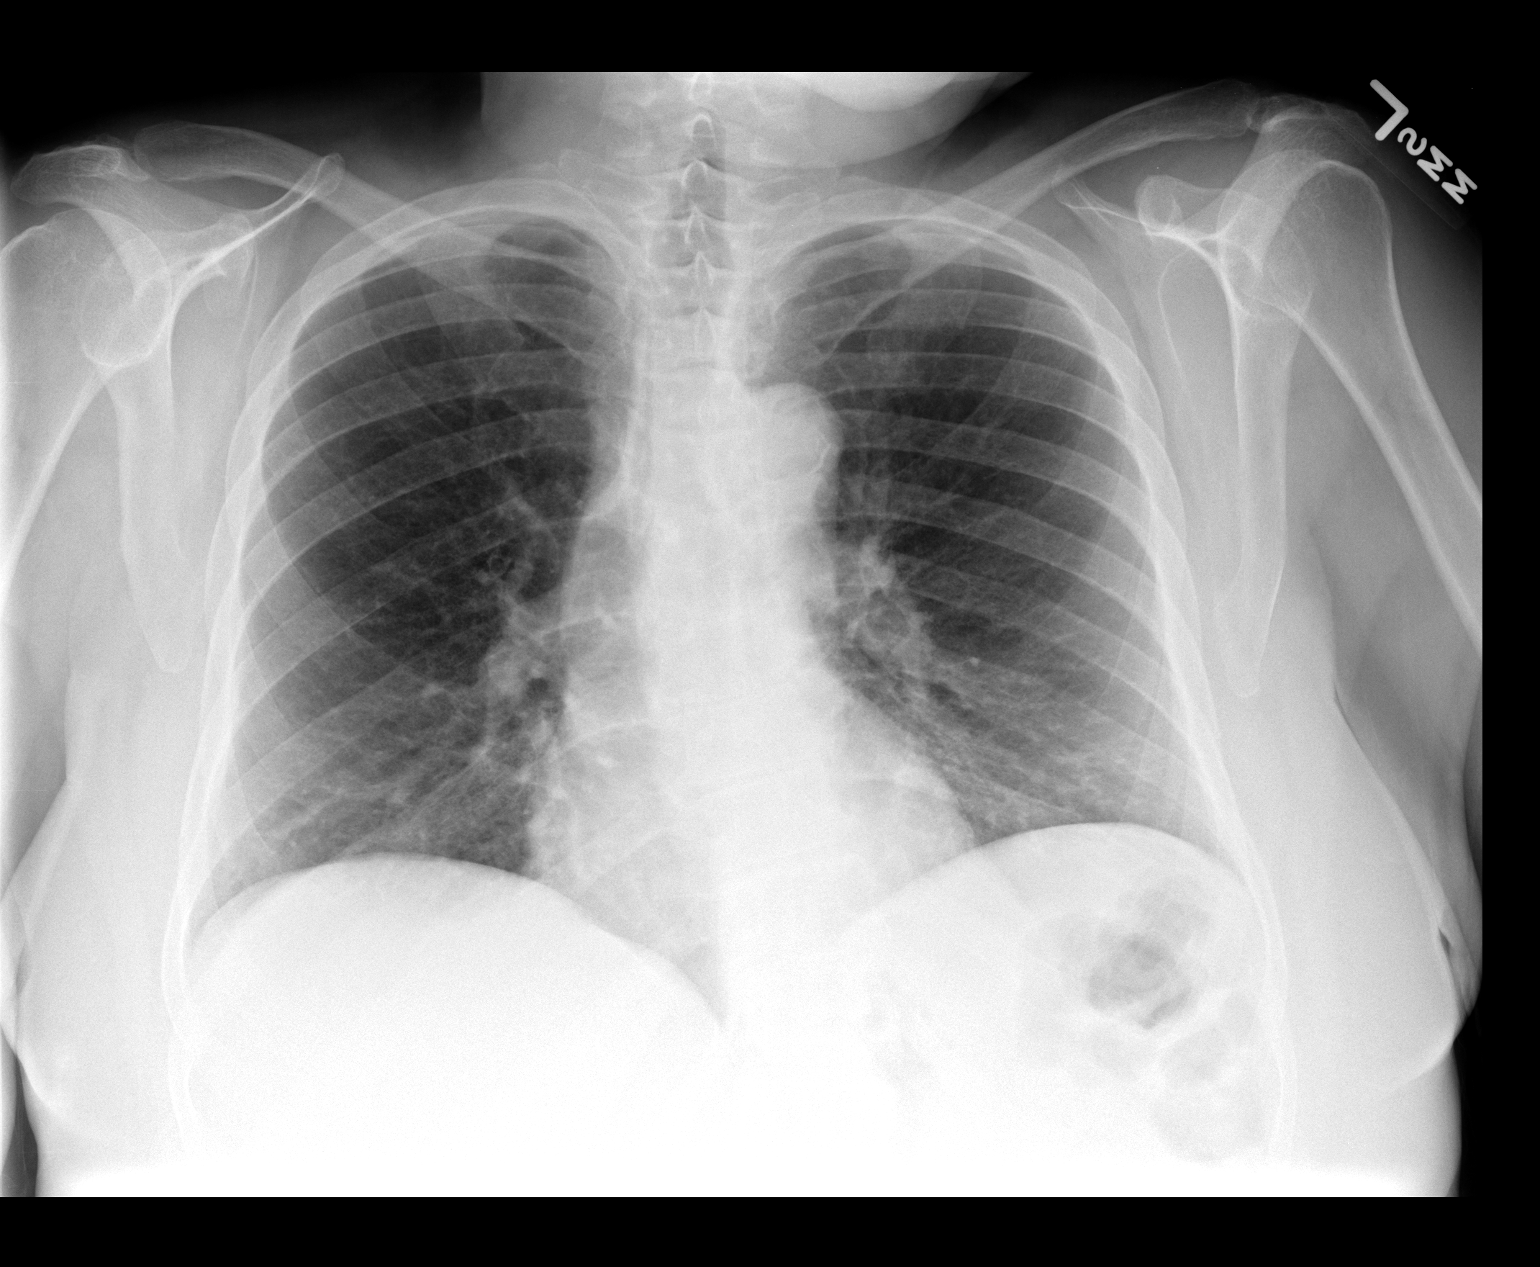

[view not recorded (2 of 2)]
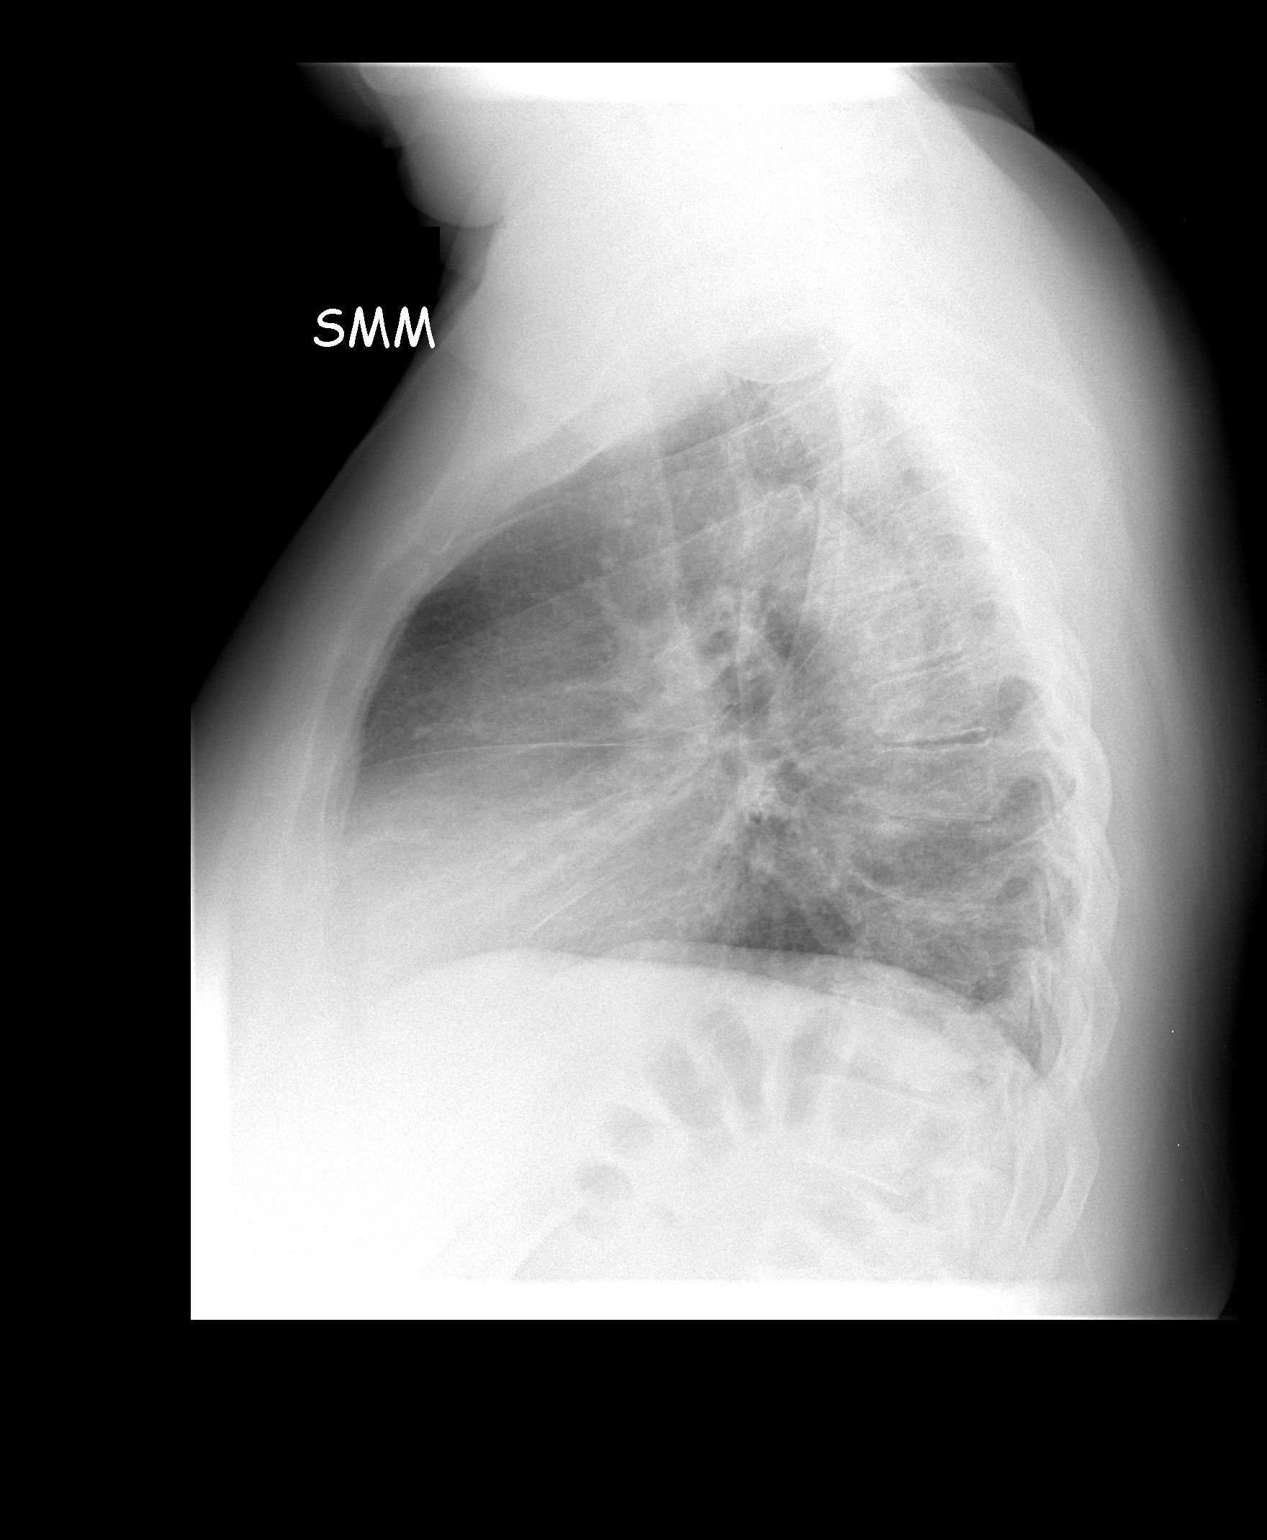

[2 of 2 positions shown; findings below may reference images not displayed]

FINDINGS: Lungs are clear. Heart size is normal. No pneumothorax or pleural
effusion. There is some peribronchial thickening. Thoracic
spondylosis is noted.
IMPRESSION: Findings suggestive of bronchitis. Negative for focal airspace
disease.

## 2016-08-20 ENCOUNTER — Encounter: Payer: Self-pay | Admitting: Internal Medicine

## 2016-08-20 ENCOUNTER — Ambulatory Visit (INDEPENDENT_AMBULATORY_CARE_PROVIDER_SITE_OTHER): Payer: Medicare Other | Admitting: Internal Medicine

## 2016-08-20 VITALS — BP 102/60 | HR 64 | Temp 98.7°F | Ht 65.0 in | Wt 171.0 lb

## 2016-08-20 DIAGNOSIS — R2230 Localized swelling, mass and lump, unspecified upper limb: Secondary | ICD-10-CM | POA: Diagnosis not present

## 2016-08-20 DIAGNOSIS — F411 Generalized anxiety disorder: Secondary | ICD-10-CM | POA: Diagnosis not present

## 2016-08-20 DIAGNOSIS — E1140 Type 2 diabetes mellitus with diabetic neuropathy, unspecified: Secondary | ICD-10-CM | POA: Diagnosis not present

## 2016-08-20 DIAGNOSIS — F17200 Nicotine dependence, unspecified, uncomplicated: Secondary | ICD-10-CM | POA: Diagnosis not present

## 2016-08-20 LAB — POCT GLYCOSYLATED HEMOGLOBIN (HGB A1C): Hemoglobin A1C: 9.3

## 2016-08-20 MED ORDER — LORAZEPAM 0.5 MG PO TABS: 0.5000 mg | ORAL_TABLET | Freq: Two times a day (BID) | ORAL | 0 refills | Status: DC | PRN

## 2016-08-20 MED ORDER — LORAZEPAM 0.5 MG PO TABS: 0.2500 mg | ORAL_TABLET | Freq: Two times a day (BID) | ORAL | 0 refills | Status: DC | PRN

## 2016-08-20 NOTE — Progress Notes (Signed)
[facility] Progress Note  Subjective:  [name] is a 66 y.o. female with history of HTN, tobacco abuse, HLD, T2DM with peripheral neuropathy, and anxiety.  Diabetes: - thinks her A1c will be better today, as has been taking glipizide 10 mg twice daily (increased from once daily since June) along with metformin 1000 mg BID. Also says her waistline has decreased from 44 to 39 inches.  - says had 3 friends who died recently and stress affects how she eats. Has chronic stressor of living with husband who has dementia (though still working).  - not doing any aerobic exercise - wary of injection treatment options - sugars often in 200s  - says needs diabetic shoe forms filled out again because they are on backorder until October/November 2018 - continues to take gabapentin for burning leg pain though notes pain in her thighs when she first wakes up that improves with standing on a piece of newspaper (says this is an old wives' tale in which ink from newspaper helps draw out the cramp) ROS: No abdominal pain, no increased urinary frequency   L shoulder lump: - Says this has drained before and thinks it could be a little abscess - does not hurt - interested in removal.   Anxiety: - Wary of SSRI treatment because of side effect list and her use of tramadol and history of fatty liver - Was worse when on steroids for fluid behind her ears so stopped taking it; only treated for about 3 days  Tobacco abuse:  - Not interested in quitting at this time - Says would not be able to tolerate low dose CT scan for cancer screening because of anxiety in scanner in past   Allergies  Allergen Reactions  . Alcohol Swabs [Isopropyl Alcohol] Nausea Only    Use peroxide instead  . Amoxicillin-Pot Clavulanate Other (See Comments)    REACTION: Unsure - maybe swollen?  Did not agree with her.  . Penicillins Other (See Comments)    REACTION: Unsure what reaction she has.     Objective: Blood pressure 102/60, pulse 64, temperature 98.7 F (37.1 C), temperature source Oral, height 5\' 5"  (1.651 m), weight 171 lb (77.6 kg), SpO2 98 %. Body mass index is 28.46 kg/m. Constitutional: Overweight female in NAD HENT: MMM, fluid behind both ears but no erythema or bulging Cardiovascular: RRR, S1, S2, no m/r/g.  Pulmonary/Chest: Effort normal and breath sounds normal. No respiratory distress.  Musculoskeletal: No LE edema. Bilateral bunions.  Neurological: Sensation intact with monofilament testing Skin: Small ~ 1 cm somewhat firm mass over L shoulder with enlarged poor. Thickened toenails.   Psychiatric: Anxious affect.   Vitals reviewed  A1c: 08/20/16 - 9.3 05/19/16 - 9.2 02/27/16 - 8.6 11/26/15 - 8.8 05/07/15 - 8.3 01/30/15 - 8.5 11/07/14 - 8.1 05/21/14 - 7.6  Assessment/Plan: Diabetes mellitus with neuropathy (HCC) - Poorly controlled with progressive increase in A1c over last couple of years. - Patient does not seem to have appropriate level of concern about her sugars and has been resistant to suggestions about change to medications. She has a very good relationship with her local pharmacist, and side effects are often a concern when discussing making changes. Recommended patient meet with [facility] pharmacist Dr. Valentina Lucks for further discussion and overview of treatment options. Jardiance could be an oral option if affordable.  - Discussed role of diet and regular exercise in managing blood sugars.   Anxiety state - Negotiated trying decreased dose of ativan --  0.25 mg instead of 0.5 mg. Patient receptive to this strategy.  - Prescribed 0.5 mg, #60 with goal that this would last patient longer than the regular 1 month - Patient to call to let me know how often she is requiring full 0.5 mg dose at least a week before she runs out of medication  TOBACCO ABUSE - Not interested in quitting at this time, as helps her manage stress - Declines CT cancer screening  -  Counseled that this is likely contributing to eustachian tube dysfunction  Shoulder mass - Suspect cyst given enlarged pore vs abscess with history of it having drained pus - Will have her schedule with [facility] derm clinic for possible removal  Follow-up after appointment with Dr. Valentina Lucks.  [name], MD [place], PGY-3

## 2016-08-20 NOTE — Patient Instructions (Signed)
Ms. Howley,  Please try taking only half a tablet of ativan twice daily. Please call me about a week before your prescription is out to know how much you have been needing.  Please see Dr. Valentina Lucks to discuss blood sugar. He is our excellent pharmacist.  Best, Dr. Ola Spurr

## 2016-08-22 DIAGNOSIS — R2230 Localized swelling, mass and lump, unspecified upper limb: Secondary | ICD-10-CM | POA: Insufficient documentation

## 2016-08-22 NOTE — Assessment & Plan Note (Signed)
-   Poorly controlled with progressive increase in A1c over last couple of years. - Patient does not seem to have appropriate level of concern about her sugars and has been resistant to suggestions about change to medications. She has a very good relationship with her local pharmacist, and side effects are often a concern when discussing making changes. Recommended patient meet with [facility] pharmacist Dr. Valentina Lucks for further discussion and overview of treatment options. Jardiance could be an oral option if affordable.  - Discussed role of diet and regular exercise in managing blood sugars.

## 2016-08-22 NOTE — Assessment & Plan Note (Signed)
-   Negotiated trying decreased dose of ativan -- 0.25 mg instead of 0.5 mg. Patient receptive to this strategy.  - Prescribed 0.5 mg, #60 with goal that this would last patient longer than the regular 1 month - Patient to call to let me know how often she is requiring full 0.5 mg dose at least a week before she runs out of medication

## 2016-08-22 NOTE — Assessment & Plan Note (Signed)
-   Suspect cyst given enlarged pore vs abscess with history of it having drained pus - Will have her schedule with [facility] derm clinic for possible removal

## 2016-08-22 NOTE — Assessment & Plan Note (Signed)
-   Not interested in quitting at this time, as helps her manage stress - Declines CT cancer screening  - Counseled that this is likely contributing to eustachian tube dysfunction

## 2016-09-01 ENCOUNTER — Telehealth: Payer: Self-pay | Admitting: Internal Medicine

## 2016-09-01 NOTE — Telephone Encounter (Signed)
Pt brought in a paper at her visit 08-19-16 about her diabetic shoes. The authorization from the insurance runs out in [place]. Dr Ola Spurr needs to fill out the paper to extend it. The shoes are on back order and will not be in until Oct.  [facility] is the one that ordered the shoes.  Please talk to Liliane Channel at [facility] about this

## 2016-09-02 NOTE — Telephone Encounter (Signed)
Re-filled out paperwork and placed in fax pile to go to [place] and [place].

## 2016-09-03 ENCOUNTER — Ambulatory Visit: Payer: Medicare Other | Admitting: Pharmacist

## 2016-09-04 ENCOUNTER — Ambulatory Visit (INDEPENDENT_AMBULATORY_CARE_PROVIDER_SITE_OTHER): Payer: Medicare Other

## 2016-09-04 ENCOUNTER — Ambulatory Visit (INDEPENDENT_AMBULATORY_CARE_PROVIDER_SITE_OTHER): Payer: Medicare Other | Admitting: Podiatry

## 2016-09-04 ENCOUNTER — Encounter: Payer: Self-pay | Admitting: Podiatry

## 2016-09-04 DIAGNOSIS — T1490XA Injury, unspecified, initial encounter: Secondary | ICD-10-CM

## 2016-09-04 DIAGNOSIS — S90129A Contusion of unspecified lesser toe(s) without damage to nail, initial encounter: Secondary | ICD-10-CM

## 2016-09-04 DIAGNOSIS — E1140 Type 2 diabetes mellitus with diabetic neuropathy, unspecified: Secondary | ICD-10-CM

## 2016-09-04 DIAGNOSIS — B351 Tinea unguium: Secondary | ICD-10-CM

## 2016-09-04 NOTE — Progress Notes (Signed)
Patient ID: [name], female   DOB: November 20, 1950, 66 y.o.   MRN: [id number] Complaint:  Visit Type: Patient returns to my office for continued preventative foot care services. Complaint: Patient states" my nails have grown long and thick and become painful to walk and wear shoes" Patient has been diagnosed with DM with neuropathy.. The patient presents for preventative foot care services. No changes to ROS.  She also relates having stubbed her fifth toe right foot three days ago.  Patient says the swelling has lessened since the initial injury.  Podiatric Exam: Vascular: dorsalis pedis and posterior tibial pulses are palpable bilateral. Capillary return is immediate. Temperature gradient is WNL. Skin turgor WNL  Sensorium: Diminished  Semmes Weinstein monofilament test. Normal tactile sensation bilaterally. Nail Exam: Pt has thick disfigured discolored nails with subungual debris noted bilateral entire nail hallux through fifth toenails Ulcer Exam: There is no evidence of ulcer or pre-ulcerative changes or infection. Orthopedic Exam: Muscle tone and strength are WNL. No limitations in general ROM. No crepitus or effusions noted. Foot type and digits show no abnormalities. Bony prominences are unremarkable. Skin: No Porokeratosis. No infection or ulcers  Diagnosis:  Onychomycosis, , Pain in right toe, pain in left toes Toe contusion right nfoot.  Treatment & Plan Procedures and Treatment: Consent by patient was obtained for treatment procedures. The patient understood the discussion of treatment and procedures well. All questions were answered thoroughly reviewed. Debridement of mycotic and hypertrophic toenails, 1 through 5 bilateral and clearing of subungual debris. No ulceration, no infection noted. X-rays taken reveal no bony pathology fifth toe right foot..Buddy taped her toes.  Told to soak in tepid water. Return Visit-Office Procedure: Patient instructed to return to the office for a  follow up visit 3 months for continued evaluation and treatment.        Gardiner Barefoot DPM

## 2016-09-10 ENCOUNTER — Ambulatory Visit (INDEPENDENT_AMBULATORY_CARE_PROVIDER_SITE_OTHER): Payer: Medicare Other | Admitting: Pharmacist

## 2016-09-10 ENCOUNTER — Encounter: Payer: Self-pay | Admitting: Pharmacist

## 2016-09-10 DIAGNOSIS — E1140 Type 2 diabetes mellitus with diabetic neuropathy, unspecified: Secondary | ICD-10-CM | POA: Diagnosis not present

## 2016-09-10 NOTE — Assessment & Plan Note (Signed)
Diabetes longstanding currently uncontrolled with A1c 9.3. Patient denies hypoglycemic events and is able to verbalize appropriate hypoglycemia management plan. Patient reports adherence with medication. Performed diabetic foot exam in office, no loss of sensation identified. Diabetes control is suboptimal due to sedentary lifestyle and dietary indiscretions. Educated patient on several medication options including Jardiance, Trulicity, and Lantus. She deferred starting any new medication today.  She believes she can work harder at lifestyle modifications prior to adding any new medication. We discussed Jardiance, basal insulin and Trulicity options.  -No changes today. Patient will attempt lifestyle modifications including reduction of sweet foods and increased exercise.  - Plan to follow up in 1 month to check for blood sugar improvement. May consider initiation of Jardiance vs. Trulicity at that time.

## 2016-09-10 NOTE — Progress Notes (Signed)
    S:     Chief Complaint  Patient presents with  . Medication Management    diabetes    Patient arrives in good spirits, ambulating without assistance.  Presents for diabetes evaluation, education, and management at the request of Dr. Ola Spurr. Patient was referred on 08/20/16.  Patient was last seen by Primary Care Provider on 08/20/16.   Patient reports missed appointment last week due to ER visit with [place] bone in throat. She reports husband's physical and mental health is declining and she is very stressed about this (stress level 12 out of 10). She admits she may eat to avoid stress. She reports trouble sleeping and wakes up at 4:30AM. She reports a liking for "sweets" - cake, candy, pie. She reports starting glipizide last year.  Patient reports Diabetes was diagnosed in 2009. Pre-diabetic 2 years prior.  Family/Social History: mother with diabetes  Patient reports adherence with medications.  Current diabetes medications include: metformin 1000 mg BID, glipizide 10 mg BID, Current hypertension medications include: HCTZ 12.5 mg daily  Patient denied hypoglycemic events. She reports keeping glucose pills in purse knows that she feels "jittery" when she has low blood sugar.  Patient reported dietary habits: Eats 2-3 meals/day (sometimes skips lunch)   Patient reports neuropathy in both legs. She describes the pain as "restless" in her legs, and "aggravating" and "nagging" "throbbing" "achy" in her feet. Her feet bother her when she lays down. She also reports tingling in her fingers  O:  Physical Exam  Constitutional: She appears well-developed and well-nourished.  Vitals reviewed.  Review of Systems  Psychiatric/Behavioral:       Patient reports high stress due to husband's health  All other systems reviewed and are negative.   Lab Results  Component Value Date   HGBA1C 9.3 08/20/2016   Vitals:   09/10/16 1454  BP: 122/69  Pulse: 82     A/P: Diabetes  longstanding currently uncontrolled with A1c 9.3. Patient denies hypoglycemic events and is able to verbalize appropriate hypoglycemia management plan. Patient reports adherence with medication. Performed diabetic foot exam in office, no loss of sensation identified. Diabetes control is suboptimal due to sedentary lifestyle and dietary indiscretions. Educated patient on several medication options including Jardiance, Trulicity, and Lantus. She deferred starting any new medication today.  She believes she can work harder at lifestyle modifications prior to adding any new medication. We discussed Jardiance, basal insulin and Trulicity options.  -No changes today. Patient will attempt lifestyle modifications including reduction of sweet foods and increased exercise.  - Plan to follow up in 1 month to check for blood sugar improvement. May consider initiation of Jardiance vs. Trulicity at that time.  Next A1C anticipated November 2018.    Mood - appeared stressed and overwhelmed.  Offered her the option to see someone from [facility] for support - uninterested in a face-to-face today in office.    Written patient instructions provided.  Total time in face to face counseling 45 minutes.   Follow up in Pharmacist Clinic Visit in 1 month.   Patient seen with Waverly Ferrari, PharmD Candidate, and Charlene Brooke, PharmD PGY-1 Resident.

## 2016-09-10 NOTE — Patient Instructions (Addendum)
Thank you for coming in today!  1. We are not making any medication changes today. Continue all your medications as before. 2. Continue checking your blood sugar at home. 3. Reduce the amount of sweet foods in your diet and increase exercise.  Please return to Pharmacy Clinic in 1 month to re-assess diabetes medications. We are considering Jardiance or Trulicity for the future.

## 2016-09-11 NOTE — Progress Notes (Signed)
Patient ID: [name], female   DOB: [birth year], 66 y.o.   MRN: [id number] Reviewed: Agree with Dr. Graylin Shiver documentation and management.

## 2016-09-15 ENCOUNTER — Telehealth: Payer: Self-pay | Admitting: Licensed Clinical Social Worker

## 2016-09-15 NOTE — Progress Notes (Signed)
Integrated Care consult from Dr. Valentina Lucks reference  Patient experiencing stress with husband's health .   Left message to call LCSW.  Plan: LCSW will wait for return phone call.  Casimer Lanius, LCSW Licensed Clinical Social Worker [facility]   [phone] 2:10 PM

## 2016-09-18 ENCOUNTER — Telehealth: Payer: Self-pay | Admitting: Internal Medicine

## 2016-09-18 NOTE — Telephone Encounter (Signed)
Reached patient's husband by phone. He said patient was out but that he could take a message. Recommended ibuprofen and tylenol for inflammation and to keep foot elevated as much as possible.

## 2016-09-18 NOTE — Telephone Encounter (Signed)
Pt hurt her toe 3 weeks ago. She had it checked at [place] and it wasn't broken.  It is still swollen and sore. She wants to know what can be taken for the swelling.  Please advise Pt requested message to be sent to dr Valentina Lucks also

## 2016-09-21 NOTE — Telephone Encounter (Signed)
Patient called wanting to know what she could take to help the swelling of her toe.  Advised patient that she could take Ibuprofen 200 mg up to 800 mg every 8 hours as needed for pain and inflammation. Advised patient to elevate foot when seating and ice for about 5-10 minutes at a time.  Advised patient not to take Ibuprofen more than 5 days, if no relief, to call for an appointment.  Derl Barrow, RN

## 2016-10-05 ENCOUNTER — Encounter: Payer: Self-pay | Admitting: Internal Medicine

## 2016-10-05 NOTE — Progress Notes (Signed)
Completed documentation requested by [facility] and [place] for patient to receive diabetic shoes. Have completed this form multiple times.

## 2016-10-08 ENCOUNTER — Telehealth: Payer: Self-pay | Admitting: Internal Medicine

## 2016-10-08 NOTE — Telephone Encounter (Signed)
Pt called because we keep faxing the forms for her diabetic shoes and they do not receive the fax. This week our fax machine has not been working very well. She is on her way up here today to pick up the forms so that she can drop them off. Please leave them up front. jw

## 2016-10-19 ENCOUNTER — Ambulatory Visit: Payer: Medicare Other | Admitting: Pharmacist

## 2016-10-20 ENCOUNTER — Telehealth: Payer: Self-pay | Admitting: Internal Medicine

## 2016-10-20 DIAGNOSIS — F411 Generalized anxiety disorder: Secondary | ICD-10-CM

## 2016-10-20 NOTE — Telephone Encounter (Signed)
Pt is calling for a refill on her Lorazepam to be called into the [facility] on [address] or leave the prescription up front for pick up. jw

## 2016-10-21 ENCOUNTER — Other Ambulatory Visit: Payer: Self-pay | Admitting: Internal Medicine

## 2016-10-21 ENCOUNTER — Telehealth: Payer: Self-pay | Admitting: Internal Medicine

## 2016-10-21 DIAGNOSIS — F411 Generalized anxiety disorder: Secondary | ICD-10-CM

## 2016-10-21 MED ORDER — LORAZEPAM 0.5 MG PO TABS: 0.2500 mg | ORAL_TABLET | Freq: Two times a day (BID) | ORAL | 1 refills | Status: DC | PRN

## 2016-10-21 MED ORDER — LORAZEPAM 0.5 MG PO TABS: 0.5000 mg | ORAL_TABLET | Freq: Two times a day (BID) | ORAL | 2 refills | Status: DC | PRN

## 2016-10-21 NOTE — Telephone Encounter (Signed)
Pt needs to talk to Dr.Fitzgerald about her Lorazepam and the dosage. Please call ASAP to get this straighten out. jw

## 2016-10-21 NOTE — Telephone Encounter (Signed)
[facility] and gave orders for prescription.

## 2016-10-21 NOTE — Telephone Encounter (Signed)
Patient says she tried 0.25 mg ativan for three weeks and had significant anxiety symptoms, so she started taking 0.5 mg again. She requests that refill be changed to 0.5 mg. Informed her that I had called this into [facility] earlier today and to just take a full 0.5 mg tablet. She says [facility] told her otherwise. Have had extensive discussion about ativan not being the first line treatment of her anxiety, but patient feels she has very poor quality of life without medication or on lower dose. Will leave paper Rx for 0.5 mg tablets up front for patient to pick up tomorrow to limit confusion.  [name], MD [place], PGY-3

## 2016-10-22 ENCOUNTER — Ambulatory Visit (INDEPENDENT_AMBULATORY_CARE_PROVIDER_SITE_OTHER): Payer: Medicare Other | Admitting: Pharmacist

## 2016-10-22 ENCOUNTER — Ambulatory Visit (INDEPENDENT_AMBULATORY_CARE_PROVIDER_SITE_OTHER): Payer: Medicare Other | Admitting: Family Medicine

## 2016-10-22 VITALS — BP 122/72 | HR 77 | Temp 98.5°F | Ht 66.0 in | Wt 172.4 lb

## 2016-10-22 DIAGNOSIS — L720 Epidermal cyst: Secondary | ICD-10-CM | POA: Diagnosis not present

## 2016-10-22 DIAGNOSIS — E1140 Type 2 diabetes mellitus with diabetic neuropathy, unspecified: Secondary | ICD-10-CM

## 2016-10-22 DIAGNOSIS — L723 Sebaceous cyst: Secondary | ICD-10-CM | POA: Diagnosis not present

## 2016-10-22 NOTE — Progress Notes (Signed)
    S:     Chief Complaint  Patient presents with  . Medication Management    diabetes    Patient arrives self-ambulating and in good spirits.  Presents for diabetes evaluation, education, and management as follow-up from last appointment with Rx Clinic on 09/10/16. Patient was referred on 08/20/16.  Patient was last seen by Primary Care Provider on 10/22/16. At last Rx clinic visit, no changes made to regimen however multiple were discussed.   Since last visit patient reports she has tried to work on her diet some by cutting down on sweets. States that she recently went to [place] and traveled without her husband - this reduced stress significantly.   Patient reports Diabetes was diagnosed about 10 years ago.   Family/Social History: Mother had insulin-dependent DM  Patient reports adherence with medications.  Current diabetes medications include: Glipizide 10 mg twice daily and metformin 1000 mg twice daily. Current hypertension medications include: Hctz 12.5 mg daily and losartan 50 mg daily.  Patient denies hypoglycemic events.  Patient reported exercise habits: stretching and some/little walking   Patient reports checking BG 3x/day; Usually 130-150s in the morning, 160-180s before lunch, and some numbers in the 200s. Patient reports BG values being better since last visit on 09/10/16.  O:  Physical Exam  Constitutional: She appears well-developed and well-nourished.  Vitals reviewed.    Review of Systems  All other systems reviewed and are negative.    Lab Results  Component Value Date   HGBA1C 9.3 08/20/2016   There were no vitals filed for this visit.   10 year ASCVD risk: 24.9% (assuming LDL meets minimum requirement of 130).  A/P: Longstanding diabetes currently uncontrolled. Patient denies hypoglycemic events and is able to verbalize appropriate hypoglycemia management plan. Patient reports adherence with medication. Control suboptimal due to pancreatic  insufficiency, dietary indiscretion, sedentary lifestyle.  We suggested starting Trulicity (dulaglutide) 0.75 mg subQ weekly, however patient declined change at this time. Patient drug handout provided at her request for Trulicity. Patient agreed to work on increasing her exercise and decreasing her sugar intake. Patient also agreed to considering starting Trulicity at her next follow-up appointment if her next A1c is above 8%. Next A1C anticipated mid-November.    ASCVD risk greater than 7.5%. Continued Aspirin 81 mg and Continued atorvastatin 40 mg.   Longstanding hypertension currently controlled (BP assessed at MD visit immediately prior to Rx Clinic visit).  Patient reports adherence with medication. Continue hctz 12.5 mg daily and losartan 50 mg daily.  Written patient instructions provided.  Total time in face to face counseling 25 minutes.   Follow up in Pharmacist Clinic Visit in late November.   Patient seen with Drusilla Kanner, PharmD Candidate and Deirdre Pippins, PharmD, PGY2 Resident.

## 2016-10-22 NOTE — Telephone Encounter (Signed)
Called patient back evening of 10/21/16. She reports her anxiety symptoms were not controlled on lower dose of ativan. Have had extensive discussions about this not being a long-term therapy for anxiety/PTSD, but patient has been on ativan for many years. Placed refill at previous dose of 0.5 mg BID prn. She has been resistant to seeing psychiatry for a second opinion. Will continue to recommend SSRI.

## 2016-10-22 NOTE — Progress Notes (Signed)
Patient c/o bump on the back of her left shoulder  Which has been there for more than 12 yrs. The bump does not bother her but recently it drained some liquid for a while and stopped. She just wanted to know what it is.  She also has a a smaller cyst on the right side of her back which has been there for years. Her husband usually help milk it out once in a while .  Today she requested removal of the cyst on the right side of her back and not the one on the left.  Sebaceous cyst:  Inclusion cyst:      A/P:  1. Sebaceous cyst: Defer removal for now.  2. Epidermoid/inclusion cyst: Informed consent obtained.                                 Procedure performed by Dr. Vanetta Shawl under my supervision.                                 See procedure note below.

## 2016-10-22 NOTE — Progress Notes (Signed)
Inclusion Cyst Excision Procedure Note  Pre-operative Diagnosis: inclusion cyst  Post-operative Diagnosis: same  Locations:medial, middle back  Indications: irritating to patient  Anesthesia: not required    Procedure Details  History of allergy to iodine: no  Patient informed of the risks (including bleeding and infection) and benefits of the  procedure and Written informed consent obtained.  The lesion and surrounding area was given a sterile prep using betadyne. The cyst was probed with forceps and small amount of typical sebaceous material was removed. Betadyne and a sterile dressing applied.  The specimen was not sent for pathologic examination. The patient tolerated the procedure well.  EBL: 0 ml  Findings: none  Condition: Stable  Complications: none.  Plan: 1. Instructed to keep the wound dry and covered for 24-48h and clean thereafter. 2. Warning signs of infection were reviewed.   3. Recommended that the patient use OTC acetaminophen as needed for pain.  4. Follow up as needed   Lucila Maine, DO PGY-2, [facility] 10/22/2016 3:00 PM

## 2016-10-22 NOTE — Assessment & Plan Note (Signed)
Longstanding diabetes currently uncontrolled. Patient denies hypoglycemic events and is able to verbalize appropriate hypoglycemia management plan. Patient reports adherence with medication. Control suboptimal due to pancreatic insufficiency, dietary indiscretion, sedentary lifestyle.  We suggested starting Trulicity (dulaglutide) 0.75 mg subQ weekly, however patient declined change at this time. Patient drug handout provided at her request for Trulicity. Patient agreed to work on increasing her exercise and decreasing her sugar intake. Patient also agreed to considering starting Trulicity at her next follow-up appointment if her next A1c is above 8%. Next A1C anticipated mid-November

## 2016-10-22 NOTE — Patient Instructions (Signed)
   It was great seeing you today!  Please let us know if you would like to remove the sebaceous cyst on your shoulder.  If you have questions or concerns please do not hesitate to call at [phone].  Lucila Maine, DO PGY-2, [facility] 10/22/2016 2:49 PM   Epidermal Cyst An epidermal cyst is a small, painless lump under your skin. It may be called an epidermal inclusion cyst or an infundibular cyst. The cyst contains a grayish-white, bad-smelling substance (keratin). It is important not to pop epidermal cysts yourself. These cysts are usually harmless (benign), but they can get infected. Symptoms of infection may include:  Redness.  Inflammation.  Tenderness.  Warmth.  Fever.  A grayish-white, bad-smelling substance draining from the cyst.  Pus draining from the cyst.  Follow these instructions at home:  Take over-the-counter and prescription medicines only as told by your doctor.  If you were prescribed an antibiotic, use it as told by your doctor. Do not stop using the antibiotic even if you start to feel better.  Keep the area around your cyst clean and dry.  Wear loose, dry clothing.  Do not try to pop your cyst.  Avoid touching your cyst.  Check your cyst every day for signs of infection.  Keep all follow-up visits as told by your doctor. This is important. How is this prevented?  Wear clean, dry, clothing.  Avoid wearing tight clothing.  Keep your skin clean and dry. Shower or take baths every day.  Wash your body with a benzoyl peroxide wash when you shower or bathe. Contact a health care provider if:  Your cyst has symptoms of infection.  Your condition is not improving or is getting worse.  You have a cyst that looks different from other cysts you have had.  You have a fever. Get help right away if:  Redness spreads from the cyst into the surrounding area. This information is not intended to replace advice given to you  by your health care provider. Make sure you discuss any questions you have with your health care provider. Document Released: 02/06/2004 Document Revised: 08/28/2015 Document Reviewed: 10/31/2014 Elsevier Interactive Patient Education  [employer].

## 2016-10-22 NOTE — Patient Instructions (Addendum)
Thanks for coming to see [name] today!  We want you to work on increasing your exercise and decreasing your sugar intake.   Please follow-up with [name] in late November at the pharmacy clinic.

## 2016-10-26 NOTE — Progress Notes (Signed)
Patient ID: [name], female   DOB: April 24, 1950, 66 y.o.   MRN: [id number] Reviewed: Agree with Dr. Graylin Shiver documentation and management.

## 2016-11-10 ENCOUNTER — Ambulatory Visit (INDEPENDENT_AMBULATORY_CARE_PROVIDER_SITE_OTHER): Payer: Medicare Other | Admitting: Orthotics

## 2016-11-10 DIAGNOSIS — E1140 Type 2 diabetes mellitus with diabetic neuropathy, unspecified: Secondary | ICD-10-CM | POA: Diagnosis not present

## 2016-11-10 DIAGNOSIS — M2010 Hallux valgus (acquired), unspecified foot: Secondary | ICD-10-CM | POA: Diagnosis not present

## 2016-11-10 DIAGNOSIS — E1342 Other specified diabetes mellitus with diabetic polyneuropathy: Secondary | ICD-10-CM | POA: Diagnosis not present

## 2016-11-10 DIAGNOSIS — L851 Acquired keratosis [keratoderma] palmaris et plantaris: Secondary | ICD-10-CM | POA: Diagnosis not present

## 2016-11-17 ENCOUNTER — Ambulatory Visit: Payer: Medicare Other | Admitting: Podiatry

## 2016-11-17 ENCOUNTER — Encounter: Payer: Self-pay | Admitting: Podiatry

## 2016-11-17 DIAGNOSIS — B351 Tinea unguium: Secondary | ICD-10-CM

## 2016-11-17 DIAGNOSIS — E1140 Type 2 diabetes mellitus with diabetic neuropathy, unspecified: Secondary | ICD-10-CM

## 2016-11-17 DIAGNOSIS — M2010 Hallux valgus (acquired), unspecified foot: Secondary | ICD-10-CM

## 2016-11-17 NOTE — Progress Notes (Signed)
Patient ID: [name], female   DOB: 05/16/1950, 66 y.o.   MRN: [id number] Complaint:  Visit Type: Patient returns to my office for continued preventative foot care services. Complaint: Patient states" my nails have grown long and thick and become painful to walk and wear shoes" Patient has been diagnosed with DM with neuropathy.. The patient presents for preventative foot care services. No changes to ROS.    Podiatric Exam: Vascular: dorsalis pedis and posterior tibial pulses are palpable bilateral. Capillary return is immediate. Temperature gradient is WNL. Skin turgor WNL  Sensorium: Diminished  Semmes Weinstein monofilament test. Normal tactile sensation bilaterally. Nail Exam: Pt has thick disfigured discolored nails with subungual debris noted bilateral entire nail hallux through fifth toenails Ulcer Exam: There is no evidence of ulcer or pre-ulcerative changes or infection. Orthopedic Exam: Muscle tone and strength are WNL. No limitations in general ROM. No crepitus or effusions noted. Foot type and digits show no abnormalities. Bony prominences are unremarkable. Skin: No Porokeratosis. No infection or ulcers  Diagnosis:  Onychomycosis, , Pain in right toe, pain in left toe  Treatment & Plan Procedures and Treatment: Consent by patient was obtained for treatment procedures. The patient understood the discussion of treatment and procedures well. All questions were answered thoroughly reviewed. Debridement of mycotic and hypertrophic toenails, 1 through 5 bilateral and clearing of subungual debris. No ulceration, no infection noted.  Return Visit-Office Procedure: Patient instructed to return to the office for a follow up visit 10 weeks  for continued evaluation and treatment.        Gardiner Barefoot DPM

## 2016-11-21 NOTE — Progress Notes (Signed)

## 2016-11-24 ENCOUNTER — Other Ambulatory Visit: Payer: Self-pay | Admitting: Internal Medicine

## 2016-11-24 DIAGNOSIS — E1140 Type 2 diabetes mellitus with diabetic neuropathy, unspecified: Secondary | ICD-10-CM

## 2016-11-27 ENCOUNTER — Encounter: Payer: Self-pay | Admitting: Internal Medicine

## 2016-11-27 ENCOUNTER — Other Ambulatory Visit: Payer: Self-pay

## 2016-11-27 ENCOUNTER — Ambulatory Visit (INDEPENDENT_AMBULATORY_CARE_PROVIDER_SITE_OTHER): Payer: Medicare Other | Admitting: Internal Medicine

## 2016-11-27 VITALS — BP 131/80 | HR 86 | Temp 98.8°F | Ht 66.0 in | Wt 175.2 lb

## 2016-11-27 DIAGNOSIS — I10 Essential (primary) hypertension: Secondary | ICD-10-CM | POA: Diagnosis not present

## 2016-11-27 DIAGNOSIS — F17200 Nicotine dependence, unspecified, uncomplicated: Secondary | ICD-10-CM | POA: Diagnosis not present

## 2016-11-27 DIAGNOSIS — L309 Dermatitis, unspecified: Secondary | ICD-10-CM | POA: Diagnosis not present

## 2016-11-27 DIAGNOSIS — K219 Gastro-esophageal reflux disease without esophagitis: Secondary | ICD-10-CM

## 2016-11-27 DIAGNOSIS — E1140 Type 2 diabetes mellitus with diabetic neuropathy, unspecified: Secondary | ICD-10-CM | POA: Diagnosis not present

## 2016-11-27 DIAGNOSIS — G6289 Other specified polyneuropathies: Secondary | ICD-10-CM

## 2016-11-27 DIAGNOSIS — F411 Generalized anxiety disorder: Secondary | ICD-10-CM | POA: Diagnosis not present

## 2016-11-27 DIAGNOSIS — M62830 Muscle spasm of back: Secondary | ICD-10-CM | POA: Diagnosis not present

## 2016-11-27 DIAGNOSIS — R42 Dizziness and giddiness: Secondary | ICD-10-CM | POA: Diagnosis not present

## 2016-11-27 LAB — POCT GLYCOSYLATED HEMOGLOBIN (HGB A1C): Hemoglobin A1C: 7.8

## 2016-11-27 MED ORDER — SUCRALFATE 1 GM/10ML PO SUSP: 1.0000 g | Freq: Four times a day (QID) | ORAL | 0 refills | Status: DC | PRN

## 2016-11-27 MED ORDER — BACLOFEN 10 MG PO TABS: 10.0000 mg | ORAL_TABLET | Freq: Three times a day (TID) | ORAL | 1 refills | Status: DC | PRN

## 2016-11-27 MED ORDER — TRAMADOL HCL 50 MG PO TABS: 50.0000 mg | ORAL_TABLET | Freq: Three times a day (TID) | ORAL | 2 refills | Status: DC | PRN

## 2016-11-27 MED ORDER — LOSARTAN POTASSIUM 50 MG PO TABS: 50.0000 mg | ORAL_TABLET | Freq: Every day | ORAL | 2 refills | Status: DC

## 2016-11-27 MED ORDER — ACETAMINOPHEN 325 MG PO TABS: 650.0000 mg | ORAL_TABLET | Freq: Four times a day (QID) | ORAL | 1 refills | Status: DC | PRN

## 2016-11-27 MED ORDER — ATORVASTATIN CALCIUM 40 MG PO TABS: 40.0000 mg | ORAL_TABLET | Freq: Every day | ORAL | 2 refills | Status: DC

## 2016-11-27 MED ORDER — ATENOLOL 25 MG PO TABS: ORAL_TABLET | ORAL | 2 refills | Status: DC

## 2016-11-27 MED ORDER — LORAZEPAM 0.5 MG PO TABS: 0.5000 mg | ORAL_TABLET | Freq: Two times a day (BID) | ORAL | 2 refills | Status: DC | PRN

## 2016-11-27 MED ORDER — ASPIRIN 81 MG PO CHEW: 81.0000 mg | CHEWABLE_TABLET | Freq: Every day | ORAL | 6 refills | Status: DC

## 2016-11-27 MED ORDER — MOMETASONE FUROATE 50 MCG/ACT NA SUSP: 2.0000 | Freq: Every day | NASAL | 3 refills | Status: DC

## 2016-11-27 MED ORDER — ONETOUCH ULTRASOFT LANCETS MISC: 5 refills | Status: DC

## 2016-11-27 MED ORDER — GABAPENTIN 300 MG PO CAPS: 600.0000 mg | ORAL_CAPSULE | Freq: Two times a day (BID) | ORAL | 2 refills | Status: DC | PRN

## 2016-11-27 MED ORDER — ONETOUCH ULTRA BLUE VI STRP: ORAL_STRIP | 12 refills | Status: DC

## 2016-11-27 MED ORDER — PANTOPRAZOLE SODIUM 40 MG PO TBEC: 40.0000 mg | DELAYED_RELEASE_TABLET | Freq: Every day | ORAL | 2 refills | Status: DC

## 2016-11-27 MED ORDER — TRIAMCINOLONE ACETONIDE 0.1 % EX CREA: 1.0000 "application " | TOPICAL_CREAM | Freq: Two times a day (BID) | CUTANEOUS | 1 refills | Status: DC

## 2016-11-27 MED ORDER — POLYETHYLENE GLYCOL 3350 17 G PO PACK: 17.0000 g | PACK | ORAL | 1 refills | Status: DC

## 2016-11-27 MED ORDER — METFORMIN HCL 1000 MG PO TABS: 1000.0000 mg | ORAL_TABLET | Freq: Two times a day (BID) | ORAL | 2 refills | Status: DC

## 2016-11-27 MED ORDER — GLIPIZIDE 10 MG PO TABS: ORAL_TABLET | ORAL | 5 refills | Status: DC

## 2016-11-27 MED ORDER — HYDROCHLOROTHIAZIDE 12.5 MG PO CAPS: 12.5000 mg | ORAL_CAPSULE | Freq: Every day | ORAL | 2 refills | Status: DC

## 2016-11-27 MED ORDER — MECLIZINE HCL 25 MG PO TABS
25.0000 mg | ORAL_TABLET | Freq: Two times a day (BID) | ORAL | 2 refills | Status: DC | PRN
Start: 2016-11-27 — End: 2017-03-02

## 2016-11-27 NOTE — Patient Instructions (Signed)
Ms. Amparo,  Your blood sugar looks great today with A1c of 7.8. Be mindful of sweets during the holidays. Continue glipizide and metformin twice daily.  Please see me back in 3 months for blood sugar check and ativan refill.  I will fill out the diabetic shoe forms when I receive them.  Best, Dr. Ola Spurr

## 2016-11-27 NOTE — Progress Notes (Signed)
[name] Progress Note  Subjective:  [name] is a 66 y.o. female with history of HTN, tobacco abuse, HLD, T2DM with peripheral neuropathy, and anxiety who presents to follow-up diabetes.   #Diabetes: - Has been trying to eat fewer sweets and has been doing more strengthening exercises at home - CBGs last few mornings have been 135, 122, and 112.  - Patient says she has seen Opthmalmologist Dr. 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  - Received diabetic shoes in October and will qualify for another pair in January 2019 - Taking metformin 1000 mg BID, glipizide 10 mg BID - Has seen Dr. Valentina Lucks about other diabetic therapy options; trulicity suggested at last visit if next A1c remained above 8. Patient very opposed to idea of injections.  ROS: No abdominal pain, no increased urinary frequency  Allergies  Allergen Reactions  . Alcohol Swabs [Isopropyl Alcohol] Nausea Only    Use peroxide instead  . Penicillins Other (See Comments)    Arm swelling  . Prednisone Other (See Comments)    Could not sleep  . Amoxicillin-Pot Clavulanate Other (See Comments)    She is unsure what reaction she had    Social History   Tobacco Use  . Smoking status: Current Every Day Smoker    Packs/day: 1.00    Types: Cigarettes  . Smokeless tobacco: Never Used  Substance Use Topics  . Alcohol use: Yes    Alcohol/week: 7.0 oz    Types: 14 Standard drinks or equivalent per week    Comment: beer every 3-4 months     Objective: Blood pressure 131/80, pulse 86, temperature 98.8 F (37.1 C), temperature source Oral, height 5\' 6"  (1.676 m), weight 175 lb 3.2 oz (79.5 kg), SpO2 99 %. Constitutional: Overweight female in NAD Cardiovascular: RRR, S1, S2, no m/r/g.  Pulmonary/Chest: Effort normal and breath sounds normal.  Musculoskeletal: Dry skin of plantar surface of feet bilaterally without cracking. Bilateral bunions. No LE edema.   Skin: Thickened toenails.  Neurological: AOx3, no focal deficits. Vitals reviewed  Assessment/Plan: Diabetes mellitus with neuropathy (HCC) - A1c improved today at 7.8, down from 9.3 about 3 months ago - Continue current treatment regimen of 1000 mg metformin, 10 mg glipizide BID - Encouraged patient to be careful over the holidays with sweets  TOBACCO ABUSE - Not interested in cessation at this time. Says will quit when she decides.    Follow-up in 3 months for diabetes check-up and for ativan refill.  HM: Says already has mammogram scheduled for next month. Flu shot given at patient's last allergy appointment through [facility].   Tammy Floss, MD [place], PGY-3

## 2016-11-29 ENCOUNTER — Encounter: Payer: Self-pay | Admitting: Internal Medicine

## 2016-11-29 NOTE — Assessment & Plan Note (Signed)
-   Not interested in cessation at this time. Says will quit when she decides.

## 2016-11-29 NOTE — Assessment & Plan Note (Signed)
-   A1c improved today at 7.8, down from 9.3 about 3 months ago - Continue current treatment regimen of 1000 mg metformin, 10 mg glipizide BID - Encouraged patient to be careful over the holidays with sweets

## 2016-12-25 ENCOUNTER — Other Ambulatory Visit: Payer: Self-pay | Admitting: Internal Medicine

## 2016-12-25 DIAGNOSIS — E1140 Type 2 diabetes mellitus with diabetic neuropathy, unspecified: Secondary | ICD-10-CM

## 2017-01-11 ENCOUNTER — Ambulatory Visit (HOSPITAL_COMMUNITY)
Admission: EM | Admit: 2017-01-11 | Discharge: 2017-01-11 | Disposition: A | Discharge status: Home / Self Care | Payer: Medicare Other | Attending: Emergency Medicine | Admitting: Emergency Medicine

## 2017-01-11 ENCOUNTER — Encounter (HOSPITAL_COMMUNITY): Payer: Self-pay | Admitting: Family Medicine

## 2017-01-11 DIAGNOSIS — J069 Acute upper respiratory infection, unspecified: Secondary | ICD-10-CM

## 2017-01-11 MED ORDER — AZITHROMYCIN 250 MG PO TABS: ORAL_TABLET | ORAL | 0 refills | Status: DC

## 2017-01-11 NOTE — Discharge Instructions (Signed)
You likely having a viral upper respiratory infection. We recommended symptom control. I expect your symptoms to start improving in the next 1-2 weeks. May pick up azithromycin on Friday 1/4.  1. Take a daily allergy pill/anti-histamine like Zyrtec, Claritin, or Store brand consistently for 2 weeks  2. For congestion you may try an oral decongestant like Mucinex or sudafed. You may also try intranasal flonase nasal spray or saline irrigations (neti pot, sinus cleanse)  3. For your sore throat you may try cepacol lozenges, salt water gargles, throat spray. Treatment of congestion may also help your sore throat.  4. For cough you may try Diabetic cough medicine  5. Take Tylenol or Ibuprofen to help with pain/inflammation  6. Stay hydrated, drink plenty of fluids to keep throat coated and less irritated  Honey Tea For cough/sore throat try using a honey-based tea. Use 3 teaspoons of honey with juice squeezed from half lemon. Place shaved pieces of ginger into 1/2-1 cup of water and warm over stove top. Then mix the ingredients and repeat every 4 hours as needed.

## 2017-01-11 NOTE — ED Triage Notes (Signed)
Pt here for 5 days of nasal congestion, cough and mucous with facial pain. Pt has been using tussinex.

## 2017-01-11 NOTE — ED Provider Notes (Signed)
[place]    CSN: [id number] Arrival date & time: 01/11/17  1342     History   Chief Complaint Chief Complaint  Patient presents with  . Facial Pain  . Nasal Congestion    HPI [name] is a 66 y.o. female presenting with URI symptoms of cough, congestions, mucous production, facial pressure, occasional dizziness. Symptoms began 6 days ago. Using a diabetic cough medicine. Denies fever, nausea, vomiting, abdominal pain, shortness of breath, chest pain.   HPI  Past Medical History:  Diagnosis Date  . Allergy   . Anxiety   . AR (allergic rhinitis)   . Asthma   . Cataract   . CHF (congestive heart failure) ([place])   . Colon polyps    hyperplastic  . Disturbance of skin sensation    facial paresthesia; left  . DM2 (diabetes mellitus, type 2) ([place])   . Dyslipidemia   . GERD (gastroesophageal reflux disease)   . Hemorrhoids   . Hyperlipidemia   . Hypertension    essential, benign   . Insomnia   . Menopausal syndrome   . Palpitations    hx  . Routine general medical examination at a health care facility   . Screening for malignant neoplasm of the cervix   . Skin lesion   . SVT (supraventricular tachycardia) ([place])   . Tobacco abuse   . Weakness     Patient Active Problem List   Diagnosis Date Noted  . Shoulder mass 08/22/2016  . Stye 05/21/2016  . Leg cramps 03/22/2016  . Dizziness 03/22/2016  . Hip pain 11/28/2015  . Eczema 05/07/2015  . Fatty infiltration of liver 05/22/2014  . Chronic low back pain 05/21/2014  . S/P ablation of atrial flutter 01/09/2014  . Peripheral neuropathy 12/01/2013  . Dysphagia 02/24/2013  . Extrinsic asthma 09/29/2012  . Onychomycosis 06/10/2012  . Overweight 08/16/2008  . TOBACCO ABUSE 11/03/2007  . Essential hypertension, benign 10/18/2007  . Anxiety state 10/03/2007  . Diabetes mellitus with neuropathy ([employer]) 08/18/2007  . DYSLIPIDEMIA 02/17/2007  . Allergic rhinitis 07/21/2006  . GERD 07/21/2006     Past Surgical History:  Procedure Laterality Date  . ABDOMINAL HYSTERECTOMY  1990   For fibroids benign.  Still has ovaries  . ablation for SVT  9/09   Dr. Lovena Le  . APPENDECTOMY    . COLONOSCOPY  2009  . dental extractions    . SIGMOIDOSCOPY  2004    OB History    No data available       Home Medications    Prior to Admission medications   Medication Sig Start Date End Date Taking? Authorizing Provider  acetaminophen (TYLENOL) 325 MG tablet Take 2 tablets (650 mg total) every 6 (six) hours as needed by mouth for moderate pain. 11/27/16   Rogue Bussing, MD  aspirin 81 MG chewable tablet Chew 1 tablet (81 mg total) daily by mouth. 11/27/16   Rogue Bussing, MD  atenolol (TENORMIN) 25 MG tablet TAKE 1 TABLET(S) BY MOUTH DAILY 11/27/16   Rogue Bussing, MD  atorvastatin (LIPITOR) 40 MG tablet Take 1 tablet (40 mg total) daily by mouth. 11/27/16   Rogue Bussing, MD  azithromycin (ZITHROMAX) 250 MG tablet Take first 2 today, then 1 every day until finished. 01/11/17   Viola Kinnick C, PA-C  baclofen (LIORESAL) 10 MG tablet Take 1 tablet (10 mg total) 3 (three) times daily as needed by mouth for muscle spasms. 11/27/16   [name] Gillham,  MD  gabapentin (NEURONTIN) 300 MG capsule Take 2-3 capsules (600-900 mg total) 2 (two) times daily as needed by mouth. 11/27/16   Rogue Bussing, MD  glipiZIDE (GLUCOTROL) 10 MG tablet TAKE ONE TABLET BY MOUTH TWICE A DAY BEFORE A MEAL 11/27/16   Rogue Bussing, MD  glipiZIDE (GLUCOTROL) 10 MG tablet TAKE ONE TABLET BY MOUTH TWICE A DAY BEFORE A MEAL 12/28/16   Rogue Bussing, MD  hydrochlorothiazide (MICROZIDE) 12.5 MG capsule Take 1 capsule (12.5 mg total) daily by mouth. 11/27/16   Rogue Bussing, MD  Lancets (ONETOUCH ULTRASOFT) lancets CHECK BLOOD SUGAR 3 TIMES DAILY. 11/27/16   Rogue Bussing, MD  LORazepam (ATIVAN) 0.5 MG tablet Take 1 tablet  (0.5 mg total) 2 (two) times daily as needed by mouth for anxiety. 11/27/16   Rogue Bussing, MD  losartan (COZAAR) 50 MG tablet Take 1 tablet (50 mg total) daily by mouth. 11/27/16   Rogue Bussing, MD  meclizine (ANTIVERT) 25 MG tablet Take 1-2 tablets (25-50 mg total) 2 (two) times daily as needed by mouth for dizziness. if having symptoms.  May repeat in 12 hours later. 11/27/16   Rogue Bussing, MD  metFORMIN (GLUCOPHAGE) 1000 MG tablet Take 1 tablet (1,000 mg total) 2 (two) times daily with a meal by mouth. 11/27/16   Rogue Bussing, MD  mometasone (NASONEX) 50 MCG/ACT nasal spray Place 2 sprays daily into the nose. 11/27/16   Rogue Bussing, MD  ONE [facility] ULTRA TEST test strip Check blood sugar up to 3 times daily. 11/27/16   Rogue Bussing, MD  pantoprazole (PROTONIX) 40 MG tablet Take 1 tablet (40 mg total) daily by mouth. 11/27/16   Rogue Bussing, MD  polyethylene glycol [facility] / Floria Raveling) packet Take 17 g every other day by mouth. 11/27/16   Rogue Bussing, MD  sucralfate (CARAFATE) 1 GM/10ML suspension Take 10 mLs (1 g total) 4 (four) times daily as needed by mouth (reflux). 11/27/16   Rogue Bussing, MD  traMADol (ULTRAM) 50 MG tablet Take 1 tablet (50 mg total) every 8 (eight) hours as needed by mouth for moderate pain or severe pain. 11/27/16   Rogue Bussing, MD  triamcinolone cream (KENALOG) 0.1 % Apply 1 application 2 (two) times daily topically. For up to 2 weeks then stop. 11/27/16   Rogue Bussing, MD    Family History Family History  Problem Relation Age of Onset  . Heart attack Father 30  . Hypertension Mother   . Stroke Mother   . Diabetes Mother   . Stomach cancer Mother   . Cancer Mother   . Coronary artery disease Unknown        female, 1st degree relative <50  . Stomach cancer Maternal Aunt     Social History Social History   Tobacco Use  . Smoking  status: Current Every Day Smoker    Packs/day: 1.00    Types: Cigarettes  . Smokeless tobacco: Never Used  Substance Use Topics  . Alcohol use: Yes    Alcohol/week: 7.0 oz    Types: 14 Standard drinks or equivalent per week    Comment: beer every 3-4 months   . Drug use: No     Allergies   Alcohol swabs [isopropyl alcohol]; Penicillins; Prednisone; and Amoxicillin-pot clavulanate   Review of Systems Review of Systems  Constitutional: Negative for chills, fatigue and fever.  HENT: Positive for congestion, ear pain, rhinorrhea, sinus pressure and  sore throat. Negative for trouble swallowing.   Respiratory: Positive for cough. Negative for chest tightness and shortness of breath.   Cardiovascular: Negative for chest pain.  Gastrointestinal: Negative for abdominal pain, nausea and vomiting.  Musculoskeletal: Negative for myalgias.  Skin: Negative for rash.  Neurological: Positive for dizziness. Negative for light-headedness and headaches.     Physical Exam Triage Vital Signs ED Triage Vitals [01/11/17 1433]  Enc Vitals Group     BP 127/82     Pulse Rate 83     Resp 18     Temp 98.6 F (37 C)     Temp src      SpO2 100 %     Weight      Height      Head Circumference      Peak Flow      Pain Score      Pain Loc      Pain Edu?      Excl. in [place]?    No data found.  Updated Vital Signs BP 127/82   Pulse 83   Temp 98.6 F (37 C)   Resp 18   SpO2 100%   Visual Acuity Right Eye Distance:   Left Eye Distance:   Bilateral Distance:    Right Eye Near:   Left Eye Near:    Bilateral Near:     Physical Exam  Constitutional: She appears well-developed and well-nourished. No distress.  HENT:  Head: Normocephalic and atraumatic.  Right Ear: Tympanic membrane and ear canal normal.  Left Ear: Tympanic membrane and ear canal normal.  Nose: Nose normal.  Mouth/Throat: Uvula is midline. No oral lesions. No trismus in the jaw. No uvula swelling. Posterior  oropharyngeal erythema present. No tonsillar exudate.  Eyes: Conjunctivae are normal.  Neck: Neck supple.  Cardiovascular: Normal rate and regular rhythm.  No murmur heard. Pulmonary/Chest: Effort normal and breath sounds normal. No respiratory distress.  Abdominal: Soft. There is no tenderness.  Musculoskeletal: She exhibits no edema.  Neurological: She is alert.  Skin: Skin is warm and dry.  Psychiatric: She has a normal mood and affect.  Nursing note and vitals reviewed.    UC Treatments / Results  Labs (all labs ordered are listed, but only abnormal results are displayed) Labs Reviewed - No data to display  EKG  EKG Interpretation None       Radiology No results found.  Procedures Procedures (including critical care time)  Medications Ordered in UC Medications - No data to display   Initial Impression / Assessment and Plan / UC Course  I have reviewed the triage vital signs and the nursing notes.  Pertinent labs & imaging results that were available during my care of the patient were reviewed by me and considered in my medical decision making (see chart for details).     Patient presents with symptoms likely from a viral upper respiratory infection. Differential includes bacterial pneumonia, sinusitis, allergic rhinitis, acute bronchitis. Do not suspect underlying cardiopulmonary process. Symptoms seem unlikely related to ACS, CHF or COPD exacerbations, pneumonia, pneumothorax. Patient is nontoxic appearing and not in need of emergent medical intervention.  Recommended symptom control with over the counter medications: Daily oral anti-histamine, Oral decongestant or IN corticosteroid, saline irrigations, cepacol lozenges, Robitussin, Delsym, honey tea, Diabetic cough medicine. May fill azithromycin prescription on Friday if not improving with symptom control.  Return if symptoms fail to improve in 1-2 weeks or you develop shortness of breath, chest pain, severe  headache.  Patient states understanding and is agreeable.  Discharged with PCP followup.   Final Clinical Impressions(s) / UC Diagnoses   Final diagnoses:  Viral upper respiratory tract infection    ED Discharge Orders        Ordered    azithromycin (ZITHROMAX) 250 MG tablet     01/11/17 1619       Controlled Substance Prescriptions [place] Controlled Substance Registry consulted? Not Applicable   Janith Lima, [place] 01/11/17 1626

## 2017-01-12 HISTORY — PX: COLONOSCOPY: SHX174

## 2017-01-26 ENCOUNTER — Other Ambulatory Visit: Payer: Self-pay

## 2017-01-26 ENCOUNTER — Encounter: Payer: Self-pay | Admitting: Family Medicine

## 2017-01-26 ENCOUNTER — Ambulatory Visit (INDEPENDENT_AMBULATORY_CARE_PROVIDER_SITE_OTHER): Payer: Medicare Other | Admitting: Family Medicine

## 2017-01-26 ENCOUNTER — Ambulatory Visit: Payer: Medicare Other | Admitting: Podiatry

## 2017-01-26 DIAGNOSIS — R1032 Left lower quadrant pain: Secondary | ICD-10-CM | POA: Diagnosis not present

## 2017-01-26 DIAGNOSIS — N898 Other specified noninflammatory disorders of vagina: Secondary | ICD-10-CM | POA: Diagnosis not present

## 2017-01-26 NOTE — Progress Notes (Signed)
   Subjective   Patient ID: [name]    DOB: Aug 26, 1950, 67 y.o. female   MRN: [id number]  CC: "Abdominal pain"  HPI: [name] is a 67 y.o. female who presents to clinic today for the following:  Abdominal pain: Onset 2 days ago when patient began feeling intermittent aching pain in her left lower quadrant of her abdomen radiating to her back.  Was a severity of 8/10.  She took Tylenol with tramadol with improvement of her pain.  She denies symptoms of fevers or chills, nausea or vomiting, shortness of breath, chest pain, dysuria, melena or hematochezia, constipation or diarrhea.  Her most recent colonoscopy was in 2014 with benign polyps and small hemorrhoids.  Patient states pain was unrelated to food or bowel movements.  She takes MiraLAX every other day for her bowel movements.  She is an every day smoker for 50 years but denies illicit drug use or alcohol use.  ROS: see HPI for pertinent.  [place]: DM with neuropathy, HLD, dysphagia, HTN, GERD, PAF (s/p ablation), tobacco use disorder.  Surgical history appendectomy, TAH, dental extraction.  Family history HTN, stroke, DM, stomach cancer (mother).  Smoking status reviewed. Medications reviewed.  Objective   BP 130/68   Pulse 86   Temp 98.8 F (37.1 C) (Oral)   Ht 5\' 6"  (1.676 m)   Wt 176 lb (79.8 kg)   SpO2 97%   BMI 28.41 kg/m  Vitals and nursing note reviewed.  General: well nourished, well developed, NAD with non-toxic appearance HEENT: normocephalic, atraumatic, moist mucous membranes Cardiovascular: regular rate and rhythm without murmurs, rubs, or gallops Lungs: clear to auscultation bilaterally with normal work of breathing Abdomen: soft, non-tender, non-distended, normoactive bowel sounds Skin: warm, dry, no rashes or lesions, cap refill < 2 seconds Extremities: warm and well perfused, normal tone, no edema  Assessment & Plan   Abdominal pain, left lower quadrant Acute.  Resolved.  Patient without  tenderness at present.  Differential includes constipation or bloating.  Without urinary symptoms making pyelonephritis less likely.  Absent melena or hematochezia making diverticulitis less likely.  UTD on colonoscopy as of 2014.  Tolerating regular diet stable weight.  Pain seems to have improved with Tylenol use making MSK-etiology more likely. - Reviewed return precautions and red flags - Instructed patient to see PCP and follow-up accordingly - Advised patient to take Tylenol  No orders of the defined types were placed in this encounter.  No orders of the defined types were placed in this encounter.   [name], [place], PGY-2 01/26/2017, 8:29 PM

## 2017-01-26 NOTE — Assessment & Plan Note (Addendum)
Acute.  Resolved.  Patient without tenderness at present.  Differential includes constipation or bloating.  Without urinary symptoms making pyelonephritis less likely.  Absent melena or hematochezia making diverticulitis less likely.  UTD on colonoscopy as of 2014.  Tolerating regular diet stable weight.  Pain seems to have improved with Tylenol use making MSK-etiology more likely. - Reviewed return precautions and red flags - Instructed patient to see PCP and follow-up accordingly - Advised patient to take Tylenol

## 2017-01-26 NOTE — Patient Instructions (Addendum)
Thank you for coming in to see [place] today. Please see below to review our plan for today's visit.  If you develop symptoms of nausea or vomiting, increase urination or burning when you urinate, constipation or diarrhea, or the pain continues to become more regular and severe, seek medical care.  You can always schedule appointment.  There is also an after hours line if you have questions about whether or not to go to the emergency room.  Please call the clinic at [phone] if your symptoms worsen or you have any concerns. It was our pleasure to serve you.  [name], [place], PGY-2

## 2017-02-01 ENCOUNTER — Encounter: Payer: Self-pay | Admitting: Internal Medicine

## 2017-02-04 ENCOUNTER — Other Ambulatory Visit: Payer: Self-pay | Admitting: Internal Medicine

## 2017-02-04 DIAGNOSIS — M62830 Muscle spasm of back: Secondary | ICD-10-CM

## 2017-02-15 ENCOUNTER — Other Ambulatory Visit: Payer: Self-pay | Admitting: Internal Medicine

## 2017-02-15 DIAGNOSIS — E1140 Type 2 diabetes mellitus with diabetic neuropathy, unspecified: Secondary | ICD-10-CM

## 2017-02-19 LAB — HM DIABETES EYE EXAM

## 2017-02-24 ENCOUNTER — Encounter: Payer: Self-pay | Admitting: Podiatry

## 2017-02-24 ENCOUNTER — Ambulatory Visit: Payer: Medicare Other | Admitting: Podiatry

## 2017-02-24 DIAGNOSIS — E1140 Type 2 diabetes mellitus with diabetic neuropathy, unspecified: Secondary | ICD-10-CM

## 2017-02-24 DIAGNOSIS — M2010 Hallux valgus (acquired), unspecified foot: Secondary | ICD-10-CM

## 2017-02-24 DIAGNOSIS — B351 Tinea unguium: Secondary | ICD-10-CM

## 2017-02-24 NOTE — Progress Notes (Signed)
Patient ID: [name], female   DOB: Mar 04, 1950, 67 y.o.   MRN: [id number] Complaint:  Visit Type: Patient returns to my office for continued preventative foot care services. Complaint: Patient states" my nails have grown long and thick and become painful to walk and wear shoes" Patient has been diagnosed with DM with neuropathy.. The patient presents for preventative foot care services. No changes to ROS. She also wishes to discuss another pair of diabetic shoes.   Podiatric Exam: Vascular: dorsalis pedis and posterior tibial pulses are palpable bilateral. Capillary return is immediate. Temperature gradient is WNL. Skin turgor WNL  Sensorium: Diminished  Semmes Weinstein monofilament test. Normal tactile sensation bilaterally. Nail Exam: Pt has thick disfigured discolored nails with subungual debris noted bilateral entire nail hallux through fifth toenails Ulcer Exam: There is no evidence of ulcer or pre-ulcerative changes or infection. Orthopedic Exam: Muscle tone and strength are WNL. No limitations in general ROM. No crepitus or effusions noted. Foot type and digits show no abnormalities. HAV right and left. Skin: No Porokeratosis. No infection or ulcers  Diagnosis:  Onychomycosis, , Pain in right toe, pain in left toe  Treatment & Plan Procedures and Treatment: Consent by patient was obtained for treatment procedures. The patient understood the discussion of treatment and procedures well. All questions were answered thoroughly reviewed. Debridement of mycotic and hypertrophic toenails, 1 through 5 bilateral and clearing of subungual debris. No ulceration, no infection noted.  Patient to make an appointment with Liliane Channel for diabetic shoes for 2019. Return Visit-Office Procedure: Patient instructed to return to the office for a follow up visit 10 weeks  for continued evaluation and treatment.        Gardiner Barefoot DPM

## 2017-03-02 ENCOUNTER — Other Ambulatory Visit: Payer: Self-pay

## 2017-03-02 ENCOUNTER — Ambulatory Visit: Payer: Medicare Other | Admitting: Orthotics

## 2017-03-02 ENCOUNTER — Encounter: Payer: Self-pay | Admitting: Internal Medicine

## 2017-03-02 ENCOUNTER — Ambulatory Visit (INDEPENDENT_AMBULATORY_CARE_PROVIDER_SITE_OTHER): Payer: Medicare Other | Admitting: Internal Medicine

## 2017-03-02 VITALS — BP 128/72 | HR 76 | Temp 98.5°F | Wt 177.6 lb

## 2017-03-02 DIAGNOSIS — G6289 Other specified polyneuropathies: Secondary | ICD-10-CM | POA: Diagnosis not present

## 2017-03-02 DIAGNOSIS — I10 Essential (primary) hypertension: Secondary | ICD-10-CM | POA: Diagnosis not present

## 2017-03-02 DIAGNOSIS — L309 Dermatitis, unspecified: Secondary | ICD-10-CM

## 2017-03-02 DIAGNOSIS — R42 Dizziness and giddiness: Secondary | ICD-10-CM

## 2017-03-02 DIAGNOSIS — E1142 Type 2 diabetes mellitus with diabetic polyneuropathy: Secondary | ICD-10-CM

## 2017-03-02 DIAGNOSIS — F411 Generalized anxiety disorder: Secondary | ICD-10-CM | POA: Diagnosis not present

## 2017-03-02 DIAGNOSIS — K219 Gastro-esophageal reflux disease without esophagitis: Secondary | ICD-10-CM | POA: Diagnosis not present

## 2017-03-02 DIAGNOSIS — M62830 Muscle spasm of back: Secondary | ICD-10-CM

## 2017-03-02 DIAGNOSIS — E1140 Type 2 diabetes mellitus with diabetic neuropathy, unspecified: Secondary | ICD-10-CM

## 2017-03-02 DIAGNOSIS — L851 Acquired keratosis [keratoderma] palmaris et plantaris: Secondary | ICD-10-CM

## 2017-03-02 DIAGNOSIS — M2010 Hallux valgus (acquired), unspecified foot: Secondary | ICD-10-CM

## 2017-03-02 MED ORDER — MECLIZINE HCL 25 MG PO TABS: 25.0000 mg | ORAL_TABLET | Freq: Two times a day (BID) | ORAL | 2 refills | Status: DC | PRN

## 2017-03-02 MED ORDER — LOSARTAN POTASSIUM 50 MG PO TABS: 50.0000 mg | ORAL_TABLET | Freq: Every day | ORAL | 2 refills | Status: DC

## 2017-03-02 MED ORDER — GABAPENTIN 300 MG PO CAPS: 600.0000 mg | ORAL_CAPSULE | Freq: Two times a day (BID) | ORAL | 2 refills | Status: DC | PRN

## 2017-03-02 MED ORDER — BACLOFEN 10 MG PO TABS: 10.0000 mg | ORAL_TABLET | Freq: Every day | ORAL | 1 refills | Status: DC | PRN

## 2017-03-02 MED ORDER — METFORMIN HCL 1000 MG PO TABS: 1000.0000 mg | ORAL_TABLET | Freq: Two times a day (BID) | ORAL | 2 refills | Status: DC

## 2017-03-02 MED ORDER — MOMETASONE FUROATE 50 MCG/ACT NA SUSP: 2.0000 | Freq: Every day | NASAL | 3 refills | Status: DC

## 2017-03-02 MED ORDER — ATENOLOL 25 MG PO TABS: ORAL_TABLET | ORAL | 2 refills | Status: DC

## 2017-03-02 MED ORDER — ONETOUCH ULTRA BLUE VI STRP: ORAL_STRIP | 12 refills | Status: DC

## 2017-03-02 MED ORDER — HYDROCHLOROTHIAZIDE 12.5 MG PO CAPS: 12.5000 mg | ORAL_CAPSULE | Freq: Every day | ORAL | 2 refills | Status: DC

## 2017-03-02 MED ORDER — TRAMADOL HCL 50 MG PO TABS: 50.0000 mg | ORAL_TABLET | Freq: Two times a day (BID) | ORAL | 2 refills | Status: DC | PRN

## 2017-03-02 MED ORDER — PANTOPRAZOLE SODIUM 40 MG PO TBEC: 40.0000 mg | DELAYED_RELEASE_TABLET | Freq: Every day | ORAL | 2 refills | Status: DC

## 2017-03-02 MED ORDER — ACETAMINOPHEN 325 MG PO TABS: 650.0000 mg | ORAL_TABLET | Freq: Four times a day (QID) | ORAL | 1 refills | Status: DC | PRN

## 2017-03-02 MED ORDER — TRIAMCINOLONE ACETONIDE 0.1 % EX CREA: 1.0000 "application " | TOPICAL_CREAM | Freq: Two times a day (BID) | CUTANEOUS | 1 refills | Status: DC

## 2017-03-02 MED ORDER — ONETOUCH ULTRASOFT LANCETS MISC: 5 refills | Status: DC

## 2017-03-02 MED ORDER — SUCRALFATE 1 GM/10ML PO SUSP: 1.0000 g | Freq: Four times a day (QID) | ORAL | 0 refills | Status: DC | PRN

## 2017-03-02 MED ORDER — ASPIRIN 81 MG PO CHEW: 81.0000 mg | CHEWABLE_TABLET | Freq: Every day | ORAL | 6 refills | Status: AC

## 2017-03-02 MED ORDER — GLIPIZIDE 10 MG PO TABS: ORAL_TABLET | ORAL | 5 refills | Status: DC

## 2017-03-02 MED ORDER — ATORVASTATIN CALCIUM 40 MG PO TABS: 40.0000 mg | ORAL_TABLET | Freq: Every day | ORAL | 2 refills | Status: DC

## 2017-03-02 MED ORDER — POLYETHYLENE GLYCOL 3350 17 G PO PACK: 17.0000 g | PACK | ORAL | 1 refills | Status: DC

## 2017-03-02 MED ORDER — LORAZEPAM 0.5 MG PO TABS: 0.5000 mg | ORAL_TABLET | Freq: Two times a day (BID) | ORAL | 2 refills | Status: DC | PRN

## 2017-03-02 NOTE — Progress Notes (Signed)
Patient seen for diabetic shoe measurement/selction of shoes.  Patient chose same shoe as last year apex [id number].Marland Kitchen PCP/DPM same

## 2017-03-02 NOTE — Patient Instructions (Signed)
Ms. Fogarty,  It was nice to see you today. I will call with your lab results.  Continue to try to cut back on sweets and incorporate regular exercise into your weekly routine.  I will be on the lookout for your diabetic shoe forms.  Please see me back in 3 months or sooner as needed.  Best, Dr. Ola Spurr

## 2017-03-02 NOTE — Progress Notes (Signed)
[facility] Progress Note  Subjective:  [name] is a 67 y.o. female with history of T2DM, HTN, anxiety, and tobacco abuse who presents for diabetes follow-up. Patient reports she thinks her CBGs have been running in the mid 100s for the most part. She says she has not been eating well due to the holidays but is pleased she has only gained 1 lb. She follows with podiatry and qualifies for another set of diabetic shoes and will be bringing this paperwork to me soon to help fill out. Taking metformin 1000 mg BID and glipizide 10 mg BID. She is not interested in making changes to her diabetes medications and would not consider injectables at this time (has met to discuss with Dr. Valentina Lucks before and declined trulicity). She is not regularly exercising. She follows with Ophthalmologist Dr. Delman Cheadle and is to see him later this month for check-up and monitoring of cataracts. Taking gabapentin 600-900 mg BID as needed for foot pains. ROS: No abdominal pain, no lightheadness  In addition, patient has been taking ativan 0.5 mg twice daily (compared to 1 mg BID previously) for anxiety; has not been able to decrease frequency but has done fine on reduced dose.   Allergies  Allergen Reactions  . Alcohol Swabs [Isopropyl Alcohol] Nausea Only    Use peroxide instead  . Penicillins Other (See Comments)    Arm swelling  . Prednisone Other (See Comments)    Could not sleep  . Amoxicillin-Pot Clavulanate Other (See Comments)    She is unsure what reaction she had    Social History   Tobacco Use  . Smoking status: Current Every Day Smoker    Packs/day: 1.00    Types: Cigarettes  . Smokeless tobacco: Never Used  Substance Use Topics  . Alcohol use: Yes    Alcohol/week: 7.0 oz    Types: 14 Standard drinks or equivalent per week    Comment: beer every 3-4 months     Objective: Blood pressure 128/72, pulse 76, temperature 98.5 F (36.9 C), temperature source Oral, weight 177 lb 9.6 oz  (80.6 kg), SpO2 95 %. Body mass index is 28.67 kg/m. Constitutional: Overweight female, in NAD HENT: MMM Cardiovascular: RRR, S1, S2, no m/r/g.  Pulmonary/Chest: Effort normal and breath sounds normal.  Musculoskeletal: No LE edema. Bilateral bunions.  Neurological: Proprioception intact on foot exam and normal monofilament testing.  Skin: Skin is warm and dry. Calluses over lateral plantar surfaces. Thickened toenails.  Psychiatric: Normal mood and affect.  Vitals reviewed  Assessment/Plan: Diabetes mellitus with neuropathy (HCC) - Ordered A1c (blood test because cartridges out).  - Will fill out diabetic shoe paperwork when provided.  - Encouraged patient to be more mindful with her eating. - To continue metformin 1000 mg BID, glipizide 10 mg BID.  - Will check CMP as already getting blood draw for A1c - Gabapentin 600-900 mg BID  Anxiety state - Stable. On ativan 0.5 mg BID.  - Encouraged patient to try to space out doses as able. - Not willing to consider SSRI at this time. Reduction of ativan dose was recent compromise.   Follow-up pending results of A1c.  [name], MD [place], PGY-3

## 2017-03-03 LAB — COMPREHENSIVE METABOLIC PANEL
A/G RATIO: 2 (ref 1.2–2.2)
ALT: 14 IU/L (ref 0–32)
AST: 16 IU/L (ref 0–40)
Albumin: 4.3 g/dL (ref 3.6–4.8)
Alkaline Phosphatase: 88 IU/L (ref 39–117)
BUN/Creatinine Ratio: 14 (ref 12–28)
BUN: 8 mg/dL (ref 8–27)
Bilirubin Total: 0.6 mg/dL (ref 0.0–1.2)
CALCIUM: 9.8 mg/dL (ref 8.7–10.3)
CO2: 24 mmol/L (ref 20–29)
CREATININE: 0.57 mg/dL (ref 0.57–1.00)
Chloride: 100 mmol/L (ref 96–106)
GFR calc Af Amer: 113 mL/min/{1.73_m2} (ref >59)
GFR, EST NON AFRICAN AMERICAN: 98 mL/min/{1.73_m2} (ref >59)
Globulin, Total: 2.1 g/dL (ref 1.5–4.5)
Glucose: 167 mg/dL — ABNORMAL HIGH (ref 65–99)
POTASSIUM: 3.6 mmol/L (ref 3.5–5.2)
Sodium: 142 mmol/L (ref 134–144)
TOTAL PROTEIN: 6.4 g/dL (ref 6.0–8.5)

## 2017-03-03 LAB — HEMOGLOBIN A1C
Est. average glucose Bld gHb Est-mCnc: 209 mg/dL
Hgb A1c MFr Bld: 8.9 % — ABNORMAL HIGH (ref 4.8–5.6)

## 2017-03-06 ENCOUNTER — Encounter: Payer: Self-pay | Admitting: Internal Medicine

## 2017-03-06 NOTE — Assessment & Plan Note (Addendum)
-   Ordered A1c (blood test because cartridges out).  - Will fill out diabetic shoe paperwork when provided.  - Encouraged patient to be more mindful with her eating. - To continue metformin 1000 mg BID, glipizide 10 mg BID.  - Will check CMP as already getting blood draw for A1c - Gabapentin 600-900 mg BID

## 2017-03-06 NOTE — Assessment & Plan Note (Signed)
-   Stable. On ativan 0.5 mg BID.  - Encouraged patient to try to space out doses as able. - Not willing to consider SSRI at this time. Reduction of ativan dose was recent compromise.

## 2017-03-19 ENCOUNTER — Encounter: Payer: Self-pay | Admitting: Internal Medicine

## 2017-04-05 ENCOUNTER — Ambulatory Visit (INDEPENDENT_AMBULATORY_CARE_PROVIDER_SITE_OTHER): Payer: Medicare Other | Admitting: Orthotics

## 2017-04-05 DIAGNOSIS — L851 Acquired keratosis [keratoderma] palmaris et plantaris: Secondary | ICD-10-CM

## 2017-04-05 DIAGNOSIS — E1140 Type 2 diabetes mellitus with diabetic neuropathy, unspecified: Secondary | ICD-10-CM

## 2017-04-05 DIAGNOSIS — M2010 Hallux valgus (acquired), unspecified foot: Secondary | ICD-10-CM

## 2017-04-05 NOTE — Progress Notes (Signed)

## 2017-04-07 ENCOUNTER — Telehealth: Payer: Self-pay | Admitting: Podiatry

## 2017-04-07 NOTE — Telephone Encounter (Signed)
Called pt regarding her diabetic shoes from last year that were coming apart at the seam.  I spoke to [employer] and he is replacing them but they are on back order until the end of July and I made pt aware of this. Pt asked if we sent the older shoes back and I told her no we had not and she will come by the office today to pick up the old ones.

## 2017-05-03 ENCOUNTER — Other Ambulatory Visit: Payer: Self-pay | Admitting: *Deleted

## 2017-05-03 ENCOUNTER — Other Ambulatory Visit: Payer: Self-pay

## 2017-05-03 DIAGNOSIS — E1140 Type 2 diabetes mellitus with diabetic neuropathy, unspecified: Secondary | ICD-10-CM

## 2017-05-03 MED ORDER — ONETOUCH ULTRA BLUE VI STRP: ORAL_STRIP | 3 refills | Status: DC

## 2017-05-03 MED ORDER — ONETOUCH ULTRA BLUE VI STRP: ORAL_STRIP | 12 refills | Status: DC

## 2017-05-04 ENCOUNTER — Other Ambulatory Visit: Payer: Self-pay | Admitting: Internal Medicine

## 2017-05-04 DIAGNOSIS — E1140 Type 2 diabetes mellitus with diabetic neuropathy, unspecified: Secondary | ICD-10-CM

## 2017-05-04 MED ORDER — ONETOUCH ULTRA BLUE VI STRP: ORAL_STRIP | 3 refills | Status: DC

## 2017-05-12 DIAGNOSIS — H1045 Other chronic allergic conjunctivitis: Secondary | ICD-10-CM | POA: Diagnosis not present

## 2017-05-12 DIAGNOSIS — J019 Acute sinusitis, unspecified: Secondary | ICD-10-CM | POA: Diagnosis not present

## 2017-05-12 DIAGNOSIS — J3089 Other allergic rhinitis: Secondary | ICD-10-CM | POA: Diagnosis not present

## 2017-05-12 DIAGNOSIS — J4520 Mild intermittent asthma, uncomplicated: Secondary | ICD-10-CM | POA: Diagnosis not present

## 2017-05-19 ENCOUNTER — Encounter: Payer: Self-pay | Admitting: Internal Medicine

## 2017-05-27 ENCOUNTER — Encounter: Payer: Self-pay | Admitting: Internal Medicine

## 2017-05-27 ENCOUNTER — Ambulatory Visit (INDEPENDENT_AMBULATORY_CARE_PROVIDER_SITE_OTHER): Payer: Medicare Other | Admitting: Internal Medicine

## 2017-05-27 ENCOUNTER — Other Ambulatory Visit: Payer: Self-pay

## 2017-05-27 VITALS — BP 126/68 | HR 82 | Temp 98.8°F | Ht 66.0 in | Wt 167.4 lb

## 2017-05-27 DIAGNOSIS — E1140 Type 2 diabetes mellitus with diabetic neuropathy, unspecified: Secondary | ICD-10-CM | POA: Diagnosis not present

## 2017-05-27 DIAGNOSIS — R5383 Other fatigue: Secondary | ICD-10-CM | POA: Diagnosis not present

## 2017-05-27 DIAGNOSIS — Z5181 Encounter for therapeutic drug level monitoring: Secondary | ICD-10-CM | POA: Diagnosis not present

## 2017-05-27 DIAGNOSIS — F411 Generalized anxiety disorder: Secondary | ICD-10-CM

## 2017-05-27 DIAGNOSIS — H00011 Hordeolum externum right upper eyelid: Secondary | ICD-10-CM

## 2017-05-27 DIAGNOSIS — G6289 Other specified polyneuropathies: Secondary | ICD-10-CM

## 2017-05-27 DIAGNOSIS — R252 Cramp and spasm: Secondary | ICD-10-CM

## 2017-05-27 DIAGNOSIS — M62830 Muscle spasm of back: Secondary | ICD-10-CM | POA: Diagnosis not present

## 2017-05-27 DIAGNOSIS — K219 Gastro-esophageal reflux disease without esophagitis: Secondary | ICD-10-CM | POA: Diagnosis not present

## 2017-05-27 DIAGNOSIS — E0840 Diabetes mellitus due to underlying condition with diabetic neuropathy, unspecified: Secondary | ICD-10-CM | POA: Diagnosis not present

## 2017-05-27 LAB — POCT GLYCOSYLATED HEMOGLOBIN (HGB A1C): Hemoglobin A1C: 8.4

## 2017-05-27 MED ORDER — BACLOFEN 10 MG PO TABS
10.0000 mg | ORAL_TABLET | Freq: Every day | ORAL | 1 refills | Status: DC | PRN
Start: 2017-05-27 — End: 2017-08-31

## 2017-05-27 MED ORDER — SUCRALFATE 1 GM/10ML PO SUSP
1.0000 g | Freq: Four times a day (QID) | ORAL | 0 refills | Status: DC | PRN
Start: 2017-05-27 — End: 2017-05-28

## 2017-05-27 MED ORDER — GABAPENTIN 300 MG PO CAPS: 900.0000 mg | ORAL_CAPSULE | Freq: Two times a day (BID) | ORAL | 2 refills | Status: DC | PRN

## 2017-05-27 MED ORDER — TRAMADOL HCL 50 MG PO TABS: 50.0000 mg | ORAL_TABLET | Freq: Two times a day (BID) | ORAL | 0 refills | Status: DC | PRN

## 2017-05-27 MED ORDER — LORAZEPAM 0.5 MG PO TABS: 0.5000 mg | ORAL_TABLET | Freq: Two times a day (BID) | ORAL | 0 refills | Status: DC | PRN

## 2017-05-27 MED ORDER — GLIPIZIDE 10 MG PO TABS: ORAL_TABLET | ORAL | 2 refills | Status: DC

## 2017-05-27 NOTE — Patient Instructions (Signed)
Ms. Manninen,  Your A1c is improved from last time we checked.  Make sure to eat at least 3 regular meals a day. Try to make time for stress-relieving activities like reading, meditating. Increasing how many hours you sleep a night can also help reduce stress.  I will call with lab results regarding leg cramps. It could be the result of worsening diabetic neuropathy. Increase gabapentin to 900 mg three times daily scheduled.  Return in 3 months for diabetes check.  Best, Dr. Ola Spurr

## 2017-05-27 NOTE — Progress Notes (Signed)
[facility] Progress Note  Subjective:  CERENITY GOSHORN is a 67 y.o. female with history of T2DM, anxiety, tobacco abuse, and peripheral neuropathy who presents for follow-up diabetes and muscle cramps.   #T2DM: - Continues to take metformin 1000 mg BID and glipizide 10 mg BID - has been watching sweet intake - has noted some weight loss and says she has been eating less--thinks partially due to stress - checks her sugars regularly because it makes her less anxious and rarely sees values in 200s - follows with podiatry and has diabetic shoes - saw Ophthalmologist Dr. Delman Cheadle 02/19/17 and did not have diabetic neuropathy  ROS: no abdominal pain, no n/v/d  #Muscle cramps: - Worse over the last 2 weeks - Tried magnesium supplementation with only temporary improvement - Is on statin therapy - takes tramadol prn and baclofen prn - takes gabapentin 600-900 mg a couple to a few times a day - sometimes involve her calves, sometimes more anterior over her shins, usually last only a few minutes - Her podiatrist recommended nutriwest penta-cal-plus (4 a day)  Other: Has been recently treated for sinus infection with bactrim and clarithromycin.   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 Says could not tolerate lung cancer screening with CT due to anxiety in scanner.   Allergies  Allergen Reactions  . Alcohol Swabs [Isopropyl Alcohol] Nausea Only    Use peroxide instead  . Penicillins Other (See Comments)    Arm swelling  . Prednisone Other (See Comments)    Could not sleep  . Amoxicillin-Pot Clavulanate Other (See Comments)    She is unsure what reaction she had    Social History   Tobacco Use  . Smoking status: Current Every Day Smoker    Packs/day: 1.00    Types: Cigarettes   . Smokeless tobacco: Never Used  Substance Use Topics  . Alcohol use: Yes    Alcohol/week: 7.0 oz    Types: 14 Standard drinks or equivalent per week    Comment: beer every 3-4 months     Objective: Blood pressure 126/68, pulse 82, temperature 98.8 F (37.1 C), temperature source Oral, height 5\' 6"  (1.676 m), weight 167 lb 6.4 oz (75.9 kg), SpO2 98 %. Body mass index is 27.02 kg/m. Constitutional: Well-appearing female in NAD Cardiovascular: RRR, S1, S2, no m/r/g.  Pulmonary/Chest: Effort normal and breath sounds normal.  Musculoskeletal: No LE edema. Cannot elicit TTP. Negative Homan's sign.  Neurological: AOx3, no focal deficits. Skin: Skin is warm and dry. No rash noted.  Psychiatric: Somewhat antagonistic rapport with staff Vitals reviewed  A1c 3 months ago: 8.9 A1c 6 months ago: 7.8  Assessment/Plan: Diabetes mellitus with neuropathy (HCC) - A1c improved from prior at 8.4 but above goal of <8.0. Patient not amenable to GLP-1 receptor agonist or insulin at this time.  - Encouraged patient to eat regular meals and continue to watch carbohydrate intake - Counseled to try to reduce stress with nighttime wind-down routine and to get 7-8 hours of sleep overnight - Would counsel about glp-1 agonist again if a1c above goal at next visit  Leg cramps - May be worsening neuropathy. Recommended taking gabapentin 900 mg TID rather than prn.  - Will check CMP, B12, TSH, CBC and lipid panel (as has been over a year since checking  cholesterol)  Follow-up in 3 months. Provided refills of tramadol and ativan through that time.   [name], MD [place] [facility], PGY-3

## 2017-05-28 ENCOUNTER — Encounter: Payer: Self-pay | Admitting: Internal Medicine

## 2017-05-28 ENCOUNTER — Other Ambulatory Visit: Payer: Self-pay | Admitting: Internal Medicine

## 2017-05-28 DIAGNOSIS — K219 Gastro-esophageal reflux disease without esophagitis: Secondary | ICD-10-CM

## 2017-05-28 LAB — COMPREHENSIVE METABOLIC PANEL
A/G RATIO: 1.7 (ref 1.2–2.2)
ALK PHOS: 98 IU/L (ref 39–117)
ALT: 25 IU/L (ref 0–32)
AST: 16 IU/L (ref 0–40)
Albumin: 4.5 g/dL (ref 3.6–4.8)
BILIRUBIN TOTAL: 0.3 mg/dL (ref 0.0–1.2)
BUN/Creatinine Ratio: 21 (ref 12–28)
BUN: 13 mg/dL (ref 8–27)
CHLORIDE: 97 mmol/L (ref 96–106)
CO2: 24 mmol/L (ref 20–29)
Calcium: 10.1 mg/dL (ref 8.7–10.3)
Creatinine, Ser: 0.63 mg/dL (ref 0.57–1.00)
GFR calc non Af Amer: 95 mL/min/{1.73_m2} (ref >59)
GFR, EST AFRICAN AMERICAN: 110 mL/min/{1.73_m2} (ref >59)
GLUCOSE: 246 mg/dL — AB (ref 65–99)
Globulin, Total: 2.6 g/dL (ref 1.5–4.5)
POTASSIUM: 4.2 mmol/L (ref 3.5–5.2)
Sodium: 137 mmol/L (ref 134–144)
Total Protein: 7.1 g/dL (ref 6.0–8.5)

## 2017-05-28 LAB — CBC
HEMATOCRIT: 44.1 % (ref 34.0–46.6)
HEMOGLOBIN: 14.5 g/dL (ref 11.1–15.9)
MCH: 31.5 pg (ref 26.6–33.0)
MCHC: 32.9 g/dL (ref 31.5–35.7)
MCV: 96 fL (ref 79–97)
Platelets: 256 10*3/uL (ref 150–379)
RBC: 4.61 x10E6/uL (ref 3.77–5.28)
RDW: 13.7 % (ref 12.3–15.4)
WBC: 8.7 10*3/uL (ref 3.4–10.8)

## 2017-05-28 LAB — LIPID PANEL
CHOL/HDL RATIO: 3.2 ratio (ref 0.0–4.4)
Cholesterol, Total: 112 mg/dL (ref 100–199)
HDL: 35 mg/dL — AB (ref >39)
LDL Calculated: 48 mg/dL (ref 0–99)
TRIGLYCERIDES: 143 mg/dL (ref 0–149)
VLDL Cholesterol Cal: 29 mg/dL (ref 5–40)

## 2017-05-28 LAB — VITAMIN B12: Vitamin B-12: 628 pg/mL (ref 232–1245)

## 2017-05-28 LAB — TSH: TSH: 0.842 u[IU]/mL (ref 0.450–4.500)

## 2017-05-28 MED ORDER — SUCRALFATE 1 GM/10ML PO SUSP
1.0000 g | Freq: Four times a day (QID) | ORAL | 0 refills | Status: DC | PRN
Start: 2017-05-28 — End: 2017-07-21

## 2017-05-30 ENCOUNTER — Encounter: Payer: Self-pay | Admitting: Internal Medicine

## 2017-05-30 NOTE — Assessment & Plan Note (Addendum)
-   May be worsening neuropathy. Recommended taking gabapentin 900 mg TID rather than prn.  - Will check CMP, B12, TSH, CBC and lipid panel (as has been over a year since checking cholesterol)

## 2017-05-30 NOTE — Assessment & Plan Note (Signed)
-   A1c improved from prior at 8.4 but above goal of <8.0. Patient not amenable to GLP-1 receptor agonist or insulin at this time.  - Encouraged patient to eat regular meals and continue to watch carbohydrate intake - Counseled to try to reduce stress with nighttime wind-down routine and to get 7-8 hours of sleep overnight - Would counsel about glp-1 agonist again if a1c above goal at next visit

## 2017-05-31 ENCOUNTER — Encounter: Payer: Self-pay | Admitting: Internal Medicine

## 2017-06-01 DIAGNOSIS — J3089 Other allergic rhinitis: Secondary | ICD-10-CM | POA: Diagnosis not present

## 2017-06-01 DIAGNOSIS — J301 Allergic rhinitis due to pollen: Secondary | ICD-10-CM | POA: Diagnosis not present

## 2017-06-03 DIAGNOSIS — J3089 Other allergic rhinitis: Secondary | ICD-10-CM | POA: Diagnosis not present

## 2017-06-03 DIAGNOSIS — J301 Allergic rhinitis due to pollen: Secondary | ICD-10-CM | POA: Diagnosis not present

## 2017-06-09 DIAGNOSIS — J301 Allergic rhinitis due to pollen: Secondary | ICD-10-CM | POA: Diagnosis not present

## 2017-06-09 DIAGNOSIS — J3089 Other allergic rhinitis: Secondary | ICD-10-CM | POA: Diagnosis not present

## 2017-06-14 DIAGNOSIS — E119 Type 2 diabetes mellitus without complications: Secondary | ICD-10-CM | POA: Diagnosis not present

## 2017-06-14 DIAGNOSIS — H2513 Age-related nuclear cataract, bilateral: Secondary | ICD-10-CM | POA: Diagnosis not present

## 2017-06-18 DIAGNOSIS — J3089 Other allergic rhinitis: Secondary | ICD-10-CM | POA: Diagnosis not present

## 2017-06-18 DIAGNOSIS — J301 Allergic rhinitis due to pollen: Secondary | ICD-10-CM | POA: Diagnosis not present

## 2017-06-25 ENCOUNTER — Ambulatory Visit: Payer: Medicare Other | Admitting: Podiatry

## 2017-06-25 DIAGNOSIS — J3089 Other allergic rhinitis: Secondary | ICD-10-CM | POA: Diagnosis not present

## 2017-06-25 DIAGNOSIS — J301 Allergic rhinitis due to pollen: Secondary | ICD-10-CM | POA: Diagnosis not present

## 2017-06-30 DIAGNOSIS — H524 Presbyopia: Secondary | ICD-10-CM | POA: Diagnosis not present

## 2017-06-30 DIAGNOSIS — H2513 Age-related nuclear cataract, bilateral: Secondary | ICD-10-CM | POA: Diagnosis not present

## 2017-06-30 DIAGNOSIS — H25043 Posterior subcapsular polar age-related cataract, bilateral: Secondary | ICD-10-CM | POA: Diagnosis not present

## 2017-06-30 DIAGNOSIS — H25013 Cortical age-related cataract, bilateral: Secondary | ICD-10-CM | POA: Diagnosis not present

## 2017-07-02 ENCOUNTER — Ambulatory Visit: Payer: Medicare Other | Admitting: Podiatry

## 2017-07-02 DIAGNOSIS — J301 Allergic rhinitis due to pollen: Secondary | ICD-10-CM | POA: Diagnosis not present

## 2017-07-02 DIAGNOSIS — J3089 Other allergic rhinitis: Secondary | ICD-10-CM | POA: Diagnosis not present

## 2017-07-05 ENCOUNTER — Emergency Department (HOSPITAL_COMMUNITY): Payer: Medicare Other

## 2017-07-05 ENCOUNTER — Other Ambulatory Visit: Payer: Self-pay

## 2017-07-05 ENCOUNTER — Emergency Department (HOSPITAL_COMMUNITY)
Admission: EM | Admit: 2017-07-05 | Discharge: 2017-07-05 | Disposition: A | Discharge status: Home / Self Care | Payer: Medicare Other | Attending: Emergency Medicine | Admitting: Emergency Medicine

## 2017-07-05 ENCOUNTER — Encounter (HOSPITAL_COMMUNITY): Payer: Self-pay | Admitting: Emergency Medicine

## 2017-07-05 DIAGNOSIS — F17210 Nicotine dependence, cigarettes, uncomplicated: Secondary | ICD-10-CM | POA: Diagnosis not present

## 2017-07-05 DIAGNOSIS — I509 Heart failure, unspecified: Secondary | ICD-10-CM | POA: Diagnosis not present

## 2017-07-05 DIAGNOSIS — J45909 Unspecified asthma, uncomplicated: Secondary | ICD-10-CM | POA: Diagnosis not present

## 2017-07-05 DIAGNOSIS — Z7984 Long term (current) use of oral hypoglycemic drugs: Secondary | ICD-10-CM | POA: Insufficient documentation

## 2017-07-05 DIAGNOSIS — Z79899 Other long term (current) drug therapy: Secondary | ICD-10-CM | POA: Insufficient documentation

## 2017-07-05 DIAGNOSIS — R079 Chest pain, unspecified: Secondary | ICD-10-CM | POA: Diagnosis not present

## 2017-07-05 DIAGNOSIS — I110 Hypertensive heart disease with heart failure: Secondary | ICD-10-CM | POA: Diagnosis not present

## 2017-07-05 DIAGNOSIS — E119 Type 2 diabetes mellitus without complications: Secondary | ICD-10-CM | POA: Diagnosis not present

## 2017-07-05 DIAGNOSIS — Z7982 Long term (current) use of aspirin: Secondary | ICD-10-CM | POA: Diagnosis not present

## 2017-07-05 LAB — CBC
HCT: 41.1 % (ref 36.0–46.0)
Hemoglobin: 13.8 g/dL (ref 12.0–15.0)
MCH: 31.7 pg (ref 26.0–34.0)
MCHC: 33.6 g/dL (ref 30.0–36.0)
MCV: 94.5 fL (ref 78.0–100.0)
Platelets: 227 10*3/uL (ref 150–400)
RBC: 4.35 MIL/uL (ref 3.87–5.11)
RDW: 13.8 % (ref 11.5–15.5)
WBC: 8.7 10*3/uL (ref 4.0–10.5)

## 2017-07-05 LAB — BASIC METABOLIC PANEL
Anion gap: 10 (ref 5–15)
BUN: 8 mg/dL (ref 6–20)
CO2: 27 mmol/L (ref 22–32)
Calcium: 9.3 mg/dL (ref 8.9–10.3)
Chloride: 105 mmol/L (ref 101–111)
Creatinine, Ser: 0.57 mg/dL (ref 0.44–1.00)
GFR calc Af Amer: >60 mL/min (ref >60)
GFR calc non Af Amer: >60 mL/min (ref >60)
Glucose, Bld: 191 mg/dL — ABNORMAL HIGH (ref 65–99)
Potassium: 3.7 mmol/L (ref 3.5–5.1)
Sodium: 142 mmol/L (ref 135–145)

## 2017-07-05 LAB — I-STAT TROPONIN, ED
Troponin i, poc: 0 ng/mL (ref 0.00–0.08)
Troponin i, poc: 0 ng/mL (ref 0.00–0.08)

## 2017-07-05 NOTE — Discharge Instructions (Addendum)
You were seen in the emergency department today for chest pain after stretching.  The EKG and enzyme were used to check your heart did not show signs of an acute heart attack.  Your chest x-ray was normal.  Your lab work was all reassuring.  Your glucose (sugar) was high at 191.   At this time we suspect that your pain could be possible due to spasm or strain of a muscle in your chest wall.  Apply heat to this area.  Take Tylenol per over-the-counter dosing as needed for pain.  Follow-up with your primary care provider within 3 days for reevaluation, return to the ER for new or worsening symptoms including but not limited to persistent pain, trouble breathing, vomiting, fever, passing out, or any other concerns.

## 2017-07-05 NOTE — ED Notes (Signed)
Pt ambulated to restroom with steady gait.

## 2017-07-05 NOTE — ED Triage Notes (Signed)
Pt reports sharp substernal CP since waking approx 9am this morning. Pain radiates to her back. Denies SOB or weakness, diaphoresis, nausea, vomiting with the pain. VSS.

## 2017-07-05 NOTE — ED Provider Notes (Signed)
[facility] EMERGENCY DEPARTMENT Provider Note   CSN: [id number] Arrival date & time: 07/05/17  1008     History   Chief Complaint Chief Complaint  Patient presents with  . Chest Pain    HPI [name] is a 67 y.o. female with a hx of tobacco abuse, anxiety, asthma, CHF, T2DM, dyslipidemia, GERD, HTN, and SVT who presents to the ED with complaints of intermittent chest pain since 0900 this AM. Patient states she woke up asymptomatic, she stood up to stretch with her arms over her head and somewhat to the R and developed a pain to the L lower chest that wrapped around to her back, it did not radiate straight through. She states pain was sharp and lasted 3-5 seconds prior to resolving. Pain only reoccurs with certain position changes/movements, no pain otherwise. Patient is currently pain free. When the pain occurs she rates it a 10/10 in severity, it is not worse with exertion or a deep breath. Denies associated sxs. No intervention prior to arrival. Denies fever, chills, nausea, vomiting, cough, diaphoresis, or dyspnea.   HPI  Past Medical History:  Diagnosis Date  . Allergy   . Anxiety   . AR (allergic rhinitis)   . Asthma   . Cataract   . CHF (congestive heart failure) ([place])   . Colon polyps    hyperplastic  . Disturbance of skin sensation    facial paresthesia; left  . DM2 (diabetes mellitus, type 2) ([place])   . Dyslipidemia   . GERD (gastroesophageal reflux disease)   . Hemorrhoids   . Hyperlipidemia   . Hypertension    essential, benign   . Insomnia   . Menopausal syndrome   . Palpitations    hx  . Routine general medical examination at a health care facility   . Screening for malignant neoplasm of the cervix   . Skin lesion   . SVT (supraventricular tachycardia) ([place])   . Tobacco abuse   . Weakness     Patient Active Problem List   Diagnosis Date Noted  . Abdominal pain, left lower quadrant 01/26/2017  . Shoulder mass 08/22/2016  .  Stye 05/21/2016  . Leg cramps 03/22/2016  . Dizziness 03/22/2016  . Hip pain 11/28/2015  . Eczema 05/07/2015  . Fatty infiltration of liver 05/22/2014  . Chronic low back pain 05/21/2014  . S/P ablation of atrial flutter 01/09/2014  . Peripheral neuropathy 12/01/2013  . Dysphagia 02/24/2013  . Extrinsic asthma 09/29/2012  . Onychomycosis 06/10/2012  . Overweight 08/16/2008  . TOBACCO ABUSE 11/03/2007  . Essential hypertension, benign 10/18/2007  . Anxiety state 10/03/2007  . Diabetes mellitus with neuropathy ([place]) 08/18/2007  . DYSLIPIDEMIA 02/17/2007  . Allergic rhinitis 07/21/2006  . GERD 07/21/2006    Past Surgical History:  Procedure Laterality Date  . ABDOMINAL HYSTERECTOMY  1990   For fibroids benign.  Still has ovaries  . ablation for SVT  9/09   Dr. Lovena Le  . APPENDECTOMY    . COLONOSCOPY  2009  . dental extractions    . SIGMOIDOSCOPY  2004     OB History   None      Home Medications    Prior to Admission medications   Medication Sig Start Date End Date Taking? Authorizing Provider  acetaminophen (TYLENOL) 325 MG tablet Take 2 tablets (650 mg total) by mouth every 6 (six) hours as needed for moderate pain. 03/02/17   Rogue Bussing, MD  aspirin 81 MG chewable tablet  Chew 1 tablet (81 mg total) by mouth daily. 03/02/17   Rogue Bussing, MD  atenolol (TENORMIN) 25 MG tablet TAKE 1 TABLET(S) BY MOUTH DAILY 03/02/17   Rogue Bussing, MD  atorvastatin (LIPITOR) 40 MG tablet Take 1 tablet (40 mg total) by mouth daily. 03/02/17   Rogue Bussing, MD  baclofen (LIORESAL) 10 MG tablet Take 1 tablet (10 mg total) by mouth daily as needed for muscle spasms. 05/27/17   Rogue Bussing, MD  gabapentin (NEURONTIN) 300 MG capsule Take 3 capsules (900 mg total) by mouth 2 (two) times daily as needed. 05/27/17   Rogue Bussing, MD  glipiZIDE (GLUCOTROL) 10 MG tablet TAKE ONE TABLET BY MOUTH TWICE A DAY BEFORE A MEAL 02/15/17    Rogue Bussing, MD  glipiZIDE (GLUCOTROL) 10 MG tablet TAKE ONE TABLET BY MOUTH TWICE A DAY BEFORE A MEAL 05/27/17   Rogue Bussing, MD  hydrochlorothiazide (MICROZIDE) 12.5 MG capsule Take 1 capsule (12.5 mg total) by mouth daily. 03/02/17   Rogue Bussing, MD  Lancets [facility] ULTRASOFT) lancets CHECK BLOOD SUGAR 3 TIMES DAILY. 03/02/17   Rogue Bussing, MD  LORazepam (ATIVAN) 0.5 MG tablet Take 1 tablet (0.5 mg total) by mouth 2 (two) times daily as needed for anxiety. 05/27/17   Rogue Bussing, MD  losartan (COZAAR) 50 MG tablet Take 1 tablet (50 mg total) by mouth daily. 03/02/17   Rogue Bussing, MD  meclizine (ANTIVERT) 25 MG tablet Take 1-2 tablets (25-50 mg total) by mouth 2 (two) times daily as needed for dizziness. if having symptoms.  May repeat in 12 hours later. 03/02/17   Rogue Bussing, MD  metFORMIN (GLUCOPHAGE) 1000 MG tablet Take 1 tablet (1,000 mg total) by mouth 2 (two) times daily with a meal. 03/02/17   Fitzgerald, Sharman Cheek, MD  mometasone (NASONEX) 50 MCG/ACT nasal spray Place 2 sprays into the nose daily. 03/02/17   Rogue Bussing, MD  ONE [facility] ULTRA TEST test strip Check blood sugar daily as needed. 05/04/17   Rogue Bussing, MD  pantoprazole (PROTONIX) 40 MG tablet Take 1 tablet (40 mg total) by mouth daily. 03/02/17   Rogue Bussing, MD  polyethylene glycol [facility] / Floria Raveling) packet Take 17 g by mouth every other day. 03/02/17   Rogue Bussing, MD  sucralfate (CARAFATE) 1 GM/10ML suspension Take 10 mLs (1 g total) by mouth 4 (four) times daily as needed (reflux). 05/28/17   Rogue Bussing, MD  traMADol (ULTRAM) 50 MG tablet Take 1 tablet (50 mg total) by mouth every 12 (twelve) hours as needed for moderate pain or severe pain. 05/27/17   Rogue Bussing, MD  triamcinolone cream (KENALOG) 0.1 % Apply 1 application topically 2 (two) times daily. For up to  2 weeks then stop. 03/02/17   Rogue Bussing, MD    Family History Family History  Problem Relation Age of Onset  . Heart attack Father 58  . Hypertension Mother   . Stroke Mother   . Diabetes Mother   . Stomach cancer Mother   . Cancer Mother   . Coronary artery disease Unknown        female, 1st degree relative <50  . Stomach cancer Maternal Aunt     Social History Social History   Tobacco Use  . Smoking status: Current Every Day Smoker    Packs/day: 1.00    Types: Cigarettes  . Smokeless tobacco: Never Used  Substance  Use Topics  . Alcohol use: Yes    Alcohol/week: 8.4 oz    Types: 14 Standard drinks or equivalent per week    Comment: beer every 3-4 months   . Drug use: No     Allergies   Alcohol swabs [isopropyl alcohol]; Penicillins; Prednisone; and Amoxicillin-pot clavulanate   Review of Systems Review of Systems  Constitutional: Negative for chills and fever.  Respiratory: Negative for shortness of breath.   Cardiovascular: Positive for chest pain (resolved at present). Negative for palpitations and leg swelling.  Gastrointestinal: Negative for abdominal pain, constipation, diarrhea, nausea and vomiting.  Neurological: Negative for syncope, weakness and numbness.  All other systems reviewed and are negative.   Physical Exam Updated Vital Signs BP 130/72   Pulse 64   Temp 98.7 F (37.1 C) (Oral)   Resp 19   Ht 5\' 4"  (1.626 m)   Wt 75.8 kg (167 lb)   SpO2 96%   BMI 28.67 kg/m   Physical Exam  Constitutional: She appears well-developed and well-nourished. No distress.  HENT:  Head: Normocephalic and atraumatic.  Eyes: Conjunctivae are normal. Right eye exhibits no discharge. Left eye exhibits no discharge.  Cardiovascular: Normal rate and regular rhythm.  No murmur heard. Pulses:      Radial pulses are 2+ on the right side, and 2+ on the left side.  Pulmonary/Chest: Effort normal and breath sounds normal. No respiratory distress. She  has no decreased breath sounds. She has no wheezes. She has no rhonchi. She has no rales. She exhibits tenderness (left lower anterior chest wall). She exhibits no crepitus, no edema, no deformity, no swelling and no retraction.  Abdominal: Soft. She exhibits no distension. There is no tenderness.  Musculoskeletal:       Right lower leg: She exhibits no tenderness and no edema.       Left lower leg: She exhibits no tenderness and no edema.  Neurological: She is alert.  Clear speech.   Skin: Skin is warm and dry. No rash noted.  Psychiatric: She has a normal mood and affect. Her behavior is normal.  Nursing note and vitals reviewed.    ED Treatments / Results  Labs Results for orders placed or performed during the hospital encounter of 93/71/69  Basic metabolic panel  Result Value Ref Range   Sodium 142 135 - 145 mmol/L   Potassium 3.7 3.5 - 5.1 mmol/L   Chloride 105 101 - 111 mmol/L   CO2 27 22 - 32 mmol/L   Glucose, Bld 191 (H) 65 - 99 mg/dL   BUN 8 6 - 20 mg/dL   Creatinine, Ser 0.57 0.44 - 1.00 mg/dL   Calcium 9.3 8.9 - 10.3 mg/dL   GFR calc non Af Amer >60 >60 mL/min   GFR calc Af Amer >60 >60 mL/min   Anion gap 10 5 - 15  CBC  Result Value Ref Range   WBC 8.7 4.0 - 10.5 K/uL   RBC 4.35 3.87 - 5.11 MIL/uL   Hemoglobin 13.8 12.0 - 15.0 g/dL   HCT 41.1 36.0 - 46.0 %   MCV 94.5 78.0 - 100.0 fL   MCH 31.7 26.0 - 34.0 pg   MCHC 33.6 30.0 - 36.0 g/dL   RDW 13.8 11.5 - 15.5 %   Platelets 227 150 - 400 K/uL  I-stat troponin, ED  Result Value Ref Range   Troponin i, poc 0.00 0.00 - 0.08 ng/mL   Comment 3  I-stat troponin, ED  Result Value Ref Range   Troponin i, poc 0.00 0.00 - 0.08 ng/mL   Comment 3           EKG EKG Interpretation  Date/Time:  Monday July 05 2017 10:12:30 EDT Ventricular Rate:  85 PR Interval:  152 QRS Duration: 80 QT Interval:  372 QTC Calculation: 442 R Axis:   9 Text Interpretation:  Normal sinus rhythm borderline t wave  abnormalities Confirmed by Virgel Manifold [phone]) on 07/05/2017 12:29:30 PM   Radiology Dg Chest 2 View  Result Date: 07/05/2017 CLINICAL DATA:  Sharp substernal chest pain for several hours, initial encounter EXAM: CHEST - 2 VIEW COMPARISON:  10/03/2015 FINDINGS: Cardiac shadow is within normal limits. Aortic calcifications are noted. The lungs are well aerated bilaterally. No focal infiltrate or sizable effusion is seen. No acute bony abnormality is noted. IMPRESSION: No active cardiopulmonary disease. Electronically Signed   By: Inez Catalina M.D.   On: 07/05/2017 11:20    Procedures Procedures (including critical care time)  Medications Ordered in ED Medications - No data to display   Initial Impression / Assessment and Plan / ED Course  I have reviewed the triage vital signs and the nursing notes.  Pertinent labs & imaging results that were available during my care of the patient were reviewed by me and considered in my medical decision making (see chart for details).   Patient presents to the ED with complaints of brief chest pain after stretching this AM that only reoccurs with certain positions/movements. Patient nontoxic appearing, in no apparent distress, initial vitals WNL. Physical exam is benign, some reproducibility of pain with palpation to right lower anterior chest wall. Work-up per triage reviewed: no anemia, no leukocytosis, no significant electrolyte abnormalities. Hyperglycemia at 191. CXR negative for infiltrate, effusion, pneumothorax, fractures, or dislocations. Patient's EKG without significant change from previous upon review, delta troponin negative, doubt ACS. Given presentation doubt pulmonary embolism or dissection. Unclear definitve etiology however suspect musculoskeletal given brief duration and related to certain movements/positions as well as reproducibility on exam. Recommended tylenol PRN pain. I discussed results, treatment plan, need for PCP follow-up, and  return precautions with the patient. Provided opportunity for questions, patient confirmed understanding and is in agreement with plan.   Findings and plan of care discussed with supervising physician Dr.Kohut who is in agreement with plan.   Final Clinical Impressions(s) / ED Diagnoses   Final diagnoses:  Chest pain, unspecified type    ED Discharge Orders    None       Amaryllis Dyke, PA-C 07/05/17 1546    Virgel Manifold, MD 07/05/17 1622

## 2017-07-07 DIAGNOSIS — J301 Allergic rhinitis due to pollen: Secondary | ICD-10-CM | POA: Diagnosis not present

## 2017-07-07 DIAGNOSIS — J3089 Other allergic rhinitis: Secondary | ICD-10-CM | POA: Diagnosis not present

## 2017-07-07 DIAGNOSIS — H1045 Other chronic allergic conjunctivitis: Secondary | ICD-10-CM | POA: Diagnosis not present

## 2017-07-07 DIAGNOSIS — J4520 Mild intermittent asthma, uncomplicated: Secondary | ICD-10-CM | POA: Diagnosis not present

## 2017-07-07 DIAGNOSIS — K219 Gastro-esophageal reflux disease without esophagitis: Secondary | ICD-10-CM | POA: Diagnosis not present

## 2017-07-13 DIAGNOSIS — J3089 Other allergic rhinitis: Secondary | ICD-10-CM | POA: Diagnosis not present

## 2017-07-13 DIAGNOSIS — J301 Allergic rhinitis due to pollen: Secondary | ICD-10-CM | POA: Diagnosis not present

## 2017-07-21 ENCOUNTER — Ambulatory Visit (AMBULATORY_SURGERY_CENTER): Payer: Self-pay | Admitting: *Deleted

## 2017-07-21 VITALS — Ht 65.5 in | Wt 173.0 lb

## 2017-07-21 DIAGNOSIS — Z1211 Encounter for screening for malignant neoplasm of colon: Secondary | ICD-10-CM

## 2017-07-21 MED ORDER — NA SULFATE-K SULFATE-MG SULF 17.5-3.13-1.6 GM/177ML PO SOLN: ORAL | 0 refills | Status: DC

## 2017-07-21 NOTE — Progress Notes (Signed)
Patient is allergic to Alcohol Swabs!!Patient denies any allergies to eggs or soy. Patient denies any problems with anesthesia/sedation. Patient denies any oxygen use at home. Patient denies taking any diet/weight loss medications or blood thinners. EMMI education offered, pt declined.

## 2017-07-22 DIAGNOSIS — J301 Allergic rhinitis due to pollen: Secondary | ICD-10-CM | POA: Diagnosis not present

## 2017-07-22 DIAGNOSIS — J3089 Other allergic rhinitis: Secondary | ICD-10-CM | POA: Diagnosis not present

## 2017-07-23 ENCOUNTER — Encounter: Payer: Self-pay | Admitting: Internal Medicine

## 2017-07-27 DIAGNOSIS — J3089 Other allergic rhinitis: Secondary | ICD-10-CM | POA: Diagnosis not present

## 2017-07-27 DIAGNOSIS — J301 Allergic rhinitis due to pollen: Secondary | ICD-10-CM | POA: Diagnosis not present

## 2017-07-30 ENCOUNTER — Telehealth: Payer: Self-pay | Admitting: Podiatry

## 2017-07-30 NOTE — Telephone Encounter (Signed)
Left message that the shoe that was ordered to replace the defective shoe is back ordered again and being discontinued and she maybe better off coming in and choosing a different shoe (at no charge) but to  make an appt with Liliane Channel for this.

## 2017-08-04 ENCOUNTER — Encounter: Payer: Self-pay | Admitting: Internal Medicine

## 2017-08-04 ENCOUNTER — Ambulatory Visit (AMBULATORY_SURGERY_CENTER): Payer: Medicare Other | Admitting: Internal Medicine

## 2017-08-04 VITALS — BP 146/83 | HR 72 | Resp 12 | Ht 65.5 in | Wt 173.0 lb

## 2017-08-04 DIAGNOSIS — Z1211 Encounter for screening for malignant neoplasm of colon: Secondary | ICD-10-CM

## 2017-08-04 DIAGNOSIS — D122 Benign neoplasm of ascending colon: Secondary | ICD-10-CM

## 2017-08-04 DIAGNOSIS — Z86010 Personal history of colonic polyps: Secondary | ICD-10-CM | POA: Diagnosis not present

## 2017-08-04 DIAGNOSIS — D123 Benign neoplasm of transverse colon: Secondary | ICD-10-CM | POA: Diagnosis not present

## 2017-08-04 DIAGNOSIS — I4891 Unspecified atrial fibrillation: Secondary | ICD-10-CM | POA: Diagnosis not present

## 2017-08-04 MED ORDER — SODIUM CHLORIDE 0.9 % IV SOLN: 500.0000 mL | Freq: Once | INTRAVENOUS | Status: DC

## 2017-08-04 NOTE — Patient Instructions (Signed)
Discharge instructions given. Handout on polyps. Resume previous medications. YOU HAD AN ENDOSCOPIC PROCEDURE TODAY AT THE [facility]:   Refer to the procedure report that was given to you for any specific questions about what was found during the examination.  If the procedure report does not answer your questions, please call your gastroenterologist to clarify.  If you requested that your care partner not be given the details of your procedure findings, then the procedure report has been included in a sealed envelope for you to review at your convenience later.  YOU SHOULD EXPECT: Some feelings of bloating in the abdomen. Passage of more gas than usual.  Walking can help get rid of the air that was put into your GI tract during the procedure and reduce the bloating. If you had a lower endoscopy (such as a colonoscopy or flexible sigmoidoscopy) you may notice spotting of blood in your stool or on the toilet paper. If you underwent a bowel prep for your procedure, you may not have a normal bowel movement for a few days.  Please Note:  You might notice some irritation and congestion in your nose or some drainage.  This is from the oxygen used during your procedure.  There is no need for concern and it should clear up in a day or so.  SYMPTOMS TO REPORT IMMEDIATELY:   Following lower endoscopy (colonoscopy or flexible sigmoidoscopy):  Excessive amounts of blood in the stool  Significant tenderness or worsening of abdominal pains  Swelling of the abdomen that is new, acute  Fever of 100F or higher   For urgent or emergent issues, a gastroenterologist can be reached at any hour by calling [phone].   DIET:  We do recommend a small meal at first, but then you may proceed to your regular diet.  Drink plenty of fluids but you should avoid alcoholic beverages for 24 hours.  ACTIVITY:  You should plan to take it easy for the rest of today and you should NOT DRIVE or use heavy  machinery until tomorrow (because of the sedation medicines used during the test).    FOLLOW UP: Our staff will call the number listed on your records the next business day following your procedure to check on you and address any questions or concerns that you may have regarding the information given to you following your procedure. If we do not reach you, we will leave a message.  However, if you are feeling well and you are not experiencing any problems, there is no need to return our call.  We will assume that you have returned to your regular daily activities without incident.  If any biopsies were taken you will be contacted by phone or by letter within the next 1-3 weeks.  Please call us at [phone] if you have not heard about the biopsies in 3 weeks.    SIGNATURES/CONFIDENTIALITY: You and/or your care partner have signed paperwork which will be entered into your electronic medical record.  These signatures attest to the fact that that the information above on your After Visit Summary has been reviewed and is understood.  Full responsibility of the confidentiality of this discharge information lies with you and/or your care-partner. 

## 2017-08-04 NOTE — Progress Notes (Signed)
I have reviewed the patient's medical history in detail and updated the computerized patient record.

## 2017-08-04 NOTE — Progress Notes (Signed)
Called to room to assist during endoscopic procedure.  Patient ID and intended procedure confirmed with present staff. Received instructions for my participation in the procedure from the performing physician.  

## 2017-08-04 NOTE — Op Note (Signed)
[place] Patient Name: [name] Procedure Date: 08/04/2017 10:33 AM MRN: [id number] Endoscopist: Docia Chuck. Henrene Pastor , MD Age: 67 Referring MD:  Date of Birth: [birth year] Gender: Female Account #: [id number] Procedure:                Colonoscopy, With cold snare polypectomy x 2 Indications:              Screening for colorectal malignant neoplasm.                            Negative index examination 2009 Medicines:                Monitored Anesthesia Care Procedure:                Pre-Anesthesia Assessment:                           - Prior to the procedure, a History and Physical                            was performed, and patient medications and                            allergies were reviewed. The patient's tolerance of                            previous anesthesia was also reviewed. The risks                            and benefits of the procedure and the sedation                            options and risks were discussed with the patient.                            All questions were answered, and informed consent                            was obtained. Prior Anticoagulants: The patient has                            taken no previous anticoagulant or antiplatelet                            agents. ASA Grade Assessment: II - A patient with                            mild systemic disease. After reviewing the risks                            and benefits, the patient was deemed in                            satisfactory condition to undergo the procedure.  After obtaining informed consent, the colonoscope                            was passed under direct vision. Throughout the                            procedure, the patient's blood pressure, pulse, and                            oxygen saturations were monitored continuously. The                            Colonoscope was introduced through the anus and   advanced to the the cecum, identified by                            appendiceal orifice and ileocecal valve. The                            ileocecal valve, appendiceal orifice, and rectum                            were photographed. The quality of the bowel                            preparation was excellent. The colonoscopy was                            performed without difficulty. The patient tolerated                            the procedure well. The bowel preparation used was                            SUPREP. Scope In: 10:43:09 AM Scope Out: 10:55:54 AM Scope Withdrawal Time: 0 hours 10 minutes 4 seconds  Total Procedure Duration: 0 hours 12 minutes 45 seconds  Findings:                 Two polyps were found in the transverse colon and                            ascending colon. The polyps were 2 to 4 mm in size.                            These polyps were removed with a cold snare.                            Resection and retrieval were complete.                           The exam was otherwise without abnormality on                            direct and  retroflexion views. Complications:            No immediate complications. Estimated blood loss:                            None. Estimated Blood Loss:     Estimated blood loss: none. Impression:               - Two 2 to 4 mm polyps in the transverse colon and                            in the ascending colon, removed with a cold snare.                            Resected and retrieved.                           - The examination was otherwise normal on direct                            and retroflexion views. Recommendation:           - Repeat colonoscopy in 5 years for surveillance.                           - Patient has a contact number available for                            emergencies. The signs and symptoms of potential                            delayed complications were discussed with the                             patient. Return to normal activities tomorrow.                            Written discharge instructions were provided to the                            patient.                           - Resume previous diet.                           - Continue present medications.                           - Await pathology results. Docia Chuck. Henrene Pastor, MD 08/04/2017 11:00:07 AM This report has been signed electronically.

## 2017-08-04 NOTE — Progress Notes (Signed)
To PACU  Awake and alert, report to RN 

## 2017-08-05 ENCOUNTER — Telehealth: Payer: Self-pay

## 2017-08-05 DIAGNOSIS — J3089 Other allergic rhinitis: Secondary | ICD-10-CM | POA: Diagnosis not present

## 2017-08-05 DIAGNOSIS — J301 Allergic rhinitis due to pollen: Secondary | ICD-10-CM | POA: Diagnosis not present

## 2017-08-05 NOTE — Telephone Encounter (Signed)
  Follow up Call-  Call back number 08/04/2017  Post procedure Call Back phone  # [phone]  Permission to leave phone message Yes  Some recent data might be hidden     Patient questions:  Do you have a fever, pain , or abdominal swelling? No. Pain Score  0 *  Have you tolerated food without any problems? Yes.    Have you been able to return to your normal activities? Yes.    Do you have any questions about your discharge instructions: Diet   No. Medications  No. Follow up visit  No.  Do you have questions or concerns about your Care? No.  Actions: * If pain score is 4 or above: No action needed, pain <4.

## 2017-08-09 ENCOUNTER — Encounter: Payer: Self-pay | Admitting: Internal Medicine

## 2017-08-09 NOTE — Telephone Encounter (Signed)
lvm on cell that shoes have come in and to come pick them up.Marland Kitchen

## 2017-08-11 DIAGNOSIS — J301 Allergic rhinitis due to pollen: Secondary | ICD-10-CM | POA: Diagnosis not present

## 2017-08-11 DIAGNOSIS — J3089 Other allergic rhinitis: Secondary | ICD-10-CM | POA: Diagnosis not present

## 2017-08-16 DIAGNOSIS — J3089 Other allergic rhinitis: Secondary | ICD-10-CM | POA: Diagnosis not present

## 2017-08-16 DIAGNOSIS — J301 Allergic rhinitis due to pollen: Secondary | ICD-10-CM | POA: Diagnosis not present

## 2017-08-17 DIAGNOSIS — H25811 Combined forms of age-related cataract, right eye: Secondary | ICD-10-CM | POA: Diagnosis not present

## 2017-08-17 DIAGNOSIS — H25011 Cortical age-related cataract, right eye: Secondary | ICD-10-CM | POA: Diagnosis not present

## 2017-08-17 DIAGNOSIS — H25041 Posterior subcapsular polar age-related cataract, right eye: Secondary | ICD-10-CM | POA: Diagnosis not present

## 2017-08-17 DIAGNOSIS — H2511 Age-related nuclear cataract, right eye: Secondary | ICD-10-CM | POA: Diagnosis not present

## 2017-08-25 DIAGNOSIS — J3089 Other allergic rhinitis: Secondary | ICD-10-CM | POA: Diagnosis not present

## 2017-08-25 DIAGNOSIS — J301 Allergic rhinitis due to pollen: Secondary | ICD-10-CM | POA: Diagnosis not present

## 2017-08-26 ENCOUNTER — Other Ambulatory Visit: Payer: Self-pay

## 2017-08-26 DIAGNOSIS — F411 Generalized anxiety disorder: Secondary | ICD-10-CM

## 2017-08-26 NOTE — Telephone Encounter (Signed)
Will fill after appointment with me scheduled 08/31/2017. Please advise.  [name], [place], PGY-3

## 2017-08-27 DIAGNOSIS — J3089 Other allergic rhinitis: Secondary | ICD-10-CM | POA: Diagnosis not present

## 2017-08-27 DIAGNOSIS — J301 Allergic rhinitis due to pollen: Secondary | ICD-10-CM | POA: Diagnosis not present

## 2017-08-30 NOTE — Progress Notes (Signed)
Subjective   Patient ID: [name]    DOB: August 02, 1950, 67 y.o. female   MRN: [id number]  CC: "Need refills"  HPI: [name] is a 67 y.o. female who presents to clinic today for the following:  Diabetes: Patient with long-standing diabetes well controlled with use of metformin and glipizide.  She has no history of insulin use.  CBGs average fasting 130-160, low 90, postprandial 160-180s and occasionally 200s.  She is not amenable to GLP-1 agonists.  She does take gabapentin for her peripheral neuropathy with significant improvement since increasing to 900 mg 3 times daily.  She does not exercise.  Hypertension: Patient has long-standing hypertension well-controlled.  She is here requesting refills for all of her medications since transferring pharmacies.  She is a current everyday smoker.  Smoking: Patient is a current everyday smoker.  She reports smoking 1 pack/day of cigarettes only she quit when she was pregnant but has since had difficulty with cessation.  She is not interested in cessation today.  She denies fevers or chills, unexplained weight loss, shortness of breath, chest pain.  Anxiety: Patient here today requesting refills for her lorazepam.  This is been chronic for her over the past decade with 0.5 mg twice daily use.  Discussion was made about transitioning to SSRI, however patient declined.  ROS: see HPI for pertinent.  [place]: DM with neuropathy, HLD, dysphagia, HTN, GERD, PAF (s/p ablation), tobacco use disorder.  Surgical history appendectomy, TAH, dental extraction.  Family history HTN, stroke, DM, stomach cancer (mother).  Smoking status reviewed. Medications reviewed.  Objective   BP 138/78   Pulse 80   Temp 98.3 F (36.8 C) (Oral)   Ht 5' 5.5" (1.664 m)   Wt 169 lb 6.4 oz (76.8 kg)   SpO2 96%   BMI 27.76 kg/m  Vitals and nursing note reviewed.  General: well nourished, well developed, NAD with non-toxic appearance HEENT: normocephalic,  atraumatic, moist mucous membranes Neck: supple, non-tender without lymphadenopathy Cardiovascular: regular rate and rhythm without murmurs, rubs, or gallops Lungs: clear to auscultation bilaterally with normal work of breathing Abdomen: soft, non-tender, non-distended, normoactive bowel sounds Skin: warm, dry, no rashes or lesions, cap refill < 2 seconds Extremities: warm and well perfused, normal tone, no edema Psych: euthymic mood, congruent affect  Assessment & Plan   Controlled diabetes mellitus with neurologic complication, without long-term current use of insulin (HCC) Chronic.  Controlled with A1c 7.8 based on goal less than 8.0.  Has underlying peripheral neuropathy well controlled with use of gabapentin.  Recently evaluated by ophthalmology. - Given refills for metformin 1000 mg twice daily and glipizide 10 mg daily along with lancets and glucose strips - Need to review whether patient needs pneumococcal vaccine - Next A1c due 12/01/2017, RTC 3 months  Primary hypertension Chronic.  Controlled.  Current everyday smoker.  Poor candidate for cessation at present. - Given refills for atenolol 25 mg daily, HCTZ 12.5 mg daily, Cozaar 50 mg daily, Lipitor 40 mg daily - Discussed smoking cessation  Orders Placed This Encounter  Procedures  . HgB A1c   Meds ordered this encounter  Medications  . atenolol (TENORMIN) 25 MG tablet    Sig: TAKE 1 TABLET(S) BY MOUTH DAILY    Dispense:  90 tablet    Refill:  3  . atorvastatin (LIPITOR) 40 MG tablet    Sig: Take 1 tablet (40 mg total) by mouth daily.    Dispense:  90 tablet    Refill:  3  . baclofen (LIORESAL) 10 MG tablet    Sig: Take 1 tablet (10 mg total) by mouth daily as needed for muscle spasms.    Dispense:  30 tablet    Refill:  1  . gabapentin (NEURONTIN) 300 MG capsule    Sig: Take 3 capsules (900 mg total) by mouth 2 (two) times daily as needed.    Dispense:  270 capsule    Refill:  3  . glipiZIDE (GLUCOTROL) 10 MG  tablet    Sig: TAKE ONE TABLET BY MOUTH TWICE A DAY BEFORE A MEAL    Dispense:  180 tablet    Refill:  3  . hydrochlorothiazide (MICROZIDE) 12.5 MG capsule    Sig: Take 1 capsule (12.5 mg total) by mouth daily.    Dispense:  90 capsule    Refill:  3  . Lancets (ONETOUCH ULTRASOFT) lancets    Sig: CHECK BLOOD SUGAR 3 TIMES DAILY.    Dispense:  100 each    Refill:  5  . LORazepam (ATIVAN) 0.5 MG tablet    Sig: Take 1 tablet (0.5 mg total) by mouth 2 (two) times daily as needed for anxiety.    Dispense:  60 tablet    Refill:  0  . losartan (COZAAR) 50 MG tablet    Sig: Take 1 tablet (50 mg total) by mouth daily.    Dispense:  90 tablet    Refill:  3  . metFORMIN (GLUCOPHAGE) 1000 MG tablet    Sig: Take 1 tablet (1,000 mg total) by mouth 2 (two) times daily with a meal.    Dispense:  180 tablet    Refill:  3  . ONE TOUCH ULTRA TEST test strip    Sig: Check blood sugar daily as needed.    Dispense:  100 each    Refill:  5  . citalopram (CELEXA) 20 MG tablet    Sig: Take 1 tablet (20 mg total) by mouth daily.    Dispense:  90 tablet    Refill:  0    [name], DO [place], PGY-3 08/31/2017, 8:50 PM

## 2017-08-31 ENCOUNTER — Other Ambulatory Visit: Payer: Self-pay

## 2017-08-31 ENCOUNTER — Ambulatory Visit (INDEPENDENT_AMBULATORY_CARE_PROVIDER_SITE_OTHER): Payer: Medicare Other | Admitting: Family Medicine

## 2017-08-31 ENCOUNTER — Encounter: Payer: Self-pay | Admitting: Family Medicine

## 2017-08-31 VITALS — BP 138/78 | HR 80 | Temp 98.3°F | Ht 65.5 in | Wt 169.4 lb

## 2017-08-31 DIAGNOSIS — F411 Generalized anxiety disorder: Secondary | ICD-10-CM

## 2017-08-31 DIAGNOSIS — F17200 Nicotine dependence, unspecified, uncomplicated: Secondary | ICD-10-CM | POA: Diagnosis not present

## 2017-08-31 DIAGNOSIS — I10 Essential (primary) hypertension: Secondary | ICD-10-CM | POA: Diagnosis not present

## 2017-08-31 DIAGNOSIS — E0840 Diabetes mellitus due to underlying condition with diabetic neuropathy, unspecified: Secondary | ICD-10-CM | POA: Diagnosis not present

## 2017-08-31 LAB — POCT GLYCOSYLATED HEMOGLOBIN (HGB A1C): HbA1c, POC (controlled diabetic range): 7.9 % — AB (ref 0.0–7.0)

## 2017-08-31 MED ORDER — HYDROCHLOROTHIAZIDE 12.5 MG PO CAPS: 12.5000 mg | ORAL_CAPSULE | Freq: Every day | ORAL | 3 refills | Status: DC

## 2017-08-31 MED ORDER — LOSARTAN POTASSIUM 50 MG PO TABS: 50.0000 mg | ORAL_TABLET | Freq: Every day | ORAL | 3 refills | Status: DC

## 2017-08-31 MED ORDER — GLIPIZIDE 10 MG PO TABS: ORAL_TABLET | ORAL | 3 refills | Status: DC

## 2017-08-31 MED ORDER — METFORMIN HCL 1000 MG PO TABS: 1000.0000 mg | ORAL_TABLET | Freq: Two times a day (BID) | ORAL | 3 refills | Status: DC

## 2017-08-31 MED ORDER — ONETOUCH ULTRA BLUE VI STRP: ORAL_STRIP | 5 refills | Status: DC

## 2017-08-31 MED ORDER — ONETOUCH ULTRASOFT LANCETS MISC: 5 refills | Status: DC

## 2017-08-31 MED ORDER — ATORVASTATIN CALCIUM 40 MG PO TABS: 40.0000 mg | ORAL_TABLET | Freq: Every day | ORAL | 3 refills | Status: DC

## 2017-08-31 MED ORDER — LORAZEPAM 0.5 MG PO TABS: 0.5000 mg | ORAL_TABLET | Freq: Two times a day (BID) | ORAL | 0 refills | Status: DC | PRN

## 2017-08-31 MED ORDER — GABAPENTIN 300 MG PO CAPS: 900.0000 mg | ORAL_CAPSULE | Freq: Two times a day (BID) | ORAL | 3 refills | Status: DC | PRN

## 2017-08-31 MED ORDER — CITALOPRAM HYDROBROMIDE 20 MG PO TABS: 20.0000 mg | ORAL_TABLET | Freq: Every day | ORAL | 0 refills | Status: DC

## 2017-08-31 MED ORDER — BACLOFEN 10 MG PO TABS: 10.0000 mg | ORAL_TABLET | Freq: Every day | ORAL | 1 refills | Status: DC | PRN

## 2017-08-31 MED ORDER — ATENOLOL 25 MG PO TABS: ORAL_TABLET | ORAL | 3 refills | Status: DC

## 2017-08-31 NOTE — Assessment & Plan Note (Signed)
Chronic.  Controlled.  Current everyday smoker.  Poor candidate for cessation at present. - Given refills for atenolol 25 mg daily, HCTZ 12.5 mg daily, Cozaar 50 mg daily, Lipitor 40 mg daily - Discussed smoking cessation

## 2017-08-31 NOTE — Assessment & Plan Note (Signed)
Chronic.  Long-standing use of lorazepam without alternative controller.  After extensive discussion about need for dampening benzo use, patient is amenable to SSRI. - Initiating Celexa 20 mg daily - Given refill for lorazepam 0.5 mg twice daily as needed with plan to wean gradually over the next several months - RTC 2 months

## 2017-08-31 NOTE — Patient Instructions (Signed)
Thank you for coming in to see [place] today. Please see below to review our plan for today's visit.  1.  We will begin Celexa for your anxiety.  Start taking a 20 mg tablet in the morning each day.  You can also take your lorazepam as scheduled in the meantime.  Our goal will be to wean you off the lorazepam over the next several months but we will do this carefully while you are increasing her Celexa dose. 2.  I have filled the rest of your prescription as requested. 3.  I would like to see you again in about 2 months to discuss your anxiety.  Please call the clinic at [phone] if your symptoms worsen or you have any concerns. It was our pleasure to serve you.  [name], [place], PGY-3

## 2017-08-31 NOTE — Assessment & Plan Note (Signed)
Chronic.  Controlled with A1c 7.8 based on goal less than 8.0.  Has underlying peripheral neuropathy well controlled with use of gabapentin.  Recently evaluated by ophthalmology. - Given refills for metformin 1000 mg twice daily and glipizide 10 mg daily along with lancets and glucose strips - Need to review whether patient needs pneumococcal vaccine - Next A1c due 12/01/2017, RTC 3 months

## 2017-09-02 DIAGNOSIS — J3089 Other allergic rhinitis: Secondary | ICD-10-CM | POA: Diagnosis not present

## 2017-09-02 DIAGNOSIS — J301 Allergic rhinitis due to pollen: Secondary | ICD-10-CM | POA: Diagnosis not present

## 2017-09-14 DIAGNOSIS — H25042 Posterior subcapsular polar age-related cataract, left eye: Secondary | ICD-10-CM | POA: Diagnosis not present

## 2017-09-14 DIAGNOSIS — H2512 Age-related nuclear cataract, left eye: Secondary | ICD-10-CM | POA: Diagnosis not present

## 2017-09-14 DIAGNOSIS — H25812 Combined forms of age-related cataract, left eye: Secondary | ICD-10-CM | POA: Diagnosis not present

## 2017-09-14 DIAGNOSIS — H25012 Cortical age-related cataract, left eye: Secondary | ICD-10-CM | POA: Diagnosis not present

## 2017-09-17 DIAGNOSIS — J301 Allergic rhinitis due to pollen: Secondary | ICD-10-CM | POA: Diagnosis not present

## 2017-09-17 DIAGNOSIS — J3089 Other allergic rhinitis: Secondary | ICD-10-CM | POA: Diagnosis not present

## 2017-09-23 DIAGNOSIS — J3089 Other allergic rhinitis: Secondary | ICD-10-CM | POA: Diagnosis not present

## 2017-09-23 DIAGNOSIS — J301 Allergic rhinitis due to pollen: Secondary | ICD-10-CM | POA: Diagnosis not present

## 2017-09-30 DIAGNOSIS — J3089 Other allergic rhinitis: Secondary | ICD-10-CM | POA: Diagnosis not present

## 2017-09-30 DIAGNOSIS — J301 Allergic rhinitis due to pollen: Secondary | ICD-10-CM | POA: Diagnosis not present

## 2017-10-04 ENCOUNTER — Ambulatory Visit (INDEPENDENT_AMBULATORY_CARE_PROVIDER_SITE_OTHER): Payer: Medicare Other | Admitting: Family Medicine

## 2017-10-04 VITALS — BP 118/80 | HR 78 | Temp 98.6°F | Wt 170.8 lb

## 2017-10-04 DIAGNOSIS — E0840 Diabetes mellitus due to underlying condition with diabetic neuropathy, unspecified: Secondary | ICD-10-CM

## 2017-10-04 DIAGNOSIS — L02212 Cutaneous abscess of back [any part, except buttock]: Secondary | ICD-10-CM | POA: Diagnosis not present

## 2017-10-04 DIAGNOSIS — F411 Generalized anxiety disorder: Secondary | ICD-10-CM | POA: Diagnosis not present

## 2017-10-04 DIAGNOSIS — F4310 Post-traumatic stress disorder, unspecified: Secondary | ICD-10-CM | POA: Diagnosis not present

## 2017-10-04 LAB — POCT URINALYSIS DIP (MANUAL ENTRY)
Blood, UA: NEGATIVE
Glucose, UA: 500 mg/dL — AB
Leukocytes, UA: NEGATIVE
Nitrite, UA: NEGATIVE
Protein Ur, POC: NEGATIVE mg/dL
Spec Grav, UA: >=1.03 — AB (ref 1.010–1.025)
Urobilinogen, UA: 0.2 E.U./dL
pH, UA: 5.5 (ref 5.0–8.0)

## 2017-10-04 MED ORDER — DOXYCYCLINE HYCLATE 100 MG PO TABS: 100.0000 mg | ORAL_TABLET | Freq: Two times a day (BID) | ORAL | 0 refills | Status: DC

## 2017-10-04 MED ORDER — LORAZEPAM 0.5 MG PO TABS: ORAL_TABLET | ORAL | 0 refills | Status: DC

## 2017-10-04 MED ORDER — CITALOPRAM HYDROBROMIDE 20 MG PO TABS: 20.0000 mg | ORAL_TABLET | Freq: Every day | ORAL | 0 refills | Status: DC

## 2017-10-04 NOTE — Patient Instructions (Addendum)
Thank you for coming in to see [place] today. Please see below to review our plan for today's visit.  1.  Your kidneys appear to be functioning appropriately.  We will have you follow-up November for your diabetes. 2.  Your shoulder appears to be an infected sebaceous cyst which are the cyst which produce oils on your skin.  Take the doxycycline twice daily for 10 days.  I will have you follow-up on October 7th at 4:10 PM at which point we can drain the cyst. Please arrive 30 mins early. 3.  I do believe he would benefit greatly from the use of Celexa for your anxiety.  Take the 20 mg tablet daily.  We will discuss this at your next follow-up and likely increase the medication in about 1 month.  I have refilled your lorazepam at a lower dose.  Take the medication daily for 2 weeks, then decrease to 1 tablet every other day until you complete the medication.  Please call the clinic at [phone] if your symptoms worsen or you have any concerns. It was our pleasure to serve you.  [name], [place], PGY-3
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
## 2017-10-04 NOTE — Progress Notes (Signed)
Called [facility] on [address] at [phone] and cancelled RX order per patient and Dr. Yisroel Ramming. Joe was at pharmacy seemed confused as to what I was asking and wanted to put the RX's on hold. I informed him that patient would not be picking them up from there at all and was using a new pharmacy.  Ozella Almond, [place]

## 2017-10-05 ENCOUNTER — Encounter: Payer: Self-pay | Admitting: Family Medicine

## 2017-10-05 DIAGNOSIS — L723 Sebaceous cyst: Secondary | ICD-10-CM | POA: Insufficient documentation
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
## 2017-10-05 NOTE — Assessment & Plan Note (Signed)
Chronic.  No history of nephropathy.  UA without infection or microcytic hematuria. - RTC 2 months for next A1c

## 2017-10-05 NOTE — Assessment & Plan Note (Signed)
Chronic.  Likely sebaceous cyst given history of purulent discharge.  Patient without constitutional symptoms. - Doxycycline 100 mg twice daily for 10 days and instructed to follow-up on 10/18/2017 for I&D - Reviewed return precautions

## 2017-10-08 DIAGNOSIS — J3089 Other allergic rhinitis: Secondary | ICD-10-CM | POA: Diagnosis not present

## 2017-10-08 DIAGNOSIS — J301 Allergic rhinitis due to pollen: Secondary | ICD-10-CM | POA: Diagnosis not present

## 2017-10-15 DIAGNOSIS — J3089 Other allergic rhinitis: Secondary | ICD-10-CM | POA: Diagnosis not present

## 2017-10-15 DIAGNOSIS — J301 Allergic rhinitis due to pollen: Secondary | ICD-10-CM | POA: Diagnosis not present

## 2017-10-18 ENCOUNTER — Encounter: Payer: Self-pay | Admitting: Family Medicine

## 2017-10-18 ENCOUNTER — Other Ambulatory Visit: Payer: Self-pay

## 2017-10-18 ENCOUNTER — Ambulatory Visit (INDEPENDENT_AMBULATORY_CARE_PROVIDER_SITE_OTHER): Payer: Medicare Other | Admitting: Family Medicine

## 2017-10-18 VITALS — BP 122/72 | HR 97 | Temp 99.7°F | Ht 65.5 in | Wt 166.6 lb

## 2017-10-18 DIAGNOSIS — L723 Sebaceous cyst: Secondary | ICD-10-CM | POA: Diagnosis not present

## 2017-10-18 DIAGNOSIS — Z23 Encounter for immunization: Secondary | ICD-10-CM | POA: Diagnosis not present

## 2017-10-18 MED ORDER — DOXYCYCLINE HYCLATE 100 MG PO TABS: 100.0000 mg | ORAL_TABLET | Freq: Two times a day (BID) | ORAL | 0 refills | Status: DC

## 2017-10-18 NOTE — Assessment & Plan Note (Addendum)
Resolved status post self I&D at home.  No signs of secondary infection. - Given prophylactic paper prescription for doxycycline - Reviewed return precautions - Advised patient to return to clinic for future drainage - Administer flu and PPSV23 vaccine

## 2017-10-18 NOTE — Progress Notes (Signed)
   Subjective   Patient ID: [name]    DOB: 07-28-1950, 67 y.o. female   MRN: [id number]  CC: "Cyst"  HPI: [name] is a 67 y.o. female who presents to clinic today for the following:  Cyst: Patient is here today to follow-up left shoulder cyst.  She has applied warm compress to the area since she was last seen in September.  She saw her chiropractor who recommended an over-the-counter dressing which she applied to her cyst and form today blackhead.  Patient lanced the cyst on her own at home and expressed significant purulent drainage approximately 2 days ago.  She reported pain at the associated site after removing the discharge but has significant improvement of pain since.  She denies associated erythema, fevers or chills, or induration of the associated area.  ROS: see HPI for pertinent.  [place]: DM with neuropathy, HLD, dysphagia, HTN, GERD,PAF (s/p ablation),tobacco use disorder.Surgical history appendectomy, TAH, dental extraction. Family history HTN, stroke, DM, stomach cancer (mother). Smoking status reviewed. Medications reviewed.  Objective   BP 122/72   Pulse 97   Temp 99.7 F (37.6 C) (Oral)   Ht 5' 5.5" (1.664 m)   Wt 166 lb 9.6 oz (75.6 kg)   SpO2 95%   BMI 27.30 kg/m  Vitals and nursing note reviewed.  General: well nourished, well developed, NAD with non-toxic appearance HEENT: normocephalic, atraumatic, moist mucous membranes Cardiovascular: regular rate and rhythm without murmurs, rubs, or gallops Lungs: clear to auscultation bilaterally with normal work of breathing Abdomen: soft, non-tender, non-distended, normoactive bowel sounds Skin: warm, dry, cap refill < 2 seconds, healing scab on left shoulder blade with associated ecchymoses at site without erythema or induration      Assessment & Plan   Sebaceous cyst Resolved status post self I&D at home.  No signs of secondary infection. - Given prophylactic paper prescription for  doxycycline - Reviewed return precautions - Advised patient to return to clinic for future drainage - Administer flu and PPSV23 vaccine  Orders Placed This Encounter  Procedures  . Flu Vaccine QUAD 36+ mos IM  . Pneumococcal conjugate vaccine 13-valent   Meds ordered this encounter  Medications  . doxycycline (VIBRA-TABS) 100 MG tablet    Sig: Take 1 tablet (100 mg total) by mouth 2 (two) times daily.    Dispense:  20 tablet    Refill:  0    Tammy Butte, DO [place], PGY-3 10/18/2017, 5:16 PM

## 2017-10-18 NOTE — Patient Instructions (Signed)
Thank you for coming in to see [place] today. Please see below to review our plan for today's visit.  If you develop fevers or chills, began having significant redness around the site, fill the doxycycline prescription and take as instructed.  For cyst will likely return in the future.  Make sure you never lanced these on your own at home as this can cause infection.  We can attempt to remove the sac next time to prevent future recurrences.  Please call the clinic at [phone] if your symptoms worsen or you have any concerns. It was our pleasure to serve you.  [name], [place], PGY-3

## 2017-10-22 DIAGNOSIS — J301 Allergic rhinitis due to pollen: Secondary | ICD-10-CM | POA: Diagnosis not present

## 2017-10-22 DIAGNOSIS — J3089 Other allergic rhinitis: Secondary | ICD-10-CM | POA: Diagnosis not present

## 2017-10-28 ENCOUNTER — Other Ambulatory Visit: Payer: Self-pay | Admitting: Internal Medicine

## 2017-10-28 ENCOUNTER — Telehealth: Payer: Self-pay

## 2017-10-28 DIAGNOSIS — J301 Allergic rhinitis due to pollen: Secondary | ICD-10-CM | POA: Diagnosis not present

## 2017-10-28 DIAGNOSIS — J3089 Other allergic rhinitis: Secondary | ICD-10-CM | POA: Diagnosis not present

## 2017-10-28 DIAGNOSIS — E1140 Type 2 diabetes mellitus with diabetic neuropathy, unspecified: Secondary | ICD-10-CM

## 2017-10-28 NOTE — Telephone Encounter (Signed)
I am not sure why she is on this.  She had some cramping in the past?  This is not an appropriate medication for cramping.  Reviewed database with most recent prescription filled on 02/05/2017 for prescription written on 11/27/2016.  I do not see any recent prescriptions written for the date of 05/27/2017.  I will therefore not fill this prescription at this time.  Patient can feel free to schedule an appointment if she feels she needs this medication for another reason.  [name], [place], PGY-3

## 2017-10-28 NOTE — Telephone Encounter (Signed)
[facility] of [facility] Rx from previous PCP, Dr Ola Spurr, from [place] of 05/27/17.  Fax states PA required however Tramdol not on current med list. Please advise.  [facility] [phone]  Danley Danker, RN [facility] RN)

## 2017-11-03 ENCOUNTER — Encounter: Payer: Self-pay | Admitting: Podiatry

## 2017-11-03 ENCOUNTER — Telehealth: Payer: Self-pay

## 2017-11-03 ENCOUNTER — Ambulatory Visit: Payer: Medicare Other | Admitting: Podiatry

## 2017-11-03 DIAGNOSIS — B351 Tinea unguium: Secondary | ICD-10-CM

## 2017-11-03 DIAGNOSIS — E1140 Type 2 diabetes mellitus with diabetic neuropathy, unspecified: Secondary | ICD-10-CM | POA: Diagnosis not present

## 2017-11-03 NOTE — Telephone Encounter (Signed)
Called patient concerning Tramadol.  Patient states that she takes magnesium for cramping.  She takes Tramadol for buttock pain. Patient is in a lot of pain.  [name], [place]

## 2017-11-03 NOTE — Progress Notes (Signed)
Patient ID: [name], female   DOB: 1950-12-09, 67 y.o.   MRN: [id number] Complaint:  Visit Type: Patient returns to my office for continued preventative foot care services. Complaint: Patient states" my nails have grown long and thick and become painful to walk and wear shoes" Patient has been diagnosed with DM with neuropathy.. The patient presents for preventative foot care services. No changes to ROS.   Podiatric Exam: Vascular: dorsalis pedis and posterior tibial pulses are palpable bilateral. Capillary return is immediate. Temperature gradient is WNL. Skin turgor WNL  Sensorium: Diminished  Semmes Weinstein monofilament test. Normal tactile sensation bilaterally. Nail Exam: Pt has thick disfigured discolored nails with subungual debris noted bilateral entire nail hallux through fifth toenails Ulcer Exam: There is no evidence of ulcer or pre-ulcerative changes or infection. Orthopedic Exam: Muscle tone and strength are WNL. No limitations in general ROM. No crepitus or effusions noted. Foot type and digits show no abnormalities. HAV right and left. Skin: No Porokeratosis. No infection or ulcers  Diagnosis:  Onychomycosis, , Pain in right toe, pain in left toe  Treatment & Plan Procedures and Treatment: Consent by patient was obtained for treatment procedures. The patient understood the discussion of treatment and procedures well. All questions were answered thoroughly reviewed. Debridement of mycotic and hypertrophic toenails, 1 through 5 bilateral and clearing of subungual debris. No ulceration, no infection noted.   Return Visit-Office Procedure: Patient instructed to return to the office for a follow up visit 3 months   for continued evaluation and treatment.        Gardiner Barefoot DPM

## 2017-11-04 ENCOUNTER — Other Ambulatory Visit: Payer: Self-pay | Admitting: Family Medicine

## 2017-11-04 ENCOUNTER — Ambulatory Visit (INDEPENDENT_AMBULATORY_CARE_PROVIDER_SITE_OTHER): Payer: Medicare Other

## 2017-11-04 VITALS — BP 106/60 | HR 95 | Temp 97.7°F | Ht 65.5 in | Wt 166.6 lb

## 2017-11-04 DIAGNOSIS — G8929 Other chronic pain: Secondary | ICD-10-CM

## 2017-11-04 DIAGNOSIS — Z0000 Encounter for general adult medical examination without abnormal findings: Secondary | ICD-10-CM | POA: Diagnosis not present

## 2017-11-04 DIAGNOSIS — M5440 Lumbago with sciatica, unspecified side: Principal | ICD-10-CM

## 2017-11-04 MED ORDER — TRAMADOL HCL 50 MG PO TABS: 50.0000 mg | ORAL_TABLET | Freq: Two times a day (BID) | ORAL | 0 refills | Status: DC | PRN

## 2017-11-04 NOTE — Progress Notes (Signed)
I have reviewed this patients history and encounter and agree with the documentation.  [name], [place], PGY-3

## 2017-11-04 NOTE — Progress Notes (Signed)
Subjective:   [name] is a 67 y.o. female who presents for Medicare Annual (Subsequent) preventive examination.The patient was informed that the wellness visit is to identify future health risk and educate and initiate measures that can reduce risk for increased disease through the lifespan.   Review of Systems:  Physical assessment deferred to PCP.  Cardiac Risk Factors include: advanced age (>66men, >66 women);diabetes mellitus;hypertension     Objective:    Vitals: BP 106/60   Pulse 95   Temp 97.7 F (36.5 C) (Oral)   Ht 5' 5.5" (1.664 m)   Wt 166 lb 9.6 oz (75.6 kg)   SpO2 95%   BMI 27.30 kg/m   Body mass index is 27.3 kg/m.  Advanced Directives 11/04/2017 10/18/2017 08/31/2017 03/02/2017 01/26/2017 11/27/2016 10/22/2016  Does Patient Have a Medical Advance Directive? Yes No No No No No No  Type of Advance Directive Living will;Healthcare Power of Attorney - - - - - -  Does patient want to make changes to medical advance directive? No - Patient declined - - - - - -  Copy of [place] in Chart? Yes - - - - - -  Would patient like information on creating a medical advance directive? No - Patient declined No - Patient declined No - Patient declined No - Patient declined No - Patient declined No - Patient declined No - Patient declined    Tobacco Social History   Tobacco Use  Smoking Status Current Every Day Smoker  . Packs/day: 1.00  . Types: Cigarettes  Smokeless Tobacco Never Used     Ready to quit: No Counseling given: Yes  Clinical Intake:  Pre-visit preparation completed: Yes  Pain : No/denies pain Pain Score: 0-No pain Patient does have pain occasionally from Piriformis Syndrome but no pain today.    Nutritional Status: BMI 25 -29 Overweight Nutritional Risks: None Diabetes: Yes CBG done?: No. CBG this AM at home 146 Did pt. bring in CBG monitor from home?: No Glucose Meter Downloaded?: No  How often do you need to have  someone help you when you read instructions, pamphlets, or other written materials from your doctor or pharmacy?: 1 - Never What is the last grade level you completed in school?: college - 1 year  Interpreter Needed?: No    Past Medical History:  Diagnosis Date  . Allergy   . Anxiety   . AR (allergic rhinitis)   . Asthma   . Cataract   . CHF (congestive heart failure) ([place])   . Colon polyps    hyperplastic  . Disturbance of skin sensation    facial paresthesia; left  . DM2 (diabetes mellitus, type 2) ([place])   . Dyslipidemia   . Fatty liver   . GERD (gastroesophageal reflux disease)   . Hemorrhoids   . Hyperlipidemia   . Hypertension    essential, benign   . Insomnia   . Menopausal syndrome   . Palpitations    hx  . Routine general medical examination at a health care facility   . Screening for malignant neoplasm of the cervix   . Skin lesion   . SVT (supraventricular tachycardia) ([place])   . Tobacco abuse   . Weakness    Past Surgical History:  Procedure Laterality Date  . ABDOMINAL HYSTERECTOMY  1990   For fibroids benign.  Still has ovaries  . ablation for SVT  9/09   Dr. Lovena Le  . APPENDECTOMY    . CATARACT  EXTRACTION, BILATERAL    . CESAREAN SECTION    . COLONOSCOPY  2009  . dental extractions    . HEMORRHOID BANDING  2014   w/Dr.Medoff  . SIGMOIDOSCOPY  2004   Family History  Problem Relation Age of Onset  . Heart attack Father 35  . Hypertension Mother   . Stroke Mother   . Diabetes Mother   . Stomach cancer Mother   . Cancer Mother   . Diabetes Brother   . Coronary artery disease Unknown        female, 1st degree relative <50  . Stomach cancer Maternal Aunt   . Diabetes Brother   . Colon cancer Neg Hx   . Esophageal cancer Neg Hx    Social History   Socioeconomic History  . Marital status: Married    Spouse name: Not on file  . Number of children: 3  . Years of education: 38  . Highest education level: Some college, no degree  Occupational  History  . Occupation: Retired    Comment: Dance movement psychotherapist  Social Needs  . Financial resource strain: Not hard at all  . Food insecurity:    Worry: Never true    Inability: Never true  . Transportation needs:    Medical: No    Non-medical: No  Tobacco Use  . Smoking status: Current Every Day Smoker    Packs/day: 1.00    Types: Cigarettes  . Smokeless tobacco: Never Used  Substance and Sexual Activity  . Alcohol use: Not Currently  . Drug use: No  . Sexual activity: Not Currently  Lifestyle  . Physical activity:    Days per week: 0 days    Minutes per session: 0 min  . Stress: To some extent  Relationships  . Social connections:    Talks on phone: More than three times a week    Gets together: Once a week    Attends religious service: 1 to 4 times per year    Active member of club or organization: Yes    Attends meetings of clubs or organizations: More than 4 times per year    Relationship status: Married  Other Topics Concern  . Not on file  Social History Narrative   Married, does not get regular exercise. On disability.    2 miscarriages, one at 4 and one at 5 months; death at 1 day, unsure of cause.       Health Care POA: on file   Emergency Contact: Jeanene Erb (friend) [phone] Terri Piedra (friend's daughter) [phone]   End of Life Plan: Gave pt pamphlet   Lives in 2 story apartment. Handrails on stairs. Smoke alarms, has throw rugs with backing on them. No grab bars in bathroom.   Who lives with you: husband   Any pets: 0   Diet: pt has varied diet. Eats vegetables, some fruit, and meat.   Exercise: pt is disabled and does not have a regular exercise routine   Seatbelts: Pt reports wearing seatbelt when in vehicle   Sun Exposure/Protection: Pt reports wearing a hat, sunglasses and facial sunscreen when outside   Hobbies: shopping     Outpatient Encounter Medications as of 11/04/2017  Medication Sig  . acetaminophen  (TYLENOL) 325 MG tablet Take 2 tablets (650 mg total) by mouth every 6 (six) hours as needed for moderate pain.  Marland Kitchen aspirin 81 MG chewable tablet Chew 1 tablet (81 mg total) by mouth daily.  Marland Kitchen atenolol (TENORMIN) 25 MG  tablet TAKE 1 TABLET(S) BY MOUTH DAILY  . atorvastatin (LIPITOR) 40 MG tablet Take 1 tablet (40 mg total) by mouth daily.  . baclofen (LIORESAL) 10 MG tablet Take 1 tablet (10 mg total) by mouth daily as needed for muscle spasms.  . citalopram (CELEXA) 20 MG tablet Take 1 tablet (20 mg total) by mouth daily.  Marland Kitchen gabapentin (NEURONTIN) 300 MG capsule Take 3 capsules (900 mg total) by mouth 2 (two) times daily as needed.  Marland Kitchen glipiZIDE (GLUCOTROL) 10 MG tablet TAKE ONE TABLET BY MOUTH TWICE A DAY BEFORE A MEAL  . hydrochlorothiazide (MICROZIDE) 12.5 MG capsule Take 1 capsule (12.5 mg total) by mouth daily.  . Lancets (ONETOUCH ULTRASOFT) lancets CHECK BLOOD SUGAR 3 TIMES DAILY.  Marland Kitchen losartan (COZAAR) 50 MG tablet Take 1 tablet (50 mg total) by mouth daily.  . meclizine (ANTIVERT) 25 MG tablet Take 1-2 tablets (25-50 mg total) by mouth 2 (two) times daily as needed for dizziness. if having symptoms.  May repeat in 12 hours later.  . metFORMIN (GLUCOPHAGE) 1000 MG tablet Take 1 tablet (1,000 mg total) by mouth 2 (two) times daily with a meal.  . Multiple Vitamin (MULTIVITAMIN) tablet Take 1 tablet by mouth daily.  . Na Sulfate-K Sulfate-Mg Sulf 17.5-3.13-1.6 GM/177ML SOLN Suprep (no substitutions)-TAKE AS DIRECTED.  . Omega-3 Fatty Acids (FISH OIL PO) Take 300 mg by mouth daily.  . ONE TOUCH ULTRA TEST test strip CHECK BLOOD SUGAR DAILY AS NEEDED.  Marland Kitchen pantoprazole (PROTONIX) 40 MG tablet   . polyethylene glycol (MIRALAX / GLYCOLAX) packet Take 17 g by mouth every other day.  . traMADol (ULTRAM) 50 MG tablet   . triamcinolone cream (KENALOG) 0.1 % Apply 1 application topically 2 (two) times daily. For up to 2 weeks then stop.  . vitamin B-12 (CYANOCOBALAMIN) 1000 MCG tablet Take 1,000 mcg by  mouth daily.  . vitamin E 400 UNIT capsule Take 400 Units by mouth daily.  Marland Kitchen LORazepam (ATIVAN) 0.5 MG tablet TAKE 1 TABLET DAILY FOR 2 WEEKS, THEN 1 TABLET EVERY OTHER DAY FOR 2 WEEKS. (Patient not taking: Reported on 11/04/2017)  . Magnesium 400 MG TABS Take 1 tablet by mouth daily.  . [DISCONTINUED] doxycycline (VIBRA-TABS) 100 MG tablet Take 1 tablet (100 mg total) by mouth 2 (two) times daily.  . [DISCONTINUED] ONE TOUCH ULTRA TEST test strip Check blood sugar daily as needed.   No facility-administered encounter medications on file as of 11/04/2017.     Activities of Daily Living In your present state of health, do you have any difficulty performing the following activities: 11/04/2017  Hearing? N  Vision? N  Difficulty concentrating or making decisions? N  Walking or climbing stairs? N  Dressing or bathing? N  Doing errands, shopping? N  Preparing Food and eating ? N  Using the Toilet? N  In the past six months, have you accidently leaked urine? N  Do you have problems with loss of bowel control? N  Managing your Medications? N  Managing your Finances? N  Housekeeping or managing your Housekeeping? N  Some recent data might be hidden    Patient Care Team: Rollingwood Bing, DO as PCP - General (Family Medicine) Sharyne Peach, MD as Consulting Physician (Ophthalmology) Harold Hedge, Darrick Grinder, MD as Consulting Physician (Allergy and Immunology) Katy Apo, MD as Consulting Physician (Ophthalmology) Meylor, Marlou Sa, [place] (Chiropractic Medicine) Gardiner Barefoot, DPM as Consulting Physician (Podiatry)    Assessment:   This is a routine wellness examination for [place].  Exercise Activities and Dietary recommendations Current Exercise Habits: The patient does not participate in regular exercise at present, Exercise limited by: orthopedic condition(s)  Goals    . Blood Pressure < 140/90    . HEMOGLOBIN A1C < 7.0       Fall Risk Fall Risk  11/04/2017 10/18/2017 08/31/2017  03/02/2017 01/26/2017  Falls in the past year? No No No No No  Number falls in past yr: - - - - -  Injury with Fall? - - - - -  Risk for fall due to : - - - - -  Risk for fall due to: Comment - - - - -   Is the patient's home free of loose throw rugs in walkways, pet beds, electrical cords, etc?   yes      Grab bars in the bathroom? no      Handrails on the stairs?   yes      Adequate lighting?   yes   Depression Screen PHQ 2/9 Scores 11/04/2017 10/18/2017 08/31/2017 05/27/2017  PHQ - 2 Score 0 0 0 1  PHQ- 9 Score - - - -     Cognitive Function MMSE - Mini Mental State Exam 11/04/2017 06/22/2013  Orientation to time 5 5  Orientation to Place 5 5  Registration 3 3  Attention/ Calculation 5 5  Recall 3 3  Language- name 2 objects 2 2  Language- repeat 1 1  Language- follow 3 step command 3 3  Language- read & follow direction 1 1  Write a sentence 1 1  Copy design 1 1  Total score 30 30     6CIT Screen 11/04/2017  What Year? 0 points  What month? 0 points  What time? 0 points  Count back from 20 0 points  Months in reverse 0 points  Repeat phrase 0 points  Total Score 0    Immunization History  Administered Date(s) Administered  . Influenza Whole 01/12/2005, 10/03/2007, 11/11/2012  . Influenza,inj,Quad PF,6+ Mos 10/08/2014, 10/18/2017  . Influenza-Unspecified 10/13/2015  . Pneumococcal Conjugate-13 10/18/2017  . Pneumococcal Polysaccharide-23 10/12/2001, 09/09/2012  . Td 03/15/2008    Screening Tests Health Maintenance  Topic Date Due  . FOOT EXAM  11/27/2017  . OPHTHALMOLOGY EXAM  02/19/2018  . HEMOGLOBIN A1C  03/03/2018  . TETANUS/TDAP  03/16/2018  . PNA vac Low Risk Adult (2 of 2 - PPSV23) 10/19/2018  . MAMMOGRAM  12/29/2018  . COLONOSCOPY  08/05/2022  . INFLUENZA VACCINE  Completed  . DEXA SCAN  Completed  . Hepatitis C Screening  Completed  . HIV Screening  Completed    Cancer Screenings: Lung: Low Dose CT Chest recommended if Age 37-80 years, 30  pack-year currently smoking OR have quit w/in 15years. Patient does qualify. Breast:  Up to date on Mammogram? Yes   Up to date of Bone Density/Dexa? Yes Colorectal: yes  Additional Screenings: : Hepatitis C Screening: complete     Plan:  All health maintenance topics up to date. Patient due for DM check in November. No appts available with PCP, appt made with another red team physician. Patient wants PCP to know that Tramadol is taken for Piriformis Syndrome when stretching does not help and that Citalopram is not helping her anxiety like the Lorazepam did.    I have personally reviewed and noted the following in the patient's chart:   . Medical and social history . Use of alcohol, tobacco or illicit drugs  . Current medications and supplements . Functional  ability and status . Nutritional status . Physical activity . Advanced directives . List of other physicians . Hospitalizations, surgeries, and ER visits in previous 12 months . Vitals . Screenings to include cognitive, depression, and falls . Referrals and appointments  In addition, I have reviewed and discussed with patient certain preventive protocols, quality metrics, and best practice recommendations. A written personalized care plan for preventive services as well as general preventive health recommendations were provided to patient.     Esau Grew, RN  11/04/2017

## 2017-11-04 NOTE — Telephone Encounter (Signed)
Patient is on gabapentin. If her buttocks pain is worse, she can schedule a follow-up. Please see recent note from [facility].  [name], [place], PGY-3

## 2017-11-04 NOTE — Telephone Encounter (Signed)
I am aware that patient was trying to fill prescription on 02/05/2017 for prescription written on 11/27/2016. Will fill prescription till patient can follow-up with me. Please advise. Thank you.  [name], [place], PGY-3

## 2017-11-04 NOTE — Telephone Encounter (Signed)
Spoke with patient during AWV regarding request for Tramadol. States she occasionally takes this medication for piriformis syndrome when pain is not controlled by stretches that chiropractor has advised her to do. Does not take regularly. No appts available with PCP until December. Made DM f/u with another red team member in November. Patient would like, if possible, some Tramadol until she can discuss with physician at November appt.  Danley Danker, RN [facility] RN)

## 2017-11-04 NOTE — Patient Instructions (Addendum)
[name] , Thank you for taking time to come for your Medicare Wellness Visit. I appreciate your ongoing commitment to your health goals. Please review the following plan we discussed and let me know if I can assist you in the future.   These are the goals we discussed: Goals    . Blood Pressure < 140/90    . HEMOGLOBIN A1C < 7.0       This is a list of the screening recommended for you and due dates:  Health Maintenance  Topic Date Due  . Complete foot exam   11/27/2017  . Eye exam for diabetics  02/19/2018  . Hemoglobin A1C  03/03/2018  . Tetanus Vaccine  03/16/2018  . Pneumonia vaccines (2 of 2 - PPSV23) 10/19/2018  . Mammogram  12/29/2018  . Colon Cancer Screening  08/05/2022  . Flu Shot  Completed  . DEXA scan (bone density measurement)  Completed  .  Hepatitis C: One time screening is recommended by Center for Disease Control  (CDC) for  adults born from [birth year] through 1963.   Completed  . HIV Screening  Completed   Fall Prevention in the Home Falls can cause injuries. They can happen to people of all ages. There are many things you can do to make your home safe and to help prevent falls. What can I do on the outside of my home?  Regularly fix the edges of walkways and driveways and fix any cracks.  Remove anything that might make you trip as you walk through a door, such as a raised step or threshold.  Trim any bushes or trees on the path to your home.  Use bright outdoor lighting.  Clear any walking paths of anything that might make someone trip, such as rocks or tools.  Regularly check to see if handrails are loose or broken. Make sure that both sides of any steps have handrails.  Any raised decks and porches should have guardrails on the edges.  Have any leaves, snow, or ice cleared regularly.  Use sand or salt on walking paths during winter.  Clean up any spills in your garage right away. This includes oil or grease spills. What can I do in the  bathroom?  Use night lights.  Install grab bars by the toilet and in the tub and shower. Do not use towel bars as grab bars.  Use non-skid mats or decals in the tub or shower.  If you need to sit down in the shower, use a plastic, non-slip stool.  Keep the floor dry. Clean up any water that spills on the floor as soon as it happens.  Remove soap buildup in the tub or shower regularly.  Attach bath mats securely with double-sided non-slip rug tape.  Do not have throw rugs and other things on the floor that can make you trip. What can I do in the bedroom?  Use night lights.  Make sure that you have a light by your bed that is easy to reach.  Do not use any sheets or blankets that are too big for your bed. They should not hang down onto the floor.  Have a firm chair that has side arms. You can use this for support while you get dressed.  Do not have throw rugs and other things on the floor that can make you trip. What can I do in the kitchen?  Clean up any spills right away.  Avoid walking on wet floors.  Keep items that  you use a lot in easy-to-reach places.  If you need to reach something above you, use a strong step stool that has a grab bar.  Keep electrical cords out of the way.  Do not use floor polish or wax that makes floors slippery. If you must use wax, use non-skid floor wax.  Do not have throw rugs and other things on the floor that can make you trip. What can I do with my stairs?  Do not leave any items on the stairs.  Make sure that there are handrails on both sides of the stairs and use them. Fix handrails that are broken or loose. Make sure that handrails are as long as the stairways.  Check any carpeting to make sure that it is firmly attached to the stairs. Fix any carpet that is loose or worn.  Avoid having throw rugs at the top or bottom of the stairs. If you do have throw rugs, attach them to the floor with carpet tape.  Make sure that you have a  light switch at the top of the stairs and the bottom of the stairs. If you do not have them, ask someone to add them for you. What else can I do to help prevent falls?  Wear shoes that: ? Do not have high heels. ? Have rubber bottoms. ? Are comfortable and fit you well. ? Are closed at the toe. Do not wear sandals.  If you use a stepladder: ? Make sure that it is fully opened. Do not climb a closed stepladder. ? Make sure that both sides of the stepladder are locked into place. ? Ask someone to hold it for you, if possible.  Clearly mark and make sure that you can see: ? Any grab bars or handrails. ? First and last steps. ? Where the edge of each step is.  Use tools that help you move around (mobility aids) if they are needed. These include: ? Canes. ? Walkers. ? Scooters. ? Crutches.  Turn on the lights when you go into a dark area. Replace any light bulbs as soon as they burn out.  Set up your furniture so you have a clear path. Avoid moving your furniture around.  If any of your floors are uneven, fix them.  If there are any pets around you, be aware of where they are.  Review your medicines with your doctor. Some medicines can make you feel dizzy. This can increase your chance of falling. Ask your doctor what other things that you can do to help prevent falls. This information is not intended to replace advice given to you by your health care provider. Make sure you discuss any questions you have with your health care provider. Document Released: 10/25/2008 Document Revised: 06/06/2015 Document Reviewed: 02/02/2014 Elsevier Interactive Patient Education  [employer].

## 2017-11-04 NOTE — Telephone Encounter (Signed)
Called patient to inform her of making an appointment concerning her request for Tramadol.  Patient wants appointment in November with Dr. Yisroel Ramming but he is not available.  Will follow up with patient as she has requested to speak with me at [place] today for Medicare Wellness at 1500 with Alisa.  Ozella Almond, [place]

## 2017-11-05 DIAGNOSIS — J3089 Other allergic rhinitis: Secondary | ICD-10-CM | POA: Diagnosis not present

## 2017-11-05 DIAGNOSIS — J301 Allergic rhinitis due to pollen: Secondary | ICD-10-CM | POA: Diagnosis not present

## 2017-11-10 DIAGNOSIS — J301 Allergic rhinitis due to pollen: Secondary | ICD-10-CM | POA: Diagnosis not present

## 2017-11-10 DIAGNOSIS — E119 Type 2 diabetes mellitus without complications: Secondary | ICD-10-CM | POA: Diagnosis not present

## 2017-11-10 DIAGNOSIS — J3089 Other allergic rhinitis: Secondary | ICD-10-CM | POA: Diagnosis not present

## 2017-11-19 DIAGNOSIS — J3089 Other allergic rhinitis: Secondary | ICD-10-CM | POA: Diagnosis not present

## 2017-11-19 DIAGNOSIS — J301 Allergic rhinitis due to pollen: Secondary | ICD-10-CM | POA: Diagnosis not present

## 2017-11-26 DIAGNOSIS — J3089 Other allergic rhinitis: Secondary | ICD-10-CM | POA: Diagnosis not present

## 2017-11-26 DIAGNOSIS — J301 Allergic rhinitis due to pollen: Secondary | ICD-10-CM | POA: Diagnosis not present

## 2017-11-29 ENCOUNTER — Ambulatory Visit (INDEPENDENT_AMBULATORY_CARE_PROVIDER_SITE_OTHER): Payer: Medicare Other | Admitting: Family Medicine

## 2017-11-29 VITALS — BP 122/70 | HR 71 | Temp 99.5°F | Wt 166.8 lb

## 2017-11-29 DIAGNOSIS — G8929 Other chronic pain: Secondary | ICD-10-CM

## 2017-11-29 DIAGNOSIS — F4310 Post-traumatic stress disorder, unspecified: Secondary | ICD-10-CM

## 2017-11-29 DIAGNOSIS — M5440 Lumbago with sciatica, unspecified side: Secondary | ICD-10-CM

## 2017-11-29 DIAGNOSIS — F411 Generalized anxiety disorder: Secondary | ICD-10-CM | POA: Diagnosis not present

## 2017-11-29 DIAGNOSIS — E0840 Diabetes mellitus due to underlying condition with diabetic neuropathy, unspecified: Secondary | ICD-10-CM | POA: Diagnosis not present

## 2017-11-29 LAB — POCT GLYCOSYLATED HEMOGLOBIN (HGB A1C): HBA1C, POC (CONTROLLED DIABETIC RANGE): 7.5 % — AB (ref 0.0–7.0)

## 2017-11-29 MED ORDER — TRAMADOL HCL 50 MG PO TABS: 50.0000 mg | ORAL_TABLET | Freq: Two times a day (BID) | ORAL | 1 refills | Status: DC | PRN

## 2017-11-29 MED ORDER — CITALOPRAM HYDROBROMIDE 20 MG PO TABS: 20.0000 mg | ORAL_TABLET | Freq: Every day | ORAL | 0 refills | Status: DC

## 2017-11-29 NOTE — Patient Instructions (Signed)
Refilled your celexa  Diabetes looks great  Stay on same medicines  See Dr. Yisroel Ramming in 3 months  Be well, Dr. Ardelia Mems

## 2017-11-29 NOTE — Progress Notes (Signed)
Date of Visit: 11/29/2017   HPI:  Patient presents for diabetes and tramadol refill.  Diabetes -  Checks sugars 2-3 times per day. Fasting sugars in the morning are 90-145. Sugars generally stay under 200. Current regimen is glipizide 10mg  twice daily and metformin 1000mg  twice daily. Doing well with this regimen. Denies chest pain or shortness of breath.  Piriformis syndrome - takes tramadol for this. Takes 1-2 times per day depending on how bad the pain is. Sees chiropractor and has also gotten some exercises to do from them. Not interested in physical therapy at this time as she is busy taking care of her husband who is chronically ill. Needs refill of tramadol. No side effects from this medication. Symptoms are well controlled on the tramadol. Also takes acetaminophen.  PTSD - no longer on lorazepam. Taking celexa 20mg  daily and is doing well on this medication. Denies thoughts of harming self or others. She is happy to continue on the celexa.  ROS: See HPI.  [place]: history of type 2 diabetes, PTSD, hypertension, aortic atherosclerosis, tobacco abuse, overweight, allergic rhinitis, asthma, hepatic steatosis  PHYSICAL EXAM: BP 122/70   Pulse 71   Temp 99.5 F (37.5 C) (Oral)   Wt 166 lb 12.8 oz (75.7 kg)   SpO2 95%   BMI 27.33 kg/m  Gen: no acute distress, pleasant, cooperative HEENT: normocephalic, atraumatic, moist mucous membranes  Heart: regular rate and rhythm, no murmur Lungs: clear to auscultation bilaterally, normal work of breathing  Neuro: alert, grossly nonfocal, speech normal Ext: No appreciable lower extremity edema bilaterally  Diabetic foot exam: 2+ DP pulses bilat, decreased sensation to monofilament testing bilaterally. No lesions or significant calluses.   ASSESSMENT/PLAN:  Health maintenance:  -foot exam done today -otherwise UTD on HM items  Controlled diabetes mellitus with neurologic complication, without long-term current use of insulin (HCC) Well  controlled with A1c of 7.5. Continue glipizide & metformin. Follow up with PCP in 3 months. Advised to examine feet daily due to decreased sensation with monofilament testing.  PTSD (post-traumatic stress disorder) Doing well on celexa, off lorazepam. Congratulated her on coming off of long-term benzodiazepine and reiterated this was a good choice. Refilled celexa. Follow up with PCP in 3 months.   Chronic low back pain Refilled tramadol for what patient reports is piriformis syndrome. Well controlled on this medication. [place] PMP reviewed with appropriate findings.  FOLLOW UP: Follow up in 3 months with PCP for above issues  [place] J. Ardelia Mems, [place]

## 2017-11-29 NOTE — Assessment & Plan Note (Signed)
Doing well on celexa, off lorazepam. Congratulated her on coming off of long-term benzodiazepine and reiterated this was a good choice. Refilled celexa. Follow up with PCP in 3 months.

## 2017-11-29 NOTE — Assessment & Plan Note (Signed)
Refilled tramadol for what patient reports is piriformis syndrome. Well controlled on this medication. [place] PMP reviewed with appropriate findings.

## 2017-11-29 NOTE — Assessment & Plan Note (Addendum)
Well controlled with A1c of 7.5. Continue glipizide & metformin. Follow up with PCP in 3 months. Advised to examine feet daily due to decreased sensation with monofilament testing.

## 2017-12-03 DIAGNOSIS — J3089 Other allergic rhinitis: Secondary | ICD-10-CM | POA: Diagnosis not present

## 2017-12-03 DIAGNOSIS — J301 Allergic rhinitis due to pollen: Secondary | ICD-10-CM | POA: Diagnosis not present

## 2017-12-06 DIAGNOSIS — J3089 Other allergic rhinitis: Secondary | ICD-10-CM | POA: Diagnosis not present

## 2017-12-06 DIAGNOSIS — J301 Allergic rhinitis due to pollen: Secondary | ICD-10-CM | POA: Diagnosis not present

## 2017-12-17 DIAGNOSIS — J3089 Other allergic rhinitis: Secondary | ICD-10-CM | POA: Diagnosis not present

## 2017-12-17 DIAGNOSIS — J301 Allergic rhinitis due to pollen: Secondary | ICD-10-CM | POA: Diagnosis not present

## 2017-12-23 DIAGNOSIS — J3089 Other allergic rhinitis: Secondary | ICD-10-CM | POA: Diagnosis not present

## 2017-12-23 DIAGNOSIS — J301 Allergic rhinitis due to pollen: Secondary | ICD-10-CM | POA: Diagnosis not present

## 2017-12-29 DIAGNOSIS — Z1231 Encounter for screening mammogram for malignant neoplasm of breast: Secondary | ICD-10-CM | POA: Diagnosis not present

## 2017-12-31 DIAGNOSIS — J301 Allergic rhinitis due to pollen: Secondary | ICD-10-CM | POA: Diagnosis not present

## 2017-12-31 DIAGNOSIS — J3089 Other allergic rhinitis: Secondary | ICD-10-CM | POA: Diagnosis not present

## 2018-01-07 DIAGNOSIS — J301 Allergic rhinitis due to pollen: Secondary | ICD-10-CM | POA: Diagnosis not present

## 2018-01-07 DIAGNOSIS — J3089 Other allergic rhinitis: Secondary | ICD-10-CM | POA: Diagnosis not present

## 2018-01-13 DIAGNOSIS — J3089 Other allergic rhinitis: Secondary | ICD-10-CM | POA: Diagnosis not present

## 2018-01-13 DIAGNOSIS — J301 Allergic rhinitis due to pollen: Secondary | ICD-10-CM | POA: Diagnosis not present

## 2018-01-14 DIAGNOSIS — J3089 Other allergic rhinitis: Secondary | ICD-10-CM | POA: Diagnosis not present

## 2018-01-14 DIAGNOSIS — J301 Allergic rhinitis due to pollen: Secondary | ICD-10-CM | POA: Diagnosis not present

## 2018-01-18 ENCOUNTER — Other Ambulatory Visit: Payer: Self-pay | Admitting: Family Medicine

## 2018-01-18 DIAGNOSIS — M5440 Lumbago with sciatica, unspecified side: Principal | ICD-10-CM

## 2018-01-18 DIAGNOSIS — G8929 Other chronic pain: Secondary | ICD-10-CM

## 2018-01-20 ENCOUNTER — Encounter: Payer: Self-pay | Admitting: Family Medicine

## 2018-01-20 DIAGNOSIS — J301 Allergic rhinitis due to pollen: Secondary | ICD-10-CM | POA: Diagnosis not present

## 2018-01-20 DIAGNOSIS — J3089 Other allergic rhinitis: Secondary | ICD-10-CM | POA: Diagnosis not present

## 2018-01-28 DIAGNOSIS — J3089 Other allergic rhinitis: Secondary | ICD-10-CM | POA: Diagnosis not present

## 2018-01-28 DIAGNOSIS — J301 Allergic rhinitis due to pollen: Secondary | ICD-10-CM | POA: Diagnosis not present

## 2018-01-31 ENCOUNTER — Telehealth: Payer: Self-pay

## 2018-01-31 NOTE — Telephone Encounter (Signed)
Fax from pharmacy that Losartan on backorder. Alternative needed.  Danley Danker, RN [facility] RN)

## 2018-02-01 ENCOUNTER — Other Ambulatory Visit: Payer: Self-pay | Admitting: Family Medicine

## 2018-02-01 MED ORDER — VALSARTAN 160 MG PO TABS: 160.0000 mg | ORAL_TABLET | Freq: Every day | ORAL | 0 refills | Status: DC

## 2018-02-01 NOTE — Telephone Encounter (Signed)
Sent in Valsartan 160 mg daily. Please advise.

## 2018-02-01 NOTE — Telephone Encounter (Signed)
Called and LVM informing patient of new RX.  Marland KitchenOzella Almond, CMA

## 2018-02-02 ENCOUNTER — Ambulatory Visit: Payer: Medicare Other | Admitting: Podiatry

## 2018-02-03 DIAGNOSIS — J301 Allergic rhinitis due to pollen: Secondary | ICD-10-CM | POA: Diagnosis not present

## 2018-02-03 DIAGNOSIS — J3089 Other allergic rhinitis: Secondary | ICD-10-CM | POA: Diagnosis not present

## 2018-02-05 ENCOUNTER — Other Ambulatory Visit: Payer: Self-pay | Admitting: Family Medicine

## 2018-02-05 DIAGNOSIS — F411 Generalized anxiety disorder: Secondary | ICD-10-CM

## 2018-02-09 ENCOUNTER — Encounter: Payer: Self-pay | Admitting: Family Medicine

## 2018-02-09 ENCOUNTER — Ambulatory Visit (INDEPENDENT_AMBULATORY_CARE_PROVIDER_SITE_OTHER): Payer: Medicare Other | Admitting: Family Medicine

## 2018-02-09 ENCOUNTER — Ambulatory Visit: Payer: Medicare Other | Admitting: Podiatry

## 2018-02-09 ENCOUNTER — Encounter: Payer: Self-pay | Admitting: Podiatry

## 2018-02-09 VITALS — BP 115/70 | HR 83 | Temp 98.3°F | Wt 167.0 lb

## 2018-02-09 DIAGNOSIS — E1140 Type 2 diabetes mellitus with diabetic neuropathy, unspecified: Secondary | ICD-10-CM | POA: Diagnosis not present

## 2018-02-09 DIAGNOSIS — K219 Gastro-esophageal reflux disease without esophagitis: Secondary | ICD-10-CM | POA: Diagnosis not present

## 2018-02-09 DIAGNOSIS — M2010 Hallux valgus (acquired), unspecified foot: Secondary | ICD-10-CM

## 2018-02-09 DIAGNOSIS — E0840 Diabetes mellitus due to underlying condition with diabetic neuropathy, unspecified: Secondary | ICD-10-CM | POA: Diagnosis not present

## 2018-02-09 DIAGNOSIS — F17200 Nicotine dependence, unspecified, uncomplicated: Secondary | ICD-10-CM | POA: Diagnosis not present

## 2018-02-09 DIAGNOSIS — J301 Allergic rhinitis due to pollen: Secondary | ICD-10-CM | POA: Diagnosis not present

## 2018-02-09 DIAGNOSIS — B351 Tinea unguium: Secondary | ICD-10-CM | POA: Diagnosis not present

## 2018-02-09 DIAGNOSIS — J3089 Other allergic rhinitis: Secondary | ICD-10-CM | POA: Diagnosis not present

## 2018-02-09 LAB — POCT GLYCOSYLATED HEMOGLOBIN (HGB A1C): HbA1c, POC (controlled diabetic range): 7.5 % — AB (ref 0.0–7.0)

## 2018-02-09 NOTE — Progress Notes (Signed)
Patient ID: [name], female   DOB: Aug 04, 1950, 68 y.o.   MRN: [id number] Complaint:  Visit Type: Patient returns to my office for continued preventative foot care services. Complaint: Patient states" my nails have grown long and thick and become painful to walk and wear shoes" Patient has been diagnosed with DM with neuropathy.. The patient presents for preventative foot care services. No changes to ROS.   Podiatric Exam: Vascular: dorsalis pedis and posterior tibial pulses are palpable bilateral. Capillary return is immediate. Temperature gradient is WNL. Skin turgor WNL  Sensorium: Diminished  Semmes Weinstein monofilament test. Normal tactile sensation bilaterally. Nail Exam: Pt has thick disfigured discolored nails with subungual debris noted bilateral entire nail hallux through fifth toenails Ulcer Exam: There is no evidence of ulcer or pre-ulcerative changes or infection. Orthopedic Exam: Muscle tone and strength are WNL. No limitations in general ROM. No crepitus or effusions noted. Foot type and digits show no abnormalities. HAV right and left. Skin: No Porokeratosis. No infection or ulcers  Diagnosis:  Onychomycosis, , Pain in right toe, pain in left toe  Treatment & Plan Procedures and Treatment: Consent by patient was obtained for treatment procedures. The patient understood the discussion of treatment and procedures well. All questions were answered thoroughly reviewed. Debridement of mycotic and hypertrophic toenails, 1 through 5 bilateral and clearing of subungual debris. No ulceration, no infection noted.  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 Return Visit-Office Procedure: Patient instructed to return to the office for a follow up visit 3 months   for continued evaluation and treatment.        Gardiner Barefoot DPM

## 2018-02-09 NOTE — Progress Notes (Signed)
   Subjective   Patient ID: [name]    DOB: 02-17-1950, 68 y.o. female   MRN: [id number]  CC: "Follow-up"  HPI: [name] is a 68 y.o. female who presents to clinic today for the following:  Diabetes: Patient with longstanding controlled diabetes without insulin use.  She is tolerating her glipizide and metformin.  CBGs average 150s.  She has no history of lows but would like to continue to check her glucose levels.  She is followed by an ophthalmologist annually.  She recently received her diabetic foot exam 2 months ago.  She makes efforts to avoid excessive carbohydrate intake.  Tobacco use disorder: Patient continues to smoke daily.  She is not interested in cessation today because she is "not mentally ready."  She denies fevers or chills, cough, shortness of breath, chest pain, unexplained weight loss.  GERD: Patient continues to take Protonix daily.  She does not have any recent episodes of reflux.  She denies any nausea or vomiting or diarrhea.  She is up-to-date on her colonoscopy.  ROS: see HPI for pertinent.  [place]: DM with neuropathy, HLD, dysphagia, HTN, GERD,PAF (s/p ablation),tobacco use disorder.Surgical history appendectomy, TAH, dental extraction. Family history HTN, stroke, DM, stomach cancer (mother). Smoking status reviewed. Medications reviewed.  Objective   BP 115/70   Pulse 83   Temp 98.3 F (36.8 C) (Oral)   Wt 167 lb (75.8 kg)   SpO2 98%   BMI 27.37 kg/m  Vitals and nursing note reviewed.  General: well nourished, well developed, NAD with non-toxic appearance HEENT: normocephalic, atraumatic, moist mucous membranes Neck: supple, non-tender without lymphadenopathy Cardiovascular: regular rate and rhythm without murmurs, rubs, or gallops Lungs: clear to auscultation bilaterally with normal work of breathing Abdomen: soft, non-tender, non-distended, normoactive bowel sounds Skin: warm, dry, no rashes or lesions, cap refill < 2  seconds Extremities: warm and well perfused, normal tone, no edema  Assessment & Plan   Tobacco use disorder Chronic.  Not interested in cessation today. - Discussed importance of cessation  GERD Chronic.  Well-controlled.  Discussed weaning off of Protonix and discontinuing daily regimen. - Patient plans to wean off Protonix every other day for the next week and will discontinue and use on a as needed basis  Controlled diabetes mellitus with neurologic complication, without long-term current use of insulin (HCC) Chronic.  Well-controlled, A1c 7.5 unchanged from prior 2 months ago.  She has no known retinopathy or nephropathy. - Continue glipizide 10 mg twice daily and metformin 1000 mg twice daily - RTC 3 months for next A1c check  Orders Placed This Encounter  Procedures  . HgB A1c   No orders of the defined types were placed in this encounter.   [name], [place], PGY-3 02/09/2018, 2:12 PM

## 2018-02-09 NOTE — Patient Instructions (Signed)
Thank you for coming in to see [place] today. Please see below to review our plan for today's visit.  1.  I removed losartan from your medication list to avoid confusion.  You will take the valsartan for now for your blood pressure since losartan is on back order. 2.  I will make it a goal to make sure your future refills are 90-day supply.  You can specifically request this if you end up calling our clinic or your local pharmacy. 3.  I would like you to wean off of the Protonix by taking it every other day for the next week and then discontinue altogether.  This can be a safe medication but should only be used when you have episodes of reflux.  Please call the clinic at [phone] if your symptoms worsen or you have any concerns. It was our pleasure to serve you.  [name], [place], PGY-3

## 2018-02-09 NOTE — Assessment & Plan Note (Signed)
Chronic.  Well-controlled, A1c 7.5 unchanged from prior 2 months ago.  She has no known retinopathy or nephropathy. - Continue glipizide 10 mg twice daily and metformin 1000 mg twice daily - RTC 3 months for next A1c check

## 2018-02-09 NOTE — Assessment & Plan Note (Signed)
Chronic.  Not interested in cessation today. - Discussed importance of cessation

## 2018-02-09 NOTE — Assessment & Plan Note (Signed)
Chronic.  Well-controlled.  Discussed weaning off of Protonix and discontinuing daily regimen. - Patient plans to wean off Protonix every other day for the next week and will discontinue and use on a as needed basis

## 2018-02-14 ENCOUNTER — Ambulatory Visit: Payer: Medicare Other | Admitting: Orthotics

## 2018-02-14 DIAGNOSIS — L851 Acquired keratosis [keratoderma] palmaris et plantaris: Secondary | ICD-10-CM

## 2018-02-14 DIAGNOSIS — E1140 Type 2 diabetes mellitus with diabetic neuropathy, unspecified: Secondary | ICD-10-CM

## 2018-02-14 DIAGNOSIS — M2010 Hallux valgus (acquired), unspecified foot: Secondary | ICD-10-CM

## 2018-02-14 NOTE — Progress Notes (Signed)

## 2018-02-15 ENCOUNTER — Other Ambulatory Visit: Payer: Self-pay | Admitting: Family Medicine

## 2018-02-15 ENCOUNTER — Other Ambulatory Visit: Payer: Self-pay

## 2018-02-15 DIAGNOSIS — G8929 Other chronic pain: Secondary | ICD-10-CM

## 2018-02-15 DIAGNOSIS — M5440 Lumbago with sciatica, unspecified side: Principal | ICD-10-CM

## 2018-02-16 ENCOUNTER — Other Ambulatory Visit: Payer: Self-pay | Admitting: Family Medicine

## 2018-02-16 DIAGNOSIS — J3089 Other allergic rhinitis: Secondary | ICD-10-CM | POA: Diagnosis not present

## 2018-02-16 DIAGNOSIS — M5440 Lumbago with sciatica, unspecified side: Principal | ICD-10-CM

## 2018-02-16 DIAGNOSIS — J301 Allergic rhinitis due to pollen: Secondary | ICD-10-CM | POA: Diagnosis not present

## 2018-02-16 DIAGNOSIS — G8929 Other chronic pain: Secondary | ICD-10-CM

## 2018-02-16 NOTE — Telephone Encounter (Signed)
Patient was last seen in November by Dr. Ardelia Mems and was instructed to follow-up in 3 months with PCP.  Will only fill 30-day supply at this time.  Patient will need to follow-up with me for additional supply.  [name], [place], PGY-3

## 2018-02-17 ENCOUNTER — Other Ambulatory Visit: Payer: Self-pay | Admitting: Family Medicine

## 2018-02-17 MED ORDER — TRAMADOL HCL 50 MG PO TABS: 50.0000 mg | ORAL_TABLET | Freq: Every day | ORAL | 0 refills | Status: DC | PRN

## 2018-02-17 NOTE — Telephone Encounter (Signed)
Informed  patient  that Dr. Yisroel Ramming sent her 5 Tramadol to [place] on file.  This should last until 02/22/2018 when her regular RX is filled.    [name], [place]

## 2018-02-17 NOTE — Telephone Encounter (Signed)
Patient was her on 02/09/2018 and saw Dr. Yisroel Ramming and got an A1C done.  Called patient and she says that the few tramadol that she has left will not last until 02/28/2018 (which is when [facility] says it can be refilled).  Patient would like to know why she needs another follow up appointment before she can get her 90 day supply?  Ozella Almond, CMA   .Marland Kitchen

## 2018-02-22 DIAGNOSIS — J301 Allergic rhinitis due to pollen: Secondary | ICD-10-CM | POA: Diagnosis not present

## 2018-02-22 DIAGNOSIS — J3089 Other allergic rhinitis: Secondary | ICD-10-CM | POA: Diagnosis not present

## 2018-02-25 ENCOUNTER — Other Ambulatory Visit: Payer: Self-pay | Admitting: Internal Medicine

## 2018-02-25 DIAGNOSIS — E0840 Diabetes mellitus due to underlying condition with diabetic neuropathy, unspecified: Secondary | ICD-10-CM

## 2018-02-25 DIAGNOSIS — J3089 Other allergic rhinitis: Secondary | ICD-10-CM | POA: Diagnosis not present

## 2018-02-25 DIAGNOSIS — J301 Allergic rhinitis due to pollen: Secondary | ICD-10-CM | POA: Diagnosis not present

## 2018-03-01 ENCOUNTER — Other Ambulatory Visit: Payer: Self-pay | Admitting: Internal Medicine

## 2018-03-01 DIAGNOSIS — E0840 Diabetes mellitus due to underlying condition with diabetic neuropathy, unspecified: Secondary | ICD-10-CM

## 2018-03-02 DIAGNOSIS — J3089 Other allergic rhinitis: Secondary | ICD-10-CM | POA: Diagnosis not present

## 2018-03-02 DIAGNOSIS — J301 Allergic rhinitis due to pollen: Secondary | ICD-10-CM | POA: Diagnosis not present

## 2018-03-02 MED ORDER — ONETOUCH ULTRASOFT LANCETS MISC: 5 refills | Status: DC

## 2018-03-02 NOTE — Addendum Note (Signed)
Addended by: Christen Bame D on: 03/02/2018 12:23 PM   Modules accepted: Orders

## 2018-03-02 NOTE — Telephone Encounter (Signed)
Resent under P[employer] certified provider. [place], Salome Spotted, CMA

## 2018-03-04 ENCOUNTER — Other Ambulatory Visit: Payer: Self-pay | Admitting: Family Medicine

## 2018-03-04 DIAGNOSIS — J301 Allergic rhinitis due to pollen: Secondary | ICD-10-CM | POA: Diagnosis not present

## 2018-03-04 DIAGNOSIS — J3089 Other allergic rhinitis: Secondary | ICD-10-CM | POA: Diagnosis not present

## 2018-03-09 DIAGNOSIS — J301 Allergic rhinitis due to pollen: Secondary | ICD-10-CM | POA: Diagnosis not present

## 2018-03-09 DIAGNOSIS — J3089 Other allergic rhinitis: Secondary | ICD-10-CM | POA: Diagnosis not present

## 2018-03-16 DIAGNOSIS — H1045 Other chronic allergic conjunctivitis: Secondary | ICD-10-CM | POA: Diagnosis not present

## 2018-03-16 DIAGNOSIS — J3089 Other allergic rhinitis: Secondary | ICD-10-CM | POA: Diagnosis not present

## 2018-03-16 DIAGNOSIS — J4520 Mild intermittent asthma, uncomplicated: Secondary | ICD-10-CM | POA: Diagnosis not present

## 2018-03-16 DIAGNOSIS — K219 Gastro-esophageal reflux disease without esophagitis: Secondary | ICD-10-CM | POA: Diagnosis not present

## 2018-03-18 ENCOUNTER — Ambulatory Visit: Payer: Medicare Other | Admitting: Orthotics

## 2018-03-19 ENCOUNTER — Other Ambulatory Visit: Payer: Self-pay | Admitting: Family Medicine

## 2018-03-30 ENCOUNTER — Other Ambulatory Visit: Payer: Self-pay

## 2018-03-30 ENCOUNTER — Ambulatory Visit (INDEPENDENT_AMBULATORY_CARE_PROVIDER_SITE_OTHER): Payer: Medicare Other | Admitting: Orthotics

## 2018-03-30 DIAGNOSIS — M2012 Hallux valgus (acquired), left foot: Secondary | ICD-10-CM

## 2018-03-30 DIAGNOSIS — M2011 Hallux valgus (acquired), right foot: Secondary | ICD-10-CM | POA: Diagnosis not present

## 2018-03-30 DIAGNOSIS — E1140 Type 2 diabetes mellitus with diabetic neuropathy, unspecified: Secondary | ICD-10-CM

## 2018-03-30 DIAGNOSIS — M2010 Hallux valgus (acquired), unspecified foot: Secondary | ICD-10-CM

## 2018-03-30 DIAGNOSIS — L851 Acquired keratosis [keratoderma] palmaris et plantaris: Secondary | ICD-10-CM | POA: Diagnosis not present

## 2018-03-30 NOTE — Progress Notes (Signed)

## 2018-04-01 ENCOUNTER — Telehealth: Payer: Self-pay | Admitting: Family Medicine

## 2018-04-01 NOTE — Telephone Encounter (Signed)
Patient does not need refills. Offered to f/u in May given decent control of A1c. Patient in agreement. She will call back in April.  [name], [place], PGY-3

## 2018-04-01 NOTE — Telephone Encounter (Signed)
Pt is calling to schedule her a1c check up. Please call pt to let her know if this will be an appointment that is able to be done over the phone or if she still needs to come in. She said she is due for her next check up in April.

## 2018-04-01 NOTE — Telephone Encounter (Signed)
Returned call to pt requesting appt for routine DM. Patient does not need refills. Offered to f/u in May given decent control of A1c in the setting of COVID outbreak. Patient in agreement. She will call back in April.  [name], [place], PGY-3

## 2018-04-04 ENCOUNTER — Other Ambulatory Visit: Payer: Self-pay | Admitting: Family Medicine

## 2018-04-04 DIAGNOSIS — G8929 Other chronic pain: Secondary | ICD-10-CM

## 2018-04-04 DIAGNOSIS — M5440 Lumbago with sciatica, unspecified side: Principal | ICD-10-CM

## 2018-04-04 MED ORDER — TRAMADOL HCL 50 MG PO TABS: 50.0000 mg | ORAL_TABLET | Freq: Every day | ORAL | 0 refills | Status: DC | PRN

## 2018-04-13 DIAGNOSIS — J3089 Other allergic rhinitis: Secondary | ICD-10-CM | POA: Diagnosis not present

## 2018-04-13 DIAGNOSIS — J301 Allergic rhinitis due to pollen: Secondary | ICD-10-CM | POA: Diagnosis not present

## 2018-04-20 DIAGNOSIS — J301 Allergic rhinitis due to pollen: Secondary | ICD-10-CM | POA: Diagnosis not present

## 2018-04-20 DIAGNOSIS — J3089 Other allergic rhinitis: Secondary | ICD-10-CM | POA: Diagnosis not present

## 2018-04-26 ENCOUNTER — Other Ambulatory Visit: Payer: Self-pay | Admitting: Internal Medicine

## 2018-04-29 DIAGNOSIS — J301 Allergic rhinitis due to pollen: Secondary | ICD-10-CM | POA: Diagnosis not present

## 2018-04-29 DIAGNOSIS — J3089 Other allergic rhinitis: Secondary | ICD-10-CM | POA: Diagnosis not present

## 2018-05-02 ENCOUNTER — Other Ambulatory Visit: Payer: Self-pay | Admitting: Family Medicine

## 2018-05-02 ENCOUNTER — Other Ambulatory Visit: Payer: Self-pay | Admitting: *Deleted

## 2018-05-02 DIAGNOSIS — G8929 Other chronic pain: Secondary | ICD-10-CM

## 2018-05-02 DIAGNOSIS — M5440 Lumbago with sciatica, unspecified side: Principal | ICD-10-CM

## 2018-05-02 NOTE — Telephone Encounter (Signed)
Rx already sent. Last refill filled 30 days prior to 05/03/18. Can fill 05/04/2018.  [name], [place], PGY-3

## 2018-05-06 ENCOUNTER — Other Ambulatory Visit: Payer: Self-pay | Admitting: Family Medicine

## 2018-05-06 DIAGNOSIS — J301 Allergic rhinitis due to pollen: Secondary | ICD-10-CM | POA: Diagnosis not present

## 2018-05-06 DIAGNOSIS — J3089 Other allergic rhinitis: Secondary | ICD-10-CM | POA: Diagnosis not present

## 2018-05-10 ENCOUNTER — Other Ambulatory Visit: Payer: Medicare Other

## 2018-05-11 ENCOUNTER — Other Ambulatory Visit: Payer: Medicare Other | Admitting: Orthotics

## 2018-05-11 ENCOUNTER — Ambulatory Visit: Payer: Medicare Other | Admitting: Podiatry

## 2018-05-12 ENCOUNTER — Ambulatory Visit: Payer: Medicare Other | Admitting: Orthotics

## 2018-05-12 ENCOUNTER — Other Ambulatory Visit: Payer: Self-pay

## 2018-05-12 ENCOUNTER — Ambulatory Visit: Payer: Medicare Other | Admitting: Podiatry

## 2018-05-12 ENCOUNTER — Encounter: Payer: Self-pay | Admitting: Podiatry

## 2018-05-12 VITALS — Temp 97.7°F

## 2018-05-12 DIAGNOSIS — E1142 Type 2 diabetes mellitus with diabetic polyneuropathy: Secondary | ICD-10-CM

## 2018-05-12 DIAGNOSIS — L851 Acquired keratosis [keratoderma] palmaris et plantaris: Secondary | ICD-10-CM

## 2018-05-12 DIAGNOSIS — E1140 Type 2 diabetes mellitus with diabetic neuropathy, unspecified: Secondary | ICD-10-CM

## 2018-05-12 DIAGNOSIS — B351 Tinea unguium: Secondary | ICD-10-CM

## 2018-05-12 DIAGNOSIS — M2010 Hallux valgus (acquired), unspecified foot: Secondary | ICD-10-CM

## 2018-05-12 NOTE — Patient Instructions (Signed)
Diabetes Mellitus and Foot Care °Foot care is an important part of your health, especially when you have diabetes. Diabetes may cause you to have problems because of poor blood flow (circulation) to your feet and legs, which can cause your skin to: °· Become thinner and drier. °· Break more easily. °· Heal more slowly. °· Peel and crack. °You may also have nerve damage (neuropathy) in your legs and feet, causing decreased feeling in them. This means that you may not notice minor injuries to your feet that could lead to more serious problems. Noticing and addressing any potential problems early is the best way to prevent future foot problems. °How to care for your feet °Foot hygiene °· Wash your feet daily with warm water and mild soap. Do not use hot water. Then, pat your feet and the areas between your toes until they are completely dry. Do not soak your feet as this can dry your skin. °· Trim your toenails straight across. Do not dig under them or around the cuticle. File the edges of your nails with an emery board or nail file. °· Apply a moisturizing lotion or petroleum jelly to the skin on your feet and to dry, brittle toenails. Use lotion that does not contain alcohol and is unscented. Do not apply lotion between your toes. °Shoes and socks °· Wear clean socks or stockings every day. Make sure they are not too tight. Do not wear knee-high stockings since they may decrease blood flow to your legs. °· Wear shoes that fit properly and have enough cushioning. Always look in your shoes before you put them on to be sure there are no objects inside. °· To break in new shoes, wear them for just a few hours a day. This prevents injuries on your feet. °Wounds, scrapes, corns, and calluses °· Check your feet daily for blisters, cuts, bruises, sores, and redness. If you cannot see the bottom of your feet, use a mirror or ask someone for help. °· Do not cut corns or calluses or try to remove them with medicine. °· If you  find a minor scrape, cut, or break in the skin on your feet, keep it and the skin around it clean and dry. You may clean these areas with mild soap and water. Do not clean the area with peroxide, alcohol, or iodine. °· If you have a wound, scrape, corn, or callus on your foot, look at it several times a day to make sure it is healing and not infected. Check for: °? Redness, swelling, or pain. °? Fluid or blood. °? Warmth. °? Pus or a bad smell. °General instructions °· Do not cross your legs. This may decrease blood flow to your feet. °· Do not use heating pads or hot water bottles on your feet. They may burn your skin. If you have lost feeling in your feet or legs, you may not know this is happening until it is too late. °· Protect your feet from hot and cold by wearing shoes, such as at the beach or on hot pavement. °· Schedule a complete foot exam at least once a year (annually) or more often if you have foot problems. If you have foot problems, report any cuts, sores, or bruises to your health care provider immediately. °Contact a health care provider if: °· You have a medical condition that increases your risk of infection and you have any cuts, sores, or bruises on your feet. °· You have an injury that is not   healing. °· You have redness on your legs or feet. °· You feel burning or tingling in your legs or feet. °· You have pain or cramps in your legs and feet. °· Your legs or feet are numb. °· Your feet always feel cold. °· You have pain around a toenail. °Get help right away if: °· You have a wound, scrape, corn, or callus on your foot and: °? You have pain, swelling, or redness that gets worse. °? You have fluid or blood coming from the wound, scrape, corn, or callus. °? Your wound, scrape, corn, or callus feels warm to the touch. °? You have pus or a bad smell coming from the wound, scrape, corn, or callus. °? You have a fever. °? You have a red line going up your leg. °Summary °· Check your feet every day  for cuts, sores, red spots, swelling, and blisters. °· Moisturize feet and legs daily. °· Wear shoes that fit properly and have enough cushioning. °· If you have foot problems, report any cuts, sores, or bruises to your health care provider immediately. °· Schedule a complete foot exam at least once a year (annually) or more often if you have foot problems. °This information is not intended to replace advice given to you by your health care provider. Make sure you discuss any questions you have with your health care provider. °Document Released: 12/27/1999 Document Revised: 02/10/2017 Document Reviewed: 01/31/2016 °Elsevier Interactive Patient Education © 2019 [employer] ° °Diabetic Neuropathy °Diabetic neuropathy refers to nerve damage that is caused by diabetes (diabetes mellitus). Over time, people with diabetes can develop nerve damage throughout the body. There are several types of diabetic neuropathy: °· Peripheral neuropathy. This is the most common type of diabetic neuropathy. It causes damage to nerves that carry signals between the spinal cord and other parts of the body (peripheral nerves). This usually affects nerves in the feet and legs first, and may eventually affect the hands and arms. The damage affects the ability to sense touch or temperature. °· Autonomic neuropathy. This type causes damage to nerves that control involuntary functions (autonomic nerves). These nerves carry signals that control: °? Heartbeat. °? Body temperature. °? Blood pressure. °? Urination. °? Digestion. °? Sweating. °? Sexual function. °? Response to changing blood sugar (glucose) levels. °· Focal neuropathy. This type of nerve damage affects one area of the body, such as an arm, a leg, or the face. The injury may involve one nerve or a small group of nerves. Focal neuropathy can be painful and unpredictable, and occurs most often in older adults with diabetes. This often develops suddenly, but usually improves over time  and does not cause long-term problems. °· Proximal neuropathy. This type of nerve damage affects the nerves of the thighs, hips, buttocks, or legs. It causes severe pain, weakness, and muscle death (atrophy), usually in the thigh muscles. It is more common among older men and people who have type 2 diabetes. The length of recovery time may vary. °What are the causes? °Peripheral, autonomic, and focal neuropathies are caused by diabetes that is not well controlled with treatment. The cause of proximal neuropathy is not known, but it may be caused by inflammation related to uncontrolled blood glucose levels. °What are the signs or symptoms? °Peripheral neuropathy °Peripheral neuropathy develops slowly over time. When the nerves of the feet and legs no longer work, you may experience: °· Burning, stabbing, or aching pain in the legs or feet. °· Pain or cramping in the   legs or feet. °· Loss of feeling (numbness) and inability to feel pressure or pain in the feet. This can lead to: °? Thick calluses or sores on areas of constant pressure. °? Ulcers. °? Reduced ability to feel temperature changes. °· Foot deformities. °· Muscle weakness. °· Loss of balance or coordination. °Autonomic neuropathy °The symptoms of autonomic neuropathy vary depending on which nerves are affected. Symptoms may include: °· Problems with digestion, such as: °? Nausea or vomiting. °? Poor appetite. °? Bloating. °? Diarrhea or constipation. °? Trouble swallowing. °? Losing weight without trying to. °· Problems with the heart, blood and lungs, such as: °? Dizziness, especially when standing up. °? Fainting. °? Shortness of breath. °? Irregular heartbeat. °· Bladder problems, such as: °? Trouble starting or stopping urination. °? Leaking urine. °? Trouble emptying the bladder. °? Urinary tract infections (UTIs). °· Problems with other body functions, such as: °? Sweat. You may sweat too much or too little. °? Temperature. You might get hot easily.  Or, you might feel cold more than usual. °? Sexual function. Men may not be able to get or maintain an erection. Women may have vaginal dryness and difficulty with arousal. °Focal neuropathy °Symptoms affect only one area of the body. Common symptoms include: °· Numbness. °· Tingling. °· Burning pain. °· Prickling feeling. °· Very sensitive skin. °· Weakness. °· Inability to move (paralysis). °· Muscle twitching. °· Muscles getting smaller (wasting). °· Poor coordination. °· Double or blurred vision. °Proximal neuropathy °· Sudden, severe pain in the hip, thigh, or buttocks. Pain may spread from the back into the legs (sciatica). °· Pain and numbness in the arms and legs. °· Tingling. °· Loss of bladder control or bowel control. °· Weakness and wasting of thigh muscles. °· Difficulty getting up from a seated position. °· Abdominal swelling. °· Unexplained weight loss. °How is this diagnosed? °Diagnosis usually involves reviewing your medical history and any symptoms you have. Diagnosis varies depending on the type of neuropathy your health care provider suspects. °Peripheral neuropathy °Your health care provider will check areas that are affected by your nervous system (neurologic exam), such as your reflexes, how you move, and what you can feel. You may have other tests, such as: °· Blood tests. °· Removal and examination of fluid that surrounds the spinal cord (lumbar puncture). °· CT scan. °· MRI. °· A test to check the nerves that control muscles (electromyogram, EMG). °· Tests of how quickly messages pass through your nerves (nerve conduction velocity tests). °· Removal of a small piece of nerve to be examined under a microscope (biopsy). °Autonomic neuropathy °You may have tests, such as: °· Tests to measure your blood pressure and heart rate. This may include monitoring you while you are safely secured to an exam table that moves you from a lying position to an upright position (table tilt test). °· Breathing  tests to check your lungs. °· Tests to check how food moves through the digestive system (gastric emptying tests). °· Blood, sweat, or urine tests. °· Ultrasound of your bladder. °· Spinal fluid tests. °Focal neuropathy °This condition may be diagnosed with: °· A neurologic exam. °· CT scan. °· MRI. °· EMG. °· Nerve conduction velocity tests. °Proximal neuropathy °There is no test to diagnose this type of neuropathy. You may have tests to rule out other possible causes of this type of neuropathy. Tests may include: °· X-rays of your spine and lumbar region. °· Lumbar puncture. °· MRI. °How is this treated? °The   goal of treatment is to keep nerve damage from getting worse. The most important part of treatment is keeping your blood glucose level and your A1C level within your target range by following your diabetes management plan. Over time, maintaining lower blood glucose levels helps lessen symptoms. In some cases, you may need prescription pain medicine. °Follow these instructions at home: ° °Lifestyle ° °· Do not use any products that contain nicotine or tobacco, such as cigarettes and e-cigarettes. If you need help quitting, ask your health care provider. °· Be physically active every day. Include strength training and balance exercises. °· Follow a healthy meal plan. °· Work with your health care provider to manage your blood pressure. °General instructions °· Follow your diabetes management plan as directed. °? Check your blood glucose levels as directed by your health care provider. °? Keep your blood glucose in your target range as directed by your health care provider. °? Have your A1C level checked at least two times a year, or as often as told by your health care provider. °· Take over the counter and prescription medicines only as told by your health care provider. This includes insulin and diabetes medicine. °· Do not drive or use heavy machinery while taking prescription pain medicines. °· Check your  skin and feet every day for cuts, bruises, redness, blisters, or sores. °· Keep all follow up visits as told by your health care provider. This is important. °Contact a health care provider if: °· You have burning, stabbing, or aching pain in your legs or feet. °· You are unable to feel pressure or pain in your feet. °· You develop problems with digestion, such as: °? Nausea. °? Vomiting. °? Bloating. °? Constipation. °? Diarrhea. °? Abdominal pain. °· You have difficulty with urination, such as inability: °? To control when you urinate (incontinence). °? To completely empty the bladder (retention). °· You have palpitations. °· You feel dizzy, weak, or faint when you stand up. °Get help right away if: °· You cannot urinate. °· You have sudden weakness or loss of coordination. °· You have trouble speaking. °· You have pain or pressure in your chest. °· You have an irregular heart beat. °· You have sudden inability to move a part of your body. °Summary °· Diabetic neuropathy refers to nerve damage that is caused by diabetes. It can affect nerves throughout the entire body, causing numbness and pain in the arms, legs, digestive tract, heart, and other body systems. °· Keep your blood glucose level and your blood pressure in your target range, as directed by your health care provider. This can help prevent neuropathy from getting worse. °· Check your skin and feet every day for cuts, bruises, redness, blisters, or sores. °· Do not use any products that contain nicotine or tobacco, such as cigarettes and e-cigarettes. If you need help quitting, ask your health care provider. °This information is not intended to replace advice given to you by your health care provider. Make sure you discuss any questions you have with your health care provider. °Document Released: 03/09/2001 Document Revised: 02/10/2017 Document Reviewed: 02/03/2016 °Elsevier Interactive Patient Education © 2019 [employer] ° °

## 2018-05-12 NOTE — Progress Notes (Signed)
Gave her shoe fillers to make shoes fit tighter.

## 2018-05-13 DIAGNOSIS — J301 Allergic rhinitis due to pollen: Secondary | ICD-10-CM | POA: Diagnosis not present

## 2018-05-13 DIAGNOSIS — J3089 Other allergic rhinitis: Secondary | ICD-10-CM | POA: Diagnosis not present

## 2018-05-19 ENCOUNTER — Encounter: Payer: Self-pay | Admitting: Podiatry

## 2018-05-19 DIAGNOSIS — J301 Allergic rhinitis due to pollen: Secondary | ICD-10-CM | POA: Diagnosis not present

## 2018-05-19 DIAGNOSIS — J3089 Other allergic rhinitis: Secondary | ICD-10-CM | POA: Diagnosis not present

## 2018-05-19 NOTE — Progress Notes (Signed)
Subjective: [name] presents today with history of diabetic neuropathy with cc of painful, mycotic toenails.  Pain is aggravated when wearing enclosed shoe gear and relieved with periodic professional debridement.  Patient has diabetic neuropathy managed with gabapentin.  Patient has subjective symptoms of neuropathy described as numbness, tingling, burning, pins/needles.   [place] Bing, DO is her PCP and last visit was January, 2020.   Current Outpatient Medications:  .  acetaminophen (TYLENOL) 325 MG tablet, Take 2 tablets (650 mg total) by mouth every 6 (six) hours as needed for moderate pain., Disp: 60 tablet, Rfl: 1 .  aspirin 81 MG chewable tablet, Chew 1 tablet (81 mg total) by mouth daily., Disp: 100 tablet, Rfl: 6 .  atenolol (TENORMIN) 25 MG tablet, TAKE 1 TABLET(S) BY MOUTH DAILY, Disp: 90 tablet, Rfl: 3 .  atorvastatin (LIPITOR) 40 MG tablet, Take 1 tablet (40 mg total) by mouth daily., Disp: 90 tablet, Rfl: 3 .  baclofen (LIORESAL) 10 MG tablet, Take 1 tablet (10 mg total) by mouth daily as needed for muscle spasms., Disp: 30 tablet, Rfl: 1 .  citalopram (CELEXA) 20 MG tablet, TAKE 1 TABLET BY MOUTH EVERY DAY, Disp: 90 tablet, Rfl: 0 .  clarithromycin (BIAXIN) 500 MG tablet, TAKE 1 TABLET BY MOUTH EVERY 12 HOURS FOR 10 DAYS, Disp: , Rfl:  .  EPINEPHrine 0.3 mg/0.3 mL IJ SOAJ injection, USE AS DIRECTED AS NEEDED FOR SYSTEMIC REACTION, Disp: , Rfl:  .  gabapentin (NEURONTIN) 300 MG capsule, Take 3 capsules (900 mg total) by mouth 2 (two) times daily as needed., Disp: 270 capsule, Rfl: 3 .  glipiZIDE (GLUCOTROL) 10 MG tablet, TAKE ONE TABLET BY MOUTH TWICE A DAY BEFORE A MEAL, Disp: 180 tablet, Rfl: 3 .  hydrochlorothiazide (MICROZIDE) 12.5 MG capsule, Take 1 capsule (12.5 mg total) by mouth daily., Disp: 90 capsule, Rfl: 3 .  Lancets (ONETOUCH ULTRASOFT) lancets, Use as directed to test glucose 3/day.  Dx code: E11.9, Disp: 100 each, Rfl: 5 .  Magnesium 400 MG TABS,  Take 1 tablet by mouth daily., Disp: , Rfl:  .  meclizine (ANTIVERT) 25 MG tablet, Take 1-2 tablets (25-50 mg total) by mouth 2 (two) times daily as needed for dizziness. if having symptoms.  May repeat in 12 hours later., Disp: 30 tablet, Rfl: 2 .  metFORMIN (GLUCOPHAGE) 1000 MG tablet, Take 1 tablet (1,000 mg total) by mouth 2 (two) times daily with a meal., Disp: 180 tablet, Rfl: 3 .  Multiple Vitamin (MULTIVITAMIN) tablet, Take 1 tablet by mouth daily., Disp: , Rfl:  .  Omega-3 Fatty Acids (FISH OIL PO), Take 300 mg by mouth daily., Disp: , Rfl:  .  ONE TOUCH ULTRA TEST test strip, CHECK BLOOD SUGAR DAILY AS NEEDED., Disp: 100 each, Rfl: 3 .  pantoprazole (PROTONIX) 40 MG tablet, TAKE 1 TABLET EVERY DAY, Disp: 90 tablet, Rfl: 2 .  polyethylene glycol (MIRALAX / GLYCOLAX) packet, Take 17 g by mouth every other day., Disp: 14 each, Rfl: 1 .  traMADol (ULTRAM) 50 MG tablet, TAKE 1 TABLET BY MOUTH ONCE DAILY AS NEEDED FOR SEVERE PAIN, Disp: 30 tablet, Rfl: 0 .  valsartan (DIOVAN) 160 MG tablet, Take 1 tablet by mouth once daily, Disp: 30 tablet, Rfl: 0 .  vitamin B-12 (CYANOCOBALAMIN) 1000 MCG tablet, Take 1,000 mcg by mouth daily., Disp: , Rfl:  .  vitamin E 400 UNIT capsule, Take 400 Units by mouth daily., Disp: , Rfl:   Allergies  Allergen Reactions  .  Alcohol Swabs [Isopropyl Alcohol] Nausea Only    Use peroxide instead  . Penicillins Other (See Comments)    Arm swelling  . Prednisone Other (See Comments)    Could not sleep  . Amoxicillin-Pot Clavulanate Other (See Comments)    She is unsure what reaction she had    Objective: Vitals:   05/12/18 1545  Temp: 97.7 F (36.5 C)   Vascular Examination: Capillary refill time immediate x 10 digits.  Dorsalis pedis and Posterior tibial pulses palpable b/l.  Digital hair x 10 digits was absent.  Skin temperature gradient WNL b/l.  Dermatological Examination: Skin with normal turgor, texture and tone b/l.  Toenails 1-5 b/l  discolored, thick, dystrophic with subungual debris and pain with palpation to nailbeds due to thickness of nails.  Musculoskeletal: Muscle strength 5/5 to all muscle groups b/l.  No joint pain, crepitus or effusions on passive ROM b/l.  HAV with bunion b/l.  Neurological: Sensation with 10 gram monofilament is diminished b/l.  Last A1c: 7.5  Assessment: 1. Painful onychomycosis toenails 1-5 b/l 2. NIDDM with neuropathy  Plan: 1. Continue diabetic foot care principles. Literature dispensed. 2. Toenails 1-5 b/l were debrided in length and girth without iatrogenic bleeding. 3. Patient to continue soft, supportive shoe gear daily. Being measured for diabetic shoes on today. 4. Patient to report any pedal injuries to medical professional  5. Follow up 3 months.  6. Patient/POA to call should there be a concern in the interim.

## 2018-05-23 ENCOUNTER — Other Ambulatory Visit: Payer: Self-pay

## 2018-05-23 DIAGNOSIS — F411 Generalized anxiety disorder: Secondary | ICD-10-CM

## 2018-05-23 MED ORDER — CITALOPRAM HYDROBROMIDE 20 MG PO TABS: 20.0000 mg | ORAL_TABLET | Freq: Every day | ORAL | 0 refills | Status: DC

## 2018-05-26 DIAGNOSIS — J301 Allergic rhinitis due to pollen: Secondary | ICD-10-CM | POA: Diagnosis not present

## 2018-05-26 DIAGNOSIS — J3089 Other allergic rhinitis: Secondary | ICD-10-CM | POA: Diagnosis not present

## 2018-06-01 DIAGNOSIS — J3089 Other allergic rhinitis: Secondary | ICD-10-CM | POA: Diagnosis not present

## 2018-06-01 DIAGNOSIS — J301 Allergic rhinitis due to pollen: Secondary | ICD-10-CM | POA: Diagnosis not present

## 2018-06-02 ENCOUNTER — Other Ambulatory Visit: Payer: Self-pay | Admitting: Family Medicine

## 2018-06-02 DIAGNOSIS — G8929 Other chronic pain: Secondary | ICD-10-CM

## 2018-06-02 DIAGNOSIS — M5440 Lumbago with sciatica, unspecified side: Secondary | ICD-10-CM

## 2018-06-09 DIAGNOSIS — J3089 Other allergic rhinitis: Secondary | ICD-10-CM | POA: Diagnosis not present

## 2018-06-09 DIAGNOSIS — J301 Allergic rhinitis due to pollen: Secondary | ICD-10-CM | POA: Diagnosis not present

## 2018-06-10 ENCOUNTER — Other Ambulatory Visit: Payer: Self-pay | Admitting: Family Medicine

## 2018-06-24 DIAGNOSIS — J3089 Other allergic rhinitis: Secondary | ICD-10-CM | POA: Diagnosis not present

## 2018-06-24 DIAGNOSIS — J301 Allergic rhinitis due to pollen: Secondary | ICD-10-CM | POA: Diagnosis not present

## 2018-06-29 ENCOUNTER — Telehealth: Payer: Self-pay | Admitting: *Deleted

## 2018-06-29 NOTE — Telephone Encounter (Signed)
Insurance company goes out once a year for [employer].  Brayton Layman wants to report the following to MD:  PAD screen:  Left foot = 0.64  Right foot = 0.71  Patient is asymptomatic. Christen Bame, CMA

## 2018-07-01 DIAGNOSIS — J3089 Other allergic rhinitis: Secondary | ICD-10-CM | POA: Diagnosis not present

## 2018-07-01 DIAGNOSIS — J301 Allergic rhinitis due to pollen: Secondary | ICD-10-CM | POA: Diagnosis not present

## 2018-07-07 ENCOUNTER — Other Ambulatory Visit: Payer: Self-pay | Admitting: Family Medicine

## 2018-07-07 ENCOUNTER — Other Ambulatory Visit: Payer: Self-pay | Admitting: Internal Medicine

## 2018-07-07 DIAGNOSIS — E0840 Diabetes mellitus due to underlying condition with diabetic neuropathy, unspecified: Secondary | ICD-10-CM

## 2018-07-07 DIAGNOSIS — J301 Allergic rhinitis due to pollen: Secondary | ICD-10-CM | POA: Diagnosis not present

## 2018-07-07 DIAGNOSIS — J3089 Other allergic rhinitis: Secondary | ICD-10-CM | POA: Diagnosis not present

## 2018-07-13 DIAGNOSIS — J301 Allergic rhinitis due to pollen: Secondary | ICD-10-CM | POA: Diagnosis not present

## 2018-07-13 DIAGNOSIS — J3089 Other allergic rhinitis: Secondary | ICD-10-CM | POA: Diagnosis not present

## 2018-07-19 ENCOUNTER — Other Ambulatory Visit: Payer: Self-pay

## 2018-07-19 DIAGNOSIS — M5440 Lumbago with sciatica, unspecified side: Secondary | ICD-10-CM

## 2018-07-19 DIAGNOSIS — G8929 Other chronic pain: Secondary | ICD-10-CM

## 2018-07-19 MED ORDER — TRAMADOL HCL 50 MG PO TABS: ORAL_TABLET | ORAL | 0 refills | Status: DC

## 2018-07-22 DIAGNOSIS — H10413 Chronic giant papillary conjunctivitis, bilateral: Secondary | ICD-10-CM | POA: Diagnosis not present

## 2018-07-22 DIAGNOSIS — J301 Allergic rhinitis due to pollen: Secondary | ICD-10-CM | POA: Diagnosis not present

## 2018-07-22 DIAGNOSIS — J3089 Other allergic rhinitis: Secondary | ICD-10-CM | POA: Diagnosis not present

## 2018-07-29 DIAGNOSIS — J3089 Other allergic rhinitis: Secondary | ICD-10-CM | POA: Diagnosis not present

## 2018-07-29 DIAGNOSIS — J301 Allergic rhinitis due to pollen: Secondary | ICD-10-CM | POA: Diagnosis not present

## 2018-08-02 DIAGNOSIS — J3089 Other allergic rhinitis: Secondary | ICD-10-CM | POA: Diagnosis not present

## 2018-08-02 DIAGNOSIS — J301 Allergic rhinitis due to pollen: Secondary | ICD-10-CM | POA: Diagnosis not present

## 2018-08-05 DIAGNOSIS — J301 Allergic rhinitis due to pollen: Secondary | ICD-10-CM | POA: Diagnosis not present

## 2018-08-05 DIAGNOSIS — J3089 Other allergic rhinitis: Secondary | ICD-10-CM | POA: Diagnosis not present

## 2018-08-10 ENCOUNTER — Ambulatory Visit: Payer: Medicare Other | Admitting: Podiatry

## 2018-08-12 ENCOUNTER — Other Ambulatory Visit: Payer: Self-pay | Admitting: Family Medicine

## 2018-08-12 ENCOUNTER — Other Ambulatory Visit: Payer: Self-pay

## 2018-08-12 DIAGNOSIS — G8929 Other chronic pain: Secondary | ICD-10-CM

## 2018-08-12 DIAGNOSIS — M5440 Lumbago with sciatica, unspecified side: Secondary | ICD-10-CM

## 2018-08-15 DIAGNOSIS — J301 Allergic rhinitis due to pollen: Secondary | ICD-10-CM | POA: Diagnosis not present

## 2018-08-15 DIAGNOSIS — J3089 Other allergic rhinitis: Secondary | ICD-10-CM | POA: Diagnosis not present

## 2018-08-15 MED ORDER — VALSARTAN 160 MG PO TABS: 160.0000 mg | ORAL_TABLET | Freq: Every day | ORAL | 0 refills | Status: DC

## 2018-08-18 ENCOUNTER — Other Ambulatory Visit: Payer: Self-pay

## 2018-08-18 DIAGNOSIS — M5440 Lumbago with sciatica, unspecified side: Secondary | ICD-10-CM

## 2018-08-18 DIAGNOSIS — G8929 Other chronic pain: Secondary | ICD-10-CM

## 2018-08-19 ENCOUNTER — Other Ambulatory Visit: Payer: Self-pay | Admitting: Family Medicine

## 2018-08-19 DIAGNOSIS — G8929 Other chronic pain: Secondary | ICD-10-CM

## 2018-08-19 DIAGNOSIS — M5440 Lumbago with sciatica, unspecified side: Secondary | ICD-10-CM

## 2018-08-23 ENCOUNTER — Other Ambulatory Visit: Payer: Self-pay | Admitting: *Deleted

## 2018-08-23 DIAGNOSIS — E0840 Diabetes mellitus due to underlying condition with diabetic neuropathy, unspecified: Secondary | ICD-10-CM

## 2018-08-23 MED ORDER — METFORMIN HCL 1000 MG PO TABS: 1000.0000 mg | ORAL_TABLET | Freq: Two times a day (BID) | ORAL | 3 refills | Status: DC

## 2018-08-23 MED ORDER — GLIPIZIDE 10 MG PO TABS: ORAL_TABLET | ORAL | 3 refills | Status: DC

## 2018-08-25 ENCOUNTER — Other Ambulatory Visit: Payer: Self-pay | Admitting: *Deleted

## 2018-08-25 DIAGNOSIS — J3089 Other allergic rhinitis: Secondary | ICD-10-CM | POA: Diagnosis not present

## 2018-08-25 DIAGNOSIS — J301 Allergic rhinitis due to pollen: Secondary | ICD-10-CM | POA: Diagnosis not present

## 2018-08-25 DIAGNOSIS — F411 Generalized anxiety disorder: Secondary | ICD-10-CM

## 2018-08-25 MED ORDER — CITALOPRAM HYDROBROMIDE 20 MG PO TABS: 20.0000 mg | ORAL_TABLET | Freq: Every day | ORAL | 3 refills | Status: DC

## 2018-08-30 ENCOUNTER — Other Ambulatory Visit: Payer: Self-pay

## 2018-08-30 ENCOUNTER — Ambulatory Visit (INDEPENDENT_AMBULATORY_CARE_PROVIDER_SITE_OTHER): Payer: Medicare Other | Admitting: Podiatry

## 2018-08-30 ENCOUNTER — Encounter: Payer: Self-pay | Admitting: Podiatry

## 2018-08-30 ENCOUNTER — Encounter

## 2018-08-30 DIAGNOSIS — B351 Tinea unguium: Secondary | ICD-10-CM

## 2018-08-30 DIAGNOSIS — J301 Allergic rhinitis due to pollen: Secondary | ICD-10-CM | POA: Diagnosis not present

## 2018-08-30 DIAGNOSIS — J3089 Other allergic rhinitis: Secondary | ICD-10-CM | POA: Diagnosis not present

## 2018-08-30 DIAGNOSIS — E1142 Type 2 diabetes mellitus with diabetic polyneuropathy: Secondary | ICD-10-CM

## 2018-08-30 NOTE — Patient Instructions (Signed)
Diabetes Mellitus and Foot Care Foot care is an important part of your health, especially when you have diabetes. Diabetes may cause you to have problems because of poor blood flow (circulation) to your feet and legs, which can cause your skin to:  Become thinner and drier.  Break more easily.  Heal more slowly.  Peel and crack. You may also have nerve damage (neuropathy) in your legs and feet, causing decreased feeling in them. This means that you may not notice minor injuries to your feet that could lead to more serious problems. Noticing and addressing any potential problems early is the best way to prevent future foot problems. How to care for your feet Foot hygiene  Wash your feet daily with warm water and mild soap. Do not use hot water. Then, pat your feet and the areas between your toes until they are completely dry. Do not soak your feet as this can dry your skin.  Trim your toenails straight across. Do not dig under them or around the cuticle. File the edges of your nails with an emery board or nail file.  Apply a moisturizing lotion or petroleum jelly to the skin on your feet and to dry, brittle toenails. Use lotion that does not contain alcohol and is unscented. Do not apply lotion between your toes. Shoes and socks  Wear clean socks or stockings every day. Make sure they are not too tight. Do not wear knee-high stockings since they may decrease blood flow to your legs.  Wear shoes that fit properly and have enough cushioning. Always look in your shoes before you put them on to be sure there are no objects inside.  To break in new shoes, wear them for just a few hours a day. This prevents injuries on your feet. Wounds, scrapes, corns, and calluses  Check your feet daily for blisters, cuts, bruises, sores, and redness. If you cannot see the bottom of your feet, use a mirror or ask someone for help.  Do not cut corns or calluses or try to remove them with medicine.  If you  find a minor scrape, cut, or break in the skin on your feet, keep it and the skin around it clean and dry. You may clean these areas with mild soap and water. Do not clean the area with peroxide, alcohol, or iodine.  If you have a wound, scrape, corn, or callus on your foot, look at it several times a day to make sure it is healing and not infected. Check for: ? Redness, swelling, or pain. ? Fluid or blood. ? Warmth. ? Pus or a bad smell. General instructions  Do not cross your legs. This may decrease blood flow to your feet.  Do not use heating pads or hot water bottles on your feet. They may burn your skin. If you have lost feeling in your feet or legs, you may not know this is happening until it is too late.  Protect your feet from hot and cold by wearing shoes, such as at the beach or on hot pavement.  Schedule a complete foot exam at least once a year (annually) or more often if you have foot problems. If you have foot problems, report any cuts, sores, or bruises to your health care provider immediately. Contact a health care provider if:  You have a medical condition that increases your risk of infection and you have any cuts, sores, or bruises on your feet.  You have an injury that is not   healing.  You have redness on your legs or feet.  You feel burning or tingling in your legs or feet.  You have pain or cramps in your legs and feet.  Your legs or feet are numb.  Your feet always feel cold.  You have pain around a toenail. Get help right away if:  You have a wound, scrape, corn, or callus on your foot and: ? You have pain, swelling, or redness that gets worse. ? You have fluid or blood coming from the wound, scrape, corn, or callus. ? Your wound, scrape, corn, or callus feels warm to the touch. ? You have pus or a bad smell coming from the wound, scrape, corn, or callus. ? You have a fever. ? You have a red line going up your leg. Summary  Check your feet every day  for cuts, sores, red spots, swelling, and blisters.  Moisturize feet and legs daily.  Wear shoes that fit properly and have enough cushioning.  If you have foot problems, report any cuts, sores, or bruises to your health care provider immediately.  Schedule a complete foot exam at least once a year (annually) or more often if you have foot problems. This information is not intended to replace advice given to you by your health care provider. Make sure you discuss any questions you have with your health care provider. Document Released: 12/27/1999 Document Revised: 02/10/2017 Document Reviewed: 01/31/2016 Elsevier Patient Education  2020 [employer]   Onychomycosis/Fungal Toenails  WHAT IS IT? An infection that lies within the keratin of your nail plate that is caused by a fungus.  WHY ME? Fungal infections affect all ages, sexes, races, and creeds.  There may be many factors that predispose you to a fungal infection such as age, coexisting medical conditions such as diabetes, or an autoimmune disease; stress, medications, fatigue, genetics, etc.  Bottom line: fungus thrives in a warm, moist environment and your shoes offer such a location.  IS IT CONTAGIOUS? Theoretically, yes.  You do not want to share shoes, nail clippers or files with someone who has fungal toenails.  Walking around barefoot in the same room or sleeping in the same bed is unlikely to transfer the organism.  It is important to realize, however, that fungus can spread easily from one nail to the next on the same foot.  HOW DO WE TREAT THIS?  There are several ways to treat this condition.  Treatment may depend on many factors such as age, medications, pregnancy, liver and kidney conditions, etc.  It is best to ask your doctor which options are available to you.  1. No treatment.   Unlike many other medical concerns, you can live with this condition.  However for many people this can be a painful condition and may lead to  ingrown toenails or a bacterial infection.  It is recommended that you keep the nails cut short to help reduce the amount of fungal nail. 2. Topical treatment.  These range from herbal remedies to prescription strength nail lacquers.  About 40-50% effective, topicals require twice daily application for approximately 9 to 12 months or until an entirely new nail has grown out.  The most effective topicals are medical grade medications available through physicians offices. 3. Oral antifungal medications.  With an 80-90% cure rate, the most common oral medication requires 3 to 4 months of therapy and stays in your system for a year as the new nail grows out.  Oral antifungal medications do require   blood work to make sure it is a safe drug for you.  A liver function panel will be performed prior to starting the medication and after the first month of treatment.  It is important to have the blood work performed to avoid any harmful side effects.  In general, this medication safe but blood work is required. 4. Laser Therapy.  This treatment is performed by applying a specialized laser to the affected nail plate.  This therapy is noninvasive, fast, and non-painful.  It is not covered by insurance and is therefore, out of pocket.  The results have been very good with a 80-95% cure rate.  [facility] is the only practice in the area to offer this therapy. 5. Permanent Nail Avulsion.  Removing the entire nail so that a new nail will not grow back. 

## 2018-08-31 ENCOUNTER — Other Ambulatory Visit: Payer: Self-pay

## 2018-08-31 ENCOUNTER — Encounter: Payer: Self-pay | Admitting: Family Medicine

## 2018-08-31 ENCOUNTER — Ambulatory Visit (INDEPENDENT_AMBULATORY_CARE_PROVIDER_SITE_OTHER): Payer: Medicare Other | Admitting: Family Medicine

## 2018-08-31 VITALS — BP 114/58 | HR 78 | Wt 159.6 lb

## 2018-08-31 DIAGNOSIS — F411 Generalized anxiety disorder: Secondary | ICD-10-CM | POA: Diagnosis not present

## 2018-08-31 DIAGNOSIS — I10 Essential (primary) hypertension: Secondary | ICD-10-CM

## 2018-08-31 DIAGNOSIS — E0840 Diabetes mellitus due to underlying condition with diabetic neuropathy, unspecified: Secondary | ICD-10-CM

## 2018-08-31 LAB — POCT GLYCOSYLATED HEMOGLOBIN (HGB A1C): HbA1c, POC (controlled diabetic range): 8.2 % — AB (ref 0.0–7.0)

## 2018-08-31 MED ORDER — METFORMIN HCL 1000 MG PO TABS: 1000.0000 mg | ORAL_TABLET | Freq: Two times a day (BID) | ORAL | 3 refills | Status: DC

## 2018-08-31 MED ORDER — HYDROCHLOROTHIAZIDE 12.5 MG PO CAPS: 12.5000 mg | ORAL_CAPSULE | Freq: Every day | ORAL | 3 refills | Status: DC

## 2018-08-31 MED ORDER — GLIPIZIDE 10 MG PO TABS: ORAL_TABLET | ORAL | 3 refills | Status: DC

## 2018-08-31 MED ORDER — GABAPENTIN 300 MG PO CAPS: ORAL_CAPSULE | ORAL | 2 refills | Status: DC

## 2018-08-31 MED ORDER — ONETOUCH ULTRASOFT LANCETS MISC: 5 refills | Status: DC

## 2018-08-31 MED ORDER — BENZONATATE 200 MG PO CAPS: 200.0000 mg | ORAL_CAPSULE | Freq: Two times a day (BID) | ORAL | 0 refills | Status: DC | PRN

## 2018-08-31 MED ORDER — ATENOLOL 25 MG PO TABS: ORAL_TABLET | ORAL | 3 refills | Status: DC

## 2018-08-31 MED ORDER — PANTOPRAZOLE SODIUM 40 MG PO TBEC: 40.0000 mg | DELAYED_RELEASE_TABLET | Freq: Every day | ORAL | 2 refills | Status: DC

## 2018-08-31 MED ORDER — BACLOFEN 10 MG PO TABS: 10.0000 mg | ORAL_TABLET | Freq: Every day | ORAL | 1 refills | Status: DC | PRN

## 2018-08-31 MED ORDER — CITALOPRAM HYDROBROMIDE 20 MG PO TABS: 20.0000 mg | ORAL_TABLET | Freq: Every day | ORAL | 3 refills | Status: DC

## 2018-08-31 MED ORDER — VALSARTAN 160 MG PO TABS: 160.0000 mg | ORAL_TABLET | Freq: Every day | ORAL | 0 refills | Status: DC

## 2018-08-31 MED ORDER — ATORVASTATIN CALCIUM 40 MG PO TABS: 40.0000 mg | ORAL_TABLET | Freq: Every day | ORAL | 3 refills | Status: DC

## 2018-08-31 NOTE — Patient Instructions (Signed)
It was great to meet you today! Thank you for letting me participate in your care!  Today, we discussed your A1c which is a measure of your average blood sugar over the past 3 months. It is a little elevated today so I will see you in 3 months. Please take your medications as prescribed and try to avoid sugary drinks like sodas and sweets.  Be well, Harolyn Rutherford, DO PGY-3, [facility]

## 2018-08-31 NOTE — Progress Notes (Signed)
Subjective: Chief Complaint  Patient presents with  . Annual Exam    HPI: [name] is a 68 y.o. presenting to clinic today to discuss the following:  T2DM Follow Up Patient states she checks her fasting blood sugar in the mornings every morning. She did not bring her blood sugar recording with her today. She does say she has been under a lot of stress lately due to her husbands poor health. She is his primary healthcare giver at home and he suffers from multiple chronic conditions. Overall, she states her diet has "not been as good lately" and endorses drinking lots of sodas and eating foods high in sugar.  She denies any episodes of dizziness, feeling faint or that she may pass out. She does endorse taking her medications and requests refills on multiple ones today.  HTN Patient states she is compliant on her medications. She is not checking her BP at home in the mornings. Well controlled today. No headache, blurry vision, or cough.  She denies fever, cough, SOB, chest pain, abdominal pain, nausea, vomiting.      ROS noted in HPI.   Past Medical, Surgical, Social, and Family History Reviewed & Updated per EMR.   Pertinent Historical Findings include:   Social History   Tobacco Use  Smoking Status Current Every Day Smoker  . Packs/day: 1.00  . Types: Cigarettes  Smokeless Tobacco Never Used    Objective: BP (!) 114/58   Pulse 78   Wt 159 lb 9.6 oz (72.4 kg)   SpO2 96%   BMI 26.15 kg/m  Vitals and nursing notes reviewed  Physical Exam Gen: Alert and Oriented x 3, NAD CV: RRR, no murmurs, normal S1, S2 split Resp: CTAB, no wheezing, rales, or rhonchi, comfortable work of breathing Ext: no clubbing, cyanosis, or edema Skin: warm, dry, intact, no rashes  Results for orders placed or performed in visit on 08/31/18 (from the past 72 hour(s))  HgB A1c     Status: Abnormal   Collection Time: 08/31/18  3:55 PM  Result Value Ref Range   Hemoglobin A1C     HbA1c POC (<> result, manual entry)     HbA1c, POC (prediabetic range)     HbA1c, POC (controlled diabetic range) 8.2 (A) 0.0 - 7.0 %    Assessment/Plan:  Primary hypertension Remains well controlled at today's visit. No changes needed today. Do recommend at home BP checks. - Cont Atenolol 25mg  daily - Cont HCTZ 12.5mg  daily - Cont Valsartan 160mg  daily  Controlled diabetes mellitus with neurologic complication, without long-term current use of insulin (HCC) Poorly controlled with A1c above goal at 8.2%. Will give patient period to make diet modifications as she endorses poor diet recently due to [place] and at home stress related to the poor health of her husband - BMET at next checkup in 3 months - Repeat A1c in 3 months - If still not to goal will add jardiance - Cont Metformin 1000mg  BID - Cont Glipizide 10mg  BID   PATIENT EDUCATION PROVIDED: See AVS    Diagnosis and plan along with any newly prescribed medication(s) were discussed in detail with this patient today. The patient verbalized understanding and agreed with the plan. Patient advised if symptoms worsen return to clinic or ER.    Orders Placed This Encounter  Procedures  . HgB A1c    Meds ordered this encounter  Medications  . DISCONTD: glipiZIDE (GLUCOTROL) 10 MG tablet    Sig: TAKE ONE TABLET  BY MOUTH TWICE A DAY BEFORE A MEAL    Dispense:  180 tablet    Refill:  3  . DISCONTD: metFORMIN (GLUCOPHAGE) 1000 MG tablet    Sig: Take 1 tablet (1,000 mg total) by mouth 2 (two) times daily with a meal.    Dispense:  180 tablet    Refill:  3  . glipiZIDE (GLUCOTROL) 10 MG tablet    Sig: TAKE ONE TABLET BY MOUTH TWICE A DAY BEFORE A MEAL    Dispense:  180 tablet    Refill:  3  . metFORMIN (GLUCOPHAGE) 1000 MG tablet    Sig: Take 1 tablet (1,000 mg total) by mouth 2 (two) times daily with a meal.    Dispense:  180 tablet    Refill:  3  . benzonatate (TESSALON) 200 MG capsule    Sig: Take 1 capsule (200 mg total)  by mouth 2 (two) times daily as needed for cough.    Dispense:  20 capsule    Refill:  0  . atenolol (TENORMIN) 25 MG tablet    Sig: TAKE 1 TABLET(S) BY MOUTH DAILY    Dispense:  90 tablet    Refill:  3  . atorvastatin (LIPITOR) 40 MG tablet    Sig: Take 1 tablet (40 mg total) by mouth daily.    Dispense:  90 tablet    Refill:  3  . baclofen (LIORESAL) 10 MG tablet    Sig: Take 1 tablet (10 mg total) by mouth daily as needed for muscle spasms.    Dispense:  30 tablet    Refill:  1  . citalopram (CELEXA) 20 MG tablet    Sig: Take 1 tablet (20 mg total) by mouth daily.    Dispense:  90 tablet    Refill:  3  . gabapentin (NEURONTIN) 300 MG capsule    Sig: TAKE 3 CAPSULES BY MOUTH TWICE A DAY AS NEEDED    Dispense:  540 capsule    Refill:  2  . hydrochlorothiazide (MICROZIDE) 12.5 MG capsule    Sig: Take 1 capsule (12.5 mg total) by mouth daily.    Dispense:  90 capsule    Refill:  3  . Lancets (ONETOUCH ULTRASOFT) lancets    Sig: Use as directed to test glucose 3/day.  Dx code: E11.9    Dispense:  100 each    Refill:  5  . pantoprazole (PROTONIX) 40 MG tablet    Sig: Take 1 tablet (40 mg total) by mouth daily.    Dispense:  90 tablet    Refill:  2  . valsartan (DIOVAN) 160 MG tablet    Sig: Take 1 tablet (160 mg total) by mouth daily.    Dispense:  30 tablet    Refill:  0     Harolyn Rutherford, DO 08/31/2018, 3:50 PM PGY-3 [place]

## 2018-09-02 ENCOUNTER — Telehealth: Payer: Self-pay

## 2018-09-02 DIAGNOSIS — J301 Allergic rhinitis due to pollen: Secondary | ICD-10-CM | POA: Diagnosis not present

## 2018-09-02 DIAGNOSIS — J3089 Other allergic rhinitis: Secondary | ICD-10-CM | POA: Diagnosis not present

## 2018-09-02 NOTE — Telephone Encounter (Signed)
Left a message regarding appt on 09/05/18.

## 2018-09-03 NOTE — Assessment & Plan Note (Signed)
Poorly controlled with A1c above goal at 8.2%. Will give patient period to make diet modifications as she endorses poor diet recently due to [place] and at home stress related to the poor health of her husband - BMET at next checkup in 3 months - Repeat A1c in 3 months - If still not to goal will add jardiance - Cont Metformin 1000mg  BID - Cont Glipizide 10mg  BID

## 2018-09-03 NOTE — Assessment & Plan Note (Signed)
Remains well controlled at today's visit. No changes needed today. Do recommend at home BP checks. - Cont Atenolol 25mg  daily - Cont HCTZ 12.5mg  daily - Cont Valsartan 160mg  daily

## 2018-09-05 ENCOUNTER — Ambulatory Visit (INDEPENDENT_AMBULATORY_CARE_PROVIDER_SITE_OTHER): Payer: Medicare Other | Admitting: Internal Medicine

## 2018-09-05 ENCOUNTER — Other Ambulatory Visit: Payer: Self-pay

## 2018-09-05 VITALS — BP 124/78 | HR 66 | Ht 65.0 in | Wt 159.0 lb

## 2018-09-05 DIAGNOSIS — I700 Atherosclerosis of aorta: Secondary | ICD-10-CM | POA: Diagnosis not present

## 2018-09-05 DIAGNOSIS — Z8679 Personal history of other diseases of the circulatory system: Secondary | ICD-10-CM

## 2018-09-05 DIAGNOSIS — Z9889 Other specified postprocedural states: Secondary | ICD-10-CM | POA: Diagnosis not present

## 2018-09-05 LAB — HEPATIC FUNCTION PANEL
ALT: 24 IU/L (ref 0–32)
AST: 20 IU/L (ref 0–40)
Albumin: 4.2 g/dL (ref 3.8–4.8)
Alkaline Phosphatase: 96 IU/L (ref 39–117)
Bilirubin Total: 0.3 mg/dL (ref 0.0–1.2)
Bilirubin, Direct: 0.12 mg/dL (ref 0.00–0.40)
Total Protein: 6.3 g/dL (ref 6.0–8.5)

## 2018-09-05 LAB — CBC WITH DIFFERENTIAL/PLATELET
Basophils Absolute: 0 10*3/uL (ref 0.0–0.2)
Basos: 0 %
EOS (ABSOLUTE): 0.1 10*3/uL (ref 0.0–0.4)
Eos: 1 %
Hematocrit: 37.3 % (ref 34.0–46.6)
Hemoglobin: 13.1 g/dL (ref 11.1–15.9)
Immature Grans (Abs): 0 10*3/uL (ref 0.0–0.1)
Immature Granulocytes: 0 %
Lymphocytes Absolute: 5.9 10*3/uL — ABNORMAL HIGH (ref 0.7–3.1)
Lymphs: 58 %
MCH: 32.2 pg (ref 26.6–33.0)
MCHC: 35.1 g/dL (ref 31.5–35.7)
MCV: 92 fL (ref 79–97)
Monocytes Absolute: 0.5 10*3/uL (ref 0.1–0.9)
Monocytes: 5 %
Neutrophils Absolute: 3.7 10*3/uL (ref 1.4–7.0)
Neutrophils: 36 %
Platelets: 259 10*3/uL (ref 150–450)
RBC: 4.07 x10E6/uL (ref 3.77–5.28)
RDW: 12.3 % (ref 11.7–15.4)
WBC: 10.2 10*3/uL (ref 3.4–10.8)

## 2018-09-05 LAB — TSH: TSH: 0.403 u[IU]/mL — ABNORMAL LOW (ref 0.450–4.500)

## 2018-09-05 NOTE — Progress Notes (Signed)
HPI [name] returns today for followup after an almost 2 year absence from our clinic. She is a pleasant 68 yo woman with SVT and HTN. She smokes less than a ppd. She has not had syncope. She notes that she is not sleeping as well at night. Her husband says she snores but he has dementia. She notes that her energy has been down but does not have any specific complaints but is not sleeping well at night.  Allergies  Allergen Reactions  . Alcohol Swabs [Isopropyl Alcohol] Nausea Only    Use peroxide instead  . Penicillins Other (See Comments)    Arm swelling  . Prednisone Other (See Comments)    Could not sleep  . Amoxicillin-Pot Clavulanate Other (See Comments)    She is unsure what reaction she had     Current Outpatient Medications  Medication Sig Dispense Refill  . acetaminophen (TYLENOL) 325 MG tablet Take 2 tablets (650 mg total) by mouth every 6 (six) hours as needed for moderate pain. 60 tablet 1  . aspirin 81 MG chewable tablet Chew 1 tablet (81 mg total) by mouth daily. 100 tablet 6  . atenolol (TENORMIN) 25 MG tablet TAKE 1 TABLET(S) BY MOUTH DAILY 90 tablet 3  . atorvastatin (LIPITOR) 40 MG tablet Take 1 tablet (40 mg total) by mouth daily. 90 tablet 3  . baclofen (LIORESAL) 10 MG tablet Take 1 tablet (10 mg total) by mouth daily as needed for muscle spasms. 30 tablet 1  . benzonatate (TESSALON) 200 MG capsule Take 1 capsule (200 mg total) by mouth 2 (two) times daily as needed for cough. 20 capsule 0  . citalopram (CELEXA) 20 MG tablet Take 1 tablet (20 mg total) by mouth daily. 90 tablet 3  . EPINEPHrine 0.3 mg/0.3 mL IJ SOAJ injection USE AS DIRECTED AS NEEDED FOR SYSTEMIC REACTION    . gabapentin (NEURONTIN) 300 MG capsule TAKE 3 CAPSULES BY MOUTH TWICE A DAY AS NEEDED 540 capsule 2  . glipiZIDE (GLUCOTROL) 10 MG tablet TAKE ONE TABLET BY MOUTH TWICE A DAY BEFORE A MEAL 180 tablet 3  . hydrochlorothiazide (MICROZIDE) 12.5 MG capsule Take 1 capsule (12.5 mg  total) by mouth daily. 90 capsule 3  . Lancets (ONETOUCH ULTRASOFT) lancets Use as directed to test glucose 3/day.  Dx code: E11.9 100 each 5  . Magnesium 400 MG TABS Take 1 tablet by mouth daily.    . meclizine (ANTIVERT) 25 MG tablet Take 1-2 tablets (25-50 mg total) by mouth 2 (two) times daily as needed for dizziness. if having symptoms.  May repeat in 12 hours later. 30 tablet 2  . metFORMIN (GLUCOPHAGE) 1000 MG tablet Take 1 tablet (1,000 mg total) by mouth 2 (two) times daily with a meal. 180 tablet 3  . Multiple Vitamin (MULTIVITAMIN) tablet Take 1 tablet by mouth daily.    . Omega-3 Fatty Acids (FISH OIL PO) Take 300 mg by mouth daily.    . ONE TOUCH ULTRA TEST test strip CHECK BLOOD SUGAR DAILY AS NEEDED. 100 each 3  . pantoprazole (PROTONIX) 40 MG tablet Take 1 tablet (40 mg total) by mouth daily. 90 tablet 2  . polyethylene glycol (MIRALAX / GLYCOLAX) packet Take 17 g by mouth every other day. 14 each 1  . traMADol (ULTRAM) 50 MG tablet TAKE 1 TABLET BY MOUTH ONCE DAILY AS NEEDED FOR SEVERE PAIN 30 tablet 0  . valsartan (DIOVAN) 160 MG tablet Take 1 tablet (160 mg total)  by mouth daily. 30 tablet 0  . vitamin B-12 (CYANOCOBALAMIN) 1000 MCG tablet Take 1,000 mcg by mouth daily.    . vitamin E 400 UNIT capsule Take 400 Units by mouth daily.     No current facility-administered medications for this visit.      Past Medical History:  Diagnosis Date  . Allergy   . Anxiety   . AR (allergic rhinitis)   . Asthma   . Cataract   . CHF (congestive heart failure) ([place])   . Colon polyps    hyperplastic  . Disturbance of skin sensation    facial paresthesia; left  . DM2 (diabetes mellitus, type 2) ([place])   . Dyslipidemia   . Fatty liver   . GERD (gastroesophageal reflux disease)   . Hemorrhoids   . Hyperlipidemia   . Hypertension    essential, benign   . Insomnia   . Menopausal syndrome   . Palpitations    hx  . Routine general medical examination at a health care facility    . Screening for malignant neoplasm of the cervix   . Skin lesion   . SVT (supraventricular tachycardia) ([place])   . Tobacco abuse   . Weakness     ROS:   All systems reviewed and negative except as noted in the HPI.   Past Surgical History:  Procedure Laterality Date  . ABDOMINAL HYSTERECTOMY  1990   For fibroids benign.  Still has ovaries  . ablation for SVT  9/09   Dr. Lovena Le  . APPENDECTOMY    . CATARACT EXTRACTION, BILATERAL    . CESAREAN SECTION    . COLONOSCOPY  2009  . dental extractions    . HEMORRHOID BANDING  2014   w/Dr.Medoff  . SIGMOIDOSCOPY  2004     Family History  Problem Relation Age of Onset  . Heart attack Father 85  . Hypertension Mother   . Stroke Mother   . Diabetes Mother   . Stomach cancer Mother   . Cancer Mother   . Diabetes Brother   . Coronary artery disease Other        female, 1st degree relative <50  . Stomach cancer Maternal Aunt   . Diabetes Brother   . Colon cancer Neg Hx   . Esophageal cancer Neg Hx      Social History   Socioeconomic History  . Marital status: Married    Spouse name: Not on file  . Number of children: 3  . Years of education: 60  . Highest education level: Some college, no degree  Occupational History  . Occupation: Retired    Comment: Dance movement psychotherapist  Social Needs  . Financial resource strain: Not hard at all  . Food insecurity    Worry: Never true    Inability: Never true  . Transportation needs    Medical: No    Non-medical: No  Tobacco Use  . Smoking status: Current Every Day Smoker    Packs/day: 1.00    Types: Cigarettes  . Smokeless tobacco: Never Used  Substance and Sexual Activity  . Alcohol use: Not Currently  . Drug use: No  . Sexual activity: Not Currently  Lifestyle  . Physical activity    Days per week: 0 days    Minutes per session: 0 min  . Stress: To some extent  Relationships  . Social connections    Talks on phone: More than three times a week     Gets together: Once  a week    Attends religious service: 1 to 4 times per year    Active member of club or organization: Yes    Attends meetings of clubs or organizations: More than 4 times per year    Relationship status: Married  . Intimate partner violence    Fear of current or ex partner: No    Emotionally abused: No    Physically abused: No    Forced sexual activity: No  Other Topics Concern  . Not on file  Social History Narrative   Married, does not get regular exercise. On disability.    2 miscarriages, one at 4 and one at 5 months; death at 1 day, unsure of cause.       Health Care POA: on file   Emergency Contact: Jeanene Erb (friend) [phone] Terri Piedra (friend's daughter) [phone]   End of Life Plan: Gave pt pamphlet   Lives in 2 story apartment. Handrails on stairs. Smoke alarms, has throw rugs with backing on them. No grab bars in bathroom.   Who lives with you: husband   Any pets: 0   Diet: pt has varied diet. Eats vegetables, some fruit, and meat.   Exercise: pt is disabled and does not have a regular exercise routine   Seatbelts: Pt reports wearing seatbelt when in vehicle   Sun Exposure/Protection: Pt reports wearing a hat, sunglasses and facial sunscreen when outside   Hobbies: shopping      BP - 124/76  , P - 66  , R - 16 Physical Exam:  Well appearing NAD HEENT: Unremarkable Neck:  No JVD, no thyromegally Lymphatics:  No adenopathy Back:  No CVA tenderness Lungs:  Clear with no wheezes HEART:  Regular rate rhythm, no murmurs, no rubs, no clicks Abd:  soft, positive bowel sounds, no organomegally, no rebound, no guarding Ext:  2 plus pulses, no edema, no cyanosis, no clubbing Skin:  No rashes no nodules Neuro:  CN II through XII intact, motor grossly intact  EKG - nsr  Assess/Plan: 1. SVT - she has not had any symptoms over the past couple of years. 2. HTN - her blood pressure is well controlled. She will continue her current  meds. 3. Tobacco abuse - I discussed her smoking habit. She is not interested in quitting. 4. Fatigue - the etiology is unclear. As she has not had any blood drawn in over a year, I will ask her to obtain a CBC, liver panel and TSH.   Mikle Bosworth.D.

## 2018-09-05 NOTE — Patient Instructions (Signed)
Medication Instructions:  Your physician recommends that you continue on your current medications as directed. Please refer to the Current Medication list given to you today.  If you need a refill on your cardiac medications before your next appointment, please call your pharmacy.   Lab work: TODAY - liver panel, TSH, CBC w/Diff If you have labs (blood work) drawn today and your tests are completely normal, you will receive your results only by: Marland Kitchen MyChart Message (if you have MyChart) OR . A paper copy in the mail If you have any lab test that is abnormal or we need to change your treatment, we will call you to review the results.   Testing/Procedures: None Ordered   Follow-Up: Your physician recommends that you schedule a follow-up appointment in: 6 months with Dr. Lovena Le. You will receive a letter in the mail approximately 2-3 months prior to the appointment time. Please call to schedule your appointment when you receive the letter.

## 2018-09-05 NOTE — Telephone Encounter (Signed)
Follow Up  Patient returning call. Please give patient a call back.  

## 2018-09-06 DIAGNOSIS — J301 Allergic rhinitis due to pollen: Secondary | ICD-10-CM | POA: Diagnosis not present

## 2018-09-06 DIAGNOSIS — J3089 Other allergic rhinitis: Secondary | ICD-10-CM | POA: Diagnosis not present

## 2018-09-08 ENCOUNTER — Telehealth: Payer: Self-pay

## 2018-09-08 DIAGNOSIS — Z79899 Other long term (current) drug therapy: Secondary | ICD-10-CM

## 2018-09-08 DIAGNOSIS — E0590 Thyrotoxicosis, unspecified without thyrotoxic crisis or storm: Secondary | ICD-10-CM

## 2018-09-08 NOTE — Progress Notes (Signed)
Subjective:  [name] presents to clinic today with history of diabetic neuropathy and cc of  painful, thick, discolored, elongated toenails 1-5 b/l that become tender and cannot cut because of thickness. Pain is aggravated when wearing enclosed shoe gear and relieved with periodic professional debridement.  She voices no new pedal problems on today's visit.  Nuala Alpha, DO is her PCP.   Current Outpatient Medications:  .  acetaminophen (TYLENOL) 325 MG tablet, Take 2 tablets (650 mg total) by mouth every 6 (six) hours as needed for moderate pain., Disp: 60 tablet, Rfl: 1 .  aspirin 81 MG chewable tablet, Chew 1 tablet (81 mg total) by mouth daily., Disp: 100 tablet, Rfl: 6 .  atenolol (TENORMIN) 25 MG tablet, TAKE 1 TABLET(S) BY MOUTH DAILY, Disp: 90 tablet, Rfl: 3 .  atorvastatin (LIPITOR) 40 MG tablet, Take 1 tablet (40 mg total) by mouth daily., Disp: 90 tablet, Rfl: 3 .  baclofen (LIORESAL) 10 MG tablet, Take 1 tablet (10 mg total) by mouth daily as needed for muscle spasms., Disp: 30 tablet, Rfl: 1 .  benzonatate (TESSALON) 200 MG capsule, Take 1 capsule (200 mg total) by mouth 2 (two) times daily as needed for cough., Disp: 20 capsule, Rfl: 0 .  citalopram (CELEXA) 20 MG tablet, Take 1 tablet (20 mg total) by mouth daily., Disp: 90 tablet, Rfl: 3 .  EPINEPHrine 0.3 mg/0.3 mL IJ SOAJ injection, USE AS DIRECTED AS NEEDED FOR SYSTEMIC REACTION, Disp: , Rfl:  .  gabapentin (NEURONTIN) 300 MG capsule, TAKE 3 CAPSULES BY MOUTH TWICE A DAY AS NEEDED, Disp: 540 capsule, Rfl: 2 .  glipiZIDE (GLUCOTROL) 10 MG tablet, TAKE ONE TABLET BY MOUTH TWICE A DAY BEFORE A MEAL, Disp: 180 tablet, Rfl: 3 .  hydrochlorothiazide (MICROZIDE) 12.5 MG capsule, Take 1 capsule (12.5 mg total) by mouth daily., Disp: 90 capsule, Rfl: 3 .  Lancets (ONETOUCH ULTRASOFT) lancets, Use as directed to test glucose 3/day.  Dx code: E11.9, Disp: 100 each, Rfl: 5 .  Magnesium 400 MG TABS, Take 1 tablet by mouth  daily., Disp: , Rfl:  .  meclizine (ANTIVERT) 25 MG tablet, Take 1-2 tablets (25-50 mg total) by mouth 2 (two) times daily as needed for dizziness. if having symptoms.  May repeat in 12 hours later., Disp: 30 tablet, Rfl: 2 .  metFORMIN (GLUCOPHAGE) 1000 MG tablet, Take 1 tablet (1,000 mg total) by mouth 2 (two) times daily with a meal., Disp: 180 tablet, Rfl: 3 .  Multiple Vitamin (MULTIVITAMIN) tablet, Take 1 tablet by mouth daily., Disp: , Rfl:  .  Omega-3 Fatty Acids (FISH OIL PO), Take 300 mg by mouth daily., Disp: , Rfl:  .  ONE TOUCH ULTRA TEST test strip, CHECK BLOOD SUGAR DAILY AS NEEDED., Disp: 100 each, Rfl: 3 .  pantoprazole (PROTONIX) 40 MG tablet, Take 1 tablet (40 mg total) by mouth daily., Disp: 90 tablet, Rfl: 2 .  polyethylene glycol (MIRALAX / GLYCOLAX) packet, Take 17 g by mouth every other day., Disp: 14 each, Rfl: 1 .  traMADol (ULTRAM) 50 MG tablet, TAKE 1 TABLET BY MOUTH ONCE DAILY AS NEEDED FOR SEVERE PAIN, Disp: 30 tablet, Rfl: 0 .  valsartan (DIOVAN) 160 MG tablet, Take 1 tablet (160 mg total) by mouth daily., Disp: 30 tablet, Rfl: 0 .  vitamin B-12 (CYANOCOBALAMIN) 1000 MCG tablet, Take 1,000 mcg by mouth daily., Disp: , Rfl:  .  vitamin E 400 UNIT capsule, Take 400 Units by mouth daily., Disp: , Rfl:  Allergies  Allergen Reactions  . Alcohol Swabs [Isopropyl Alcohol] Nausea Only    Use peroxide instead  . Penicillins Other (See Comments)    Arm swelling  . Prednisone Other (See Comments)    Could not sleep  . Amoxicillin-Pot Clavulanate Other (See Comments)    She is unsure what reaction she had     Objective: There were no vitals filed for this visit.  Physical Examination:  Vascular Examination: Capillary refill time immediate x10 digits.  Dorsalis pedis and posterior tibial pulses remain palpable bilaterally.  Digital hair is absent bilaterally.  Skin temperature gradient is within normal limits bilaterally.  Dermatological Examination: Skin with  normal turgor, texture and tone b/l.  Her toenails are noted to be elongated, discolored, thickened and dystrophic with subungual debris.  There is pain with dorsal palpation of the nailbeds.  There are no open wounds noted bilaterally.  No interdigital macerations noted bilaterally.  Musculoskeletal Examination: Muscle strength 5/5 to all muscle groups b/l. Bilateral bunion deformity noted. No pain, crepitus or joint discomfort with active/passive ROM.  Neurological Examination: Sensation diminished when tested with 10 gram monofilament.  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 Continue diabetic foot care principles.  Literature dispensed on today. 2. Toenails 1-5 b/l were debrided in length and girth without iatrogenic laceration. 3. Continue soft, supportive shoe gear daily. 4. Report any pedal injuries to medical professional 5. Follow up 3 months. 6. Patient/POA to call should there be a question/concern in there interim.

## 2018-09-08 NOTE — Telephone Encounter (Signed)
-----   Message from Evans Lance, MD sent at 09/07/2018  7:14 PM EDT ----- Let her know that her blood work is ok except the blood work suggests that her thyroid function is off. Too much. She needs to check a free T4, T3 and if too high, followup with her medical MD.

## 2018-09-09 ENCOUNTER — Other Ambulatory Visit: Payer: Self-pay

## 2018-09-09 ENCOUNTER — Other Ambulatory Visit: Payer: Medicare Other | Admitting: *Deleted

## 2018-09-09 DIAGNOSIS — E0590 Thyrotoxicosis, unspecified without thyrotoxic crisis or storm: Secondary | ICD-10-CM

## 2018-09-09 DIAGNOSIS — Z79899 Other long term (current) drug therapy: Secondary | ICD-10-CM

## 2018-09-09 DIAGNOSIS — J3089 Other allergic rhinitis: Secondary | ICD-10-CM | POA: Diagnosis not present

## 2018-09-09 DIAGNOSIS — J301 Allergic rhinitis due to pollen: Secondary | ICD-10-CM | POA: Diagnosis not present

## 2018-09-09 NOTE — Telephone Encounter (Signed)
Pt verbalized understanding and will come in next week for her new lab orders.

## 2018-09-09 NOTE — Telephone Encounter (Signed)
LMTCB... labs ordered

## 2018-09-09 NOTE — Telephone Encounter (Signed)
Follow up ° ° °Patient is returning call per the previous message. Please call. °

## 2018-09-10 LAB — T4, FREE: Free T4: 1.21 ng/dL (ref 0.82–1.77)

## 2018-09-10 LAB — T3: T3, Total: 115 ng/dL (ref 71–180)

## 2018-09-12 ENCOUNTER — Other Ambulatory Visit: Payer: Medicare Other

## 2018-09-15 DIAGNOSIS — J301 Allergic rhinitis due to pollen: Secondary | ICD-10-CM | POA: Diagnosis not present

## 2018-09-15 DIAGNOSIS — J3089 Other allergic rhinitis: Secondary | ICD-10-CM | POA: Diagnosis not present

## 2018-09-16 ENCOUNTER — Ambulatory Visit (INDEPENDENT_AMBULATORY_CARE_PROVIDER_SITE_OTHER): Payer: Medicare Other | Admitting: Family Medicine

## 2018-09-16 ENCOUNTER — Encounter: Payer: Self-pay | Admitting: Family Medicine

## 2018-09-16 ENCOUNTER — Other Ambulatory Visit: Payer: Self-pay

## 2018-09-16 VITALS — BP 112/72 | HR 83

## 2018-09-16 DIAGNOSIS — Z23 Encounter for immunization: Secondary | ICD-10-CM | POA: Diagnosis not present

## 2018-09-16 DIAGNOSIS — R5383 Other fatigue: Secondary | ICD-10-CM | POA: Diagnosis not present

## 2018-09-16 DIAGNOSIS — E119 Type 2 diabetes mellitus without complications: Secondary | ICD-10-CM | POA: Diagnosis not present

## 2018-09-16 NOTE — Progress Notes (Signed)
     Subjective: Chief Complaint  Patient presents with  . Fatigue   HPI: [name] is a 68 y.o. presenting to clinic today to discuss the following:  Fatigue Patient has continued fatigue that is a chronic issue. Lately, she has felt like she has lacked more energy than usual. She is not sleeping well, waking up several times in the night to urinate. She wakes up tired sometimes but not always. When she wakes up at night she reports no issues going back to sleep. It takes her several hours after laying down to fall to sleep. She is mildly stressed due to the poor health of her husband and she feels this may be making it more difficult for her to relax. She does watch TV in bed at night before falling asleep. No night sweats, no fevers, chills, and no weight loss.   ROS noted in HPI.   Past Medical, Surgical, Social, and Family History Reviewed & Updated per EMR.   Pertinent Historical Findings include:   Social History   Tobacco Use  Smoking Status Current Every Day Smoker  . Packs/day: 1.00  . Types: Cigarettes  Smokeless Tobacco Never Used   Objective: BP 112/72   Pulse 83   SpO2 97%  Vitals and nursing notes reviewed  Physical Exam Gen: Alert and Oriented x 3, NAD CV: RRR, no murmurs, normal S1, S2 split Resp: CTAB, no wheezing, rales, or rhonchi, comfortable work of breathing Ext: no clubbing, cyanosis, or edema Skin: warm, dry, intact, no rashes  LABS TSH - 0.403 (8/24) T3 - 115 (8/28) T4, free - 1.21 (8/28) Iron - 98 TIBC - 298 Ferritin - 73 Vit B12 - 970 Folate - 18.0  Assessment/Plan:  Fatigue DDx includes OSA vs poor sleep hygiene as most likely given her history. Given her TSH was a little low but her T3 and T4 were normal it is unlikely to be thyroid related. I will check iron, ferritin, and vit B12, and folate as well. Her last CBC (on 09/05/2018) was normal so will not recheck and it showed no anemia. - F/u labs and if all normal suggest to  patient she let me refer her for a sleep study.   PATIENT EDUCATION PROVIDED: See AVS    Diagnosis and plan along with any newly prescribed medication(s) were discussed in detail with this patient today. The patient verbalized understanding and agreed with the plan. Patient advised if symptoms worsen return to clinic or ER.    Orders Placed This Encounter  Procedures  . Ferritin  . Vitamin B12  . Folate  . Iron and TIBC([facility])    Harolyn Rutherford, DO 09/16/2018, 11:28 AM PGY-3 [facility]

## 2018-09-16 NOTE — Patient Instructions (Signed)
It was great to see you today! Thank you for letting me participate in your care!  Today, we discussed your lack of energy and fatigue. I am concerned about your sleeping and worried you may not be getting enough of the right type of sleep. However, we will get blood work today to make sure nothing else is contributing to your fatigue. I will call you with results.  Be well, Harolyn Rutherford, DO PGY-3, [facility]

## 2018-09-17 LAB — IRON AND TIBC
Iron Saturation: 33 % (ref 15–55)
Iron: 98 ug/dL (ref 27–139)
Total Iron Binding Capacity: 298 ug/dL (ref 250–450)
UIBC: 200 ug/dL (ref 118–369)

## 2018-09-17 LAB — FOLATE: Folate: 18 ng/mL (ref >3.0)

## 2018-09-17 LAB — FERRITIN: Ferritin: 73 ng/mL (ref 15–150)

## 2018-09-17 LAB — VITAMIN B12: Vitamin B-12: 970 pg/mL (ref 232–1245)

## 2018-09-20 DIAGNOSIS — R5383 Other fatigue: Secondary | ICD-10-CM | POA: Insufficient documentation

## 2018-09-20 NOTE — Assessment & Plan Note (Addendum)
DDx includes OSA vs poor sleep hygiene as most likely given her history. Given her TSH was a little low but her T3 and T4 were normal it is unlikely to be thyroid related. I will check iron, ferritin, and vit B12, and folate as well. Her last CBC (on 09/05/2018) was normal so will not recheck and it showed no anemia. - F/u labs and if all normal suggest to patient she let me refer her for a sleep study. - Will also obtain PHQ-9 at next visit.

## 2018-09-21 ENCOUNTER — Encounter: Payer: Self-pay | Admitting: Family Medicine

## 2018-09-21 NOTE — Progress Notes (Signed)
Normal results letter.

## 2018-09-22 ENCOUNTER — Other Ambulatory Visit: Payer: Self-pay | Admitting: Family Medicine

## 2018-09-22 DIAGNOSIS — G8929 Other chronic pain: Secondary | ICD-10-CM

## 2018-09-23 DIAGNOSIS — J301 Allergic rhinitis due to pollen: Secondary | ICD-10-CM | POA: Diagnosis not present

## 2018-09-23 DIAGNOSIS — J3089 Other allergic rhinitis: Secondary | ICD-10-CM | POA: Diagnosis not present

## 2018-09-29 ENCOUNTER — Other Ambulatory Visit: Payer: Self-pay | Admitting: *Deleted

## 2018-09-30 DIAGNOSIS — J3089 Other allergic rhinitis: Secondary | ICD-10-CM | POA: Diagnosis not present

## 2018-09-30 DIAGNOSIS — J301 Allergic rhinitis due to pollen: Secondary | ICD-10-CM | POA: Diagnosis not present

## 2018-09-30 MED ORDER — GLUCOSE BLOOD VI STRP: ORAL_STRIP | 12 refills | Status: DC

## 2018-10-05 DIAGNOSIS — J301 Allergic rhinitis due to pollen: Secondary | ICD-10-CM | POA: Diagnosis not present

## 2018-10-05 DIAGNOSIS — J3089 Other allergic rhinitis: Secondary | ICD-10-CM | POA: Diagnosis not present

## 2018-10-13 DIAGNOSIS — J301 Allergic rhinitis due to pollen: Secondary | ICD-10-CM | POA: Diagnosis not present

## 2018-10-13 DIAGNOSIS — J3089 Other allergic rhinitis: Secondary | ICD-10-CM | POA: Diagnosis not present

## 2018-10-17 DIAGNOSIS — Z23 Encounter for immunization: Secondary | ICD-10-CM | POA: Diagnosis not present

## 2018-10-17 DIAGNOSIS — J3089 Other allergic rhinitis: Secondary | ICD-10-CM | POA: Diagnosis not present

## 2018-10-17 DIAGNOSIS — J301 Allergic rhinitis due to pollen: Secondary | ICD-10-CM | POA: Diagnosis not present

## 2018-10-19 ENCOUNTER — Other Ambulatory Visit: Payer: Self-pay | Admitting: Family Medicine

## 2018-10-19 DIAGNOSIS — G8929 Other chronic pain: Secondary | ICD-10-CM

## 2018-10-26 DIAGNOSIS — J3089 Other allergic rhinitis: Secondary | ICD-10-CM | POA: Diagnosis not present

## 2018-10-26 DIAGNOSIS — J301 Allergic rhinitis due to pollen: Secondary | ICD-10-CM | POA: Diagnosis not present

## 2018-11-04 DIAGNOSIS — J3089 Other allergic rhinitis: Secondary | ICD-10-CM | POA: Diagnosis not present

## 2018-11-04 DIAGNOSIS — J301 Allergic rhinitis due to pollen: Secondary | ICD-10-CM | POA: Diagnosis not present

## 2018-11-07 ENCOUNTER — Ambulatory Visit (INDEPENDENT_AMBULATORY_CARE_PROVIDER_SITE_OTHER): Payer: Medicare Other | Admitting: Family Medicine

## 2018-11-07 ENCOUNTER — Other Ambulatory Visit: Payer: Self-pay

## 2018-11-07 VITALS — BP 118/68 | HR 72 | Wt 158.6 lb

## 2018-11-07 DIAGNOSIS — R222 Localized swelling, mass and lump, trunk: Secondary | ICD-10-CM

## 2018-11-07 DIAGNOSIS — Z1211 Encounter for screening for malignant neoplasm of colon: Secondary | ICD-10-CM

## 2018-11-07 MED ORDER — SULFAMETHOXAZOLE-TRIMETHOPRIM 800-160 MG PO TABS: 1.0000 | ORAL_TABLET | Freq: Two times a day (BID) | ORAL | 0 refills | Status: AC

## 2018-11-07 NOTE — Patient Instructions (Addendum)
Dear [name],   It was good to see you! Thank you for taking your time to come in to be seen. Today, we discussed the following:   Bump Under breast   Abscess  Please take Bactrim twice daily for 7 days. If this does not improve your abscess, please return for incision and drainage of abscess.   If you experience any fevers, chills, worsening of abscess, quickly moving redness around the area, please call our clinic immediately for instruction or go to the nearest ED.   I have attached some information below about skin abscesses and incision and drainage if you have any questions.  You can also try using a warm clean wet compress multiple times a day in addition to the antibiotics.  Be well,   Zettie Cooley, M.D   [facility] [phone]  *Sign up for MyChart for instant access to your health profile, labs, orders, upcoming appointments or to contact your provider with questions*  ===================================================================================  Skin Abscess  A skin abscess is an infected area on or under your skin that contains a collection of pus and other material. An abscess may also be called a furuncle, carbuncle, or boil. An abscess can occur in or on almost any part of your body. Some abscesses break open (rupture) on their own. Most continue to get worse unless they are treated. The infection can spread deeper into the body and eventually into your blood, which can make you feel ill. Treatment usually involves draining the abscess. What are the causes? An abscess occurs when germs, like bacteria, pass through your skin and cause an infection. This may be caused by:  A scrape or cut on your skin.  A puncture wound through your skin, including a needle injection or insect bite.  Blocked oil or sweat glands.  Blocked and infected hair follicles.  A cyst that forms beneath your skin (sebaceous cyst) and becomes infected. What  increases the risk? This condition is more likely to develop in people who:  Have a weak body defense system (immune system).  Have diabetes.  Have dry and irritated skin.  Get frequent injections or use illegal IV drugs.  Have a foreign body in a wound, such as a splinter.  Have problems with their lymph system or veins. What are the signs or symptoms? Symptoms of this condition include:  A painful, firm bump under the skin.  A bump with pus at the top. This may break through the skin and drain. Other symptoms include:  Redness surrounding the abscess site.  Warmth.  Swelling of the lymph nodes (glands) near the abscess.  Tenderness.  A sore on the skin. How is this diagnosed? This condition may be diagnosed based on:  A physical exam.  Your medical history.  A sample of pus. This may be used to find out what is causing the infection.  Blood tests.  Imaging tests, such as an ultrasound, CT scan, or MRI. How is this treated? A small abscess that drains on its own may not need treatment. Treatment for larger abscesses may include:  Moist heat or heat pack applied to the area several times a day.  A procedure to drain the abscess (incision and drainage).  Antibiotic medicines. For a severe abscess, you may first get antibiotics through an IV and then change to antibiotics by mouth. Follow these instructions at home: Medicines   Take over-the-counter and prescription medicines only as told by your health care provider.  If you were prescribed an antibiotic medicine, take it as told by your health care provider. Do not stop taking the antibiotic even if you start to feel better. Abscess care   If you have an abscess that has not drained, apply heat to the affected area. Use the heat source that your health care provider recommends, such as a moist heat pack or a heating pad. ? Place a towel between your skin and the heat source. ? Leave the heat on for 20-30  minutes. ? Remove the heat if your skin turns bright red. This is especially important if you are unable to feel pain, heat, or cold. You may have a greater risk of getting burned.  Follow instructions from your health care provider about how to take care of your abscess. Make sure you: ? Cover the abscess with a bandage (dressing). ? Change your dressing or gauze as told by your health care provider. ? Wash your hands with soap and water before you change the dressing or gauze. If soap and water are not available, use hand sanitizer.  Check your abscess every day for signs of a worsening infection. Check for: ? More redness, swelling, or pain. ? More fluid or blood. ? Warmth. ? More pus or a bad smell. General instructions  To avoid spreading the infection: ? Do not share personal care items, towels, or hot tubs with others. ? Avoid making skin contact with other people.  Keep all follow-up visits as told by your health care provider. This is important. Contact a health care provider if you have:  More redness, swelling, or pain around your abscess.  More fluid or blood coming from your abscess.  Warm skin around your abscess.  More pus or a bad smell coming from your abscess.  A fever.  Muscle aches.  Chills or a general ill feeling. Get help right away if you:  Have severe pain.  See red streaks on your skin spreading away from the abscess. Summary  A skin abscess is an infected area on or under your skin that contains a collection of pus and other material.  A small abscess that drains on its own may not need treatment.  Treatment for larger abscesses may include having a procedure to drain the abscess and taking an antibiotic. This information is not intended to replace advice given to you by your health care provider. Make sure you discuss any questions you have with your health care provider. Document Released: 10/08/2004 Document Revised: 04/21/2018 Document  Reviewed: 02/11/2017 Elsevier Patient Education  2020 [employer].   Incision and Drainage Incision and drainage is a surgical procedure to open and drain a fluid-filled sac. The sac may be filled with pus, mucus, or blood. Examples of fluid-filled sacs that may need surgical drainage include cysts, skin infections (abscesses), and red lumps that develop from a ruptured cyst or a small abscess (boils). You may need this procedure if the affected area is large, painful, infected, or not healing well. Tell a health care provider about:  Any allergies you have.  All medicines you are taking, including vitamins, herbs, eye drops, creams, and over-the-counter medicines.  Any problems you or family members have had with anesthetic medicines.  Any blood disorders you have or have had.  Any surgeries you have had.  Any medical conditions you have or have had.  Whether you are pregnant or may be pregnant. What are the risks? Generally, this is a safe procedure. However, problems may  occur, including:  Infection.  Bleeding.  Allergic reactions to medicines.  Scarring.  The cyst or abscess returns.  Damage to nerves or vessels. What happens before the procedure? Medicine Ask your health care provider about:  Changing or stopping your regular medicines. This is especially important if you are taking diabetes medicines or blood thinners.  Taking medicines such as aspirin and ibuprofen. These medicines can thin your blood. Do not take these medicines unless your health care provider tells you to take them.  Taking over-the-counter medicines, vitamins, herbs, and supplements. Tests You may have an exam or testing. These may include:  Ultrasound or other imaging tests to see how large or deep the fluid-filled sac is.  Blood tests to check for infection. General instructions  Follow instructions from your health care provider about eating or drinking restrictions.  Plan to have  someone take you home from the hospital or clinic.  Ask your health care provider whether a responsible adult should care for you for at least 24 hours after you leave the hospital or clinic. This is important.  You may get a tetanus shot.  Ask your health care provider: ? How your surgery site will be marked or identified. ? What steps will be taken to help prevent infection. These may include:  Removing hair at the surgery site.  Washing skin with a germ-killing soap.  Receiving antibiotic medicine. What happens during the procedure?   An IV may be inserted into one of your veins.  You will be given one or more of the following: ? A medicine to help you relax (sedative). ? A medicine to numb the area (local anesthetic). ? A medicine to make you fall asleep (general anesthetic).  An incision will be made in the top of the fluid-filled sac.  Pus, blood, and mucus will be squeezed out, and a syringe or tube (drain) may be used to empty more fluid from the sac.  Your health care provider will do one of the following. He or she may: ? Leave the drain in place for several weeks to drain more fluid. ? Stitch open the edges of the incision to make a long-term opening for drainage (marsupialization).  The inside of the sac may be washed out (irrigated) with a sterile solution and packed with gauze before it is covered with a bandage (dressing).  Your health care provider do a culture test of the drainage fluid. The procedure may vary among health care providers and hospitals. What happens after the procedure?  Your blood pressure, heart rate, breathing rate, and blood oxygen level will be monitored often until you leave the hospital or clinic.  Do not drive for 24 hours if you were given a sedative during your procedure. Summary  Incision and drainage is a surgical procedure to open and drain a fluid-filled sac. The sac may be filled with pus, mucus, or blood.  Before the  procedure, you may be given antibiotic medicine to treat or help prevent infection.  During the procedure, an incision will be made in the top of the fluid-filled sac. Pus, blood, and mucus is squeezed out, and a syringe or tube (drain) may be used to empty more fluid from the sac.  The inside of the sac may be washed out (irrigated) with a sterile solution and packed with gauze before it is covered with a bandage (dressing). This information is not intended to replace advice given to you by your health care provider. Make sure you discuss  any questions you have with your health care provider. Document Released: 06/24/2000 Document Revised: 11/29/2017 Document Reviewed: 11/29/2017 Elsevier Patient Education  2020 [employer].

## 2018-11-10 DIAGNOSIS — Z961 Presence of intraocular lens: Secondary | ICD-10-CM | POA: Diagnosis not present

## 2018-11-10 DIAGNOSIS — E119 Type 2 diabetes mellitus without complications: Secondary | ICD-10-CM | POA: Diagnosis not present

## 2018-11-10 DIAGNOSIS — L723 Sebaceous cyst: Secondary | ICD-10-CM | POA: Diagnosis not present

## 2018-11-10 DIAGNOSIS — J3089 Other allergic rhinitis: Secondary | ICD-10-CM | POA: Diagnosis not present

## 2018-11-10 DIAGNOSIS — H524 Presbyopia: Secondary | ICD-10-CM | POA: Diagnosis not present

## 2018-11-10 DIAGNOSIS — J301 Allergic rhinitis due to pollen: Secondary | ICD-10-CM | POA: Diagnosis not present

## 2018-11-12 DIAGNOSIS — R222 Localized swelling, mass and lump, trunk: Secondary | ICD-10-CM | POA: Insufficient documentation

## 2018-11-12 NOTE — Progress Notes (Signed)
  Subjective  CC: Bump Under breast  [id number] [name] is a 68 y.o. female who presents today with the following problems:  Blackheads and lump under left breast Patient presenting today with complaints of left sided breast lump that is located just under the bra line.  Patient reports that there were blackheads around the area and it started as a small pinpoint and then quickly progressed into a larger nodule.  She denies any drainage from the area but does report pain with palpation.  She reports that she recently had a similar nodule that had to be drained on her left shoulder when she was previously seeing Dr. Juliane Lack.  Patient denies any fevers, chills, worsening or surrounding erythema.  Her last mammogram was in 2019 and was normal.  Her next mammogram is not due for another year.  No family or personal history of breast cancer.  Pertinent P/F/SHx: Hypertension, sebaceous cyst, overweight ROS: Pertinent ROS included in HPI. Objective  Physical Exam:  BP 118/68   Pulse 72   Wt 158 lb 9.6 oz (71.9 kg)   SpO2 100%   BMI 26.39 kg/m  General: Well-appearing female, appears younger than stated age.  No acute distress, nontoxic Integumentary: Small 2 cm nodule palpated in mid clavicular line under left breast.  There is no surrounding induration or erythema. I am able to isolate lesion by lifting up lesion and approximate my fingers underneath. There is no extension of lesion or appreciable sinus trac formation. There is not spontaneous draining from sight.   Assessment & Plan    Problem List Items Addressed This Visit      Other   Nodule of anterior chest wall    Given rapid development of this lesion and pain, less concerning for malignancy and more consistent with abscess versus cyst formation.  Prescribe Bactrim twice daily for 7 days.  Patient to return for I&D of abscess if there is no improvement.  Patient given return precautions to go to the ED if there is any worsening or if  she experiences any fevers.  Encouraged her to use a warm wet compress to encourage draining.        Other Visit Diagnoses    Encounter for screening colonoscopy    -  Primary   Relevant Orders   Ambulatory referral to Gastroenterology     Wilber Oliphant, M.D.  PGY-2  Family Medicine  [phone] 11/12/2018 8:37 PM

## 2018-11-12 NOTE — Assessment & Plan Note (Signed)
Given rapid development of this lesion and pain, less concerning for malignancy and more consistent with abscess versus cyst formation.  Prescribe Bactrim twice daily for 7 days.  Patient to return for I&D of abscess if there is no improvement.  Patient given return precautions to go to the ED if there is any worsening or if she experiences any fevers.  Encouraged her to use a warm wet compress to encourage draining.

## 2018-11-16 DIAGNOSIS — J301 Allergic rhinitis due to pollen: Secondary | ICD-10-CM | POA: Diagnosis not present

## 2018-11-16 DIAGNOSIS — J3089 Other allergic rhinitis: Secondary | ICD-10-CM | POA: Diagnosis not present

## 2018-11-18 ENCOUNTER — Ambulatory Visit (INDEPENDENT_AMBULATORY_CARE_PROVIDER_SITE_OTHER): Payer: Medicare Other | Admitting: Family Medicine

## 2018-11-18 ENCOUNTER — Other Ambulatory Visit: Payer: Self-pay

## 2018-11-18 ENCOUNTER — Encounter: Payer: Self-pay | Admitting: Family Medicine

## 2018-11-18 VITALS — BP 118/62 | HR 75 | Wt 157.2 lb

## 2018-11-18 DIAGNOSIS — G8929 Other chronic pain: Secondary | ICD-10-CM

## 2018-11-18 DIAGNOSIS — I10 Essential (primary) hypertension: Secondary | ICD-10-CM

## 2018-11-18 DIAGNOSIS — M5440 Lumbago with sciatica, unspecified side: Secondary | ICD-10-CM | POA: Diagnosis not present

## 2018-11-18 DIAGNOSIS — R222 Localized swelling, mass and lump, trunk: Secondary | ICD-10-CM

## 2018-11-18 DIAGNOSIS — E0840 Diabetes mellitus due to underlying condition with diabetic neuropathy, unspecified: Secondary | ICD-10-CM

## 2018-11-18 LAB — POCT GLYCOSYLATED HEMOGLOBIN (HGB A1C): HbA1c, POC (controlled diabetic range): 7.4 % — AB (ref 0.0–7.0)

## 2018-11-18 MED ORDER — TRAMADOL HCL 50 MG PO TABS: ORAL_TABLET | ORAL | 0 refills | Status: DC

## 2018-11-18 NOTE — Patient Instructions (Signed)
It was great to see you today! Thank you for letting me participate in your care!  Today, we discussed your blood sugar and your high blood pressure which are both improved and to goal! Please keep it up!  I will see you in three months.  Be well, Harolyn Rutherford, DO PGY-3, [facility]

## 2018-11-18 NOTE — Progress Notes (Signed)
Subjective: Chief Complaint  Patient presents with  . Annual Exam    HPI: [name] is a 68 y.o. presenting to clinic today to discuss the following:  T2DM Patient has improved A1c today at 7.4% which would be below her goal of 7.5%. No side effects and taking medications Metformin and Glipizide as prescribed. Also on a ARB for renal protection. Her morning fasting glucose in the past 3 months is reported to be between 87 and 167 with an average around 120s.   HTN Well controlled at office today. Patient is compliant with medications and not reporting any side effects. She is on Valsartan, Atenolol, and HCTZ. No headaches, vision changes, chest pain, or shortness of breath.   Sebacous Cyst Patient was seen several weeks ago for a abscess/cyst under her left breast and was started on Bactrim. She reports improvement in pain, swelling, and redness. It is no longer bothering her and not draining. It is not tender.    ROS noted in HPI.    Social History   Tobacco Use  Smoking Status Current Every Day Smoker  . Packs/day: 1.00  . Types: Cigarettes  Smokeless Tobacco Never Used    Objective: BP 118/62   Pulse 75   Wt 157 lb 3.2 oz (71.3 kg)   SpO2 98%   BMI 26.16 kg/m  Vitals and nursing notes reviewed  Physical Exam Gen: Alert and Oriented x 3, NAD HEENT: Normocephalic, atraumatic Chest Wall: Small erythematous, non-tender, non-warm 2x3cm area under the right breast, no active drainage. Ext: no clubbing, cyanosis, or edema Skin: warm, dry, intact, no rashes   Results for orders placed or performed in visit on 11/18/18 (from the past 72 hour(s))  HgB A1c     Status: Abnormal   Collection Time: 11/18/18  1:40 PM  Result Value Ref Range   Hemoglobin A1C     HbA1c POC (<> result, manual entry)     HbA1c, POC (prediabetic range)     HbA1c, POC (controlled diabetic range) 7.4 (A) 0.0 - 7.0 %    Assessment/Plan:  Controlled diabetes mellitus with  neurologic complication, without long-term current use of insulin (HCC) At goal today at 7.4% given age >38. BMET at next visit and repeat A1c in three months. - Cont Metformin 1000mg  BID - Cont Glipzide 10mg  BID - Consider adding GLP-1 agonist or SGLT-2 inhibitor if she goes above 8% again.  Chronic low back pain Refill for tramadol today. Has not requested a refill in almost a year.  Nodule of anterior chest wall Improved from last visit with anitibiotics.  Given no drainage, improvement in tenderness, swelling, and erythema will continue to monitor  Primary hypertension Continues to be well controlled. NO changes in therapy per this visit indicated. - Cont Atenolol 25mg  daily - Cont HCTZ 12.5mg  daily - Cont Valsartan 160mg  daily     PATIENT EDUCATION PROVIDED: See AVS    Diagnosis and plan along with any newly prescribed medication(s) were discussed in detail with this patient today. The patient verbalized understanding and agreed with the plan. Patient advised if symptoms worsen return to clinic or ER.   Health Maintainance: was seen by opthamology for DM exam  Orders Placed This Encounter  Procedures  . HgB A1c    Meds ordered this encounter  Medications  . traMADol (ULTRAM) 50 MG tablet    Sig: Use up to three times per day as needed    Dispense:  30 tablet  Refill:  0     Harolyn Rutherford, DO 11/18/2018, 1:49 PM PGY-3 [place]

## 2018-11-18 NOTE — Assessment & Plan Note (Signed)
Refill for tramadol today. Has not requested a refill in almost a year.

## 2018-11-18 NOTE — Assessment & Plan Note (Signed)
Continues to be well controlled. NO changes in therapy per this visit indicated. - Cont Atenolol 25mg  daily - Cont HCTZ 12.5mg  daily - Cont Valsartan 160mg  daily

## 2018-11-18 NOTE — Assessment & Plan Note (Signed)
Improved from last visit with anitibiotics.  Given no drainage, improvement in tenderness, swelling, and erythema will continue to monitor

## 2018-11-18 NOTE — Assessment & Plan Note (Signed)
At goal today at 7.4% given age >34. BMET at next visit and repeat A1c in three months. - Cont Metformin 1000mg  BID - Cont Glipzide 10mg  BID - Consider adding GLP-1 agonist or SGLT-2 inhibitor if she goes above 8% again.

## 2018-11-23 DIAGNOSIS — J301 Allergic rhinitis due to pollen: Secondary | ICD-10-CM | POA: Diagnosis not present

## 2018-11-23 DIAGNOSIS — J3089 Other allergic rhinitis: Secondary | ICD-10-CM | POA: Diagnosis not present

## 2018-11-29 ENCOUNTER — Encounter: Payer: Self-pay | Admitting: Podiatry

## 2018-11-29 ENCOUNTER — Other Ambulatory Visit: Payer: Self-pay

## 2018-11-29 ENCOUNTER — Ambulatory Visit: Payer: Medicare Other | Admitting: Podiatry

## 2018-11-29 DIAGNOSIS — B351 Tinea unguium: Secondary | ICD-10-CM

## 2018-11-29 DIAGNOSIS — M79674 Pain in right toe(s): Secondary | ICD-10-CM | POA: Diagnosis not present

## 2018-11-29 DIAGNOSIS — M79675 Pain in left toe(s): Secondary | ICD-10-CM | POA: Diagnosis not present

## 2018-11-29 NOTE — Patient Instructions (Signed)
Diabetes Mellitus and Foot Care Foot care is an important part of your health, especially when you have diabetes. Diabetes may cause you to have problems because of poor blood flow (circulation) to your feet and legs, which can cause your skin to:  Become thinner and drier.  Break more easily.  Heal more slowly.  Peel and crack. You may also have nerve damage (neuropathy) in your legs and feet, causing decreased feeling in them. This means that you may not notice minor injuries to your feet that could lead to more serious problems. Noticing and addressing any potential problems early is the best way to prevent future foot problems. How to care for your feet Foot hygiene  Wash your feet daily with warm water and mild soap. Do not use hot water. Then, pat your feet and the areas between your toes until they are completely dry. Do not soak your feet as this can dry your skin.  Trim your toenails straight across. Do not dig under them or around the cuticle. File the edges of your nails with an emery board or nail file.  Apply a moisturizing lotion or petroleum jelly to the skin on your feet and to dry, brittle toenails. Use lotion that does not contain alcohol and is unscented. Do not apply lotion between your toes. Shoes and socks  Wear clean socks or stockings every day. Make sure they are not too tight. Do not wear knee-high stockings since they may decrease blood flow to your legs.  Wear shoes that fit properly and have enough cushioning. Always look in your shoes before you put them on to be sure there are no objects inside.  To break in new shoes, wear them for just a few hours a day. This prevents injuries on your feet. Wounds, scrapes, corns, and calluses  Check your feet daily for blisters, cuts, bruises, sores, and redness. If you cannot see the bottom of your feet, use a mirror or ask someone for help.  Do not cut corns or calluses or try to remove them with medicine.  If you  find a minor scrape, cut, or break in the skin on your feet, keep it and the skin around it clean and dry. You may clean these areas with mild soap and water. Do not clean the area with peroxide, alcohol, or iodine.  If you have a wound, scrape, corn, or callus on your foot, look at it several times a day to make sure it is healing and not infected. Check for: ? Redness, swelling, or pain. ? Fluid or blood. ? Warmth. ? Pus or a bad smell. General instructions  Do not cross your legs. This may decrease blood flow to your feet.  Do not use heating pads or hot water bottles on your feet. They may burn your skin. If you have lost feeling in your feet or legs, you may not know this is happening until it is too late.  Protect your feet from hot and cold by wearing shoes, such as at the beach or on hot pavement.  Schedule a complete foot exam at least once a year (annually) or more often if you have foot problems. If you have foot problems, report any cuts, sores, or bruises to your health care provider immediately. Contact a health care provider if:  You have a medical condition that increases your risk of infection and you have any cuts, sores, or bruises on your feet.  You have an injury that is not   healing.  You have redness on your legs or feet.  You feel burning or tingling in your legs or feet.  You have pain or cramps in your legs and feet.  Your legs or feet are numb.  Your feet always feel cold.  You have pain around a toenail. Get help right away if:  You have a wound, scrape, corn, or callus on your foot and: ? You have pain, swelling, or redness that gets worse. ? You have fluid or blood coming from the wound, scrape, corn, or callus. ? Your wound, scrape, corn, or callus feels warm to the touch. ? You have pus or a bad smell coming from the wound, scrape, corn, or callus. ? You have a fever. ? You have a red line going up your leg. Summary  Check your feet every day  for cuts, sores, red spots, swelling, and blisters.  Moisturize feet and legs daily.  Wear shoes that fit properly and have enough cushioning.  If you have foot problems, report any cuts, sores, or bruises to your health care provider immediately.  Schedule a complete foot exam at least once a year (annually) or more often if you have foot problems. This information is not intended to replace advice given to you by your health care provider. Make sure you discuss any questions you have with your health care provider. Document Released: 12/27/1999 Document Revised: 02/10/2017 Document Reviewed: 01/31/2016 Elsevier Patient Education  2020 [employer]vier [employer]  

## 2018-11-30 DIAGNOSIS — J3089 Other allergic rhinitis: Secondary | ICD-10-CM | POA: Diagnosis not present

## 2018-11-30 DIAGNOSIS — J301 Allergic rhinitis due to pollen: Secondary | ICD-10-CM | POA: Diagnosis not present

## 2018-12-07 NOTE — Progress Notes (Signed)
Subjective:  [name] presents to clinic today with cc of  painful, thick, discolored, elongated toenails  of both feet that become tender and patient cannot cut because of thickness. Pain is aggravated when wearing enclosed shoe gear and relieved with periodic professional debridement.  Patient voices no new pedal concerns on today's visit.  Medications reviewed in chart.  Allergies  Allergen Reactions  . Alcohol Swabs [Isopropyl Alcohol] Nausea Only    Use peroxide instead  . Penicillins Other (See Comments)    Arm swelling  . Prednisone Other (See Comments)    Could not sleep  . Amoxicillin-Pot Clavulanate Other (See Comments)    She is unsure what reaction she had    Objective:  Physical Examination:  Vascular Examination: Capillary refill time immediate b/l.  Palpable DP/PT pulses b/l.  Digital hair absent b/l.   No edema noted b/l.  Skin temperature gradient WNL b/l.  Dermatological Examination: Skin with normal turgor, texture and tone b/l.  No open wounds b/l.  No interdigital macerations noted b/l.  Elongated, thick, discolored brittle toenails with subungual debris and pain on dorsal palpation of nailbeds 1-5 b/l.  Musculoskeletal Examination: Muscle strength 5/5 to all muscle groups b/l.  HAV with bunion deformity b/l.  No pain, crepitus or joint discomfort with active/passive ROM.  Neurological Examination: Sensation diminished b/l with 10 gram monofilament.  Vibratory sensation intact b/l.  Proprioceptive sensation intact b/l.  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 Toenails 1-5 b/l were debrided in length and girth without iatrogenic laceration. 2.  Continue soft, supportive shoe gear daily. 3.  Report any pedal injuries to medical professional. 4.  Follow up 3 months. 5.  Patient/POA to call should there be a question/concern in there interim.

## 2018-12-13 ENCOUNTER — Other Ambulatory Visit: Payer: Self-pay | Admitting: Family Medicine

## 2018-12-13 DIAGNOSIS — J3089 Other allergic rhinitis: Secondary | ICD-10-CM | POA: Diagnosis not present

## 2018-12-13 DIAGNOSIS — J301 Allergic rhinitis due to pollen: Secondary | ICD-10-CM | POA: Diagnosis not present

## 2018-12-13 DIAGNOSIS — G8929 Other chronic pain: Secondary | ICD-10-CM

## 2018-12-13 DIAGNOSIS — M5440 Lumbago with sciatica, unspecified side: Secondary | ICD-10-CM

## 2018-12-22 ENCOUNTER — Other Ambulatory Visit: Payer: Self-pay | Admitting: Family Medicine

## 2018-12-22 ENCOUNTER — Telehealth: Payer: Self-pay | Admitting: Family Medicine

## 2018-12-22 DIAGNOSIS — R1310 Dysphagia, unspecified: Secondary | ICD-10-CM

## 2018-12-22 NOTE — Telephone Encounter (Signed)
Pt is calling and would like to know Dr. Garlan Fillers could place a referral for her to see Dr. Angela Nevin at [facility].  The fax number is [phone]

## 2018-12-22 NOTE — Progress Notes (Signed)
Patient requests referral specifically to [facility] for Dr. Angela Nevin.  This note is not being shared with the patient for the following reason

## 2018-12-23 DIAGNOSIS — J3089 Other allergic rhinitis: Secondary | ICD-10-CM | POA: Diagnosis not present

## 2018-12-23 DIAGNOSIS — R140 Abdominal distension (gaseous): Secondary | ICD-10-CM | POA: Diagnosis not present

## 2018-12-23 DIAGNOSIS — J301 Allergic rhinitis due to pollen: Secondary | ICD-10-CM | POA: Diagnosis not present

## 2018-12-23 DIAGNOSIS — R143 Flatulence: Secondary | ICD-10-CM | POA: Diagnosis not present

## 2018-12-26 ENCOUNTER — Other Ambulatory Visit: Payer: Self-pay

## 2018-12-26 DIAGNOSIS — R140 Abdominal distension (gaseous): Secondary | ICD-10-CM | POA: Diagnosis not present

## 2018-12-26 DIAGNOSIS — I10 Essential (primary) hypertension: Secondary | ICD-10-CM

## 2018-12-27 MED ORDER — ATENOLOL 25 MG PO TABS: ORAL_TABLET | ORAL | 3 refills | Status: DC

## 2018-12-29 ENCOUNTER — Other Ambulatory Visit: Payer: Self-pay | Admitting: Internal Medicine

## 2018-12-29 DIAGNOSIS — I10 Essential (primary) hypertension: Secondary | ICD-10-CM

## 2018-12-30 DIAGNOSIS — J3089 Other allergic rhinitis: Secondary | ICD-10-CM | POA: Diagnosis not present

## 2018-12-30 DIAGNOSIS — J301 Allergic rhinitis due to pollen: Secondary | ICD-10-CM | POA: Diagnosis not present

## 2019-01-02 DIAGNOSIS — Z1231 Encounter for screening mammogram for malignant neoplasm of breast: Secondary | ICD-10-CM | POA: Diagnosis not present

## 2019-01-02 DIAGNOSIS — Z803 Family history of malignant neoplasm of breast: Secondary | ICD-10-CM | POA: Diagnosis not present

## 2019-01-03 DIAGNOSIS — J301 Allergic rhinitis due to pollen: Secondary | ICD-10-CM | POA: Diagnosis not present

## 2019-01-03 DIAGNOSIS — J3089 Other allergic rhinitis: Secondary | ICD-10-CM | POA: Diagnosis not present

## 2019-01-10 DIAGNOSIS — J301 Allergic rhinitis due to pollen: Secondary | ICD-10-CM | POA: Diagnosis not present

## 2019-01-10 DIAGNOSIS — J3089 Other allergic rhinitis: Secondary | ICD-10-CM | POA: Diagnosis not present

## 2019-01-12 DIAGNOSIS — J3081 Allergic rhinitis due to animal (cat) (dog) hair and dander: Secondary | ICD-10-CM | POA: Diagnosis not present

## 2019-01-12 DIAGNOSIS — J301 Allergic rhinitis due to pollen: Secondary | ICD-10-CM | POA: Diagnosis not present

## 2019-01-18 DIAGNOSIS — J3089 Other allergic rhinitis: Secondary | ICD-10-CM | POA: Diagnosis not present

## 2019-01-18 DIAGNOSIS — J301 Allergic rhinitis due to pollen: Secondary | ICD-10-CM | POA: Diagnosis not present

## 2019-01-26 ENCOUNTER — Other Ambulatory Visit: Payer: Self-pay | Admitting: Family Medicine

## 2019-01-26 DIAGNOSIS — G8929 Other chronic pain: Secondary | ICD-10-CM

## 2019-01-26 DIAGNOSIS — M5440 Lumbago with sciatica, unspecified side: Secondary | ICD-10-CM

## 2019-01-27 DIAGNOSIS — J301 Allergic rhinitis due to pollen: Secondary | ICD-10-CM | POA: Diagnosis not present

## 2019-01-27 DIAGNOSIS — J3089 Other allergic rhinitis: Secondary | ICD-10-CM | POA: Diagnosis not present

## 2019-01-31 DIAGNOSIS — R140 Abdominal distension (gaseous): Secondary | ICD-10-CM | POA: Diagnosis not present

## 2019-01-31 DIAGNOSIS — R143 Flatulence: Secondary | ICD-10-CM | POA: Diagnosis not present

## 2019-01-31 DIAGNOSIS — R634 Abnormal weight loss: Secondary | ICD-10-CM | POA: Diagnosis not present

## 2019-02-01 ENCOUNTER — Ambulatory Visit (INDEPENDENT_AMBULATORY_CARE_PROVIDER_SITE_OTHER): Payer: Medicare Other | Admitting: Family Medicine

## 2019-02-01 ENCOUNTER — Other Ambulatory Visit: Payer: Self-pay

## 2019-02-01 VITALS — BP 120/70 | HR 81 | Wt 162.0 lb

## 2019-02-01 DIAGNOSIS — E0840 Diabetes mellitus due to underlying condition with diabetic neuropathy, unspecified: Secondary | ICD-10-CM

## 2019-02-01 DIAGNOSIS — J301 Allergic rhinitis due to pollen: Secondary | ICD-10-CM | POA: Diagnosis not present

## 2019-02-01 DIAGNOSIS — I10 Essential (primary) hypertension: Secondary | ICD-10-CM

## 2019-02-01 DIAGNOSIS — J3089 Other allergic rhinitis: Secondary | ICD-10-CM | POA: Diagnosis not present

## 2019-02-01 LAB — POCT GLYCOSYLATED HEMOGLOBIN (HGB A1C): HbA1c, POC (controlled diabetic range): 8 % — AB (ref 0.0–7.0)

## 2019-02-01 MED ORDER — VALSARTAN 160 MG PO TABS: 160.0000 mg | ORAL_TABLET | Freq: Every day | ORAL | 3 refills | Status: DC

## 2019-02-01 NOTE — Progress Notes (Signed)
     Subjective: Chief Complaint  Patient presents with  . Diabetes     HPI: [name] is a 69 y.o. presenting to clinic today to discuss the following:  DM2 Follow up [name] is here for routine follow up for her diabetes. She states overall she has felt well and denies any hypoglycemic episodes. She has been taking her medications without any complications or side effects. Recently, her husband who had alzheimer's and multiple chronic diseases passed away. We discussed this at length during this appointment and she is sad and lonely but denies feeling depressed, hopeless, or loss of energy, or appetite. She is still getting out and talking with friends while practicing social distancing.     ROS noted in HPI.    Social History   Tobacco Use  Smoking Status Current Every Day Smoker  . Packs/day: 1.00  . Types: Cigarettes  Smokeless Tobacco Never Used    Objective: BP 120/70   Pulse 81   Wt 162 lb (73.5 kg)   SpO2 97%   BMI 26.96 kg/m  Vitals and nursing notes reviewed  Physical Exam Gen: Alert and Oriented x 3, NAD CV: RRR, no murmurs, normal S1, S2 split Resp: CTAB, no wheezing, rales, or rhonchi, comfortable work of breathing Abd: non-distended, non-tender, soft, +bs in all four quadrants Ext: no clubbing, cyanosis, or edema Neuro: No gross deficits Skin: warm, dry, intact, no rashes  Results for orders placed or performed in visit on 02/01/19 (from the past 72 hour(s))  HgB A1c     Status: Abnormal   Collection Time: 02/01/19 10:55 AM  Result Value Ref Range   Hemoglobin A1C     HbA1c POC (<> result, manual entry)     HbA1c, POC (prediabetic range)     HbA1c, POC (controlled diabetic range) 8.0 (A) 0.0 - 7.0 %    Assessment/Plan:  Controlled diabetes mellitus with neurologic complication, without long-term current use of insulin (HCC) HgbA1c is slightly elevated and not to goal of 7.5% however given her age of above 72 8.0% is not unreasonable.  We discussed our goal to stay inbetween 7.5 and 8%. - cont current medication regimen. - F/u in 3 months - Perform foot exam at next appointment - Get Tetanus and PNA vaccines at next appointment   PATIENT EDUCATION PROVIDED: See AVS    Diagnosis and plan along with any newly prescribed medication(s) were discussed in detail with this patient today. The patient verbalized understanding and agreed with the plan. Patient advised if symptoms worsen return to clinic or ER.     Orders Placed This Encounter  Procedures  . HgB A1c    Meds ordered this encounter  Medications  . DISCONTD: valsartan (DIOVAN) 160 MG tablet    Sig: Take 1 tablet (160 mg total) by mouth daily.    Dispense:  90 tablet    Refill:  3  . valsartan (DIOVAN) 160 MG tablet    Sig: Take 1 tablet (160 mg total) by mouth daily.    Dispense:  90 tablet    Refill:  [place], DO 02/01/2019, 11:32 AM PGY-3 [place]

## 2019-02-03 DIAGNOSIS — J3089 Other allergic rhinitis: Secondary | ICD-10-CM | POA: Diagnosis not present

## 2019-02-03 DIAGNOSIS — J301 Allergic rhinitis due to pollen: Secondary | ICD-10-CM | POA: Diagnosis not present

## 2019-02-06 DIAGNOSIS — J301 Allergic rhinitis due to pollen: Secondary | ICD-10-CM | POA: Diagnosis not present

## 2019-02-06 DIAGNOSIS — J3089 Other allergic rhinitis: Secondary | ICD-10-CM | POA: Diagnosis not present

## 2019-02-06 NOTE — Assessment & Plan Note (Signed)
HgbA1c is slightly elevated and not to goal of 7.5% however given her age of above 86 8.0% is not unreasonable. We discussed our goal to stay inbetween 7.5 and 8%. - cont current medication regimen. - F/u in 3 months - Perform foot exam at next appointment - Get Tetanus and PNA vaccines at next appointment

## 2019-02-06 NOTE — Patient Instructions (Signed)
Patient declined  

## 2019-02-10 DIAGNOSIS — J301 Allergic rhinitis due to pollen: Secondary | ICD-10-CM | POA: Diagnosis not present

## 2019-02-10 DIAGNOSIS — J3089 Other allergic rhinitis: Secondary | ICD-10-CM | POA: Diagnosis not present

## 2019-02-15 DIAGNOSIS — J3089 Other allergic rhinitis: Secondary | ICD-10-CM | POA: Diagnosis not present

## 2019-02-15 DIAGNOSIS — J301 Allergic rhinitis due to pollen: Secondary | ICD-10-CM | POA: Diagnosis not present

## 2019-02-24 DIAGNOSIS — J3089 Other allergic rhinitis: Secondary | ICD-10-CM | POA: Diagnosis not present

## 2019-02-24 DIAGNOSIS — J301 Allergic rhinitis due to pollen: Secondary | ICD-10-CM | POA: Diagnosis not present

## 2019-02-28 ENCOUNTER — Other Ambulatory Visit: Payer: Self-pay | Admitting: Family Medicine

## 2019-02-28 DIAGNOSIS — G8929 Other chronic pain: Secondary | ICD-10-CM

## 2019-02-28 DIAGNOSIS — R140 Abdominal distension (gaseous): Secondary | ICD-10-CM | POA: Diagnosis not present

## 2019-02-28 DIAGNOSIS — E739 Lactose intolerance, unspecified: Secondary | ICD-10-CM | POA: Diagnosis not present

## 2019-03-01 ENCOUNTER — Other Ambulatory Visit: Payer: Self-pay

## 2019-03-01 ENCOUNTER — Ambulatory Visit: Payer: Medicare Other | Admitting: Podiatry

## 2019-03-01 ENCOUNTER — Encounter: Payer: Self-pay | Admitting: Podiatry

## 2019-03-01 DIAGNOSIS — J301 Allergic rhinitis due to pollen: Secondary | ICD-10-CM | POA: Diagnosis not present

## 2019-03-01 DIAGNOSIS — E1140 Type 2 diabetes mellitus with diabetic neuropathy, unspecified: Secondary | ICD-10-CM | POA: Diagnosis not present

## 2019-03-01 DIAGNOSIS — M2010 Hallux valgus (acquired), unspecified foot: Secondary | ICD-10-CM

## 2019-03-01 DIAGNOSIS — M2012 Hallux valgus (acquired), left foot: Secondary | ICD-10-CM

## 2019-03-01 DIAGNOSIS — M79674 Pain in right toe(s): Secondary | ICD-10-CM | POA: Diagnosis not present

## 2019-03-01 DIAGNOSIS — B351 Tinea unguium: Secondary | ICD-10-CM | POA: Diagnosis not present

## 2019-03-01 DIAGNOSIS — J3089 Other allergic rhinitis: Secondary | ICD-10-CM | POA: Diagnosis not present

## 2019-03-01 DIAGNOSIS — E119 Type 2 diabetes mellitus without complications: Secondary | ICD-10-CM | POA: Diagnosis not present

## 2019-03-01 DIAGNOSIS — M2011 Hallux valgus (acquired), right foot: Secondary | ICD-10-CM

## 2019-03-01 DIAGNOSIS — M79675 Pain in left toe(s): Secondary | ICD-10-CM

## 2019-03-01 NOTE — Progress Notes (Signed)
Subjective: [name] presents today for follow up of preventative diabetic foot care, for diabetic foot evaluation and painful mycotic nails b/l that are difficult to trim. Pain interferes with ambulation. Aggravating factors include wearing enclosed shoe gear. Pain is relieved with periodic professional debridement.  She still takes gabapentin for her neuropathy.   Patient states she lost her husband in January.    She voices no new pedal problems on today's visit.  Allergies  Allergen Reactions  . Alcohol Swabs [Isopropyl Alcohol] Nausea Only    Use peroxide instead  . Penicillins Other (See Comments)    Arm swelling  . Prednisone Other (See Comments)    Could not sleep  . Amoxicillin-Pot Clavulanate Other (See Comments)    She is unsure what reaction she had     Objective: There were no vitals filed for this visit.  Vascular Examination:  Capillary refill time to digits immediate b/l, palpable DP pulses b/l, palpable PT pulses b/l, pedal hair absent b/l and skin temperature gradient within normal limits b/l  Dermatological Examination: Pedal skin with normal turgor, texture and tone bilaterally, no open wounds bilaterally, no interdigital macerations bilaterally and toenails 1-5 b/l elongated, dystrophic, thickened, crumbly with subungual debris  Musculoskeletal: Normal muscle strength 5/5 to all lower extremity muscle groups bilaterally, no pain crepitus or joint limitation noted with ROM b/l, bunion deformity noted b/l and patient ambulates independent of any assistive aids  Neurological: Protective sensation intact 5/5 intact bilaterally with 10g monofilament b/l and vibratory sensation intact b/l  Assessment: 1. Pain due to onychomycosis of toenails of both feet   2. Acquired hallux valgus, unspecified laterality   3. Encounter for diabetic foot exam ([place])   4. Type 2 diabetes, controlled, with neuropathy (HCC)   5. Hallux valgus, acquired, bilateral      Plan: Diabetic foot examination performed on today's visit. -Continue diabetic foot care principles. Literature dispensed on today.  -Toenails 1-5 b/l were debrided in length and girth without iatrogenic bleeding. -Patient to continue soft, supportive shoe gear daily. -Patient to report any pedal injuries to medical professional immediately. -Patient/POA to call should there be question/concern in the interim.  Return in about 3 months (around 05/29/2019) for diabetic nail trim.

## 2019-03-01 NOTE — Patient Instructions (Signed)
Diabetes Mellitus and Foot Care Foot care is an important part of your health, especially when you have diabetes. Diabetes may cause you to have problems because of poor blood flow (circulation) to your feet and legs, which can cause your skin to:  Become thinner and drier.  Break more easily.  Heal more slowly.  Peel and crack. You may also have nerve damage (neuropathy) in your legs and feet, causing decreased feeling in them. This means that you may not notice minor injuries to your feet that could lead to more serious problems. Noticing and addressing any potential problems early is the best way to prevent future foot problems. How to care for your feet Foot hygiene  Wash your feet daily with warm water and mild soap. Do not use hot water. Then, pat your feet and the areas between your toes until they are completely dry. Do not soak your feet as this can dry your skin.  Trim your toenails straight across. Do not dig under them or around the cuticle. File the edges of your nails with an emery board or nail file.  Apply a moisturizing lotion or petroleum jelly to the skin on your feet and to dry, brittle toenails. Use lotion that does not contain alcohol and is unscented. Do not apply lotion between your toes. Shoes and socks  Wear clean socks or stockings every day. Make sure they are not too tight. Do not wear knee-high stockings since they may decrease blood flow to your legs.  Wear shoes that fit properly and have enough cushioning. Always look in your shoes before you put them on to be sure there are no objects inside.  To break in new shoes, wear them for just a few hours a day. This prevents injuries on your feet. Wounds, scrapes, corns, and calluses  Check your feet daily for blisters, cuts, bruises, sores, and redness. If you cannot see the bottom of your feet, use a mirror or ask someone for help.  Do not cut corns or calluses or try to remove them with medicine.  If you  find a minor scrape, cut, or break in the skin on your feet, keep it and the skin around it clean and dry. You may clean these areas with mild soap and water. Do not clean the area with peroxide, alcohol, or iodine.  If you have a wound, scrape, corn, or callus on your foot, look at it several times a day to make sure it is healing and not infected. Check for: ? Redness, swelling, or pain. ? Fluid or blood. ? Warmth. ? Pus or a bad smell. General instructions  Do not cross your legs. This may decrease blood flow to your feet.  Do not use heating pads or hot water bottles on your feet. They may burn your skin. If you have lost feeling in your feet or legs, you may not know this is happening until it is too late.  Protect your feet from hot and cold by wearing shoes, such as at the beach or on hot pavement.  Schedule a complete foot exam at least once a year (annually) or more often if you have foot problems. If you have foot problems, report any cuts, sores, or bruises to your health care provider immediately. Contact a health care provider if:  You have a medical condition that increases your risk of infection and you have any cuts, sores, or bruises on your feet.  You have an injury that is not   healing.  You have redness on your legs or feet.  You feel burning or tingling in your legs or feet.  You have pain or cramps in your legs and feet.  Your legs or feet are numb.  Your feet always feel cold.  You have pain around a toenail. Get help right away if:  You have a wound, scrape, corn, or callus on your foot and: ? You have pain, swelling, or redness that gets worse. ? You have fluid or blood coming from the wound, scrape, corn, or callus. ? Your wound, scrape, corn, or callus feels warm to the touch. ? You have pus or a bad smell coming from the wound, scrape, corn, or callus. ? You have a fever. ? You have a red line going up your leg. Summary  Check your feet every day  for cuts, sores, red spots, swelling, and blisters.  Moisturize feet and legs daily.  Wear shoes that fit properly and have enough cushioning.  If you have foot problems, report any cuts, sores, or bruises to your health care provider immediately.  Schedule a complete foot exam at least once a year (annually) or more often if you have foot problems. This information is not intended to replace advice given to you by your health care provider. Make sure you discuss any questions you have with your health care provider. Document Revised: 09/21/2018 Document Reviewed: 01/31/2016 Elsevier Patient Education  2020 [employer] Inc.  

## 2019-03-10 ENCOUNTER — Encounter: Payer: Self-pay | Admitting: Internal Medicine

## 2019-03-10 ENCOUNTER — Ambulatory Visit: Payer: Medicare Other | Admitting: Internal Medicine

## 2019-03-10 ENCOUNTER — Other Ambulatory Visit: Payer: Self-pay

## 2019-03-10 VITALS — BP 114/66 | HR 79 | Ht 65.0 in | Wt 157.0 lb

## 2019-03-10 DIAGNOSIS — D649 Anemia, unspecified: Secondary | ICD-10-CM | POA: Diagnosis not present

## 2019-03-10 DIAGNOSIS — I10 Essential (primary) hypertension: Secondary | ICD-10-CM

## 2019-03-10 DIAGNOSIS — I471 Supraventricular tachycardia, unspecified: Secondary | ICD-10-CM

## 2019-03-10 DIAGNOSIS — R5383 Other fatigue: Secondary | ICD-10-CM | POA: Diagnosis not present

## 2019-03-10 LAB — BASIC METABOLIC PANEL
BUN/Creatinine Ratio: 15 (ref 12–28)
BUN: 10 mg/dL (ref 8–27)
CO2: 24 mmol/L (ref 20–29)
Calcium: 9.6 mg/dL (ref 8.7–10.3)
Chloride: 100 mmol/L (ref 96–106)
Creatinine, Ser: 0.65 mg/dL (ref 0.57–1.00)
GFR calc Af Amer: 107 mL/min/{1.73_m2} (ref >59)
GFR calc non Af Amer: 93 mL/min/{1.73_m2} (ref >59)
Glucose: 226 mg/dL — ABNORMAL HIGH (ref 65–99)
Potassium: 3.4 mmol/L — ABNORMAL LOW (ref 3.5–5.2)
Sodium: 139 mmol/L (ref 134–144)

## 2019-03-10 LAB — CBC WITH DIFFERENTIAL/PLATELET
Basophils Absolute: 0 10*3/uL (ref 0.0–0.2)
Basos: 1 %
EOS (ABSOLUTE): 0.2 10*3/uL (ref 0.0–0.4)
Eos: 2 %
Hematocrit: 39.4 % (ref 34.0–46.6)
Hemoglobin: 13.8 g/dL (ref 11.1–15.9)
Immature Grans (Abs): 0 10*3/uL (ref 0.0–0.1)
Immature Granulocytes: 0 %
Lymphocytes Absolute: 4.2 10*3/uL — ABNORMAL HIGH (ref 0.7–3.1)
Lymphs: 50 %
MCH: 32.5 pg (ref 26.6–33.0)
MCHC: 35 g/dL (ref 31.5–35.7)
MCV: 93 fL (ref 79–97)
Monocytes Absolute: 0.4 10*3/uL (ref 0.1–0.9)
Monocytes: 5 %
Neutrophils Absolute: 3.5 10*3/uL (ref 1.4–7.0)
Neutrophils: 42 %
Platelets: 269 10*3/uL (ref 150–450)
RBC: 4.24 x10E6/uL (ref 3.77–5.28)
RDW: 12.6 % (ref 11.7–15.4)
WBC: 8.4 10*3/uL (ref 3.4–10.8)

## 2019-03-10 MED ORDER — VALSARTAN 80 MG PO TABS: 80.0000 mg | ORAL_TABLET | Freq: Every day | ORAL | 3 refills | Status: DC

## 2019-03-10 NOTE — Patient Instructions (Addendum)
Medication Instructions:  Your physician has recommended you make the following change in your medication:   You will decrease your Valsartan 160 mg-  Take 1/2 tablet (80 mg) by mouth daily.  When you run out of your valsartan 160 mg tablets-call [facility] for a refill.  Your new prescription will be for valsartan 80 mg one tablet by mouth daily.  Labwork: You will get lab work today:  CBC with diff, BMP  Testing/Procedures: None ordered.  Follow-Up: Your physician wants you to follow-up in: 6 months with Dr. Lovena Le.   You will receive a reminder letter in the mail two months in advance. If you don't receive a letter, please call our office to schedule the follow-up appointment.  Any Other Special Instructions Will Be Listed Below (If Applicable).  If you need a refill on your cardiac medications before your next appointment, please call your pharmacy.

## 2019-03-10 NOTE — Progress Notes (Signed)
HPI [name] returns today for followup of SVT and HTN. She is a pleasant 69 yo woman with a h/o above, and ongoing tobacco abuse. She has been losing weight, especially since losing her husband who died of an aneurysm several weeks ago. She denies chest pain or sob. No edema. Her appetite has been down since her husband died. Allergies  Allergen Reactions  . Alcohol Swabs [Isopropyl Alcohol] Nausea Only    Use peroxide instead  . Penicillins Other (See Comments)    Arm swelling  . Prednisone Other (See Comments)    Could not sleep  . Amoxicillin-Pot Clavulanate Other (See Comments)    She is unsure what reaction she had     Current Outpatient Medications  Medication Sig Dispense Refill  . acetaminophen (TYLENOL) 325 MG tablet Take 2 tablets (650 mg total) by mouth every 6 (six) hours as needed for moderate pain. 60 tablet 1  . aspirin 81 MG chewable tablet Chew 1 tablet (81 mg total) by mouth daily. 100 tablet 6  . atenolol (TENORMIN) 25 MG tablet TAKE ONE TABLET BY MOUTH DAILY 90 tablet 1  . atorvastatin (LIPITOR) 40 MG tablet Take 1 tablet (40 mg total) by mouth daily. 90 tablet 3  . baclofen (LIORESAL) 10 MG tablet Take 1 tablet (10 mg total) by mouth daily as needed for muscle spasms. 30 tablet 1  . benzonatate (TESSALON) 200 MG capsule TAKE 1 CAPSULE BY MOUTH TWICE DAILY AS NEEDED FOR COUGH 20 capsule 0  . citalopram (CELEXA) 20 MG tablet Take 1 tablet (20 mg total) by mouth daily. 90 tablet 3  . EPINEPHrine 0.3 mg/0.3 mL IJ SOAJ injection USE AS DIRECTED AS NEEDED FOR SYSTEMIC REACTION    . gabapentin (NEURONTIN) 300 MG capsule TAKE 3 CAPSULES BY MOUTH TWICE A DAY AS NEEDED 540 capsule 2  . glipiZIDE (GLUCOTROL) 10 MG tablet TAKE ONE TABLET BY MOUTH TWICE A DAY BEFORE A MEAL 180 tablet 3  . glucose blood test strip Use as instructed 100 each 12  . hydrochlorothiazide (MICROZIDE) 12.5 MG capsule Take 1 capsule (12.5 mg total) by mouth daily. 90 capsule 3  .  hyoscyamine (LEVBID) 0.375 MG 12 hr tablet Take by mouth.    . Lancets (ONETOUCH ULTRASOFT) lancets Use as directed to test glucose 3/day.  Dx code: E11.9 100 each 5  . magnesium oxide (MAG-OX) 400 MG tablet Take by mouth.    . meclizine (ANTIVERT) 25 MG tablet Take 1-2 tablets (25-50 mg total) by mouth 2 (two) times daily as needed for dizziness. if having symptoms.  May repeat in 12 hours later. 30 tablet 2  . metFORMIN (GLUCOPHAGE) 1000 MG tablet Take 1 tablet (1,000 mg total) by mouth 2 (two) times daily with a meal. 180 tablet 3  . Multiple Vitamin (MULTIVITAMIN) tablet Take 1 tablet by mouth daily.    . Omega-3 Fatty Acids (FISH OIL PO) Take 300 mg by mouth daily.    . pantoprazole (PROTONIX) 40 MG tablet Take 1 tablet (40 mg total) by mouth daily. 90 tablet 2  . PNEUMOVAX 23 25 MCG/0.5ML injection     . polyethylene glycol (MIRALAX / GLYCOLAX) packet Take 17 g by mouth every other day. 14 each 1  . traMADol (ULTRAM) 50 MG tablet TAKE 1 TABLET BY MOUTH UP TO THREE TIMES DAILY AS NEEDED 30 tablet 0  . valsartan (DIOVAN) 160 MG tablet Take 1 tablet (160 mg total) by mouth daily. 90 tablet 3  .  vitamin B-12 (CYANOCOBALAMIN) 1000 MCG tablet Take 1,000 mcg by mouth daily.    . vitamin E 400 UNIT capsule Take 400 Units by mouth daily.     No current facility-administered medications for this visit.     Past Medical History:  Diagnosis Date  . Allergy   . Anxiety   . AR (allergic rhinitis)   . Asthma   . Cataract   . CHF (congestive heart failure) ([place])   . Colon polyps    hyperplastic  . Disturbance of skin sensation    facial paresthesia; left  . DM2 (diabetes mellitus, type 2) ([place])   . Dyslipidemia   . Fatty liver   . GERD (gastroesophageal reflux disease)   . Hemorrhoids   . Hyperlipidemia   . Hypertension    essential, benign   . Insomnia   . Menopausal syndrome   . Palpitations    hx  . Routine general medical examination at a health care facility   . Screening for  malignant neoplasm of the cervix   . Skin lesion   . SVT (supraventricular tachycardia) ([place])   . Tobacco abuse   . Weakness     ROS:   All systems reviewed and negative except as noted in the HPI.   Past Surgical History:  Procedure Laterality Date  . ABDOMINAL HYSTERECTOMY  1990   For fibroids benign.  Still has ovaries  . ablation for SVT  9/09   Dr. Lovena Le  . APPENDECTOMY    . CATARACT EXTRACTION, BILATERAL    . CESAREAN SECTION    . COLONOSCOPY  2009  . dental extractions    . HEMORRHOID BANDING  2014   w/Dr.Medoff  . SIGMOIDOSCOPY  2004     Family History  Problem Relation Age of Onset  . Heart attack Father 51  . Hypertension Mother   . Stroke Mother   . Diabetes Mother   . Stomach cancer Mother   . Cancer Mother   . Diabetes Brother   . Coronary artery disease Other        female, 1st degree relative <50  . Stomach cancer Maternal Aunt   . Diabetes Brother   . Colon cancer Neg Hx   . Esophageal cancer Neg Hx      Social History   Socioeconomic History  . Marital status: Married    Spouse name: Not on file  . Number of children: 3  . Years of education: 9  . Highest education level: Some college, no degree  Occupational History  . Occupation: Retired    Comment: Dance movement psychotherapist  Tobacco Use  . Smoking status: Current Every Day Smoker    Packs/day: 1.00    Types: Cigarettes  . Smokeless tobacco: Never Used  Substance and Sexual Activity  . Alcohol use: Not Currently  . Drug use: No  . Sexual activity: Not Currently  Other Topics Concern  . Not on file  Social History Narrative   Married, does not get regular exercise. On disability.    2 miscarriages, one at 4 and one at 5 months; death at 1 day, unsure of cause.       Health Care POA: on file   Emergency Contact: Jeanene Erb (friend) [phone] Terri Piedra (friend's daughter) [phone]   End of Life Plan: Gave pt pamphlet   Lives in 2 story  apartment. Handrails on stairs. Smoke alarms, has throw rugs with backing on them. No grab bars in bathroom.   Who  lives with you: husband   Any pets: 0   Diet: pt has varied diet. Eats vegetables, some fruit, and meat.   Exercise: pt is disabled and does not have a regular exercise routine   Seatbelts: Pt reports wearing seatbelt when in vehicle   Sun Exposure/Protection: Pt reports wearing a hat, sunglasses and facial sunscreen when outside   Hobbies: shopping    Social Determinants of Health   Financial Resource Strain:   . Difficulty of Paying Living Expenses: Not on file  Food Insecurity:   . Worried About Charity fundraiser in the Last Year: Not on file  . Ran Out of Food in the Last Year: Not on file  Transportation Needs:   . Lack of Transportation (Medical): Not on file  . Lack of Transportation (Non-Medical): Not on file  Physical Activity:   . Days of Exercise per Week: Not on file  . Minutes of Exercise per Session: Not on file  Stress:   . Feeling of Stress : Not on file  Social Connections:   . Frequency of Communication with Friends and Family: Not on file  . Frequency of Social Gatherings with Friends and Family: Not on file  . Attends Religious Services: Not on file  . Active Member of Clubs or Organizations: Not on file  . Attends Archivist Meetings: Not on file  . Marital Status: Not on file  Intimate Partner Violence:   . Fear of Current or Ex-Partner: Not on file  . Emotionally Abused: Not on file  . Physically Abused: Not on file  . Sexually Abused: Not on file     BP 114/66   Pulse 79   Ht 5\' 5"  (1.651 m)   Wt 157 lb (71.2 kg)   BMI 26.13 kg/m   Physical Exam:  Well appearing NAD HEENT: Unremarkable Neck:  No JVD, no thyromegally Lymphatics:  No adenopathy Back:  No CVA tenderness Lungs:  Clear with no wheezes HEART:  Regular rate rhythm, no murmurs, no rubs, no clicks Abd:  soft, positive bowel sounds, no organomegally, no  rebound, no guarding Ext:  2 plus pulses, no edema, no cyanosis, no clubbing Skin:  No rashes no nodules Neuro:  CN II through XII intact, motor grossly intact  DEVICE  Normal device function.  See PaceArt for details.   Assess/Plan: 1. SVT - she has had no recurrent symptoms. She will continue her low dose atenolol. 2. HTN - her bp has gone down as she has lost weight. I have asked her to reduce her dose of valsartan from 160 to 80 mg daily. 3. Tobacco abuse - she is still smoking a PPD. I have encouraged her to reduce her dose.  Mikle Bosworth.D.

## 2019-03-15 DIAGNOSIS — J3089 Other allergic rhinitis: Secondary | ICD-10-CM | POA: Diagnosis not present

## 2019-03-15 DIAGNOSIS — J301 Allergic rhinitis due to pollen: Secondary | ICD-10-CM | POA: Diagnosis not present

## 2019-03-23 DIAGNOSIS — J3089 Other allergic rhinitis: Secondary | ICD-10-CM | POA: Diagnosis not present

## 2019-03-23 DIAGNOSIS — J301 Allergic rhinitis due to pollen: Secondary | ICD-10-CM | POA: Diagnosis not present

## 2019-03-30 ENCOUNTER — Telehealth: Payer: Self-pay

## 2019-03-30 NOTE — Telephone Encounter (Signed)
Patient calls nurse line stating she was told to d/c a medication at her January office visit with PCP. Patient stated, "I do not know why I am thinking about this now, however I just don't know." I didn't exactly see anything about d/c a medication in notes. Please advise.

## 2019-03-31 NOTE — Telephone Encounter (Signed)
Patient contacted and informed of no change in medications. Patient verbalized understanding.

## 2019-04-07 DIAGNOSIS — J301 Allergic rhinitis due to pollen: Secondary | ICD-10-CM | POA: Diagnosis not present

## 2019-04-07 DIAGNOSIS — J3089 Other allergic rhinitis: Secondary | ICD-10-CM | POA: Diagnosis not present

## 2019-04-11 DIAGNOSIS — J3089 Other allergic rhinitis: Secondary | ICD-10-CM | POA: Diagnosis not present

## 2019-04-11 DIAGNOSIS — J301 Allergic rhinitis due to pollen: Secondary | ICD-10-CM | POA: Diagnosis not present

## 2019-04-21 DIAGNOSIS — J301 Allergic rhinitis due to pollen: Secondary | ICD-10-CM | POA: Diagnosis not present

## 2019-04-21 DIAGNOSIS — J3089 Other allergic rhinitis: Secondary | ICD-10-CM | POA: Diagnosis not present

## 2019-04-28 DIAGNOSIS — J301 Allergic rhinitis due to pollen: Secondary | ICD-10-CM | POA: Diagnosis not present

## 2019-04-28 DIAGNOSIS — J3089 Other allergic rhinitis: Secondary | ICD-10-CM | POA: Diagnosis not present

## 2019-04-30 ENCOUNTER — Other Ambulatory Visit: Payer: Self-pay | Admitting: Family Medicine

## 2019-04-30 DIAGNOSIS — E0840 Diabetes mellitus due to underlying condition with diabetic neuropathy, unspecified: Secondary | ICD-10-CM

## 2019-05-02 ENCOUNTER — Other Ambulatory Visit: Payer: Self-pay | Admitting: Family Medicine

## 2019-05-02 DIAGNOSIS — G8929 Other chronic pain: Secondary | ICD-10-CM

## 2019-05-02 DIAGNOSIS — M5440 Lumbago with sciatica, unspecified side: Secondary | ICD-10-CM

## 2019-05-03 DIAGNOSIS — J3089 Other allergic rhinitis: Secondary | ICD-10-CM | POA: Diagnosis not present

## 2019-05-03 DIAGNOSIS — J301 Allergic rhinitis due to pollen: Secondary | ICD-10-CM | POA: Diagnosis not present

## 2019-05-10 ENCOUNTER — Ambulatory Visit (INDEPENDENT_AMBULATORY_CARE_PROVIDER_SITE_OTHER): Payer: Medicare Other | Admitting: Family Medicine

## 2019-05-10 ENCOUNTER — Other Ambulatory Visit: Payer: Self-pay

## 2019-05-10 ENCOUNTER — Encounter: Payer: Self-pay | Admitting: Family Medicine

## 2019-05-10 VITALS — BP 120/72 | HR 75 | Wt 160.0 lb

## 2019-05-10 DIAGNOSIS — Z23 Encounter for immunization: Secondary | ICD-10-CM

## 2019-05-10 DIAGNOSIS — J301 Allergic rhinitis due to pollen: Secondary | ICD-10-CM | POA: Diagnosis not present

## 2019-05-10 DIAGNOSIS — I10 Essential (primary) hypertension: Secondary | ICD-10-CM

## 2019-05-10 DIAGNOSIS — Z0000 Encounter for general adult medical examination without abnormal findings: Secondary | ICD-10-CM | POA: Diagnosis not present

## 2019-05-10 DIAGNOSIS — E0840 Diabetes mellitus due to underlying condition with diabetic neuropathy, unspecified: Secondary | ICD-10-CM

## 2019-05-10 DIAGNOSIS — J3089 Other allergic rhinitis: Secondary | ICD-10-CM | POA: Diagnosis not present

## 2019-05-10 LAB — POCT GLYCOSYLATED HEMOGLOBIN (HGB A1C): HbA1c, POC (controlled diabetic range): 7.9 % — AB (ref 0.0–7.0)

## 2019-05-10 NOTE — Progress Notes (Signed)
    SUBJECTIVE:   CHIEF COMPLAINT / HPI:   T2DM Follow Up Patient compliant with medications and continues to try and lower A1c with dietary changes. No hypoglycemic events, no syncope, near syncope, no dizziness. She is able to afford and obtain her medications and requests refills. Overall, she feels she is eating a little more healthier than last time.  HTN Well controlled today. No issues with her current medications. She did state she saw her heart doctor recently who decreased her valsartan from 160mg  to 80mg . She has tolerated the change well. No changes in vision, no headaches.  Health Maintenance Tetanus vaccine ordered and PNA vaccine given today  PERTINENT  PMH / PSH: T2DM, HLD, HTN,   OBJECTIVE:   BP 120/72   Pulse 75   Wt 160 lb (72.6 kg)   SpO2 97%   BMI 26.63 kg/m   Gen: NAD CV: RRR, no murmurs, normal S1, S2 split Resp: CTAB, no wheezing, rales, or rhonchi, comfortable work of breathing Ext: no clubbing, cyanosis, or edema  ASSESSMENT/PLAN:   Primary hypertension Continues to be well controlled with no side effects from her medications. Valsartan decreased from 160 to 80mg  by her cardiologist. - Consider obtaining BMET at next appointment since she did have decrease in valsartan dose - Cont Atenolol 25mg  daily - Cont HCTZ 12.5mg  daily - Cont Valsartan 80mg  daily  Controlled diabetes mellitus with neurologic complication, without long-term current use of insulin (HCC) A1c still slightly elevated above goal of 7.5%. Discussed with Dr. Fortunato Curling and originally I was thinking a goal of 7.5 - 8% was reasonable but a case could be made to get her below 7.5%. She does have history of atherosclerotic disease so would benefit from [place]. - Cont Glipizide 10mg  BID with meals - Consider adding Jardiance 10mg  daily at next A1c check if still above 7.5%     Nuala Alpha, DO [place]

## 2019-05-10 NOTE — Patient Instructions (Addendum)
It was great to see you today! Thank you for letting me participate in your care!  Today, we discussed your diabetes and blood pressure both of which are under good control.   I have ordered the Tetanus vaccine and the Shingrex vaccine for you. You can go to your local pharmacy and get them. I advise you space them out by several weeks. I have also ordered your bone density scan.   You received the PNA vaccine today.  Be well, Harolyn Rutherford, DO PGY-3, [facility]

## 2019-05-11 LAB — LIPID PANEL
Chol/HDL Ratio: 2.3 ratio (ref 0.0–4.4)
Cholesterol, Total: 103 mg/dL (ref 100–199)
HDL: 44 mg/dL (ref >39)
LDL Chol Calc (NIH): 42 mg/dL (ref 0–99)
Triglycerides: 85 mg/dL (ref 0–149)
VLDL Cholesterol Cal: 17 mg/dL (ref 5–40)

## 2019-05-12 NOTE — Assessment & Plan Note (Addendum)
Continues to be well controlled with no side effects from her medications. Valsartan decreased from 160 to 80mg  by her cardiologist. - Consider obtaining BMET at next appointment since she did have decrease in valsartan dose - Cont Atenolol 25mg  daily - Cont HCTZ 12.5mg  daily - Cont Valsartan 80mg  daily

## 2019-05-12 NOTE — Assessment & Plan Note (Signed)
A1c still slightly elevated above goal of 7.5%. Discussed with Dr. Fortunato Curling and originally I was thinking a goal of 7.5 - 8% was reasonable but a case could be made to get her below 7.5%. She does have history of atherosclerotic disease so would benefit from [place]. - Cont Glipizide 10mg  BID with meals - Consider adding Jardiance 10mg  daily at next A1c check if still above 7.5%

## 2019-05-16 DIAGNOSIS — J301 Allergic rhinitis due to pollen: Secondary | ICD-10-CM | POA: Diagnosis not present

## 2019-05-16 DIAGNOSIS — J3089 Other allergic rhinitis: Secondary | ICD-10-CM | POA: Diagnosis not present

## 2019-05-25 DIAGNOSIS — J301 Allergic rhinitis due to pollen: Secondary | ICD-10-CM | POA: Diagnosis not present

## 2019-05-25 DIAGNOSIS — J3089 Other allergic rhinitis: Secondary | ICD-10-CM | POA: Diagnosis not present

## 2019-05-26 ENCOUNTER — Telehealth: Payer: Self-pay

## 2019-05-26 NOTE — Telephone Encounter (Signed)
Patient calls nurse line stating she went to [facility] on [address] and they have "no record of vaccine administration for her." Per patient, she is to get Shingles and Tdap. I advised patient I would have PCP send these two vaccines in to her pharmacy.   Of note, patient did say she "thinks" she was given a hardcopy, however just moved and she "may" have lost them.

## 2019-05-29 ENCOUNTER — Other Ambulatory Visit: Payer: Self-pay | Admitting: Family Medicine

## 2019-05-29 DIAGNOSIS — G8929 Other chronic pain: Secondary | ICD-10-CM

## 2019-05-29 NOTE — Telephone Encounter (Signed)
Pt would like a refill on her Tramadol called in to [facility]. Also please send in prescriptions for her to get the Shingles shot and to have a Tdap at the same pharmacy. jw

## 2019-05-30 ENCOUNTER — Ambulatory Visit: Payer: Medicare Other | Admitting: Orthotics

## 2019-05-30 ENCOUNTER — Other Ambulatory Visit: Payer: Self-pay

## 2019-05-30 DIAGNOSIS — M2010 Hallux valgus (acquired), unspecified foot: Secondary | ICD-10-CM

## 2019-05-30 NOTE — Progress Notes (Signed)
Patient came in today to p/up functional foot orthotics.   The orthotics were assessed to both fit and function.  The F/O addressed the biomechanical issues/pathologies as intended, offering good longitudinal arch support, proper offloading, and foot support. There weren't any signs of discomfort or irritation.  The F/O fit properly in footwear with minimal trimming/adjustments. 

## 2019-05-31 ENCOUNTER — Encounter: Payer: Self-pay | Admitting: Podiatry

## 2019-05-31 ENCOUNTER — Ambulatory Visit: Payer: Medicare Other | Admitting: Podiatry

## 2019-05-31 ENCOUNTER — Other Ambulatory Visit: Payer: Self-pay

## 2019-05-31 DIAGNOSIS — M79675 Pain in left toe(s): Secondary | ICD-10-CM | POA: Diagnosis not present

## 2019-05-31 DIAGNOSIS — B351 Tinea unguium: Secondary | ICD-10-CM | POA: Diagnosis not present

## 2019-05-31 DIAGNOSIS — M79674 Pain in right toe(s): Secondary | ICD-10-CM | POA: Diagnosis not present

## 2019-05-31 DIAGNOSIS — E1140 Type 2 diabetes mellitus with diabetic neuropathy, unspecified: Secondary | ICD-10-CM

## 2019-05-31 NOTE — Patient Instructions (Signed)
Diabetes Mellitus and Foot Care Foot care is an important part of your health, especially when you have diabetes. Diabetes may cause you to have problems because of poor blood flow (circulation) to your feet and legs, which can cause your skin to:  Become thinner and drier.  Break more easily.  Heal more slowly.  Peel and crack. You may also have nerve damage (neuropathy) in your legs and feet, causing decreased feeling in them. This means that you may not notice minor injuries to your feet that could lead to more serious problems. Noticing and addressing any potential problems early is the best way to prevent future foot problems. How to care for your feet Foot hygiene  Wash your feet daily with warm water and mild soap. Do not use hot water. Then, pat your feet and the areas between your toes until they are completely dry. Do not soak your feet as this can dry your skin.  Trim your toenails straight across. Do not dig under them or around the cuticle. File the edges of your nails with an emery board or nail file.  Apply a moisturizing lotion or petroleum jelly to the skin on your feet and to dry, brittle toenails. Use lotion that does not contain alcohol and is unscented. Do not apply lotion between your toes. Shoes and socks  Wear clean socks or stockings every day. Make sure they are not too tight. Do not wear knee-high stockings since they may decrease blood flow to your legs.  Wear shoes that fit properly and have enough cushioning. Always look in your shoes before you put them on to be sure there are no objects inside.  To break in new shoes, wear them for just a few hours a day. This prevents injuries on your feet. Wounds, scrapes, corns, and calluses  Check your feet daily for blisters, cuts, bruises, sores, and redness. If you cannot see the bottom of your feet, use a mirror or ask someone for help.  Do not cut corns or calluses or try to remove them with medicine.  If you  find a minor scrape, cut, or break in the skin on your feet, keep it and the skin around it clean and dry. You may clean these areas with mild soap and water. Do not clean the area with peroxide, alcohol, or iodine.  If you have a wound, scrape, corn, or callus on your foot, look at it several times a day to make sure it is healing and not infected. Check for: ? Redness, swelling, or pain. ? Fluid or blood. ? Warmth. ? Pus or a bad smell. General instructions  Do not cross your legs. This may decrease blood flow to your feet.  Do not use heating pads or hot water bottles on your feet. They may burn your skin. If you have lost feeling in your feet or legs, you may not know this is happening until it is too late.  Protect your feet from hot and cold by wearing shoes, such as at the beach or on hot pavement.  Schedule a complete foot exam at least once a year (annually) or more often if you have foot problems. If you have foot problems, report any cuts, sores, or bruises to your health care provider immediately. Contact a health care provider if:  You have a medical condition that increases your risk of infection and you have any cuts, sores, or bruises on your feet.  You have an injury that is not   healing.  You have redness on your legs or feet.  You feel burning or tingling in your legs or feet.  You have pain or cramps in your legs and feet.  Your legs or feet are numb.  Your feet always feel cold.  You have pain around a toenail. Get help right away if:  You have a wound, scrape, corn, or callus on your foot and: ? You have pain, swelling, or redness that gets worse. ? You have fluid or blood coming from the wound, scrape, corn, or callus. ? Your wound, scrape, corn, or callus feels warm to the touch. ? You have pus or a bad smell coming from the wound, scrape, corn, or callus. ? You have a fever. ? You have a red line going up your leg. Summary  Check your feet every day  for cuts, sores, red spots, swelling, and blisters.  Moisturize feet and legs daily.  Wear shoes that fit properly and have enough cushioning.  If you have foot problems, report any cuts, sores, or bruises to your health care provider immediately.  Schedule a complete foot exam at least once a year (annually) or more often if you have foot problems. This information is not intended to replace advice given to you by your health care provider. Make sure you discuss any questions you have with your health care provider. Document Revised: 09/21/2018 Document Reviewed: 01/31/2016 Elsevier Patient Education  2020 [employer] Inc.  

## 2019-06-01 DIAGNOSIS — J3089 Other allergic rhinitis: Secondary | ICD-10-CM | POA: Diagnosis not present

## 2019-06-01 DIAGNOSIS — J301 Allergic rhinitis due to pollen: Secondary | ICD-10-CM | POA: Diagnosis not present

## 2019-06-08 NOTE — Progress Notes (Signed)
Subjective: [name] presents today preventative diabetic foot care and painful mycotic nails b/l that are difficult to trim. Pain interferes with ambulation. Aggravating factors include wearing enclosed shoe gear. Pain is relieved with periodic professional debridement  Nuala Alpha, DO is patient's PCP. Last visit was: 05/10/2019.  Past Medical History:  Diagnosis Date  . Allergy   . Anxiety   . AR (allergic rhinitis)   . Asthma   . Cataract   . CHF (congestive heart failure) ([place])   . Colon polyps    hyperplastic  . Disturbance of skin sensation    facial paresthesia; left  . DM2 (diabetes mellitus, type 2) ([place])   . Dyslipidemia   . Fatty liver   . GERD (gastroesophageal reflux disease)   . Hemorrhoids   . Hyperlipidemia   . Hypertension    essential, benign   . Insomnia   . Menopausal syndrome   . Palpitations    hx  . Routine general medical examination at a health care facility   . Screening for malignant neoplasm of the cervix   . Skin lesion   . SVT (supraventricular tachycardia) ([place])   . Tobacco abuse   . Weakness      Current Outpatient Medications on File Prior to Visit  Medication Sig Dispense Refill  . acetaminophen (TYLENOL) 325 MG tablet Take 2 tablets (650 mg total) by mouth every 6 (six) hours as needed for moderate pain. 60 tablet 1  . aspirin 81 MG chewable tablet Chew 1 tablet (81 mg total) by mouth daily. 100 tablet 6  . atenolol (TENORMIN) 25 MG tablet TAKE ONE TABLET BY MOUTH DAILY 90 tablet 1  . atorvastatin (LIPITOR) 40 MG tablet Take 1 tablet (40 mg total) by mouth daily. 90 tablet 3  . baclofen (LIORESAL) 10 MG tablet Take 1 tablet (10 mg total) by mouth daily as needed for muscle spasms. 30 tablet 1  . benzonatate (TESSALON) 200 MG capsule TAKE 1 CAPSULE BY MOUTH TWICE DAILY AS NEEDED FOR COUGH 20 capsule 0  . citalopram (CELEXA) 20 MG tablet Take 1 tablet (20 mg total) by mouth daily. 90 tablet 3  . EPINEPHrine 0.3 mg/0.3 mL IJ  SOAJ injection USE AS DIRECTED AS NEEDED FOR SYSTEMIC REACTION    . gabapentin (NEURONTIN) 300 MG capsule TAKE 3 CAPSULES BY MOUTH TWICE A DAY AS NEEDED 540 capsule 2  . glipiZIDE (GLUCOTROL) 10 MG tablet TAKE ONE TABLET BY MOUTH TWICE A DAY BEFORE A MEAL 180 tablet 3  . glucose blood test strip Use as instructed 100 each 12  . hydrochlorothiazide (MICROZIDE) 12.5 MG capsule Take 1 capsule (12.5 mg total) by mouth daily. 90 capsule 3  . hyoscyamine (LEVBID) 0.375 MG 12 hr tablet Take by mouth.    . Lancets (ONETOUCH ULTRASOFT) lancets USE AS DIRECTED 3 TIMES A DAY 100 each 5  . magnesium oxide (MAG-OX) 400 MG tablet Take by mouth.    . meclizine (ANTIVERT) 25 MG tablet Take 1-2 tablets (25-50 mg total) by mouth 2 (two) times daily as needed for dizziness. if having symptoms.  May repeat in 12 hours later. 30 tablet 2  . metFORMIN (GLUCOPHAGE) 1000 MG tablet Take 1 tablet (1,000 mg total) by mouth 2 (two) times daily with a meal. 180 tablet 3  . Multiple Vitamin (MULTIVITAMIN) tablet Take 1 tablet by mouth daily.    . Omega-3 Fatty Acids (FISH OIL PO) Take 300 mg by mouth daily.    . pantoprazole (PROTONIX) 40  MG tablet Take 1 tablet (40 mg total) by mouth daily. 90 tablet 2  . PNEUMOVAX 23 25 MCG/0.5ML injection     . polyethylene glycol (MIRALAX / GLYCOLAX) packet Take 17 g by mouth every other day. 14 each 1  . traMADol (ULTRAM) 50 MG tablet TAKE 1 TABLET BY MOUTH UP TO THREE TIMES DAILY AS NEEDED 30 tablet 0  . valsartan (DIOVAN) 80 MG tablet Take 1 tablet (80 mg total) by mouth daily. 90 tablet 3  . vitamin B-12 (CYANOCOBALAMIN) 1000 MCG tablet Take 1,000 mcg by mouth daily.    . vitamin E 400 UNIT capsule Take 400 Units by mouth daily.    . [DISCONTINUED] Lancets (ONETOUCH ULTRASOFT) lancets Use as directed to test glucose 3/day.  Dx code: E11.9 100 each 5   No current facility-administered medications on file prior to visit.     Allergies  Allergen Reactions  . Alcohol Swabs  [Isopropyl Alcohol] Nausea Only    Use peroxide instead  . Penicillins Other (See Comments)    Arm swelling  . Prednisone Other (See Comments)    Could not sleep  . Amoxicillin-Pot Clavulanate Other (See Comments)    She is unsure what reaction she had    Objective: CAELA SHANNON is a pleasant 69 y.o. y.o. Patient Race: Black or African American [2]  female in NAD. AAO x 3.  There were no vitals filed for this visit.  Vascular Examination: Neurovascular status unchanged b/l. Capillary refill time to digits immediate b/l. Palpable DP pulses b/l. Palpable PT pulses b/l. Pedal hair absent b/l Skin temperature gradient within normal limits b/l.  Dermatological Examination: Pedal skin with normal turgor, texture and tone bilaterally. No open wounds bilaterally. No interdigital macerations bilaterally. Toenails 1-5 b/l elongated, dystrophic, thickened, crumbly with subungual debris and tenderness to dorsal palpation.  Musculoskeletal: Normal muscle strength 5/5 to all lower extremity muscle groups bilaterally. No pain crepitus or joint limitation noted with ROM b/l. Hallux valgus with bunion deformity noted b/l.  Neurological Examination: Pt has subjective symptoms of neuropathy managed with oral medication. Protective sensation intact 5/5 intact bilaterally with 10g monofilament b/l. Vibratory sensation intact b/l. Proprioception intact bilaterally.  Assessment: 1. Pain due to onychomycosis of toenails of both feet   2. Type 2 diabetes, controlled, with neuropathy ([place])   Plan: -Examined patient. -Continue diabetic foot care principles. Literature dispensed on today.  -Toenails 1-5 b/l were debrided in length and girth with sterile nail nippers and dremel without iatrogenic bleeding.  -Patient to continue soft, supportive shoe gear daily. -Patient to report any pedal injuries to medical professional immediately. -Patient/POA to call should there be question/concern in the  interim.  Return in about 3 months (around 08/31/2019) for diabetic nail trim.  Marzetta Board, DPM

## 2019-06-09 DIAGNOSIS — J301 Allergic rhinitis due to pollen: Secondary | ICD-10-CM | POA: Diagnosis not present

## 2019-06-09 DIAGNOSIS — J3089 Other allergic rhinitis: Secondary | ICD-10-CM | POA: Diagnosis not present

## 2019-06-16 DIAGNOSIS — J3089 Other allergic rhinitis: Secondary | ICD-10-CM | POA: Diagnosis not present

## 2019-06-16 DIAGNOSIS — J301 Allergic rhinitis due to pollen: Secondary | ICD-10-CM | POA: Diagnosis not present

## 2019-06-19 DIAGNOSIS — J3089 Other allergic rhinitis: Secondary | ICD-10-CM | POA: Diagnosis not present

## 2019-06-19 DIAGNOSIS — J301 Allergic rhinitis due to pollen: Secondary | ICD-10-CM | POA: Diagnosis not present

## 2019-06-23 DIAGNOSIS — J3089 Other allergic rhinitis: Secondary | ICD-10-CM | POA: Diagnosis not present

## 2019-06-23 DIAGNOSIS — J301 Allergic rhinitis due to pollen: Secondary | ICD-10-CM | POA: Diagnosis not present

## 2019-06-27 ENCOUNTER — Ambulatory Visit (INDEPENDENT_AMBULATORY_CARE_PROVIDER_SITE_OTHER): Payer: Medicare Other | Admitting: Family Medicine

## 2019-06-27 ENCOUNTER — Other Ambulatory Visit: Payer: Self-pay

## 2019-06-27 VITALS — BP 122/80 | HR 84 | Ht 65.0 in | Wt 158.2 lb

## 2019-06-27 DIAGNOSIS — I10 Essential (primary) hypertension: Secondary | ICD-10-CM

## 2019-06-27 DIAGNOSIS — K219 Gastro-esophageal reflux disease without esophagitis: Secondary | ICD-10-CM

## 2019-06-27 DIAGNOSIS — E0840 Diabetes mellitus due to underlying condition with diabetic neuropathy, unspecified: Secondary | ICD-10-CM | POA: Diagnosis not present

## 2019-06-27 DIAGNOSIS — F411 Generalized anxiety disorder: Secondary | ICD-10-CM | POA: Diagnosis not present

## 2019-06-27 MED ORDER — METFORMIN HCL 1000 MG PO TABS: 1000.0000 mg | ORAL_TABLET | Freq: Two times a day (BID) | ORAL | 3 refills | Status: DC

## 2019-06-27 MED ORDER — SUCRALFATE 1 GM/10ML PO SUSP
1.0000 g | Freq: Three times a day (TID) | ORAL | 0 refills | Status: DC
Start: 2019-06-27 — End: 2022-08-06

## 2019-06-27 MED ORDER — PANTOPRAZOLE SODIUM 40 MG PO TBEC: 40.0000 mg | DELAYED_RELEASE_TABLET | Freq: Two times a day (BID) | ORAL | 2 refills | Status: DC

## 2019-06-27 MED ORDER — SUCRALFATE 1 GM/10ML PO SUSP
1.0000 g | Freq: Three times a day (TID) | ORAL | 0 refills | Status: DC
Start: 2019-06-27 — End: 2019-06-27

## 2019-06-27 MED ORDER — ATORVASTATIN CALCIUM 40 MG PO TABS: 40.0000 mg | ORAL_TABLET | Freq: Every day | ORAL | 3 refills | Status: DC

## 2019-06-27 MED ORDER — ATENOLOL 25 MG PO TABS: ORAL_TABLET | ORAL | 1 refills | Status: DC

## 2019-06-27 MED ORDER — VALSARTAN 80 MG PO TABS: 80.0000 mg | ORAL_TABLET | Freq: Every day | ORAL | 3 refills | Status: DC

## 2019-06-27 MED ORDER — HYDROCHLOROTHIAZIDE 12.5 MG PO CAPS: 12.5000 mg | ORAL_CAPSULE | Freq: Every day | ORAL | 3 refills | Status: DC

## 2019-06-27 MED ORDER — CITALOPRAM HYDROBROMIDE 20 MG PO TABS: 20.0000 mg | ORAL_TABLET | Freq: Every day | ORAL | 3 refills | Status: DC

## 2019-06-27 MED ORDER — GLIPIZIDE 10 MG PO TABS: ORAL_TABLET | ORAL | 3 refills | Status: DC

## 2019-06-27 NOTE — Patient Instructions (Addendum)
It was great to see you today! Thank you for letting me participate in your care!  Today, we discussed your continued abdominal bloating and GERD. I have increased your Protonix to two times daily for two weeks and started you on carafate to help with symptoms given your history of ulcers. I have referred you to your Gastroenterologist Dr. Henrene Pastor.  Be well, Harolyn Rutherford, DO PGY-3, [facility]

## 2019-06-27 NOTE — Progress Notes (Signed)
    SUBJECTIVE:   CHIEF COMPLAINT / HPI:   GERD/increase gas Patient presents today for 2 weeks of increased discomfort and "feeling bloated". She has GERD and has been taking her PPI as directed but feels like her symptoms are getting worse. Over the past two weeks she has had increased dry cough and burning in the esophagus. She can eat but states afterward is when she has the discomfort. The bloating started earlier and has been ongoing intermittently for 4 months. She went to a doctor in [facility] and was given Hycosamine and states it worked a little bit at first but now it does not help. She has some constipation for which she takes Miralax which helps. She denies fever, chills, abdominal pain, urinary burning, urinary urgency or frequency, no blood in the urine or stool. No hematemesis. No chest pain or pressure.  PERTINENT  PMH / PSH: HTN, GERD, Chronic Low Back Pain, Hx of peptic ulcer  OBJECTIVE:   BP 122/80   Pulse 84   Ht 5\' 5"  (1.651 m)   Wt 158 lb 3.2 oz (71.8 kg)   SpO2 97%   BMI 26.33 kg/m   Gen: NAD Cardiac: RRR, no murmurs Resp: CTAB, no crackles  ASSESSMENT/PLAN:   GERD Suspect she has a flare up of her GERD accounting for the discomfort after eating and the dry cough. However, she does have a history of peptic ulcer so I am concerned that could also be contributing.  - For 2 weeks increase pantoprazole 40mg  to BID then go back to once daily - Refilled Carafate to take TID for the next week, then use PRN. Per patient request, referred her to her GI Dr. Henrene Pastor as she has seen him before.   Nuala Alpha, [place]

## 2019-06-29 ENCOUNTER — Other Ambulatory Visit: Payer: Self-pay | Admitting: Family Medicine

## 2019-06-29 DIAGNOSIS — J301 Allergic rhinitis due to pollen: Secondary | ICD-10-CM | POA: Diagnosis not present

## 2019-06-29 DIAGNOSIS — J3089 Other allergic rhinitis: Secondary | ICD-10-CM | POA: Diagnosis not present

## 2019-06-29 DIAGNOSIS — M5441 Lumbago with sciatica, right side: Secondary | ICD-10-CM

## 2019-06-29 DIAGNOSIS — G8929 Other chronic pain: Secondary | ICD-10-CM

## 2019-06-29 NOTE — Assessment & Plan Note (Signed)
Suspect she has a flare up of her GERD accounting for the discomfort after eating and the dry cough. However, she does have a history of peptic ulcer so I am concerned that could also be contributing.  - For 2 weeks increase pantoprazole 40mg  to BID then go back to once daily - Refilled Carafate to take TID for the next week, then use PRN.

## 2019-07-03 DIAGNOSIS — J4520 Mild intermittent asthma, uncomplicated: Secondary | ICD-10-CM | POA: Diagnosis not present

## 2019-07-03 DIAGNOSIS — J019 Acute sinusitis, unspecified: Secondary | ICD-10-CM | POA: Diagnosis not present

## 2019-07-03 DIAGNOSIS — K219 Gastro-esophageal reflux disease without esophagitis: Secondary | ICD-10-CM | POA: Diagnosis not present

## 2019-07-03 DIAGNOSIS — J3089 Other allergic rhinitis: Secondary | ICD-10-CM | POA: Diagnosis not present

## 2019-07-03 DIAGNOSIS — H1045 Other chronic allergic conjunctivitis: Secondary | ICD-10-CM | POA: Diagnosis not present

## 2019-07-07 ENCOUNTER — Other Ambulatory Visit: Payer: Self-pay

## 2019-07-07 ENCOUNTER — Ambulatory Visit: Payer: Medicare Other | Admitting: Orthotics

## 2019-07-07 DIAGNOSIS — M2012 Hallux valgus (acquired), left foot: Secondary | ICD-10-CM | POA: Diagnosis not present

## 2019-07-07 DIAGNOSIS — M2011 Hallux valgus (acquired), right foot: Secondary | ICD-10-CM | POA: Diagnosis not present

## 2019-07-07 DIAGNOSIS — E1140 Type 2 diabetes mellitus with diabetic neuropathy, unspecified: Secondary | ICD-10-CM | POA: Diagnosis not present

## 2019-07-11 ENCOUNTER — Other Ambulatory Visit: Payer: Self-pay | Admitting: Family Medicine

## 2019-07-11 DIAGNOSIS — E2839 Other primary ovarian failure: Secondary | ICD-10-CM

## 2019-07-14 ENCOUNTER — Other Ambulatory Visit: Payer: Medicare Other

## 2019-07-14 ENCOUNTER — Ambulatory Visit
Admission: RE | Admit: 2019-07-14 | Discharge: 2019-07-14 | Disposition: A | Discharge status: Home / Self Care | Payer: Medicare Other | Source: Ambulatory Visit | Attending: Family Medicine | Admitting: Family Medicine

## 2019-07-14 ENCOUNTER — Other Ambulatory Visit: Payer: Self-pay

## 2019-07-14 DIAGNOSIS — J3089 Other allergic rhinitis: Secondary | ICD-10-CM | POA: Diagnosis not present

## 2019-07-14 DIAGNOSIS — J301 Allergic rhinitis due to pollen: Secondary | ICD-10-CM | POA: Diagnosis not present

## 2019-07-14 DIAGNOSIS — E2839 Other primary ovarian failure: Secondary | ICD-10-CM

## 2019-07-14 DIAGNOSIS — Z780 Asymptomatic menopausal state: Secondary | ICD-10-CM | POA: Diagnosis not present

## 2019-07-15 ENCOUNTER — Encounter: Payer: Self-pay | Admitting: Family Medicine

## 2019-07-19 ENCOUNTER — Telehealth: Payer: Self-pay

## 2019-07-19 NOTE — Telephone Encounter (Signed)
Patient calls nurse line reporting low blood pressure. Patient reports today BP 103/66 after she had taken her morning bp meds. Patient reports she feels tired and a "little" dizzy, however this is nothing new and has been going on for sometime. Patient does have one more bp med to take at bedtime. Patient has an apt scheduled for 855am tomorrow in our clinic. ED precautions given to patient.

## 2019-07-20 ENCOUNTER — Ambulatory Visit (INDEPENDENT_AMBULATORY_CARE_PROVIDER_SITE_OTHER): Payer: Medicare Other | Admitting: Family Medicine

## 2019-07-20 ENCOUNTER — Other Ambulatory Visit: Payer: Self-pay

## 2019-07-20 VITALS — BP 82/50 | HR 72 | Ht 65.0 in | Wt 152.0 lb

## 2019-07-20 DIAGNOSIS — J3089 Other allergic rhinitis: Secondary | ICD-10-CM | POA: Diagnosis not present

## 2019-07-20 DIAGNOSIS — I10 Essential (primary) hypertension: Secondary | ICD-10-CM | POA: Diagnosis not present

## 2019-07-20 DIAGNOSIS — E0840 Diabetes mellitus due to underlying condition with diabetic neuropathy, unspecified: Secondary | ICD-10-CM

## 2019-07-20 DIAGNOSIS — I952 Hypotension due to drugs: Secondary | ICD-10-CM

## 2019-07-20 DIAGNOSIS — J301 Allergic rhinitis due to pollen: Secondary | ICD-10-CM | POA: Diagnosis not present

## 2019-07-20 MED ORDER — VALSARTAN 40 MG PO TABS: 40.0000 mg | ORAL_TABLET | Freq: Every day | ORAL | 1 refills | Status: DC

## 2019-07-20 NOTE — Progress Notes (Signed)
    SUBJECTIVE:   CHIEF COMPLAINT / HPI:   [name] is a 69 y.o. female here for low blood pressure.  Low blood pressure Patient reporting blood pressures 100/60 at home.  States that she occasionally gets lightheaded/dizzy and feels off balance.  Says she has no energy at times.  Has been experiencing leg cramps.  States that Dr. Lovena Le, her cardiologist, recently decreased her losartan.  She reports a 20 pound weight loss in the last 7 to 8 months.  Denies fever, chills.  States that she has seen someone at [facility] for her weight loss.  Has been losing weight since her husband passed away.  Says that she normally runs around 172 pounds.  Takes atenolol, HCTZ, valsartan for blood pressure.       PERTINENT  PMH / PSH: Hypertension, hx of atrial flutter, hx of SVT  OBJECTIVE:   BP (!) 82/50   Pulse 72   Ht 5\' 5"  (1.651 m)   Wt 152 lb (68.9 kg)   SpO2 98%   BMI 25.29 kg/m   GEN: pleasant older female, in no acute distress CV: regular rate and rhythm, no murmurs appreciated  RESP: no increased work of breathing, clear to ascultation bilaterally  ABD: Bowel sounds present. Soft, Nontender, Nondistended. MSK: no lower extremity edema, normal ROM  SKIN: warm, dry NEURO: grossly normal, moves all extremities appropriately PSYCH: Normal affect, appropriate speech and behavior    ASSESSMENT/PLAN:   Hypotension due to medication Decrease valsartan to 40 mg daily.  Continue taking atenolol as prescribed by your cardiologist.  In light of patient's recent weight loss well discontinue HCTZ.  Patient also help with her leg cramps. -Follow-up in 2 weeks for hypertension medication management   Weight loss  Per chart review, patient has been losing weight since her husband died.  She was seen at [facility] and pt was worked up for lactose intolerance in Feb 2021. In review, patient has lost 10 lbs in the past 6 months.  Will continue to monitor.  Colonoscopy due in 2024, previously  found 2 polyps in 2019. Mammogram in Dec 2020 was unremarkable.   Lyndee Hensen, [place]

## 2019-07-20 NOTE — Patient Instructions (Signed)
It was great seeing you today!   I'd like to see you back in 2 weeks for BP follow up but if you need to be seen earlier than that for any new issues we're happy to fit you in, just give [place] a call!  Visit reminders:  You low blood pressures are likely do to your recent weight loss.   STOP taking Hydrochlorothiazide.  We decreased your Valsartan to 40 mg daily  Monitor your blood pressures.       If you have questions or concerns please do not hesitate to call at [phone].  Dr. Rushie [facility]

## 2019-07-21 ENCOUNTER — Telehealth: Payer: Self-pay | Admitting: Internal Medicine

## 2019-07-21 DIAGNOSIS — I952 Hypotension due to drugs: Secondary | ICD-10-CM | POA: Insufficient documentation

## 2019-07-21 NOTE — Assessment & Plan Note (Addendum)
Decrease valsartan to 40 mg daily.  Continue taking atenolol as prescribed by your cardiologist.  In light of patient's recent weight loss well discontinue HCTZ.  Patient also help with her leg cramps. -Follow-up in 2 weeks for hypertension medication management

## 2019-07-21 NOTE — Telephone Encounter (Signed)
Hey Dr Henrene Pastor, this pt is being referred to [place] by [place], his records are in [place] but I will print out his last office visit from [place] for you to review, thanks!

## 2019-07-21 NOTE — Telephone Encounter (Signed)
Ok to return to our practice

## 2019-07-23 ENCOUNTER — Other Ambulatory Visit: Payer: Self-pay

## 2019-07-23 ENCOUNTER — Encounter (HOSPITAL_COMMUNITY): Payer: Self-pay | Admitting: Emergency Medicine

## 2019-07-23 ENCOUNTER — Emergency Department (HOSPITAL_COMMUNITY): Payer: Medicare Other

## 2019-07-23 ENCOUNTER — Emergency Department (HOSPITAL_COMMUNITY)
Admission: EM | Admit: 2019-07-23 | Discharge: 2019-07-23 | Disposition: A | Discharge status: Home / Self Care | Payer: Medicare Other | Attending: Emergency Medicine | Admitting: Emergency Medicine

## 2019-07-23 DIAGNOSIS — F17210 Nicotine dependence, cigarettes, uncomplicated: Secondary | ICD-10-CM | POA: Diagnosis not present

## 2019-07-23 DIAGNOSIS — J45909 Unspecified asthma, uncomplicated: Secondary | ICD-10-CM | POA: Insufficient documentation

## 2019-07-23 DIAGNOSIS — Z7984 Long term (current) use of oral hypoglycemic drugs: Secondary | ICD-10-CM | POA: Insufficient documentation

## 2019-07-23 DIAGNOSIS — E119 Type 2 diabetes mellitus without complications: Secondary | ICD-10-CM | POA: Insufficient documentation

## 2019-07-23 DIAGNOSIS — I959 Hypotension, unspecified: Secondary | ICD-10-CM | POA: Diagnosis not present

## 2019-07-23 DIAGNOSIS — I509 Heart failure, unspecified: Secondary | ICD-10-CM | POA: Insufficient documentation

## 2019-07-23 DIAGNOSIS — I110 Hypertensive heart disease with heart failure: Secondary | ICD-10-CM | POA: Insufficient documentation

## 2019-07-23 DIAGNOSIS — Z7982 Long term (current) use of aspirin: Secondary | ICD-10-CM | POA: Insufficient documentation

## 2019-07-23 DIAGNOSIS — Z79899 Other long term (current) drug therapy: Secondary | ICD-10-CM | POA: Diagnosis not present

## 2019-07-23 DIAGNOSIS — I952 Hypotension due to drugs: Secondary | ICD-10-CM

## 2019-07-23 DIAGNOSIS — R202 Paresthesia of skin: Secondary | ICD-10-CM | POA: Diagnosis present

## 2019-07-23 LAB — CBC
HCT: 38.2 % (ref 36.0–46.0)
Hemoglobin: 13.1 g/dL (ref 12.0–15.0)
MCH: 32 pg (ref 26.0–34.0)
MCHC: 34.3 g/dL (ref 30.0–36.0)
MCV: 93.2 fL (ref 80.0–100.0)
Platelets: 203 10*3/uL (ref 150–400)
RBC: 4.1 MIL/uL (ref 3.87–5.11)
RDW: 12.9 % (ref 11.5–15.5)
WBC: 7.9 10*3/uL (ref 4.0–10.5)
nRBC: 0 % (ref 0.0–0.2)

## 2019-07-23 LAB — BASIC METABOLIC PANEL
Anion gap: 11 (ref 5–15)
BUN: 10 mg/dL (ref 8–23)
CO2: 26 mmol/L (ref 22–32)
Calcium: 9.1 mg/dL (ref 8.9–10.3)
Chloride: 103 mmol/L (ref 98–111)
Creatinine, Ser: 0.91 mg/dL (ref 0.44–1.00)
GFR calc Af Amer: >60 mL/min (ref >60)
GFR calc non Af Amer: >60 mL/min (ref >60)
Glucose, Bld: 297 mg/dL — ABNORMAL HIGH (ref 70–99)
Potassium: 3.4 mmol/L — ABNORMAL LOW (ref 3.5–5.1)
Sodium: 140 mmol/L (ref 135–145)

## 2019-07-23 LAB — TROPONIN I (HIGH SENSITIVITY)
Troponin I (High Sensitivity): 3 ng/L (ref <18)
Troponin I (High Sensitivity): 3 ng/L (ref <18)

## 2019-07-23 MED ORDER — SODIUM CHLORIDE 0.9% FLUSH: 3.0000 mL | Freq: Once | INTRAVENOUS | Status: DC

## 2019-07-23 NOTE — ED Notes (Signed)
Pt allergic to alcohol, soap and water used to clean site for blood draw RN made aware.

## 2019-07-23 NOTE — Discharge Instructions (Addendum)
Take your medication list and your medications to the Pharmacy tomorrow to make sure your dosages and pills match.  Hold your valsartan until dosages are checked.

## 2019-07-23 NOTE — ED Provider Notes (Signed)
[facility] EMERGENCY DEPARTMENT Provider Note   CSN: [id number] Arrival date & time: 07/23/19  1428     History Chief Complaint  Patient presents with  . tingling    [name] is a 69 y.o. female.  Pt reports her blood pressure has been low.  Pt saw her MD who had her stop hctz.  Pt states she thinks her prescription for Diovan is 160 mg and she is suppose to be taking 40 mg.  Pt reports when her blood pressure is low she feels tingly.  Pt denies any chest pain no shortness of breath  The history is provided by the patient. No language interpreter was used.       Past Medical History:  Diagnosis Date  . Allergy   . Anxiety   . AR (allergic rhinitis)   . Asthma   . Cataract   . CHF (congestive heart failure) ([place])   . Colon polyps    hyperplastic  . Disturbance of skin sensation    facial paresthesia; left  . DM2 (diabetes mellitus, type 2) ([place])   . Dyslipidemia   . Fatty liver   . GERD (gastroesophageal reflux disease)   . Hemorrhoids   . Hyperlipidemia   . Hypertension    essential, benign   . Insomnia   . Menopausal syndrome   . Palpitations    hx  . Routine general medical examination at a health care facility   . Screening for malignant neoplasm of the cervix   . Skin lesion   . SVT (supraventricular tachycardia) ([place])   . Tobacco abuse   . Weakness     Patient Active Problem List   Diagnosis Date Noted  . Hypotension due to medication 07/21/2019  . SVT (supraventricular tachycardia) ([place]) 03/10/2019  . Nodule of anterior chest wall 11/12/2018  . Fatigue 09/20/2018  . Shoulder mass 08/22/2016  . Eczema 05/07/2015  . Hepatic steatosis 05/22/2014  . Chronic low back pain 05/21/2014  . S/P ablation of atrial flutter 01/09/2014  . Dysphagia 02/24/2013  . Extrinsic asthma 09/29/2012  . Onychomycosis 06/10/2012  . Tobacco use disorder 11/03/2007  . Primary hypertension 10/18/2007  . PTSD (post-traumatic stress disorder)  10/03/2007  . Controlled diabetes mellitus with neurologic complication, without long-term current use of insulin ([place]) 08/18/2007  . Advanced aortic atherosclerosis ([place]) 02/17/2007  . Allergic rhinitis 07/21/2006  . GERD 07/21/2006    Past Surgical History:  Procedure Laterality Date  . ABDOMINAL HYSTERECTOMY  1990   For fibroids benign.  Still has ovaries  . ablation for SVT  9/09   Dr. Lovena Le  . APPENDECTOMY    . CATARACT EXTRACTION, BILATERAL    . CESAREAN SECTION    . COLONOSCOPY  2009  . dental extractions    . HEMORRHOID BANDING  2014   w/Dr.Medoff  . SIGMOIDOSCOPY  2004     OB History   No obstetric history on file.     Family History  Problem Relation Age of Onset  . Heart attack Father 74  . Hypertension Mother   . Stroke Mother   . Diabetes Mother   . Stomach cancer Mother   . Cancer Mother   . Diabetes Brother   . Coronary artery disease Other        female, 1st degree relative <50  . Stomach cancer Maternal Aunt   . Diabetes Brother   . Colon cancer Neg Hx   . Esophageal cancer Neg Hx  Social History   Tobacco Use  . Smoking status: Current Every Day Smoker    Packs/day: 1.00    Types: Cigarettes  . Smokeless tobacco: Never Used  Vaping Use  . Vaping Use: Never used  Substance Use Topics  . Alcohol use: Not Currently  . Drug use: No    Home Medications Prior to Admission medications   Medication Sig Start Date End Date Taking? Authorizing Provider  acetaminophen (TYLENOL) 325 MG tablet Take 2 tablets (650 mg total) by mouth every 6 (six) hours as needed for moderate pain. 03/02/17   Rogue Bussing, MD  aspirin 81 MG chewable tablet Chew 1 tablet (81 mg total) by mouth daily. 03/02/17   Rogue Bussing, MD  atenolol (TENORMIN) 25 MG tablet TAKE ONE TABLET BY MOUTH DAILY 06/27/19   Nuala Alpha, DO  atorvastatin (LIPITOR) 40 MG tablet Take 1 tablet (40 mg total) by mouth daily. 06/27/19   Nuala Alpha, DO  baclofen  (LIORESAL) 10 MG tablet Take 1 tablet (10 mg total) by mouth daily as needed for muscle spasms. 08/31/18   Nuala Alpha, DO  citalopram (CELEXA) 20 MG tablet Take 1 tablet (20 mg total) by mouth daily. 06/27/19   Nuala Alpha, DO  EPINEPHrine 0.3 mg/0.3 mL IJ SOAJ injection USE AS DIRECTED AS NEEDED FOR SYSTEMIC REACTION 02/11/18   [provider]  gabapentin (NEURONTIN) 300 MG capsule TAKE 3 CAPSULES BY MOUTH TWICE A DAY AS NEEDED 08/31/18   Lockamy, Timothy, DO  glipiZIDE (GLUCOTROL) 10 MG tablet TAKE ONE TABLET BY MOUTH TWICE A DAY BEFORE A MEAL 06/27/19   Lockamy, Timothy, DO  glucose blood test strip Use as instructed 09/30/18   Lockamy, Timothy, DO  hyoscyamine (LEVBID) 0.375 MG 12 hr tablet Take by mouth. 02/28/19 03/30/19  [provider]  Lancets [facility] ULTRASOFT) lancets USE AS DIRECTED 3 TIMES A DAY 05/02/19   Lockamy, Timothy, DO  magnesium oxide (MAG-OX) 400 MG tablet Take by mouth.    [provider]  meclizine (ANTIVERT) 25 MG tablet Take 1-2 tablets (25-50 mg total) by mouth 2 (two) times daily as needed for dizziness. if having symptoms.  May repeat in 12 hours later. 03/02/17   Rogue Bussing, MD  metFORMIN (GLUCOPHAGE) 1000 MG tablet Take 1 tablet (1,000 mg total) by mouth 2 (two) times daily with a meal. 06/27/19   Nuala Alpha, DO  Multiple Vitamin (MULTIVITAMIN) tablet Take 1 tablet by mouth daily.    [provider]  Omega-3 Fatty Acids (FISH OIL PO) Take 300 mg by mouth daily.    [provider]  pantoprazole (PROTONIX) 40 MG tablet Take 1 tablet (40 mg total) by mouth 2 (two) times daily. For two weeks, then go back to once daily. 06/27/19   Nuala Alpha, DO  polyethylene glycol (MIRALAX / GLYCOLAX) packet Take 17 g by mouth every other day. 03/02/17   Rogue Bussing, MD  sucralfate (CARAFATE) 1 GM/10ML suspension Take 10 mLs (1 g total) by mouth 4 (four) times daily -  with meals and at bedtime. 06/27/19    Lockamy, Christia [place], DO  traMADol (ULTRAM) 50 MG tablet TAKE 1 TABLET BY MOUTH UP TO  THREE TIMES DAILY AS NEEDED 06/29/19   Lockamy, Timothy, DO  valsartan (DIOVAN) 40 MG tablet Take 1 tablet (40 mg total) by mouth daily. 07/20/19 01/16/20  Lyndee Hensen, DO  vitamin B-12 (CYANOCOBALAMIN) 1000 MCG tablet Take 1,000 mcg by mouth daily.    [provider]  vitamin E 400 UNIT capsule Take 400 Units by mouth daily.    [provider]  Lancets [facility] ULTRASOFT) lancets Use as directed to test glucose 3/day.  Dx code: E11.9 08/31/18   Nuala Alpha, DO    Allergies    Alcohol swabs [isopropyl alcohol], Penicillins, Prednisone, and Amoxicillin-pot clavulanate  Review of Systems   Review of Systems  All other systems reviewed and are negative.   Physical Exam Updated Vital Signs BP 119/73 (BP Location: Left Arm)   Pulse 83   Temp 98.3 F (36.8 C)   Resp 19   SpO2 100%   Physical Exam Vitals and nursing note reviewed.  Constitutional:      Appearance: She is well-developed.  HENT:     Head: Normocephalic.     Right Ear: Tympanic membrane normal.     Left Ear: Tympanic membrane normal.     Mouth/Throat:     Mouth: Mucous membranes are moist.  Eyes:     Extraocular Movements: Extraocular movements intact.     Pupils: Pupils are equal, round, and reactive to light.  Cardiovascular:     Rate and Rhythm: Normal rate and regular rhythm.  Pulmonary:     Effort: Pulmonary effort is normal.  Abdominal:     General: There is no distension.  Musculoskeletal:        General: Normal range of motion.     Cervical back: Normal range of motion.  Skin:    General: Skin is warm.  Neurological:     Mental Status: She is alert and oriented to person, place, and time.  Psychiatric:        Mood and Affect: Mood normal.     ED Results / Procedures / Treatments   Labs (all labs ordered are listed, but only abnormal results are displayed) Labs Reviewed  BASIC METABOLIC  PANEL - Abnormal; Notable for the following components:      Result Value   Potassium 3.4 (*)    Glucose, Bld 297 (*)    All other components within normal limits  CBC  TROPONIN I (HIGH SENSITIVITY)  TROPONIN I (HIGH SENSITIVITY)    EKG None  Radiology DG Chest 2 View  Result Date: 07/23/2019 CLINICAL DATA:  Tingling all over.  Low blood pressure. EXAM: CHEST - 2 VIEW COMPARISON:  July 05, 2017 FINDINGS: The heart size and mediastinal contours are within normal limits. Both lungs are clear. The visualized skeletal structures are unremarkable. IMPRESSION: No active cardiopulmonary disease. Electronically Signed   By: Dorise Bullion III M.D   On: 07/23/2019 15:13    Procedures Procedures (including critical care time)  Medications Ordered in ED Medications  sodium chloride flush (NS) 0.9 % injection 3 mL (has no administration in time range)    ED Course  I have reviewed the triage vital signs and the nursing notes.  Pertinent labs & imaging results that were available during my care of the patient were reviewed by me and considered in my medical decision making (see chart for details).    MDM Rules/Calculators/A&P                          MDM:  EKg no acute abnormality, chest xray no acute,  Troponin negative x 2.  Pt's pharmacy is closed.  I asked pt to take her med list and her medications to pharmacy to review.  Pt advised to hold diovan until pharmacist reviews. Final Clinical Impression(s) /  ED Diagnoses Final diagnoses:  Hypotension due to drugs    Rx / DC Orders ED Discharge Orders    None    An After Visit Summary was printed and given to the patient.    Fransico Meadow, Hershal Coria 07/23/19 2327    Pattricia Boss, MD 07/24/19 [phone]

## 2019-07-23 NOTE — ED Triage Notes (Signed)
Pt reports tingling all over x 2 hours.  States her BP has been low over the last 2 weeks.  Also reports mild dizziness.  Denies SOB.  No neuro deficits noted.

## 2019-07-23 NOTE — ED Notes (Signed)
Pt states she is going to step outside for a few minutes.

## 2019-07-25 ENCOUNTER — Other Ambulatory Visit: Payer: Self-pay | Admitting: *Deleted

## 2019-07-25 DIAGNOSIS — I10 Essential (primary) hypertension: Secondary | ICD-10-CM

## 2019-07-25 DIAGNOSIS — E0840 Diabetes mellitus due to underlying condition with diabetic neuropathy, unspecified: Secondary | ICD-10-CM

## 2019-07-27 ENCOUNTER — Telehealth: Payer: Self-pay | Admitting: Internal Medicine

## 2019-07-27 DIAGNOSIS — J3089 Other allergic rhinitis: Secondary | ICD-10-CM | POA: Diagnosis not present

## 2019-07-27 DIAGNOSIS — J301 Allergic rhinitis due to pollen: Secondary | ICD-10-CM | POA: Diagnosis not present

## 2019-07-27 NOTE — Telephone Encounter (Signed)
Pt c/o medication issue:  1. Name of Medication: Valsartan  2. How are you currently taking this medication (dosage and times per day)? N/A  3. Are you having a reaction (difficulty breathing--STAT)? No  4. What is your medication issue? Patient is requesting clarification on dosage and frequency. Please call to discuss instructions on how she is to take the medication.

## 2019-07-28 ENCOUNTER — Other Ambulatory Visit: Payer: Self-pay

## 2019-07-28 ENCOUNTER — Telehealth: Payer: Self-pay | Admitting: Family Medicine

## 2019-07-28 DIAGNOSIS — I10 Essential (primary) hypertension: Secondary | ICD-10-CM

## 2019-07-28 DIAGNOSIS — E0840 Diabetes mellitus due to underlying condition with diabetic neuropathy, unspecified: Secondary | ICD-10-CM

## 2019-07-28 NOTE — Telephone Encounter (Signed)
Returned call to pt.  Advised Pt to follow up with PCP-since she just saw them 07/20/19 and they are making medication adjustments.    Pt sees PCP again on Monday 07/31/19.  Advised Pt to take ALL her medications with her to appointment to ensure medication list is accurate as medication adjustments are made.  Pt indicates understanding and in agreement with plan.

## 2019-07-31 ENCOUNTER — Other Ambulatory Visit: Payer: Self-pay

## 2019-07-31 ENCOUNTER — Ambulatory Visit (INDEPENDENT_AMBULATORY_CARE_PROVIDER_SITE_OTHER): Payer: Medicare Other | Admitting: Family Medicine

## 2019-07-31 VITALS — BP 118/60 | HR 72 | Ht 65.5 in | Wt 155.4 lb

## 2019-07-31 DIAGNOSIS — I10 Essential (primary) hypertension: Secondary | ICD-10-CM

## 2019-07-31 DIAGNOSIS — E0840 Diabetes mellitus due to underlying condition with diabetic neuropathy, unspecified: Secondary | ICD-10-CM | POA: Diagnosis not present

## 2019-07-31 LAB — POCT GLYCOSYLATED HEMOGLOBIN (HGB A1C): HbA1c, POC (controlled diabetic range): 8.6 % — AB (ref 0.0–7.0)

## 2019-07-31 NOTE — Patient Instructions (Signed)
It was very nice to meet you today. Please enjoy the rest of your week. Today you were seen for your diabetes and blood pressure. We changed your blood pressure medication please stop taking valsartan altogether.  Please continue other medications for blood pressure as well as diabetes. Follow up in 1 week for blood pressure recheck or sooner if needed.   Please call the clinic at [phone] if your symptoms worsen or you have any concerns. It was our pleasure to serve you.

## 2019-07-31 NOTE — Progress Notes (Signed)
    SUBJECTIVE:   CHIEF COMPLAINT / HPI:   Diabetes Current Regimen: Glipizide 10 mg BID, Metformin 1000 mg BID CBGs: Low 79, Avg. 80-155, High 220s  Last A1c: 7.9 on 05/10/2019  Denies polyuria, polydipsia, hypoglycemia  Last Eye Exam: 11/10/2018 Statin: Atorvastatin 40 mg daily ACE/ARB: Discontinued valsartan at this visit   Hypertension Recently seen in the ED 07/23/19 for hypotension. Wrong prescription of valsartan was sent to patient from pharmacy. She was given 160 mg instead of 40mg . HCTZ was discontinued during a prior office visit. She was experiencing tingling of her whole body that has sinced resolved without taking any valsartan.  - Medications: Valsartan 40 mg /160 mg daily discontinued. Atenolol 25 mg daily - Compliance: Only with atenolol - Checking BP at home: Yes SBP 100-110 DBP 60-70 - Denies any SOB, CP, vision changes, LE edema, medication SEs, or symptoms of hypotension currently  PERTINENT  PMH / PSH: HLD, CHF, SVT (no echo in epic; follows with cardiology valsartan decreased to 80mg  from 160mg  in 02/2019), tobacco use disorder (PPD not ready to quit)  OBJECTIVE:   BP 118/60   Pulse 72   Ht 5' 5.5" (1.664 m)   Wt 155 lb 6.4 oz (70.5 kg)   SpO2 98%   BMI 25.47 kg/m   General: Appears well, no acute distress. Age appropriate. Cardiac: RRR, normal heart sounds, no murmurs Respiratory: CTAB, normal effort Extremities: No edema. WWP.   ASSESSMENT/PLAN:   Controlled diabetes mellitus with neurologic complication, without long-term current use of insulin (HCC) POCT Hgb A1c today 8.6. Continue current regimen.  -Return in 3 months  Primary hypertension BP at goal. Discontinued valsartan for now. Continue taking BPs at home and follow up in 1 week with BP log and medication management. Would ideally benenfit from ACE/ARB but even lower dose as she is well controlled at this time.    Gerlene Fee, [place]

## 2019-08-01 ENCOUNTER — Other Ambulatory Visit: Payer: Self-pay

## 2019-08-01 ENCOUNTER — Other Ambulatory Visit: Payer: Self-pay | Admitting: Family Medicine

## 2019-08-01 DIAGNOSIS — G8929 Other chronic pain: Secondary | ICD-10-CM

## 2019-08-01 DIAGNOSIS — J301 Allergic rhinitis due to pollen: Secondary | ICD-10-CM | POA: Diagnosis not present

## 2019-08-01 DIAGNOSIS — M5440 Lumbago with sciatica, unspecified side: Secondary | ICD-10-CM

## 2019-08-01 DIAGNOSIS — J3089 Other allergic rhinitis: Secondary | ICD-10-CM | POA: Diagnosis not present

## 2019-08-03 NOTE — Telephone Encounter (Signed)
Patient calling to check status of rx refill.   Nastashia Gallo C Chantea Surace, RN  

## 2019-08-04 DIAGNOSIS — J301 Allergic rhinitis due to pollen: Secondary | ICD-10-CM | POA: Diagnosis not present

## 2019-08-04 DIAGNOSIS — J3089 Other allergic rhinitis: Secondary | ICD-10-CM | POA: Diagnosis not present

## 2019-08-04 MED ORDER — TRAMADOL HCL 50 MG PO TABS: 50.0000 mg | ORAL_TABLET | Freq: Three times a day (TID) | ORAL | 0 refills | Status: DC | PRN

## 2019-08-04 NOTE — Assessment & Plan Note (Signed)
POCT Hgb A1c today 8.6. Continue current regimen.  -Return in 3 months

## 2019-08-04 NOTE — Assessment & Plan Note (Signed)
BP at goal. Discontinued valsartan for now. Continue taking BPs at home and follow up in 1 week with BP log and medication management. Would ideally benenfit from ACE/ARB but even lower dose as she is well controlled at this time.

## 2019-08-08 DIAGNOSIS — J301 Allergic rhinitis due to pollen: Secondary | ICD-10-CM | POA: Diagnosis not present

## 2019-08-08 DIAGNOSIS — J3089 Other allergic rhinitis: Secondary | ICD-10-CM | POA: Diagnosis not present

## 2019-08-10 ENCOUNTER — Encounter: Payer: Self-pay | Admitting: Internal Medicine

## 2019-08-15 DIAGNOSIS — J301 Allergic rhinitis due to pollen: Secondary | ICD-10-CM | POA: Diagnosis not present

## 2019-08-15 DIAGNOSIS — J3089 Other allergic rhinitis: Secondary | ICD-10-CM | POA: Diagnosis not present

## 2019-08-17 ENCOUNTER — Ambulatory Visit (INDEPENDENT_AMBULATORY_CARE_PROVIDER_SITE_OTHER): Payer: Medicare Other | Admitting: Family Medicine

## 2019-08-17 ENCOUNTER — Other Ambulatory Visit: Payer: Self-pay

## 2019-08-17 ENCOUNTER — Encounter: Payer: Self-pay | Admitting: Family Medicine

## 2019-08-17 VITALS — BP 135/76 | HR 74 | Ht 65.5 in | Wt 155.0 lb

## 2019-08-17 DIAGNOSIS — E0840 Diabetes mellitus due to underlying condition with diabetic neuropathy, unspecified: Secondary | ICD-10-CM | POA: Diagnosis not present

## 2019-08-17 DIAGNOSIS — I10 Essential (primary) hypertension: Secondary | ICD-10-CM | POA: Diagnosis not present

## 2019-08-17 DIAGNOSIS — Z20822 Contact with and (suspected) exposure to covid-19: Secondary | ICD-10-CM | POA: Diagnosis not present

## 2019-08-17 NOTE — Progress Notes (Signed)
    SUBJECTIVE:   CHIEF COMPLAINT / HPI:   [name] is a 69 year old female that presents for the issues below  Hypertension f/u - Medications: Atenolol 25 mg daily - Compliance: Endorses compliance - Checking BP at home: Regularly admits to being initially up elevated this morning due to her just receiving a phone call in the office from family members notifying her of a death due to Covid - Denies any SOB, CP, vision changes, LE edema, medication SEs, or symptoms of hypotension  Covid exposure Recently exposed to known Covid positive that was asymptomatic.  This was her niece who also works at a nursing home called [employer]  She denies any symptoms.  She has been vaccinated x2 last receiving the vaccine in March.  PERTINENT  PMH / PSH: SVT, history of hypertension, T2DM (last A1c 08/04/2019 was 8.6)  OBJECTIVE:   BP 135/76   Pulse 74   Ht 5' 5.5" (1.664 m)   Wt 155 lb (70.3 kg)   SpO2 97%   BMI 25.40 kg/m   General: Appears well, no acute distress. Age appropriate. Cardiac: RRR, normal heart sounds, no murmurs Respiratory: CTAB, normal effort Extremities: No edema or cyanosis.  ASSESSMENT/PLAN:   Primary hypertension BP initially elevated 158/80.  Recheck was 135/76.  BP continues to be at goal patient has cardiology appointment at the end of the month.  We will continue with atenolol 25 mg and follow-up after cardiology appointment.  For now encourage patient to continue to check blood pressure at home and call if she has any signs or symptoms of elevated BP and BP is >140/90. Return in 3 months for diabetes check up and smoking cessation counseling.  Close exposure to COVID-19 virus Asymptomatic.  Vaccinated x2.  Suggested testing sites given in AVS.  -N95 and eye protection worn by this provider during entire encounter  Gerlene Fee, [place]

## 2019-08-17 NOTE — Patient Instructions (Addendum)
It was nice to see you today.  Your blood pressure still at goal.  But please check your blood pressure if you feel or have headache, vision changes, chest pain, or shortness of breath.  If it is greater than 140/90 please call and let us know.  Please follow-up in 3 months for diabetes checkup.  Keep your appointment with your cardiologist as they may make some changes to your blood pressure medication prior to our follow-up.  Please call the clinic at [phone] if you have any concerns. It was our pleasure to serve you.  COVID Testing Locations [place] [employer] [place], [place] [address] Hours: [place], [place] [address] Hours: Mon 9a - 12p  [place], [place] [address] Hours: [place] [facility] [place], [place] [address] Hours: Thurs 2p - 5p

## 2019-08-21 DIAGNOSIS — Z20822 Contact with and (suspected) exposure to covid-19: Secondary | ICD-10-CM | POA: Insufficient documentation

## 2019-08-21 NOTE — Assessment & Plan Note (Signed)
Asymptomatic.  Vaccinated x2.  Suggested testing sites given in AVS.  -N95 and eye protection worn by this provider during entire encounter

## 2019-08-21 NOTE — Assessment & Plan Note (Addendum)
BP initially elevated 158/80.  Recheck was 135/76.  BP continues to be at goal patient has cardiology appointment at the end of the month.  We will continue with atenolol 25 mg and follow-up after cardiology appointment.  For now encourage patient to continue to check blood pressure at home and call if she has any signs or symptoms of elevated BP and BP is >140/90. Return in 3 months for diabetes check up and smoking cessation counseling.

## 2019-08-22 DIAGNOSIS — J3089 Other allergic rhinitis: Secondary | ICD-10-CM | POA: Diagnosis not present

## 2019-08-22 DIAGNOSIS — J301 Allergic rhinitis due to pollen: Secondary | ICD-10-CM | POA: Diagnosis not present

## 2019-08-29 ENCOUNTER — Other Ambulatory Visit: Payer: Self-pay | Admitting: Family Medicine

## 2019-08-29 DIAGNOSIS — G8929 Other chronic pain: Secondary | ICD-10-CM

## 2019-08-30 ENCOUNTER — Other Ambulatory Visit: Payer: Self-pay

## 2019-08-30 ENCOUNTER — Encounter: Payer: Self-pay | Admitting: Podiatry

## 2019-08-30 ENCOUNTER — Ambulatory Visit: Payer: Medicare Other | Admitting: Podiatry

## 2019-08-30 DIAGNOSIS — M79675 Pain in left toe(s): Secondary | ICD-10-CM

## 2019-08-30 DIAGNOSIS — B351 Tinea unguium: Secondary | ICD-10-CM | POA: Diagnosis not present

## 2019-08-30 DIAGNOSIS — E1140 Type 2 diabetes mellitus with diabetic neuropathy, unspecified: Secondary | ICD-10-CM

## 2019-08-30 DIAGNOSIS — M2010 Hallux valgus (acquired), unspecified foot: Secondary | ICD-10-CM

## 2019-08-30 DIAGNOSIS — M79674 Pain in right toe(s): Secondary | ICD-10-CM

## 2019-09-01 DIAGNOSIS — J3089 Other allergic rhinitis: Secondary | ICD-10-CM | POA: Diagnosis not present

## 2019-09-01 DIAGNOSIS — J301 Allergic rhinitis due to pollen: Secondary | ICD-10-CM | POA: Diagnosis not present

## 2019-09-02 NOTE — Progress Notes (Signed)
Subjective: [name] presents today preventative diabetic foot care and painful mycotic nails b/l that are difficult to trim. Pain interferes with ambulation. Aggravating factors include wearing enclosed shoe gear. Pain is relieved with periodic professional debridement.  She states part of her right great toe nailplate came off. Denies any redness, drainage or swelling.   [name], Tammy Plummer, DO is patient's PCP. Last visit was: 05/10/2019.  Past Medical History:  Diagnosis Date  . Allergy   . Anxiety   . AR (allergic rhinitis)   . Asthma   . Cataract   . CHF (congestive heart failure) ([place])   . Colon polyps    hyperplastic  . Disturbance of skin sensation    facial paresthesia; left  . DM2 (diabetes mellitus, type 2) ([place])   . Dyslipidemia   . Fatty liver   . GERD (gastroesophageal reflux disease)   . Hemorrhoids   . Hyperlipidemia   . Hypertension    essential, benign   . Insomnia   . Menopausal syndrome   . Palpitations    hx  . Routine general medical examination at a health care facility   . Screening for malignant neoplasm of the cervix   . Skin lesion   . SVT (supraventricular tachycardia) ([place])   . Tobacco abuse   . Weakness      Current Outpatient Medications on File Prior to Visit  Medication Sig Dispense Refill  . traMADol (ULTRAM) 50 MG tablet TAKE 1 TABLET BY MOUTH THREE TIMES DAILY AS NEEDED FOR  LOW BACK  PAIN 30 tablet 0  . acetaminophen (TYLENOL) 500 MG tablet Take 500 mg by mouth every 6 (six) hours as needed.    Marland Kitchen aspirin 81 MG chewable tablet Chew 1 tablet (81 mg total) by mouth daily. 100 tablet 6  . atenolol (TENORMIN) 25 MG tablet TAKE ONE TABLET BY MOUTH DAILY 90 tablet 1  . atorvastatin (LIPITOR) 40 MG tablet Take 1 tablet (40 mg total) by mouth daily. 90 tablet 3  . baclofen (LIORESAL) 10 MG tablet Take 1 tablet (10 mg total) by mouth daily as needed for muscle spasms. 30 tablet 1  . citalopram (CELEXA) 20 MG tablet Take 1 tablet (20 mg  total) by mouth daily. 90 tablet 3  . EPINEPHrine 0.3 mg/0.3 mL IJ SOAJ injection USE AS DIRECTED AS NEEDED FOR SYSTEMIC REACTION    . gabapentin (NEURONTIN) 300 MG capsule TAKE 3 CAPSULES BY MOUTH TWICE A DAY AS NEEDED 540 capsule 2  . glipiZIDE (GLUCOTROL) 10 MG tablet TAKE ONE TABLET BY MOUTH TWICE A DAY BEFORE A MEAL 180 tablet 3  . glucose blood test strip Use as instructed 100 each 12  . hyoscyamine (LEVBID) 0.375 MG 12 hr tablet Take by mouth.    . Lancets (ONETOUCH ULTRASOFT) lancets USE AS DIRECTED 3 TIMES A DAY 100 each 5  . magnesium oxide (MAG-OX) 400 MG tablet Take by mouth.    . meclizine (ANTIVERT) 25 MG tablet Take 1-2 tablets (25-50 mg total) by mouth 2 (two) times daily as needed for dizziness. if having symptoms.  May repeat in 12 hours later. 30 tablet 2  . metFORMIN (GLUCOPHAGE) 1000 MG tablet Take 1 tablet (1,000 mg total) by mouth 2 (two) times daily with a meal. 180 tablet 3  . Multiple Vitamin (MULTIVITAMIN) tablet Take 1 tablet by mouth daily.    . Omega-3 Fatty Acids (FISH OIL PO) Take 300 mg by mouth daily. (Patient not taking: Reported on 07/31/2019)    .  pantoprazole (PROTONIX) 40 MG tablet Take 1 tablet (40 mg total) by mouth 2 (two) times daily. For two weeks, then go back to once daily. 90 tablet 2  . polyethylene glycol (MIRALAX / GLYCOLAX) packet Take 17 g by mouth every other day. 14 each 1  . sucralfate (CARAFATE) 1 GM/10ML suspension Take 10 mLs (1 g total) by mouth 4 (four) times daily -  with meals and at bedtime. 420 mL 0  . valsartan (DIOVAN) 160 MG tablet Take 160 mg by mouth daily.    . vitamin B-12 (CYANOCOBALAMIN) 1000 MCG tablet Take 1,000 mcg by mouth daily.    . vitamin E 400 UNIT capsule Take 400 Units by mouth daily.    . [DISCONTINUED] Lancets (ONETOUCH ULTRASOFT) lancets Use as directed to test glucose 3/day.  Dx code: E11.9 100 each 5   No current facility-administered medications on file prior to visit.     Allergies  Allergen Reactions   . Alcohol Swabs [Isopropyl Alcohol] Nausea Only    Use peroxide instead  . Penicillins Other (See Comments)    Arm swelling  . Prednisone Other (See Comments)    Could not sleep  . Amoxicillin-Pot Clavulanate Other (See Comments)    She is unsure what reaction she had    Objective: [name] is a pleasant 69 y.o. AAF in NAD. AAO x 3.   There were no vitals filed for this visit.  Vascular Examination: Neurovascular status unchanged b/l. Capillary refill time to digits immediate b/l. Palpable DP pulses b/l. Palpable PT pulses b/l. Pedal hair absent b/l Skin temperature gradient within normal limits b/l.  Dermatological Examination: Pedal skin with normal turgor, texture and tone bilaterally. No open wounds bilaterally. No interdigital macerations bilaterally. Toenails 1-5 b/l elongated, dystrophic, thickened, crumbly with subungual debris and tenderness to dorsal palpation.  Musculoskeletal: Normal muscle strength 5/5 to all lower extremity muscle groups bilaterally. No pain crepitus or joint limitation noted with ROM b/l. Hallux valgus with bunion deformity noted b/l.  Neurological Examination: Pt has subjective symptoms of neuropathy managed with oral medication. Protective sensation intact 5/5 intact bilaterally with 10g monofilament b/l. Vibratory sensation intact b/l. Proprioception intact bilaterally.  Assessment: 1. Pain due to onychomycosis of toenails of both feet   2. Acquired hallux valgus, unspecified laterality   3. Type 2 diabetes, controlled, with neuropathy ([place])      Plan: -Examined patient. -No new findings. No new orders. -Continue diabetic foot care principles. -Toenails 1-5 b/l were debrided in length and girth with sterile nail nippers and dremel without iatrogenic bleeding.  -Patient to report any pedal injuries to medical professional immediately. -Patient to continue soft, supportive shoe gear daily. -Patient/POA to call should there be  question/concern in the interim.  Return in about 3 months (around 11/30/2019) for diabetic nail trim.  [name], DPM

## 2019-09-08 DIAGNOSIS — J301 Allergic rhinitis due to pollen: Secondary | ICD-10-CM | POA: Diagnosis not present

## 2019-09-08 DIAGNOSIS — J3089 Other allergic rhinitis: Secondary | ICD-10-CM | POA: Diagnosis not present

## 2019-09-11 DIAGNOSIS — J3089 Other allergic rhinitis: Secondary | ICD-10-CM | POA: Diagnosis not present

## 2019-09-11 DIAGNOSIS — H1045 Other chronic allergic conjunctivitis: Secondary | ICD-10-CM | POA: Diagnosis not present

## 2019-09-11 DIAGNOSIS — K219 Gastro-esophageal reflux disease without esophagitis: Secondary | ICD-10-CM | POA: Diagnosis not present

## 2019-09-11 DIAGNOSIS — J4520 Mild intermittent asthma, uncomplicated: Secondary | ICD-10-CM | POA: Diagnosis not present

## 2019-09-12 ENCOUNTER — Other Ambulatory Visit: Payer: Self-pay

## 2019-09-12 ENCOUNTER — Ambulatory Visit: Payer: Medicare Other | Admitting: Internal Medicine

## 2019-09-12 ENCOUNTER — Encounter: Payer: Self-pay | Admitting: Internal Medicine

## 2019-09-12 VITALS — BP 134/72 | HR 71 | Ht 64.5 in | Wt 156.0 lb

## 2019-09-12 DIAGNOSIS — I471 Supraventricular tachycardia: Secondary | ICD-10-CM

## 2019-09-12 DIAGNOSIS — I10 Essential (primary) hypertension: Secondary | ICD-10-CM

## 2019-09-12 MED ORDER — VALSARTAN 40 MG PO TABS
40.0000 mg | ORAL_TABLET | Freq: Every day | ORAL | 3 refills | Status: DC
Start: 2019-09-12 — End: 2020-02-16

## 2019-09-12 NOTE — Patient Instructions (Signed)

## 2019-09-12 NOTE — Progress Notes (Signed)
HPI [name] returns today for followup of SVT and HTN. She is a pleasant 69 yo woman with a h/o above, and ongoing tobacco abuse. She had lost weight after her husband died but this has improved. She has not had syncope or chest pain. No palpitations.  Allergies  Allergen Reactions  . Alcohol Swabs [Isopropyl Alcohol] Nausea Only    Use peroxide instead  . Penicillins Other (See Comments)    Arm swelling  . Prednisone Other (See Comments)    Could not sleep  . Amoxicillin-Pot Clavulanate Other (See Comments)    She is unsure what reaction she had     Current Outpatient Medications  Medication Sig Dispense Refill  . acetaminophen (TYLENOL) 500 MG tablet Take 500 mg by mouth every 6 (six) hours as needed.    Marland Kitchen aspirin 81 MG chewable tablet Chew 1 tablet (81 mg total) by mouth daily. 100 tablet 6  . atenolol (TENORMIN) 25 MG tablet TAKE ONE TABLET BY MOUTH DAILY 90 tablet 1  . atorvastatin (LIPITOR) 40 MG tablet Take 1 tablet (40 mg total) by mouth daily. 90 tablet 3  . baclofen (LIORESAL) 10 MG tablet Take 1 tablet (10 mg total) by mouth daily as needed for muscle spasms. 30 tablet 1  . citalopram (CELEXA) 20 MG tablet Take 1 tablet (20 mg total) by mouth daily. 90 tablet 3  . EPINEPHrine 0.3 mg/0.3 mL IJ SOAJ injection USE AS DIRECTED AS NEEDED FOR SYSTEMIC REACTION    . gabapentin (NEURONTIN) 300 MG capsule TAKE 3 CAPSULES BY MOUTH TWICE A DAY AS NEEDED 540 capsule 2  . glipiZIDE (GLUCOTROL) 10 MG tablet TAKE ONE TABLET BY MOUTH TWICE A DAY BEFORE A MEAL 180 tablet 3  . glucose blood test strip Use as instructed 100 each 12  . Lancets (ONETOUCH ULTRASOFT) lancets USE AS DIRECTED 3 TIMES A DAY 100 each 5  . magnesium oxide (MAG-OX) 400 MG tablet Take by mouth.    . meclizine (ANTIVERT) 25 MG tablet Take 1-2 tablets (25-50 mg total) by mouth 2 (two) times daily as needed for dizziness. if having symptoms.  May repeat in 12 hours later. 30 tablet 2  . metFORMIN (GLUCOPHAGE)  1000 MG tablet Take 1 tablet (1,000 mg total) by mouth 2 (two) times daily with a meal. 180 tablet 3  . Multiple Vitamin (MULTIVITAMIN) tablet Take 1 tablet by mouth daily.    . Omega-3 Fatty Acids (FISH OIL PO) Take 300 mg by mouth daily.     . pantoprazole (PROTONIX) 40 MG tablet Take 1 tablet (40 mg total) by mouth 2 (two) times daily. For two weeks, then go back to once daily. 90 tablet 2  . polyethylene glycol (MIRALAX / GLYCOLAX) packet Take 17 g by mouth every other day. 14 each 1  . sucralfate (CARAFATE) 1 GM/10ML suspension Take 10 mLs (1 g total) by mouth 4 (four) times daily -  with meals and at bedtime. 420 mL 0  . traMADol (ULTRAM) 50 MG tablet TAKE 1 TABLET BY MOUTH THREE TIMES DAILY AS NEEDED FOR  LOW BACK  PAIN 30 tablet 0  . vitamin B-12 (CYANOCOBALAMIN) 1000 MCG tablet Take 1,000 mcg by mouth daily.    . vitamin E 400 UNIT capsule Take 400 Units by mouth daily.    . hyoscyamine (LEVBID) 0.375 MG 12 hr tablet Take by mouth.     No current facility-administered medications for this visit.     Past Medical History:  Diagnosis Date  . Allergy   . Anxiety   . AR (allergic rhinitis)   . Asthma   . Cataract   . CHF (congestive heart failure) ([place])   . Colon polyps    hyperplastic  . Disturbance of skin sensation    facial paresthesia; left  . DM2 (diabetes mellitus, type 2) ([place])   . Dyslipidemia   . Fatty liver   . GERD (gastroesophageal reflux disease)   . Hemorrhoids   . Hyperlipidemia   . Hypertension    essential, benign   . Insomnia   . Menopausal syndrome   . Palpitations    hx  . Routine general medical examination at a health care facility   . Screening for malignant neoplasm of the cervix   . Skin lesion   . SVT (supraventricular tachycardia) ([place])   . Tobacco abuse   . Weakness     ROS:   All systems reviewed and negative except as noted in the HPI.   Past Surgical History:  Procedure Laterality Date  . ABDOMINAL HYSTERECTOMY  1990   For  fibroids benign.  Still has ovaries  . ablation for SVT  9/09   Dr. Lovena Le  . APPENDECTOMY    . CATARACT EXTRACTION, BILATERAL    . CESAREAN SECTION    . COLONOSCOPY  2009  . dental extractions    . HEMORRHOID BANDING  2014   w/Dr.Medoff  . SIGMOIDOSCOPY  2004     Family History  Problem Relation Age of Onset  . Heart attack Father 55  . Hypertension Mother   . Stroke Mother   . Diabetes Mother   . Stomach cancer Mother   . Cancer Mother   . Diabetes Brother   . Coronary artery disease Other        female, 1st degree relative <50  . Stomach cancer Maternal Aunt   . Diabetes Brother   . Colon cancer Neg Hx   . Esophageal cancer Neg Hx      Social History   Socioeconomic History  . Marital status: Married    Spouse name: Not on file  . Number of children: 3  . Years of education: 35  . Highest education level: Some college, no degree  Occupational History  . Occupation: Retired    Comment: Dance movement psychotherapist  Tobacco Use  . Smoking status: Current Every Day Smoker    Packs/day: 1.00    Types: Cigarettes  . Smokeless tobacco: Never Used  Vaping Use  . Vaping Use: Never used  Substance and Sexual Activity  . Alcohol use: Not Currently  . Drug use: No  . Sexual activity: Not Currently  Other Topics Concern  . Not on file  Social History Narrative   Married, does not get regular exercise. On disability.    2 miscarriages, one at 4 and one at 5 months; death at 1 day, unsure of cause.       Health Care POA: on file   Emergency Contact: Jeanene Erb (friend) [phone] Terri Piedra (friend's daughter) [phone]   End of Life Plan: Gave pt pamphlet   Lives in 2 story apartment. Handrails on stairs. Smoke alarms, has throw rugs with backing on them. No grab bars in bathroom.   Who lives with you: husband   Any pets: 0   Diet: pt has varied diet. Eats vegetables, some fruit, and meat.   Exercise: pt is disabled and does not have a  regular exercise routine  Seatbelts: Pt reports wearing seatbelt when in vehicle   Sun Exposure/Protection: Pt reports wearing a hat, sunglasses and facial sunscreen when outside   Hobbies: shopping    Social Determinants of Health   Financial Resource Strain:   . Difficulty of Paying Living Expenses: Not on file  Food Insecurity:   . Worried About Charity fundraiser in the Last Year: Not on file  . Ran Out of Food in the Last Year: Not on file  Transportation Needs:   . Lack of Transportation (Medical): Not on file  . Lack of Transportation (Non-Medical): Not on file  Physical Activity:   . Days of Exercise per Week: Not on file  . Minutes of Exercise per Session: Not on file  Stress:   . Feeling of Stress : Not on file  Social Connections:   . Frequency of Communication with Friends and Family: Not on file  . Frequency of Social Gatherings with Friends and Family: Not on file  . Attends Religious Services: Not on file  . Active Member of Clubs or Organizations: Not on file  . Attends Archivist Meetings: Not on file  . Marital Status: Not on file  Intimate Partner Violence:   . Fear of Current or Ex-Partner: Not on file  . Emotionally Abused: Not on file  . Physically Abused: Not on file  . Sexually Abused: Not on file     BP 134/72   Pulse 71   Ht 5' 4.5" (1.638 m)   Wt 156 lb (70.8 kg)   SpO2 98%   BMI 26.36 kg/m   Physical Exam:  Well appearing NAD HEENT: Unremarkable Neck:  No JVD, no thyromegally Lymphatics:  No adenopathy Back:  No CVA tenderness Lungs:  Clear with no wheezes HEART:  Regular rate rhythm, no murmurs, no rubs, no clicks Abd:  soft, positive bowel sounds, no organomegally, no rebound, no guarding Ext:  2 plus pulses, no edema, no cyanosis, no clubbing Skin:  No rashes no nodules Neuro:  CN II through XII intact, motor grossly intact   Assess/Plan: 1. SVT - she has been asymptomatic. Continue current meds. 2. HTN - her bp is  minimally elevated. I asked her to take valsartan 40 mg daily 3. Tobacco abuse - I encouraged her to stop smoking. 4. Weight loss - this has subsided. She is encouraged to stay where she is.  Carleene Overlie Nevyn Bossman,MD

## 2019-09-22 DIAGNOSIS — J301 Allergic rhinitis due to pollen: Secondary | ICD-10-CM | POA: Diagnosis not present

## 2019-09-22 DIAGNOSIS — J3089 Other allergic rhinitis: Secondary | ICD-10-CM | POA: Diagnosis not present

## 2019-09-27 DIAGNOSIS — J301 Allergic rhinitis due to pollen: Secondary | ICD-10-CM | POA: Diagnosis not present

## 2019-09-27 DIAGNOSIS — J3089 Other allergic rhinitis: Secondary | ICD-10-CM | POA: Diagnosis not present

## 2019-10-04 ENCOUNTER — Other Ambulatory Visit: Payer: Self-pay | Admitting: Family Medicine

## 2019-10-04 DIAGNOSIS — G8929 Other chronic pain: Secondary | ICD-10-CM

## 2019-10-06 DIAGNOSIS — J3089 Other allergic rhinitis: Secondary | ICD-10-CM | POA: Diagnosis not present

## 2019-10-06 DIAGNOSIS — J301 Allergic rhinitis due to pollen: Secondary | ICD-10-CM | POA: Diagnosis not present

## 2019-10-10 DIAGNOSIS — J301 Allergic rhinitis due to pollen: Secondary | ICD-10-CM | POA: Diagnosis not present

## 2019-10-10 DIAGNOSIS — J3089 Other allergic rhinitis: Secondary | ICD-10-CM | POA: Diagnosis not present

## 2019-10-11 ENCOUNTER — Ambulatory Visit: Payer: Medicare Other | Admitting: Internal Medicine

## 2019-10-11 ENCOUNTER — Encounter: Payer: Self-pay | Admitting: Internal Medicine

## 2019-10-11 VITALS — BP 140/78 | HR 48 | Ht 65.0 in | Wt 159.2 lb

## 2019-10-11 DIAGNOSIS — R143 Flatulence: Secondary | ICD-10-CM

## 2019-10-11 DIAGNOSIS — Z86010 Personal history of colonic polyps: Secondary | ICD-10-CM

## 2019-10-11 DIAGNOSIS — K219 Gastro-esophageal reflux disease without esophagitis: Secondary | ICD-10-CM

## 2019-10-11 DIAGNOSIS — E1165 Type 2 diabetes mellitus with hyperglycemia: Secondary | ICD-10-CM

## 2019-10-11 MED ORDER — METRONIDAZOLE 250 MG PO TABS
250.0000 mg | ORAL_TABLET | Freq: Three times a day (TID) | ORAL | 0 refills | Status: DC
Start: 2019-10-11 — End: 2019-10-12

## 2019-10-11 NOTE — Patient Instructions (Signed)
If you are age 67 or older, your body mass index should be between 23-30. Your Body mass index is 26.5 kg/m. If this is out of the aforementioned range listed, please consider follow up with your Primary Care Provider.  If you are age 46 or younger, your body mass index should be between 19-25. Your Body mass index is 26.5 kg/m. If this is out of the aformentioned range listed, please consider follow up with your Primary Care Provider.    We have sent the following medications to your pharmacy for you to pick up at your convenience:  Metronidazole

## 2019-10-11 NOTE — Progress Notes (Signed)
HISTORY OF PRESENT ILLNESS:  [name] is a 69 y.o. female with multiple medical problems who has been seen in this office for GERD, dysphagia, and screening colonoscopy.  She presents today with a chief complaint of increased intestinal gas in the form of flatus and hyperactive bowel sounds.  I last saw the patient August 04, 2017 when she underwent routine screening colonoscopy.  She was found to have diminutive adenomatous colon polyps.  Examination was otherwise normal.  Follow-up in 5 years.  It appears that she was seen at another GI clinic in the area (reports [facility]) regarding unintentional weight loss, abdominal bloating, and gas.  I have obtained the office note from the most recent visit February 28, 2019.  At that time it was said that the patient's symptoms had completely resolved with lactose elimination from her diet and antispasmodics on demand.  Patient tells me that at some point her symptoms return.  No longer appointment toes.  Tells me that she cannot find Lactaid.  No abdominal pain.  She reports 5 pound weight gain in the past 6 months.  She has been diabetic for 13 years.  She does take pantoprazole for her reflux disease.  This helps.  No issues with dysphagia, except for large pills.  Last EGD 2015 was normal.  Reviewed laboratories from July 2021 shows normal CBC with hemoglobin 13.1.  Her last hemoglobin A1c was elevated at 8.6 she has completed her Covid vaccination series.  She has been under a great deal of stress since the death of her husband 2019/03/04  REVIEW OF SYSTEMS:  All non-GI ROS negative unless otherwise stated in the HPI except for sinus allergy, anxiety, depression, cough, fatigue, muscle cramps, night sweats, sleeping problems  Past Medical History:  Diagnosis Date  . Allergy   . Anxiety   . AR (allergic rhinitis)   . Asthma   . Cataract   . CHF (congestive heart failure) ([place])   . Colon polyps    hyperplastic  . Disturbance of  skin sensation    facial paresthesia; left  . DM2 (diabetes mellitus, type 2) ([place])   . Dyslipidemia   . Fatty liver   . GERD (gastroesophageal reflux disease)   . Hemorrhoids   . Hyperlipidemia   . Hypertension    essential, benign   . Insomnia   . Menopausal syndrome   . Palpitations    hx  . Routine general medical examination at a health care facility   . Screening for malignant neoplasm of the cervix   . Skin lesion   . SVT (supraventricular tachycardia) ([place])   . Tobacco abuse   . Weakness     Past Surgical History:  Procedure Laterality Date  . ABDOMINAL HYSTERECTOMY  1990   For fibroids benign.  Still has ovaries  . ablation for SVT  9/09   Dr. Lovena Le  . APPENDECTOMY    . CATARACT EXTRACTION, BILATERAL    . CESAREAN SECTION    . COLONOSCOPY  2009  . dental extractions    . HEMORRHOID BANDING  2014   w/Dr.Medoff  . SIGMOIDOSCOPY  2004    Social History [name]  reports that she has been smoking cigarettes. She has been smoking about 1.00 pack per day. She has never used smokeless tobacco. She reports previous alcohol use. She reports that she does not use drugs.  family history includes Cancer in her mother; Coronary artery disease in an other family member; Diabetes  in her brother, brother, and mother; Heart attack (age of onset: 49) in her father; Hypertension in her mother; Stomach cancer in her maternal aunt and mother; Stroke in her mother.  Allergies  Allergen Reactions  . Alcohol Swabs [Isopropyl Alcohol] Nausea Only    Use peroxide instead  . Penicillins Other (See Comments)    Arm swelling  . Prednisone Other (See Comments)    Could not sleep  . Amoxicillin-Pot Clavulanate Other (See Comments)    She is unsure what reaction she had       PHYSICAL EXAMINATION: Vital signs: BP 140/78 (BP Location: Right Arm, Patient Position: Sitting, Cuff Size: Normal)   Pulse (!) 48   Ht 5\' 5"  (1.651 m)   Wt 159 lb 4 oz (72.2 kg)   BMI 26.50 kg/m    Constitutional: generally well-appearing, no acute distress Psychiatric: alert and oriented x3, cooperative Eyes: extraocular movements intact, anicteric, conjunctiva pink Mouth: oral pharynx moist, no lesions Neck: supple no lymphadenopathy Cardiovascular: heart regular rate and rhythm, no murmur Lungs: clear to auscultation bilaterally Abdomen: soft, nontender, nondistended, no obvious ascites, no peritoneal signs, normal bowel sounds, no organomegaly Rectal: Omitted Extremities: no pain, cyanosis, or lower extremity edema bilaterally Skin: no lesions on visible extremities Neuro: No focal deficits.  Cranial nerves intact  ASSESSMENT:  1.  Increased intestinal gas, principally flatus, and borborygmi.  No alarm features.  Question bacterial overgrowth long-term diabetic.  Seemingly does better with lactulose avoidance with Lactaid supplementation. 2.  GERD.  Normal EGD 2015.  Asymptomatic on pantoprazole 3.  History of diminutive adenomatous colon polyps 2019 4.  Multiple medical problems, including poorly controlled diabetes mellitus with hemoglobin A1c 8.6   PLAN:  1.  Prescribe metronidazole 250 mg 3 times daily 10 days 2.  Literature on increased intestinal gas provided.  Reviewed personally with the patient 3.  Gas and flatulence dietary sheet provided for her review 4.  Better diabetic control stressed 5.  Reflux precautions 6.  Continue PPI.  Lowest dose to control symptoms 7.  Surveillance colonoscopy around 2024 8.  Routine GI office follow-up 1 year A total time of 30 minutes was spent preparing to see the patient, reviewing test, reviewing outside office evaluations, obtaining comprehensive history, performing comprehensive physical examination, counseling the patient regarding her multiple above listed issues, ordering medications, and documenting clinical information in the health record

## 2019-10-12 ENCOUNTER — Telehealth: Payer: Self-pay | Admitting: Internal Medicine

## 2019-10-12 MED ORDER — METRONIDAZOLE 250 MG PO TABS
250.0000 mg | ORAL_TABLET | Freq: Three times a day (TID) | ORAL | 0 refills | Status: DC
Start: 2019-10-12 — End: 2020-02-16

## 2019-10-12 NOTE — Telephone Encounter (Signed)
Called pharmacy - they had inexpicably not received the Flagyl rx. Resent while on the phone - pharmacist confirmed receipt.

## 2019-10-20 DIAGNOSIS — J3089 Other allergic rhinitis: Secondary | ICD-10-CM | POA: Diagnosis not present

## 2019-10-20 DIAGNOSIS — J301 Allergic rhinitis due to pollen: Secondary | ICD-10-CM | POA: Diagnosis not present

## 2019-10-24 DIAGNOSIS — Z23 Encounter for immunization: Secondary | ICD-10-CM | POA: Diagnosis not present

## 2019-10-24 DIAGNOSIS — J3089 Other allergic rhinitis: Secondary | ICD-10-CM | POA: Diagnosis not present

## 2019-11-01 ENCOUNTER — Other Ambulatory Visit: Payer: Self-pay | Admitting: Family Medicine

## 2019-11-01 DIAGNOSIS — E0840 Diabetes mellitus due to underlying condition with diabetic neuropathy, unspecified: Secondary | ICD-10-CM

## 2019-11-02 DIAGNOSIS — J3089 Other allergic rhinitis: Secondary | ICD-10-CM | POA: Diagnosis not present

## 2019-11-02 DIAGNOSIS — J301 Allergic rhinitis due to pollen: Secondary | ICD-10-CM | POA: Diagnosis not present

## 2019-11-06 ENCOUNTER — Other Ambulatory Visit: Payer: Self-pay | Admitting: Family Medicine

## 2019-11-06 DIAGNOSIS — G8929 Other chronic pain: Secondary | ICD-10-CM

## 2019-11-06 DIAGNOSIS — M5440 Lumbago with sciatica, unspecified side: Secondary | ICD-10-CM

## 2019-11-07 ENCOUNTER — Encounter: Payer: Self-pay | Admitting: Family Medicine

## 2019-11-07 ENCOUNTER — Ambulatory Visit (INDEPENDENT_AMBULATORY_CARE_PROVIDER_SITE_OTHER): Payer: Medicare Other | Admitting: Family Medicine

## 2019-11-07 ENCOUNTER — Other Ambulatory Visit: Payer: Self-pay

## 2019-11-07 VITALS — BP 138/62 | HR 75 | Ht 65.0 in | Wt 159.0 lb

## 2019-11-07 DIAGNOSIS — R630 Anorexia: Secondary | ICD-10-CM

## 2019-11-07 DIAGNOSIS — I10 Essential (primary) hypertension: Secondary | ICD-10-CM | POA: Diagnosis not present

## 2019-11-07 DIAGNOSIS — E0840 Diabetes mellitus due to underlying condition with diabetic neuropathy, unspecified: Secondary | ICD-10-CM | POA: Diagnosis not present

## 2019-11-07 DIAGNOSIS — F17200 Nicotine dependence, unspecified, uncomplicated: Secondary | ICD-10-CM | POA: Diagnosis not present

## 2019-11-07 LAB — POCT GLYCOSYLATED HEMOGLOBIN (HGB A1C): Hemoglobin A1C: 7.5 % — AB (ref 4.0–5.6)

## 2019-11-07 NOTE — Patient Instructions (Addendum)
It was wonderful to see you today.  Please bring ALL of your medications with you to every visit.   Today we talked about:  Your diabetes. Your A1c improved good job!  Your blood pressure. It was with in good range today. Keep it up!  Loss of appetite as it pertains to eating foods you like within your diabetic diet. Attempt to find new food and occasionally treating yourself.   Quitting smoking. Please let me know when you are ready and we can assist you in your journey to quitting.   Please be sure to schedule 3 month follow up at the front desk before you leave today.   Please call the clinic at [phone] if your symptoms worsen or you have any concerns. It was our pleasure to serve you.  Dr. Janus Molder

## 2019-11-07 NOTE — Progress Notes (Signed)
    SUBJECTIVE:   [name] is a 69 yo F who presents for the following issues.   CHIEF COMPLAINT / HPI:  Diabetes Current Regimen:  CBGs: 90-250  Last A1c: 8.6 on 07/31/19  Denies polyuria, polydipsia, hypoglycemia Last Eye Exam: 11/10/18 Statin: Lipitor 40 mg  ACE/ARB: Valsartan 40 mg daily  Hypertension: - Medications: Valsartan 40 mg daily, atenolol 25 mg daily  - Compliance: Yes - Denies any SOB, CP, vision changes, LE edema, medication SEs, or symptoms of hypotension - Last cardiology appt 09/12/19  Loss of appetite Ongoing for 6 months. No improvement of appetite. Eats 2 meals a day and will eat at least 50% of the meal. Denies noticing unintentional weight loss. Has night sweats every since she was menopausal. Endorses she does not enjoy the foods she eats as it pertains to her diabetic diet and this is the issue. Denies issues with swallowing although she has a distant history of dysphagia. Recent GI visit for GERD and excessive gas and prescribed flagyl for bacterial overgrowth.  Smoking cessation  1pk/day. Not ready to quit. Has resources. Agrees to consider quitting soon since her late husband wanted her to.   PERTINENT  PMH / PSH: SVT (atenolol; controlled)  OBJECTIVE:   BP 138/62   Pulse 75   Ht 5\' 5"  (1.651 m)   Wt 159 lb (72.1 kg)   SpO2 96%   BMI 26.46 kg/m   General: Appears well, no acute distress. Age appropriate. Cardiac: RRR, normal heart sounds, no murmurs Respiratory: CTAB, normal effort Extremities: No edema or cyanosis.  Results for orders placed or performed in visit on 11/07/19 (from the past 48 hour(s))  HgB A1c     Status: Abnormal   Collection Time: 11/07/19 11:02 AM  Result Value Ref Range   Hemoglobin A1C 7.5 (A) 4.0 - 5.6 %   HbA1c POC (<> result, manual entry)     HbA1c, POC (prediabetic range)     HbA1c, POC (controlled diabetic range)     PHQ9 SCORE ONLY 11/07/2019 08/17/2019 07/31/2019  PHQ-9 Total Score 5 5 6     ASSESSMENT/PLAN:   Controlled diabetes mellitus with neurologic complication, without long-term current use of insulin (HCC) Improving. POCT A1c 7.5. Consider discontinuing glipizide and adding SGLT-2i or GLP-1 ag in the future.  -Continue current medications -F/u in 3 months for A1c repeat  Primary hypertension BP at goal. No episode of hypotension.  -Continue current medications  Loss of appetite Ongoing. No red flags such as concern for malignancy or depression at this time. UTD colon cancer screening negative. Recent GI visit may be contributory to improvement with dietary changes and treatment for bacterial overgrowth. Depression screening with mild depression score. Likely needs to increase variety of foods that adhere with diabetic diet and allowing food choices outside of diet sparingly but occasionally.  -Monitor for improvement with subsequent visits for the need for further evaluation  Tobacco use disorder Current smoker. Not ready to quit. Contemplating. Resources offered; decline to patient receiving resources prior by [facility].    Gerlene Fee, [place]

## 2019-11-08 DIAGNOSIS — J301 Allergic rhinitis due to pollen: Secondary | ICD-10-CM | POA: Diagnosis not present

## 2019-11-08 DIAGNOSIS — J3089 Other allergic rhinitis: Secondary | ICD-10-CM | POA: Diagnosis not present

## 2019-11-09 DIAGNOSIS — R630 Anorexia: Secondary | ICD-10-CM | POA: Insufficient documentation

## 2019-11-09 NOTE — Assessment & Plan Note (Addendum)
Ongoing. No red flags such as concern for malignancy or depression at this time. UTD colon cancer screening negative. Recent GI visit may be contributory to improvement with dietary changes and treatment for bacterial overgrowth. Depression screening with mild depression score. Likely needs to increase variety of foods that adhere with diabetic diet and allowing food choices outside of diet sparingly but occasionally.  -Monitor for improvement with subsequent visits for the need for further evaluation

## 2019-11-09 NOTE — Assessment & Plan Note (Signed)
Current smoker. Not ready to quit. Contemplating. Resources offered; decline to patient receiving resources prior by [facility].

## 2019-11-09 NOTE — Assessment & Plan Note (Signed)
BP at goal. No episode of hypotension.  -Continue current medications

## 2019-11-09 NOTE — Assessment & Plan Note (Signed)
Improving. POCT A1c 7.5. Consider discontinuing glipizide and adding SGLT-2i or GLP-1 ag in the future.  -Continue current medications -F/u in 3 months for A1c repeat

## 2019-11-11 ENCOUNTER — Encounter (HOSPITAL_COMMUNITY): Payer: Self-pay | Admitting: Emergency Medicine

## 2019-11-11 ENCOUNTER — Other Ambulatory Visit: Payer: Self-pay

## 2019-11-11 ENCOUNTER — Emergency Department (HOSPITAL_COMMUNITY)
Admission: EM | Admit: 2019-11-11 | Discharge: 2019-11-12 | Disposition: A | Discharge status: Home / Self Care | Payer: Medicare Other | Attending: Emergency Medicine | Admitting: Emergency Medicine

## 2019-11-11 DIAGNOSIS — X58XXXA Exposure to other specified factors, initial encounter: Secondary | ICD-10-CM | POA: Insufficient documentation

## 2019-11-11 DIAGNOSIS — E1149 Type 2 diabetes mellitus with other diabetic neurological complication: Secondary | ICD-10-CM | POA: Diagnosis not present

## 2019-11-11 DIAGNOSIS — Z7984 Long term (current) use of oral hypoglycemic drugs: Secondary | ICD-10-CM | POA: Diagnosis not present

## 2019-11-11 DIAGNOSIS — F17210 Nicotine dependence, cigarettes, uncomplicated: Secondary | ICD-10-CM | POA: Insufficient documentation

## 2019-11-11 DIAGNOSIS — I110 Hypertensive heart disease with heart failure: Secondary | ICD-10-CM | POA: Diagnosis not present

## 2019-11-11 DIAGNOSIS — I509 Heart failure, unspecified: Secondary | ICD-10-CM | POA: Diagnosis not present

## 2019-11-11 DIAGNOSIS — J45909 Unspecified asthma, uncomplicated: Secondary | ICD-10-CM | POA: Diagnosis not present

## 2019-11-11 DIAGNOSIS — T189XXA Foreign body of alimentary tract, part unspecified, initial encounter: Secondary | ICD-10-CM | POA: Insufficient documentation

## 2019-11-11 DIAGNOSIS — Z7982 Long term (current) use of aspirin: Secondary | ICD-10-CM | POA: Insufficient documentation

## 2019-11-11 DIAGNOSIS — Z79899 Other long term (current) drug therapy: Secondary | ICD-10-CM | POA: Diagnosis not present

## 2019-11-11 DIAGNOSIS — T17200A Unspecified foreign body in pharynx causing asphyxiation, initial encounter: Secondary | ICD-10-CM | POA: Diagnosis not present

## 2019-11-11 DIAGNOSIS — I878 Other specified disorders of veins: Secondary | ICD-10-CM | POA: Diagnosis not present

## 2019-11-11 DIAGNOSIS — Z8616 Personal history of COVID-19: Secondary | ICD-10-CM | POA: Insufficient documentation

## 2019-11-11 NOTE — ED Triage Notes (Signed)
Pt sts she accidentally swallowed a small cap off of a lancet this am.  Pt sts feels like it is still in her throat.  Pt can eat and drink without any problems

## 2019-11-12 ENCOUNTER — Emergency Department (HOSPITAL_COMMUNITY): Payer: Medicare Other

## 2019-11-12 DIAGNOSIS — T189XXA Foreign body of alimentary tract, part unspecified, initial encounter: Secondary | ICD-10-CM | POA: Diagnosis not present

## 2019-11-12 DIAGNOSIS — I878 Other specified disorders of veins: Secondary | ICD-10-CM | POA: Diagnosis not present

## 2019-11-12 DIAGNOSIS — T17200A Unspecified foreign body in pharynx causing asphyxiation, initial encounter: Secondary | ICD-10-CM | POA: Diagnosis not present

## 2019-11-12 MED ORDER — LIDOCAINE VISCOUS HCL 2 % MT SOLN
15.0000 mL | Freq: Once | OROMUCOSAL | Status: AC
  Administered 2019-11-12: 15 mL via OROMUCOSAL
  Filled 2019-11-12: qty 15

## 2019-11-12 NOTE — ED Notes (Signed)
Pt is d/c by MD and is provided w/ d.c instructions and follow up care, pt is out of the ED in wheel chair

## 2019-11-12 NOTE — ED Provider Notes (Signed)
[facility] EMERGENCY DEPARTMENT Provider Note   CSN: [id number] Arrival date & time: 11/11/19  1924     History Chief Complaint  Patient presents with  . Swallowed Foreign Object    [name] is a 69 y.o. female.  Patient presents to the emergency department for evaluation of swallowed foreign body.  Patient reports that she was checking her blood sugar this morning, was holding the removable tab at the end of her lancet in her mouth and accidentally swallowed it.  She reports that she does feel discomfort when she swallows but has been drinking and eating multiple meals since then without difficulty.  No difficulty breathing.        Past Medical History:  Diagnosis Date  . Allergy   . Anxiety   . AR (allergic rhinitis)   . Asthma   . Cataract   . CHF (congestive heart failure) ([place])   . Colon polyps    hyperplastic  . Disturbance of skin sensation    facial paresthesia; left  . DM2 (diabetes mellitus, type 2) ([place])   . Dyslipidemia   . Fatty liver   . GERD (gastroesophageal reflux disease)   . Hemorrhoids   . Hyperlipidemia   . Hypertension    essential, benign   . Insomnia   . Menopausal syndrome   . Palpitations    hx  . Routine general medical examination at a health care facility   . Screening for malignant neoplasm of the cervix   . Skin lesion   . SVT (supraventricular tachycardia) ([place])   . Tobacco abuse   . Weakness     Patient Active Problem List   Diagnosis Date Noted  . Loss of appetite 11/09/2019  . Close exposure to COVID-19 virus 08/21/2019  . Hypotension due to medication 07/21/2019  . SVT (supraventricular tachycardia) ([place]) 03/10/2019  . Nodule of anterior chest wall 11/12/2018  . Fatigue 09/20/2018  . Shoulder mass 08/22/2016  . Eczema 05/07/2015  . Hepatic steatosis 05/22/2014  . Chronic low back pain 05/21/2014  . S/P ablation of atrial flutter 01/09/2014  . Dysphagia 02/24/2013  . Extrinsic asthma  09/29/2012  . Onychomycosis 06/10/2012  . Tobacco use disorder 11/03/2007  . Primary hypertension 10/18/2007  . PTSD (post-traumatic stress disorder) 10/03/2007  . Controlled diabetes mellitus with neurologic complication, without long-term current use of insulin ([place]) 08/18/2007  . Advanced aortic atherosclerosis ([place]) 02/17/2007  . Allergic rhinitis 07/21/2006  . GERD 07/21/2006    Past Surgical History:  Procedure Laterality Date  . ABDOMINAL HYSTERECTOMY  1990   For fibroids benign.  Still has ovaries  . ablation for SVT  9/09   Dr. Lovena Le  . APPENDECTOMY    . CATARACT EXTRACTION, BILATERAL    . CESAREAN SECTION    . COLONOSCOPY  2009  . dental extractions    . HEMORRHOID BANDING  2014   w/Dr.Medoff  . SIGMOIDOSCOPY  2004     OB History   No obstetric history on file.     Family History  Problem Relation Age of Onset  . Heart attack Father 76  . Hypertension Mother   . Stroke Mother   . Diabetes Mother   . Stomach cancer Mother   . Cancer Mother   . Diabetes Brother   . Coronary artery disease Other        female, 1st degree relative <50  . Stomach cancer Maternal Aunt   . Diabetes Brother   . Colon cancer  Neg Hx   . Esophageal cancer Neg Hx     Social History   Tobacco Use  . Smoking status: Current Every Day Smoker    Packs/day: 1.00    Types: Cigarettes  . Smokeless tobacco: Never Used  Vaping Use  . Vaping Use: Never used  Substance Use Topics  . Alcohol use: Not Currently  . Drug use: No    Home Medications Prior to Admission medications   Medication Sig Start Date End Date Taking? Authorizing Provider  Accu-Chek Softclix Lancets lancets USE AS DIRECTED 3 TIMES A DAY 11/01/19   [name], [name], DO  acetaminophen (TYLENOL) 500 MG tablet Take 500 mg by mouth every 6 (six) hours as needed.    [provider]  aspirin 81 MG chewable tablet Chew 1 tablet (81 mg total) by mouth daily. 03/02/17   Rogue Bussing, MD  atenolol  (TENORMIN) 25 MG tablet TAKE ONE TABLET BY MOUTH DAILY 06/27/19   Nuala Alpha, DO  atorvastatin (LIPITOR) 40 MG tablet Take 1 tablet (40 mg total) by mouth daily. 06/27/19   Nuala Alpha, DO  baclofen (LIORESAL) 10 MG tablet Take 1 tablet (10 mg total) by mouth daily as needed for muscle spasms. 08/31/18   Nuala Alpha, DO  citalopram (CELEXA) 20 MG tablet Take 1 tablet (20 mg total) by mouth daily. 06/27/19   Nuala Alpha, DO  EPINEPHrine 0.3 mg/0.3 mL IJ SOAJ injection USE AS DIRECTED AS NEEDED FOR SYSTEMIC REACTION 02/11/18   [provider]  gabapentin (NEURONTIN) 300 MG capsule TAKE 3 CAPSULES BY MOUTH TWICE A DAY AS NEEDED 08/31/18   Lockamy, Timothy, DO  glipiZIDE (GLUCOTROL) 10 MG tablet TAKE ONE TABLET BY MOUTH TWICE A DAY BEFORE A MEAL 06/27/19   Lockamy, Timothy, DO  glucose blood test strip Use as instructed 09/30/18   Lockamy, Timothy, DO  magnesium oxide (MAG-OX) 400 MG tablet Take by mouth.    [provider]  meclizine (ANTIVERT) 25 MG tablet Take 1-2 tablets (25-50 mg total) by mouth 2 (two) times daily as needed for dizziness. if having symptoms.  May repeat in 12 hours later. 03/02/17   Rogue Bussing, MD  metFORMIN (GLUCOPHAGE) 1000 MG tablet Take 1 tablet (1,000 mg total) by mouth 2 (two) times daily with a meal. 06/27/19   Lockamy, Timothy, DO  metroNIDAZOLE (FLAGYL) 250 MG tablet Take 1 tablet (250 mg total) by mouth 3 (three) times daily. Patient not taking: Reported on 11/07/2019 10/12/19   Irene Shipper, MD  Multiple Vitamin (MULTIVITAMIN) tablet Take 1 tablet by mouth daily. Patient not taking: Reported on 11/07/2019    [provider]  Omega-3 Fatty Acids (FISH OIL PO) Take 300 mg by mouth daily.  Patient not taking: Reported on 11/07/2019    [provider]  pantoprazole (PROTONIX) 40 MG tablet Take 1 tablet (40 mg total) by mouth 2 (two) times daily. For two weeks, then go back to once daily. 06/27/19   Nuala Alpha, DO  polyethylene glycol (MIRALAX / GLYCOLAX) packet Take 17 g by mouth every other day. Patient not taking: Reported on 11/07/2019 03/02/17   Rogue Bussing, MD  sucralfate (CARAFATE) 1 GM/10ML suspension Take 10 mLs (1 g total) by mouth 4 (four) times daily -  with meals and at bedtime. 06/27/19   Lockamy, Christia [place], DO  traMADol (ULTRAM) 50 MG tablet TAKE 1 TABLET BY MOUTH THREE TIMES DAILY AS NEEDED FOR  LOW  BACK  PAIN 11/08/19   [name],  Simone, DO  valsartan (DIOVAN) 40 MG tablet Take 1 tablet (40 mg total) by mouth daily. 09/12/19   Evans Lance, MD  vitamin B-12 (CYANOCOBALAMIN) 1000 MCG tablet Take 1,000 mcg by mouth daily. Patient not taking: Reported on 11/07/2019    [provider]  vitamin E 400 UNIT capsule Take 400 Units by mouth daily. Patient not taking: Reported on 11/07/2019    [provider]  Lancets [facility] ULTRASOFT) lancets Use as directed to test glucose 3/day.  Dx code: E11.9 08/31/18   Nuala Alpha, DO    Allergies    Alcohol swabs [isopropyl alcohol], Penicillins, Prednisone, and Amoxicillin-pot clavulanate  Review of Systems   Review of Systems  HENT: Positive for sore throat. Negative for trouble swallowing and voice change.   All other systems reviewed and are negative.   Physical Exam Updated Vital Signs BP (!) 157/80 (BP Location: Right Arm)   Pulse 93   Temp 98.2 F (36.8 C) (Oral)   Resp 18   Ht 5\' 5"  (1.651 m)   Wt 72.1 kg   SpO2 99%   BMI 26.46 kg/m   Physical Exam Vitals and nursing note reviewed.  Constitutional:      General: She is not in acute distress.    Appearance: Normal appearance. She is well-developed.  HENT:     Head: Normocephalic and atraumatic.     Right Ear: Hearing normal.     Left Ear: Hearing normal.     Nose: Nose normal.  Eyes:     Conjunctiva/sclera: Conjunctivae normal.     Pupils: Pupils are equal, round, and reactive to light.  Cardiovascular:     Rate and Rhythm:  Regular rhythm.     Heart sounds: S1 normal and S2 normal. No murmur heard.  No friction rub. No gallop.   Pulmonary:     Effort: Pulmonary effort is normal. No respiratory distress.     Breath sounds: Normal breath sounds.  Chest:     Chest wall: No tenderness.  Abdominal:     General: Bowel sounds are normal.     Palpations: Abdomen is soft.     Tenderness: There is no abdominal tenderness. There is no guarding or rebound. Negative signs include Murphy's sign and McBurney's sign.     Hernia: No hernia is present.  Musculoskeletal:        General: Normal range of motion.     Cervical back: Normal range of motion and neck supple.  Skin:    General: Skin is warm and dry.     Findings: No rash.  Neurological:     Mental Status: She is alert and oriented to person, place, and time.     GCS: GCS eye subscore is 4. GCS verbal subscore is 5. GCS motor subscore is 6.     Cranial Nerves: No cranial nerve deficit.     Sensory: No sensory deficit.     Coordination: Coordination normal.  Psychiatric:        Speech: Speech normal.        Behavior: Behavior normal.        Thought Content: Thought content normal.     ED Results / Procedures / Treatments   Labs (all labs ordered are listed, but only abnormal results are displayed) Labs Reviewed - No data to display  EKG None  Radiology DG Neck Soft Tissue  Result Date: 11/12/2019 CLINICAL DATA:  Aspirated foreign body EXAM: NECK SOFT TISSUES - 1+ VIEW COMPARISON:  None.  FINDINGS: There is no evidence of retropharyngeal soft tissue swelling or epiglottic enlargement. The cervical airway is unremarkable and no radio-opaque foreign body identified. Stable phleboliths noted at the right neck base. IMPRESSION: Negative. Electronically Signed   By: Fidela Salisbury MD   On: 11/12/2019 00:24    Procedures Procedures (including critical care time)  Medications Ordered in ED Medications  lidocaine (XYLOCAINE) 2 % viscous mouth solution 15  mL (has no administration in time range)    ED Course  I have reviewed the triage vital signs and the nursing notes.  Pertinent labs & imaging results that were available during my care of the patient were reviewed by me and considered in my medical decision making (see chart for details).    MDM Rules/Calculators/A&P                          Patient with discomfort after accidentally swallowing a small piece of plastic.  She swelled the removal and of the lancet that she uses to check her blood sugar.  She showed me one of the devices, the object she swallowed is approximately 5 mm in diameter and quite thin.  She is in no distress.  Oropharyngeal examination normal.  Soft tissue neck normal.  She is not having any difficulty breathing.  She reports that she felt to go down when she swallowed, did not inhale it.  No stridor on exam.  Patient experiencing some discomfort as the object likely scratched her throat on the way down but there is no concern for any significant injury.  Administered viscous lidocaine and reassured.  No further intervention necessary. Final Clinical Impression(s) / ED Diagnoses Final diagnoses:  Swallowed foreign body, initial encounter    Rx / DC Orders ED Discharge Orders    None       Cade Dashner, Gwenyth Allegra, MD 11/12/19 [phone]

## 2019-11-16 DIAGNOSIS — E119 Type 2 diabetes mellitus without complications: Secondary | ICD-10-CM | POA: Diagnosis not present

## 2019-11-16 DIAGNOSIS — J301 Allergic rhinitis due to pollen: Secondary | ICD-10-CM | POA: Diagnosis not present

## 2019-11-16 DIAGNOSIS — J3089 Other allergic rhinitis: Secondary | ICD-10-CM | POA: Diagnosis not present

## 2019-11-16 DIAGNOSIS — Z961 Presence of intraocular lens: Secondary | ICD-10-CM | POA: Diagnosis not present

## 2019-11-16 DIAGNOSIS — H524 Presbyopia: Secondary | ICD-10-CM | POA: Diagnosis not present

## 2019-11-16 LAB — HM DIABETES EYE EXAM

## 2019-11-18 ENCOUNTER — Other Ambulatory Visit: Payer: Self-pay | Admitting: Family Medicine

## 2019-11-18 DIAGNOSIS — E0840 Diabetes mellitus due to underlying condition with diabetic neuropathy, unspecified: Secondary | ICD-10-CM

## 2019-11-18 DIAGNOSIS — I10 Essential (primary) hypertension: Secondary | ICD-10-CM

## 2019-11-23 ENCOUNTER — Other Ambulatory Visit: Payer: Self-pay | Admitting: Family Medicine

## 2019-11-23 DIAGNOSIS — I10 Essential (primary) hypertension: Secondary | ICD-10-CM

## 2019-11-24 DIAGNOSIS — J301 Allergic rhinitis due to pollen: Secondary | ICD-10-CM | POA: Diagnosis not present

## 2019-11-24 DIAGNOSIS — J3081 Allergic rhinitis due to animal (cat) (dog) hair and dander: Secondary | ICD-10-CM | POA: Diagnosis not present

## 2019-11-24 DIAGNOSIS — J3089 Other allergic rhinitis: Secondary | ICD-10-CM | POA: Diagnosis not present

## 2019-11-30 DIAGNOSIS — J301 Allergic rhinitis due to pollen: Secondary | ICD-10-CM | POA: Diagnosis not present

## 2019-11-30 DIAGNOSIS — J3089 Other allergic rhinitis: Secondary | ICD-10-CM | POA: Diagnosis not present

## 2019-12-02 ENCOUNTER — Other Ambulatory Visit: Payer: Self-pay | Admitting: Family Medicine

## 2019-12-02 DIAGNOSIS — E0840 Diabetes mellitus due to underlying condition with diabetic neuropathy, unspecified: Secondary | ICD-10-CM

## 2019-12-04 ENCOUNTER — Ambulatory Visit: Payer: Medicare Other | Admitting: Podiatry

## 2019-12-05 DIAGNOSIS — J301 Allergic rhinitis due to pollen: Secondary | ICD-10-CM | POA: Diagnosis not present

## 2019-12-05 DIAGNOSIS — J3089 Other allergic rhinitis: Secondary | ICD-10-CM | POA: Diagnosis not present

## 2019-12-11 ENCOUNTER — Other Ambulatory Visit: Payer: Self-pay | Admitting: Family Medicine

## 2019-12-11 DIAGNOSIS — G8929 Other chronic pain: Secondary | ICD-10-CM

## 2019-12-12 DIAGNOSIS — J301 Allergic rhinitis due to pollen: Secondary | ICD-10-CM | POA: Diagnosis not present

## 2019-12-12 DIAGNOSIS — J3089 Other allergic rhinitis: Secondary | ICD-10-CM | POA: Diagnosis not present

## 2019-12-19 ENCOUNTER — Ambulatory Visit: Payer: Medicare Other | Admitting: Podiatry

## 2019-12-21 DIAGNOSIS — J301 Allergic rhinitis due to pollen: Secondary | ICD-10-CM | POA: Diagnosis not present

## 2019-12-21 DIAGNOSIS — J3089 Other allergic rhinitis: Secondary | ICD-10-CM | POA: Diagnosis not present

## 2019-12-25 DIAGNOSIS — J301 Allergic rhinitis due to pollen: Secondary | ICD-10-CM | POA: Diagnosis not present

## 2019-12-25 DIAGNOSIS — J3089 Other allergic rhinitis: Secondary | ICD-10-CM | POA: Diagnosis not present

## 2019-12-27 DIAGNOSIS — J301 Allergic rhinitis due to pollen: Secondary | ICD-10-CM | POA: Diagnosis not present

## 2019-12-27 DIAGNOSIS — J3089 Other allergic rhinitis: Secondary | ICD-10-CM | POA: Diagnosis not present

## 2020-01-01 ENCOUNTER — Other Ambulatory Visit: Payer: Self-pay | Admitting: *Deleted

## 2020-01-01 DIAGNOSIS — K219 Gastro-esophageal reflux disease without esophagitis: Secondary | ICD-10-CM

## 2020-01-01 MED ORDER — PANTOPRAZOLE SODIUM 40 MG PO TBEC: 40.0000 mg | DELAYED_RELEASE_TABLET | Freq: Two times a day (BID) | ORAL | 2 refills | Status: DC

## 2020-01-01 NOTE — Addendum Note (Signed)
Addended by: Caralee Ates on: 01/01/2020 01:56 PM   Modules accepted: Orders

## 2020-01-02 DIAGNOSIS — J301 Allergic rhinitis due to pollen: Secondary | ICD-10-CM | POA: Diagnosis not present

## 2020-01-02 DIAGNOSIS — J3089 Other allergic rhinitis: Secondary | ICD-10-CM | POA: Diagnosis not present

## 2020-01-04 ENCOUNTER — Other Ambulatory Visit: Payer: Self-pay | Admitting: Family Medicine

## 2020-01-04 DIAGNOSIS — G8929 Other chronic pain: Secondary | ICD-10-CM

## 2020-01-04 DIAGNOSIS — M5440 Lumbago with sciatica, unspecified side: Secondary | ICD-10-CM

## 2020-01-09 DIAGNOSIS — Z1231 Encounter for screening mammogram for malignant neoplasm of breast: Secondary | ICD-10-CM | POA: Diagnosis not present

## 2020-01-10 DIAGNOSIS — J301 Allergic rhinitis due to pollen: Secondary | ICD-10-CM | POA: Diagnosis not present

## 2020-01-10 DIAGNOSIS — J3089 Other allergic rhinitis: Secondary | ICD-10-CM | POA: Diagnosis not present

## 2020-01-18 DIAGNOSIS — J301 Allergic rhinitis due to pollen: Secondary | ICD-10-CM | POA: Diagnosis not present

## 2020-01-18 DIAGNOSIS — J3089 Other allergic rhinitis: Secondary | ICD-10-CM | POA: Diagnosis not present

## 2020-01-25 DIAGNOSIS — J301 Allergic rhinitis due to pollen: Secondary | ICD-10-CM | POA: Diagnosis not present

## 2020-01-25 DIAGNOSIS — J3089 Other allergic rhinitis: Secondary | ICD-10-CM | POA: Diagnosis not present

## 2020-01-31 DIAGNOSIS — J3089 Other allergic rhinitis: Secondary | ICD-10-CM | POA: Diagnosis not present

## 2020-01-31 DIAGNOSIS — J301 Allergic rhinitis due to pollen: Secondary | ICD-10-CM | POA: Diagnosis not present

## 2020-02-07 ENCOUNTER — Other Ambulatory Visit: Payer: Self-pay | Admitting: Family Medicine

## 2020-02-07 DIAGNOSIS — G8929 Other chronic pain: Secondary | ICD-10-CM

## 2020-02-07 DIAGNOSIS — M5440 Lumbago with sciatica, unspecified side: Secondary | ICD-10-CM

## 2020-02-08 NOTE — Progress Notes (Deleted)
    SUBJECTIVE:   CHIEF COMPLAINT / HPI:   Diabetes Current Regimen: *** CBGs: ***  Last A1c: *** on ***  Denies polyuria, polydipsia, hypoglycemia *** Last Eye Exam: *** Statin: *** ACE/ARB: ***  PERTINENT  PMH / PSH: ***  OBJECTIVE:   There were no vitals taken for this visit.  ***  ASSESSMENT/PLAN:   No problem-specific Assessment & Plan notes found for this encounter.     Tammy Hankerson [name], DO [facility]   

## 2020-02-09 ENCOUNTER — Other Ambulatory Visit: Payer: Self-pay

## 2020-02-09 ENCOUNTER — Ambulatory Visit (INDEPENDENT_AMBULATORY_CARE_PROVIDER_SITE_OTHER): Payer: Medicare Other | Admitting: Family Medicine

## 2020-02-09 VITALS — BP 112/60 | HR 79 | Ht 65.0 in | Wt 163.0 lb

## 2020-02-09 DIAGNOSIS — G8929 Other chronic pain: Secondary | ICD-10-CM

## 2020-02-09 DIAGNOSIS — M5440 Lumbago with sciatica, unspecified side: Secondary | ICD-10-CM | POA: Diagnosis not present

## 2020-02-09 DIAGNOSIS — J069 Acute upper respiratory infection, unspecified: Secondary | ICD-10-CM

## 2020-02-09 DIAGNOSIS — E0840 Diabetes mellitus due to underlying condition with diabetic neuropathy, unspecified: Secondary | ICD-10-CM | POA: Diagnosis not present

## 2020-02-09 LAB — POCT GLYCOSYLATED HEMOGLOBIN (HGB A1C): Hemoglobin A1C: 7.9 % — AB (ref 4.0–5.6)

## 2020-02-09 NOTE — Progress Notes (Signed)
    SUBJECTIVE:   CHIEF COMPLAINT / HPI:  Diabetes checkup  Viral URI Patient reports that she has had symptoms including rhinorrhea, cough, congestion for approximately 1 week.  She thought that these were allergy symptoms so she called the allergy specialist who told her that she could not come in unless she had a Covid test.  She would like a Covid test today.  Denies any fever, chills, nausea, vomiting.  Diabetes checkup Patient reports that her blood sugars have been doing well and that she has been compliant with her medications.  Denies any episodes of hypoglycemia.  Most recent hemoglobin was on 11/07/2019 and was 7.5.  She had a diabetic eye exam on 11/16/2019 which showed no retinopathy.    OBJECTIVE:   BP 112/60   Pulse 79   Ht 5\' 5"  (1.651 m)   Wt 163 lb (73.9 kg)   SpO2 98%   BMI 27.12 kg/m   General: Well-appearing 70 year old female in no acute distress HEENT: Erythema of nasal turbinates, rhinorrhea present, mild posterior oropharynx erythema Cardiac: Regular rate and rhythm, no murmurs appreciated Respiratory: Normal work of breathing, lungs clear to auscultation bilaterally Abdomen: Soft, nontender, positive bowel sounds  ASSESSMENT/PLAN:   Controlled diabetes mellitus with neurologic complication, without long-term current use of insulin (HCC) Hemoglobin A1c mildly elevated from previous check at 7.9 today from 7.5 previously.  Patient would most likely benefit from SGLT 2 or GLP-1 in the future.  Recommended patient try Ozempic but she will need training for this and due to her symptoms of a viral URI and possible Covid we will hold off at this time. -Continue on current diabetic regimen -Follow-up in 3 months for repeat A1c -Consider Ozempic at next visit  Viral URI Patient with signs and symptoms consistent with viral URI.  This may also be allergies.  Her allergy provider will not see her until Covid test has been collected.  Covid test collected today.   Will call patient with the results.  Recommended quarantine and provided supportive care recommendations.  Strict ED precautions given as well.     Gifford Shave, MD [place]

## 2020-02-09 NOTE — Patient Instructions (Signed)
It was a pleasure seeing you today.  Regarding your diabetes your hemoglobin A1c is 7.9.  I think you would be a great patient for a medication called Ozempic but we have to do teaching for the medication and because we are concerned you may have Covid they cannot do the teaching today.  At your next hemoglobin A1c and diabetes checkup in 3 months we can discuss this further.  Regarding your cough, congestion, runny nose I think this is most likely due to a viral upper respiratory infection.  The symptoms may persist for several weeks.  You can treat the symptoms with over-the-counter cough syrups and antihistamine such as Zyrtec.  You can also use hot tea with honey for your sore throat and over-the-counter Tylenol for pain discomfort, fever.  We went ahead and collected a Covid test today.  If you have any worsening symptoms, shortness of breath please seek medical attention immediately.  I hope you have a wonderful afternoon and get to feeling better!

## 2020-02-11 DIAGNOSIS — J069 Acute upper respiratory infection, unspecified: Secondary | ICD-10-CM | POA: Insufficient documentation

## 2020-02-11 LAB — SARS-COV-2, NAA 2 DAY TAT

## 2020-02-11 LAB — NOVEL CORONAVIRUS, NAA: SARS-CoV-2, NAA: NOT DETECTED

## 2020-02-11 NOTE — Assessment & Plan Note (Signed)
Hemoglobin A1c mildly elevated from previous check at 7.9 today from 7.5 previously.  Patient would most likely benefit from SGLT 2 or GLP-1 in the future.  Recommended patient try Ozempic but she will need training for this and due to her symptoms of a viral URI and possible Covid we will hold off at this time. -Continue on current diabetic regimen -Follow-up in 3 months for repeat A1c -Consider Ozempic at next visit

## 2020-02-11 NOTE — Assessment & Plan Note (Signed)
Patient with signs and symptoms consistent with viral URI.  This may also be allergies.  Her allergy provider will not see her until Covid test has been collected.  Covid test collected today.  Will call patient with the results.  Recommended quarantine and provided supportive care recommendations.  Strict ED precautions given as well.

## 2020-02-13 DIAGNOSIS — K219 Gastro-esophageal reflux disease without esophagitis: Secondary | ICD-10-CM | POA: Diagnosis not present

## 2020-02-13 DIAGNOSIS — H1045 Other chronic allergic conjunctivitis: Secondary | ICD-10-CM | POA: Diagnosis not present

## 2020-02-13 DIAGNOSIS — J3089 Other allergic rhinitis: Secondary | ICD-10-CM | POA: Diagnosis not present

## 2020-02-13 DIAGNOSIS — J4520 Mild intermittent asthma, uncomplicated: Secondary | ICD-10-CM | POA: Diagnosis not present

## 2020-02-15 NOTE — Progress Notes (Signed)
    SUBJECTIVE:   CHIEF COMPLAINT / HPI:   HTN Current regimen: Atenolol 25 mg daily, valsartan 40 mg daily, HCTZ 25mg  (was told to stop by Dr. Susa Simmonds in 07/2019, but hasn't) Couldn't check BPs at home Had some dizziness 3 days ago, stopped taking valsartan and atorvastatin  Dizziness went away, no symptoms now No chest pain, SOB  Vit B 12 Deficiency Patient has been taking Vit B12 for prior deficiency, but would like to know if she is able to stop this  PERTINENT  PMH / PSH: Advanced aortic atherosclerosis, primary HTN, SVT, hx hypotension, allergic rhinitis, asthma, hepatic steatosis, GERD, T2DM, PTSD, Tobacco Use Disorder, atrial flutter s/p ablation  OBJECTIVE:   BP 130/72   Pulse 76   Wt 160 lb 9.6 oz (72.8 kg)   SpO2 95%   BMI 26.73 kg/m    Physical Exam:  General: 70 y.o. female in NAD Cardio: RRR no m/r/g Lungs: CTAB, no wheezing, no rhonchi, no crackles, no IWOB on RA Skin: warm and dry Extremities: No edema, ambulating without difficulty   ASSESSMENT/PLAN:   Primary hypertension Dizziness resolved.  Systolic blood pressure 878 at last visit.  Today she is at 130/72 with discontinuing valsartan.  She was supposed to discontinue hydrochlorothiazide in July 2021 but has not.  Given lack of benefit from this medication, will go ahead and discontinue it now.  We will also restart patient at half dose valsartan 20 mg.  We will have her follow-up in 2 to 4 weeks and obtain a BMP today.  If she is able to tolerate an increase in her valsartan to 40 mg, would do this.  Given that her cardiologist prescribes atenolol, will not make changes to this, but would recommend consideration of change to metoprolol.  She is to return sooner if her symptoms come back or worsen.  Vitamin B12 deficiency Will obtain B12 level today to assess need for supplementation.     Cleophas Dunker, [place]

## 2020-02-16 ENCOUNTER — Other Ambulatory Visit: Payer: Self-pay

## 2020-02-16 ENCOUNTER — Ambulatory Visit (INDEPENDENT_AMBULATORY_CARE_PROVIDER_SITE_OTHER): Payer: Medicare Other | Admitting: Family Medicine

## 2020-02-16 VITALS — BP 130/72 | HR 76 | Wt 160.6 lb

## 2020-02-16 DIAGNOSIS — E538 Deficiency of other specified B group vitamins: Secondary | ICD-10-CM

## 2020-02-16 DIAGNOSIS — I10 Essential (primary) hypertension: Secondary | ICD-10-CM

## 2020-02-16 MED ORDER — VALSARTAN 40 MG PO TABS: 20.0000 mg | ORAL_TABLET | Freq: Every day | ORAL | 3 refills | Status: DC

## 2020-02-16 NOTE — Assessment & Plan Note (Signed)
Dizziness resolved.  Systolic blood pressure 694 at last visit.  Today she is at 130/72 with discontinuing valsartan.  She was supposed to discontinue hydrochlorothiazide in July 2021 but has not.  Given lack of benefit from this medication, will go ahead and discontinue it now.  We will also restart patient at half dose valsartan 20 mg.  We will have her follow-up in 2 to 4 weeks and obtain a BMP today.  If she is able to tolerate an increase in her valsartan to 40 mg, would do this.  Given that her cardiologist prescribes atenolol, will not make changes to this, but would recommend consideration of change to metoprolol.  She is to return sooner if her symptoms come back or worsen.

## 2020-02-16 NOTE — Patient Instructions (Signed)
Thank you for coming to see me today. It was a pleasure. Today we talked about:   We will get some labs today.  If they are abnormal or we need to do something about them, I will call you.  If they are normal, I will send you a message on MyChart (if it is active) or a letter in the mail.  If you don't hear from [place] in 2 weeks, please call the office at the number below.  STOP taking hydrochlorothiazide.  Start taking only half a pill of valsartan at night.  Please follow-up with me or another provider in 2 weeks.  If you have any questions or concerns, please do not hesitate to call the office at [phone].  Best,   [place] Constable, DO

## 2020-02-16 NOTE — Assessment & Plan Note (Signed)
Will obtain B12 level today to assess need for supplementation.

## 2020-02-17 LAB — BASIC METABOLIC PANEL
BUN/Creatinine Ratio: 16 (ref 12–28)
BUN: 12 mg/dL (ref 8–27)
CO2: 26 mmol/L (ref 20–29)
Calcium: 9.6 mg/dL (ref 8.7–10.3)
Chloride: 98 mmol/L (ref 96–106)
Creatinine, Ser: 0.77 mg/dL (ref 0.57–1.00)
GFR calc Af Amer: 92 mL/min/{1.73_m2} (ref >59)
GFR calc non Af Amer: 80 mL/min/{1.73_m2} (ref >59)
Glucose: 173 mg/dL — ABNORMAL HIGH (ref 65–99)
Potassium: 3.1 mmol/L — ABNORMAL LOW (ref 3.5–5.2)
Sodium: 141 mmol/L (ref 134–144)

## 2020-02-17 LAB — VITAMIN B12: Vitamin B-12: 659 pg/mL (ref 232–1245)

## 2020-02-19 ENCOUNTER — Encounter: Payer: Self-pay | Admitting: Family Medicine

## 2020-02-26 ENCOUNTER — Ambulatory Visit (INDEPENDENT_AMBULATORY_CARE_PROVIDER_SITE_OTHER): Payer: Medicare Other | Admitting: Family Medicine

## 2020-02-26 ENCOUNTER — Encounter: Payer: Self-pay | Admitting: Family Medicine

## 2020-02-26 ENCOUNTER — Other Ambulatory Visit: Payer: Self-pay

## 2020-02-26 VITALS — BP 130/62 | HR 76 | Ht 65.0 in | Wt 164.0 lb

## 2020-02-26 DIAGNOSIS — I10 Essential (primary) hypertension: Secondary | ICD-10-CM

## 2020-02-26 DIAGNOSIS — E876 Hypokalemia: Secondary | ICD-10-CM

## 2020-02-26 NOTE — Assessment & Plan Note (Signed)
Potassium 3.1 on last check.  With discontinuation of hydrochlorothiazide and reinitiation of losartan at that time, suspect that this will likely increase.  BMP obtained today.

## 2020-02-26 NOTE — Progress Notes (Signed)
° ° °  SUBJECTIVE:   CHIEF COMPLAINT / HPI:   HTN Current regimen: Valsartan 20 mg, atenolol 25 mg daily HCTZ was stopped at last visit and her valsartan was restarted at half of her normal dose, previously on 40 mg daily Had been having some dizziness and was the reason for stopping her medications on her own Has restarted these medications and no more dizziness Bps at home 120/70, 116/60s, 121/75  Hypokalemia On last check, K3.1 HCTZ and stopped and losartan had been restarted which would both work to increase her potassium  PERTINENT  PMH / PSH: Aortic atherosclerosis, hypertension, SVT, allergic rhinitis, asthma, GERD, T2DM, PTSD, tobacco abuse, atrial flutter s/p ablation  OBJECTIVE:   BP 130/62    Pulse 76    Ht 5\' 5"  (1.651 m)    Wt 164 lb (74.4 kg)    SpO2 97%    BMI 27.29 kg/m    Physical Exam:  General: 70 y.o. female in NAD Cardio: RRR no m/r/g Lungs: CTAB, no wheezing, no rhonchi, no crackles, no IWOB on RA Skin: warm and dry Extremities: No edema   ASSESSMENT/PLAN:   Essential hypertension Blood pressure goal today.  Her diastolic blood pressure seems to be on the lower end and could contribute to dizzy symptoms that she was previously experiencing.  Would not to have tight blood pressure control with her and would let her run on the slightly higher systolic and to allow for this.  We will continue with valsartan 20 mg and atenolol per her cardiologist.  Again, would recommend change in beta-blocker therapy but will defer to cardiology.  Will obtain BMP today.  Follow-up in 2 months.  Hypokalemia Potassium 3.1 on last check.  With discontinuation of hydrochlorothiazide and reinitiation of losartan at that time, suspect that this will likely increase.  BMP obtained today.     Cleophas Dunker, [place]

## 2020-02-26 NOTE — Patient Instructions (Addendum)
Thank you for coming to see me today. It was a pleasure. Today we talked about:   We will get some labs today.  If they are abnormal or we need to do something about them, I will call you.  If they are normal, I will send you a message on MyChart (if it is active) or a letter in the mail.  If you don't hear from [place] in 2 weeks, please call the office at the number below.  We will continue with Valsartan 20mg  and Atenolol 25mg .  If your blood pressure is not good, please give [place] a call to schedule an appointment sooner.  Please follow-up with PCP in 2 months.  If you have any questions or concerns, please do not hesitate to call the office at [phone].  Best,   [place] Constable, DO

## 2020-02-26 NOTE — Assessment & Plan Note (Signed)
Blood pressure goal today.  Her diastolic blood pressure seems to be on the lower end and could contribute to dizzy symptoms that she was previously experiencing.  Would not to have tight blood pressure control with her and would let her run on the slightly higher systolic and to allow for this.  We will continue with valsartan 20 mg and atenolol per her cardiologist.  Again, would recommend change in beta-blocker therapy but will defer to cardiology.  Will obtain BMP today.  Follow-up in 2 months.

## 2020-02-27 ENCOUNTER — Other Ambulatory Visit: Payer: Self-pay | Admitting: Family Medicine

## 2020-02-27 ENCOUNTER — Telehealth: Payer: Self-pay | Admitting: Family Medicine

## 2020-02-27 DIAGNOSIS — J3089 Other allergic rhinitis: Secondary | ICD-10-CM | POA: Diagnosis not present

## 2020-02-27 DIAGNOSIS — J301 Allergic rhinitis due to pollen: Secondary | ICD-10-CM | POA: Diagnosis not present

## 2020-02-27 DIAGNOSIS — E876 Hypokalemia: Secondary | ICD-10-CM

## 2020-02-27 LAB — BASIC METABOLIC PANEL
BUN/Creatinine Ratio: 7 — ABNORMAL LOW (ref 12–28)
BUN: 5 mg/dL — ABNORMAL LOW (ref 8–27)
CO2: 23 mmol/L (ref 20–29)
Calcium: 9.1 mg/dL (ref 8.7–10.3)
Chloride: 102 mmol/L (ref 96–106)
Creatinine, Ser: 0.7 mg/dL (ref 0.57–1.00)
GFR calc Af Amer: 104 mL/min/{1.73_m2} (ref >59)
GFR calc non Af Amer: 90 mL/min/{1.73_m2} (ref >59)
Glucose: 260 mg/dL — ABNORMAL HIGH (ref 65–99)
Potassium: 3 mmol/L — ABNORMAL LOW (ref 3.5–5.2)
Sodium: 142 mmol/L (ref 134–144)

## 2020-02-27 MED ORDER — POTASSIUM CHLORIDE ER 20 MEQ PO TBCR: 20.0000 meq | EXTENDED_RELEASE_TABLET | Freq: Two times a day (BID) | ORAL | 0 refills | Status: DC

## 2020-02-27 NOTE — Telephone Encounter (Signed)
Called, no answer, left VM to call back.  If calls back, her potassium was still a little low, but everything else on her blood work looks good.  I have sent potassium to the pharmacy.  She should take one tablet twice a day for the next two days.  Then the following day (Friday) should come in for blood work only.  She doesn't need an appointment and doesn't need to see a doctor.

## 2020-02-27 NOTE — Telephone Encounter (Signed)
Patient calls back to nurse line. I informed her of potassium and needs for medication. Instructions given and informed her to call Friday when she is able to come for a lab visit.

## 2020-03-01 ENCOUNTER — Other Ambulatory Visit: Payer: Medicare Other

## 2020-03-01 ENCOUNTER — Other Ambulatory Visit: Payer: Self-pay

## 2020-03-01 DIAGNOSIS — E876 Hypokalemia: Secondary | ICD-10-CM

## 2020-03-01 DIAGNOSIS — J301 Allergic rhinitis due to pollen: Secondary | ICD-10-CM | POA: Diagnosis not present

## 2020-03-01 DIAGNOSIS — J3089 Other allergic rhinitis: Secondary | ICD-10-CM | POA: Diagnosis not present

## 2020-03-02 LAB — BASIC METABOLIC PANEL
BUN/Creatinine Ratio: 10 — ABNORMAL LOW (ref 12–28)
BUN: 6 mg/dL — ABNORMAL LOW (ref 8–27)
CO2: 21 mmol/L (ref 20–29)
Calcium: 9.1 mg/dL (ref 8.7–10.3)
Chloride: 103 mmol/L (ref 96–106)
Creatinine, Ser: 0.59 mg/dL (ref 0.57–1.00)
GFR calc Af Amer: 110 mL/min/{1.73_m2} (ref >59)
GFR calc non Af Amer: 95 mL/min/{1.73_m2} (ref >59)
Glucose: 225 mg/dL — ABNORMAL HIGH (ref 65–99)
Potassium: 3.5 mmol/L (ref 3.5–5.2)
Sodium: 142 mmol/L (ref 134–144)

## 2020-03-04 DIAGNOSIS — J301 Allergic rhinitis due to pollen: Secondary | ICD-10-CM | POA: Diagnosis not present

## 2020-03-04 DIAGNOSIS — J3089 Other allergic rhinitis: Secondary | ICD-10-CM | POA: Diagnosis not present

## 2020-03-08 ENCOUNTER — Ambulatory Visit: Payer: Medicare Other | Admitting: Podiatry

## 2020-03-08 ENCOUNTER — Other Ambulatory Visit: Payer: Self-pay | Admitting: Family Medicine

## 2020-03-08 DIAGNOSIS — G8929 Other chronic pain: Secondary | ICD-10-CM

## 2020-03-08 DIAGNOSIS — I10 Essential (primary) hypertension: Secondary | ICD-10-CM

## 2020-03-09 ENCOUNTER — Other Ambulatory Visit: Payer: Self-pay | Admitting: Family Medicine

## 2020-03-09 DIAGNOSIS — I10 Essential (primary) hypertension: Secondary | ICD-10-CM

## 2020-03-15 ENCOUNTER — Other Ambulatory Visit: Payer: Self-pay

## 2020-03-15 ENCOUNTER — Encounter: Payer: Self-pay | Admitting: Podiatry

## 2020-03-15 ENCOUNTER — Ambulatory Visit: Payer: Medicare Other | Admitting: Podiatry

## 2020-03-15 DIAGNOSIS — M79675 Pain in left toe(s): Secondary | ICD-10-CM | POA: Diagnosis not present

## 2020-03-15 DIAGNOSIS — M79674 Pain in right toe(s): Secondary | ICD-10-CM

## 2020-03-15 DIAGNOSIS — B351 Tinea unguium: Secondary | ICD-10-CM | POA: Diagnosis not present

## 2020-03-15 DIAGNOSIS — M2010 Hallux valgus (acquired), unspecified foot: Secondary | ICD-10-CM | POA: Diagnosis not present

## 2020-03-15 DIAGNOSIS — J301 Allergic rhinitis due to pollen: Secondary | ICD-10-CM | POA: Diagnosis not present

## 2020-03-15 DIAGNOSIS — E1140 Type 2 diabetes mellitus with diabetic neuropathy, unspecified: Secondary | ICD-10-CM | POA: Diagnosis not present

## 2020-03-15 DIAGNOSIS — J4520 Mild intermittent asthma, uncomplicated: Secondary | ICD-10-CM | POA: Insufficient documentation

## 2020-03-15 DIAGNOSIS — J3089 Other allergic rhinitis: Secondary | ICD-10-CM | POA: Diagnosis not present

## 2020-03-15 DIAGNOSIS — E119 Type 2 diabetes mellitus without complications: Secondary | ICD-10-CM
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
## 2020-03-18 ENCOUNTER — Ambulatory Visit (HOSPITAL_COMMUNITY)
Admission: RE | Admit: 2020-03-18 | Discharge: 2020-03-18 | Disposition: A | Discharge status: Home / Self Care | Payer: Medicare Other | Source: Ambulatory Visit | Attending: Family Medicine | Admitting: Family Medicine

## 2020-03-18 ENCOUNTER — Other Ambulatory Visit: Payer: Self-pay

## 2020-03-18 ENCOUNTER — Ambulatory Visit (INDEPENDENT_AMBULATORY_CARE_PROVIDER_SITE_OTHER): Payer: Medicare Other | Admitting: Family Medicine

## 2020-03-18 DIAGNOSIS — M25511 Pain in right shoulder: Secondary | ICD-10-CM | POA: Insufficient documentation

## 2020-03-18 DIAGNOSIS — M25521 Pain in right elbow: Secondary | ICD-10-CM | POA: Diagnosis not present

## 2020-03-18 DIAGNOSIS — M25519 Pain in unspecified shoulder: Secondary | ICD-10-CM | POA: Diagnosis not present

## 2020-03-18 DIAGNOSIS — M7989 Other specified soft tissue disorders: Secondary | ICD-10-CM | POA: Diagnosis not present
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
## 2020-03-21 DIAGNOSIS — J301 Allergic rhinitis due to pollen: Secondary | ICD-10-CM | POA: Diagnosis not present

## 2020-03-21 DIAGNOSIS — J3089 Other allergic rhinitis: Secondary | ICD-10-CM | POA: Diagnosis not present

## 2020-03-28 DIAGNOSIS — J3089 Other allergic rhinitis: Secondary | ICD-10-CM | POA: Diagnosis not present

## 2020-03-28 DIAGNOSIS — J301 Allergic rhinitis due to pollen: Secondary | ICD-10-CM | POA: Diagnosis not present

## 2020-04-01 ENCOUNTER — Encounter: Payer: Self-pay | Admitting: Family Medicine

## 2020-04-01 ENCOUNTER — Ambulatory Visit (INDEPENDENT_AMBULATORY_CARE_PROVIDER_SITE_OTHER): Payer: Medicare Other | Admitting: Family Medicine

## 2020-04-01 ENCOUNTER — Other Ambulatory Visit: Payer: Self-pay

## 2020-04-01 DIAGNOSIS — M79641 Pain in right hand: Secondary | ICD-10-CM | POA: Diagnosis not present
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
## 2020-04-02 DIAGNOSIS — J301 Allergic rhinitis due to pollen: Secondary | ICD-10-CM | POA: Diagnosis not present

## 2020-04-02 DIAGNOSIS — J3089 Other allergic rhinitis: Secondary | ICD-10-CM | POA: Diagnosis not present

## 2020-04-09 DIAGNOSIS — J3089 Other allergic rhinitis: Secondary | ICD-10-CM | POA: Diagnosis not present

## 2020-04-09 DIAGNOSIS — J301 Allergic rhinitis due to pollen: Secondary | ICD-10-CM | POA: Diagnosis not present

## 2020-04-16 DIAGNOSIS — J3089 Other allergic rhinitis: Secondary | ICD-10-CM | POA: Diagnosis not present

## 2020-04-16 DIAGNOSIS — J301 Allergic rhinitis due to pollen: Secondary | ICD-10-CM | POA: Diagnosis not present

## 2020-04-24 ENCOUNTER — Other Ambulatory Visit: Payer: Self-pay | Admitting: Family Medicine

## 2020-04-24 DIAGNOSIS — E0840 Diabetes mellitus due to underlying condition with diabetic neuropathy, unspecified: Secondary | ICD-10-CM

## 2020-04-24 DIAGNOSIS — J301 Allergic rhinitis due to pollen: Secondary | ICD-10-CM | POA: Diagnosis not present

## 2020-04-24 DIAGNOSIS — J3089 Other allergic rhinitis: Secondary | ICD-10-CM | POA: Diagnosis not present

## 2020-05-01 DIAGNOSIS — J301 Allergic rhinitis due to pollen: Secondary | ICD-10-CM | POA: Diagnosis not present

## 2020-05-01 DIAGNOSIS — J3089 Other allergic rhinitis: Secondary | ICD-10-CM | POA: Diagnosis not present

## 2020-05-08 DIAGNOSIS — J3089 Other allergic rhinitis: Secondary | ICD-10-CM | POA: Diagnosis not present

## 2020-05-08 DIAGNOSIS — J301 Allergic rhinitis due to pollen: Secondary | ICD-10-CM | POA: Diagnosis not present

## 2020-05-08 NOTE — Progress Notes (Signed)
   SUBJECTIVE:   CHIEF COMPLAINT / HPI:   Chief Complaint  Patient presents with  . Diabetes     [name] is a 70 y.o. female here for diabetes follow up.   Diabetes Mellitus  Eats 1-2 meals a day. Admits she does not always eat as healthy as she would like. She has a class reunion coming up that is motivating her to eat better. Denies missing any doses of DM medications and takes Glipizide, metformin. Denies increased thirst, hunger, or frequent urination. States her blood surgar was 150 this morning.     HTN Takes Valsartan and atenolol. Previously stopped taking HCTZ. Denies missing doses of antihypertensive medications. Denies chest pain, palpitations, lower extremity edema, exertional dyspnea, lightheadedness, headaches and vision changes.  Home BP Monitoring: Yes  Exercises:  not active  Chronic back pain Requests refill of her Tramadol. Also takes Takes Tylenol and Gabapentin daily. Pain is well controlled with this regimen.        PERTINENT  PMH / PSH: reviewed and updated as appropriate   OBJECTIVE:   BP 122/64   Pulse 74   Ht 5\' 5"  (1.651 m)   Wt 158 lb 6.4 oz (71.8 kg)   SpO2 96%   BMI 26.36 kg/m    GEN: pleasant older female, in no acute distress CV: regular rate and rhythm, no JVP RESP: no increased work of breathing, clear to ascultation bilaterally  MSK: ROM at baseline, no ambulatory assistance devices present SKIN: warm, dry    ASSESSMENT/PLAN:   Essential hypertension Stable.  BP at goal.  Medications: Valsartan 20 mg and Atenolol. Patient unsure if she is taking HCTZ. On chart review, this medication was discontinued due to hypotension. Follows with cardiology. Exercise: Encouraged to increase physical activity as tolerated.  Diet Pattern: Heart healthy dietary choices discussed.  -BMP, LDL, a1c today            Controlled diabetes mellitus with neurologic complication, without long-term current use of insulin  (HCC) Continue current regimen: Glipizide 10 mg, metformin1000 mg BID.  A1c today 7.9 and previously 7.9 in Jan 2022. BMP from Feb 2022 reviewed. Serum creatine stable. Encouraged continued diet rich in vegetables and complex carbs.  Heart healthy carb modified diet. Counseled on need to continue exercising.   Statin therapy: Lipitor ACEi/ARB:  Valsartan  Foot exam: UTD due 03/2021 Eye exam: UTD next due in Nov 2022  Chronic low back pain PDMP reviewed during this encounter. Pt has not had refill in one month. Refill sent to pharmacy. Continue Gabapentin as needed and daily Tylenol.         Lyndee Hensen, DO PGY-2, [facility] 05/09/2020

## 2020-05-09 ENCOUNTER — Other Ambulatory Visit: Payer: Self-pay

## 2020-05-09 ENCOUNTER — Encounter: Payer: Self-pay | Admitting: Family Medicine

## 2020-05-09 ENCOUNTER — Ambulatory Visit (INDEPENDENT_AMBULATORY_CARE_PROVIDER_SITE_OTHER): Payer: Medicare Other | Admitting: Family Medicine

## 2020-05-09 VITALS — BP 122/64 | HR 74 | Ht 65.0 in | Wt 158.4 lb

## 2020-05-09 DIAGNOSIS — E0840 Diabetes mellitus due to underlying condition with diabetic neuropathy, unspecified: Secondary | ICD-10-CM

## 2020-05-09 DIAGNOSIS — Z7689 Persons encountering health services in other specified circumstances: Secondary | ICD-10-CM | POA: Diagnosis not present

## 2020-05-09 DIAGNOSIS — M5440 Lumbago with sciatica, unspecified side: Secondary | ICD-10-CM

## 2020-05-09 DIAGNOSIS — G8929 Other chronic pain: Secondary | ICD-10-CM

## 2020-05-09 DIAGNOSIS — I10 Essential (primary) hypertension: Secondary | ICD-10-CM

## 2020-05-09 LAB — POCT GLYCOSYLATED HEMOGLOBIN (HGB A1C): HbA1c, POC (controlled diabetic range): 7.9 % — AB (ref 0.0–7.0)

## 2020-05-09 MED ORDER — TRAMADOL HCL 50 MG PO TABS: ORAL_TABLET | ORAL | 0 refills | Status: DC

## 2020-05-09 NOTE — Patient Instructions (Signed)
It was great seeing you today!  Please check-out at the front desk before leaving the clinic. I'd like to see you back in 3 months but if you need to be seen earlier than that for any new issues we're happy to fit you in, just give [place] a call!  Visit Remembers: - Stop by the pharmacy to pick up your prescriptions  - Continue to work on your healthy eating habits and incorporating exercise into your daily life. (see below) - Your goal is to have an A1c < 8 - Medicine Changes: Stop the hydrochlorothiazide.      Diet Recommendations for Diabetes  Carbohydrate includes starch, sugar, and fiber.  Of these, only sugar and starch raise blood glucose.  (Fiber is found in fruits, vegetables [especially skin, seeds, and stalks] and whole grains.)   Starchy (carb) foods: Bread, rice, pasta, potatoes, corn, cereal, grits, crackers, bagels, muffins, all baked goods.  (Fruit, milk, and yogurt also have carbohydrate, but most of these foods will not spike your blood sugar as most starchy foods will.)  A few fruits do cause high blood sugars; use small portions of bananas (limit to 1/2 at a time), grapes, watermelon, oranges, and most tropical fruits.   Protein foods: Meat, fish, poultry, eggs, dairy foods, and beans such as pinto and kidney beans (beans also provide carbohydrate).   1. Eat at least REAL 3 meals and 1-2 snacks per day. Never go more than 4-5 hours while awake without eating. Eat breakfast within the first hour of getting up.   2. Limit starchy foods to TWO per meal and ONE per snack. ONE portion of a starchy food is equal to the following:   - ONE slice of bread (or its equivalent, such as half of a hamburger bun).   - 1/2 cup of a "scoopable" starchy food such as potatoes or rice.   - 15 grams of Total Carbohydrate as shown on food label.   - Every 4 ounces of a sweet drink (including fruit juice). 3. Include at every meal: a protein food, a carb food, and vegetables and/or fruit.   -  Obtain twice the volume of veg's as protein or carbohydrate foods for both lunch and dinner.   - Fresh or frozen veg's are best.   - Keep frozen veg's on hand for a quick vegetable serving.       Regarding lab work today:  Due to recent changes in healthcare laws, you may see the results of your imaging and laboratory studies on MyChart before your doctor has had a chance to review them.  We understand that in some cases there may be results that are confusing or concerning to you. Not all laboratory results come back in the same time frame and your doctor may be waiting for multiple results in order to interpret others.  Please give [place] 72 hours in order for your doctor to thoroughly review all the results before contacting the office for clarification of your results. If everything is normal, you will get a letter in the mail or a message in My Chart. Please give [place] a call if you do not hear from [place] after 2 weeks.  Please bring all of your medications with you to each visit.    If you haven't already, sign up for My Chart to have easy access to your labs results, and communication with your primary care physician.  Feel free to call with any questions or concerns at any time, at  [phone].   Take care,  Dr. Rushie [facility]

## 2020-05-10 ENCOUNTER — Other Ambulatory Visit: Payer: Self-pay | Admitting: Family Medicine

## 2020-05-10 DIAGNOSIS — E876 Hypokalemia: Secondary | ICD-10-CM

## 2020-05-10 LAB — BASIC METABOLIC PANEL
BUN/Creatinine Ratio: 10 — ABNORMAL LOW (ref 12–28)
BUN: 7 mg/dL — ABNORMAL LOW (ref 8–27)
CO2: 23 mmol/L (ref 20–29)
Calcium: 9.5 mg/dL (ref 8.7–10.3)
Chloride: 98 mmol/L (ref 96–106)
Creatinine, Ser: 0.71 mg/dL (ref 0.57–1.00)
Glucose: 242 mg/dL — ABNORMAL HIGH (ref 65–99)
Potassium: 2.9 mmol/L — ABNORMAL LOW (ref 3.5–5.2)
Sodium: 142 mmol/L (ref 134–144)
eGFR: 93 mL/min/{1.73_m2} (ref >59)

## 2020-05-10 LAB — LIPID PANEL
Chol/HDL Ratio: 2.7 ratio (ref 0.0–4.4)
Cholesterol, Total: 106 mg/dL (ref 100–199)
HDL: 40 mg/dL (ref >39)
LDL Chol Calc (NIH): 48 mg/dL (ref 0–99)
Triglycerides: 92 mg/dL (ref 0–149)
VLDL Cholesterol Cal: 18 mg/dL (ref 5–40)

## 2020-05-10 MED ORDER — POTASSIUM CHLORIDE CRYS ER 20 MEQ PO TBCR: 40.0000 meq | EXTENDED_RELEASE_TABLET | Freq: Two times a day (BID) | ORAL | 0 refills | Status: DC

## 2020-05-10 NOTE — Progress Notes (Signed)
Spoke to patient about her hypokalemia.  She confirmed that she has not been taking the hydrochlorothiazide.  On chart review, this may be chronic.  We will treat with 40 mEq K-Dur twice a day for 3 doses.  Patient scheduled to repeat BMP on Monday at 1330.    Lyndee Hensen, DO PGY-2, [facility] 05/10/2020

## 2020-05-13 ENCOUNTER — Telehealth: Payer: Self-pay

## 2020-05-13 ENCOUNTER — Other Ambulatory Visit: Payer: Self-pay

## 2020-05-13 ENCOUNTER — Other Ambulatory Visit: Payer: Medicare Other

## 2020-05-13 DIAGNOSIS — E876 Hypokalemia: Secondary | ICD-10-CM | POA: Diagnosis not present

## 2020-05-13 NOTE — Assessment & Plan Note (Signed)
PDMP reviewed during this encounter. Pt has not had refill in one month. Refill sent to pharmacy. Continue Gabapentin as needed and daily Tylenol.

## 2020-05-13 NOTE — Telephone Encounter (Signed)
While pt was in the lab getting her blood drawn she asked me to let the Dr. Who saw her last know that the ED told her she has a fatty liver. Pt would like to know if she should do something about that. Is it something to worry about?  Please call pt. Ottis Stain, CMA

## 2020-05-13 NOTE — Assessment & Plan Note (Addendum)
Stable.  BP at goal.  Medications: Valsartan 20 mg and Atenolol. Patient unsure if she is taking HCTZ. On chart review, this medication was discontinued due to hypotension. Follows with cardiology. Exercise: Encouraged to increase physical activity as tolerated.  Diet Pattern: Heart healthy dietary choices discussed.  -BMP, LDL, a1c today

## 2020-05-13 NOTE — Assessment & Plan Note (Signed)
Continue current regimen: Glipizide 10 mg, metformin1000 mg BID.  A1c today 7.9 and previously 7.9 in Jan 2022. BMP from Feb 2022 reviewed. Serum creatine stable. Encouraged continued diet rich in vegetables and complex carbs.  Heart healthy carb modified diet. Counseled on need to continue exercising.   Statin therapy: Lipitor ACEi/ARB:  Valsartan  Foot exam: UTD due 03/2021 Eye exam: UTD next due in Nov 2022

## 2020-05-14 ENCOUNTER — Telehealth: Payer: Self-pay | Admitting: Family Medicine

## 2020-05-14 ENCOUNTER — Other Ambulatory Visit: Payer: Self-pay | Admitting: *Deleted

## 2020-05-14 DIAGNOSIS — E876 Hypokalemia: Secondary | ICD-10-CM

## 2020-05-14 DIAGNOSIS — F411 Generalized anxiety disorder: Secondary | ICD-10-CM

## 2020-05-14 DIAGNOSIS — J301 Allergic rhinitis due to pollen: Secondary | ICD-10-CM | POA: Diagnosis not present

## 2020-05-14 DIAGNOSIS — J3089 Other allergic rhinitis: Secondary | ICD-10-CM | POA: Diagnosis not present

## 2020-05-14 LAB — BASIC METABOLIC PANEL
BUN/Creatinine Ratio: 6 — ABNORMAL LOW (ref 12–28)
BUN: 5 mg/dL — ABNORMAL LOW (ref 8–27)
CO2: 23 mmol/L (ref 20–29)
Calcium: 9.4 mg/dL (ref 8.7–10.3)
Chloride: 100 mmol/L (ref 96–106)
Creatinine, Ser: 0.78 mg/dL (ref 0.57–1.00)
Glucose: 323 mg/dL — ABNORMAL HIGH (ref 65–99)
Potassium: 3.3 mmol/L — ABNORMAL LOW (ref 3.5–5.2)
Sodium: 140 mmol/L (ref 134–144)
eGFR: 83 mL/min/{1.73_m2} (ref >59)

## 2020-05-14 MED ORDER — POTASSIUM CHLORIDE CRYS ER 20 MEQ PO TBCR: 20.0000 meq | EXTENDED_RELEASE_TABLET | Freq: Every day | ORAL | 0 refills | Status: DC

## 2020-05-14 MED ORDER — CITALOPRAM HYDROBROMIDE 20 MG PO TABS: 20.0000 mg | ORAL_TABLET | Freq: Every day | ORAL | 3 refills | Status: DC

## 2020-05-14 NOTE — Addendum Note (Signed)
Addended by: Caralee Ates on: 05/14/2020 01:45 PM   Modules accepted: Orders

## 2020-05-14 NOTE — Telephone Encounter (Signed)
Discussed with patient continued potassium therapy until appt for repeat labs next week. She voiced understanding and states she feels like she is in her usual state of health.   [place], DO 05/14/2020, 1:43 PM PGY-2, [place]

## 2020-05-14 NOTE — Progress Notes (Signed)
Patient aware of results. See telephone note. K supp to pharm. F/u next week with repeat labs.

## 2020-05-14 NOTE — Telephone Encounter (Signed)
error 

## 2020-05-14 NOTE — Addendum Note (Signed)
Addended by: Caralee Ates on: 05/14/2020 01:04 PM   Modules accepted: Orders

## 2020-05-15 ENCOUNTER — Telehealth: Payer: Self-pay | Admitting: Internal Medicine

## 2020-05-15 NOTE — Telephone Encounter (Signed)
Left message for pt to call back  °

## 2020-05-16 NOTE — Telephone Encounter (Signed)
Spoke with pt and let her know she can try gas-x or phazyme otc for gas.

## 2020-05-17 IMAGING — CR DG CHEST 2V
2 series · 2 of 2 positions shown · non-contrast
Comparison: 10/03/2015

CLINICAL DATA: Sharp substernal chest pain for several hours,
initial encounter

EXAM:
CHEST - 2 VIEW

[chest pa]
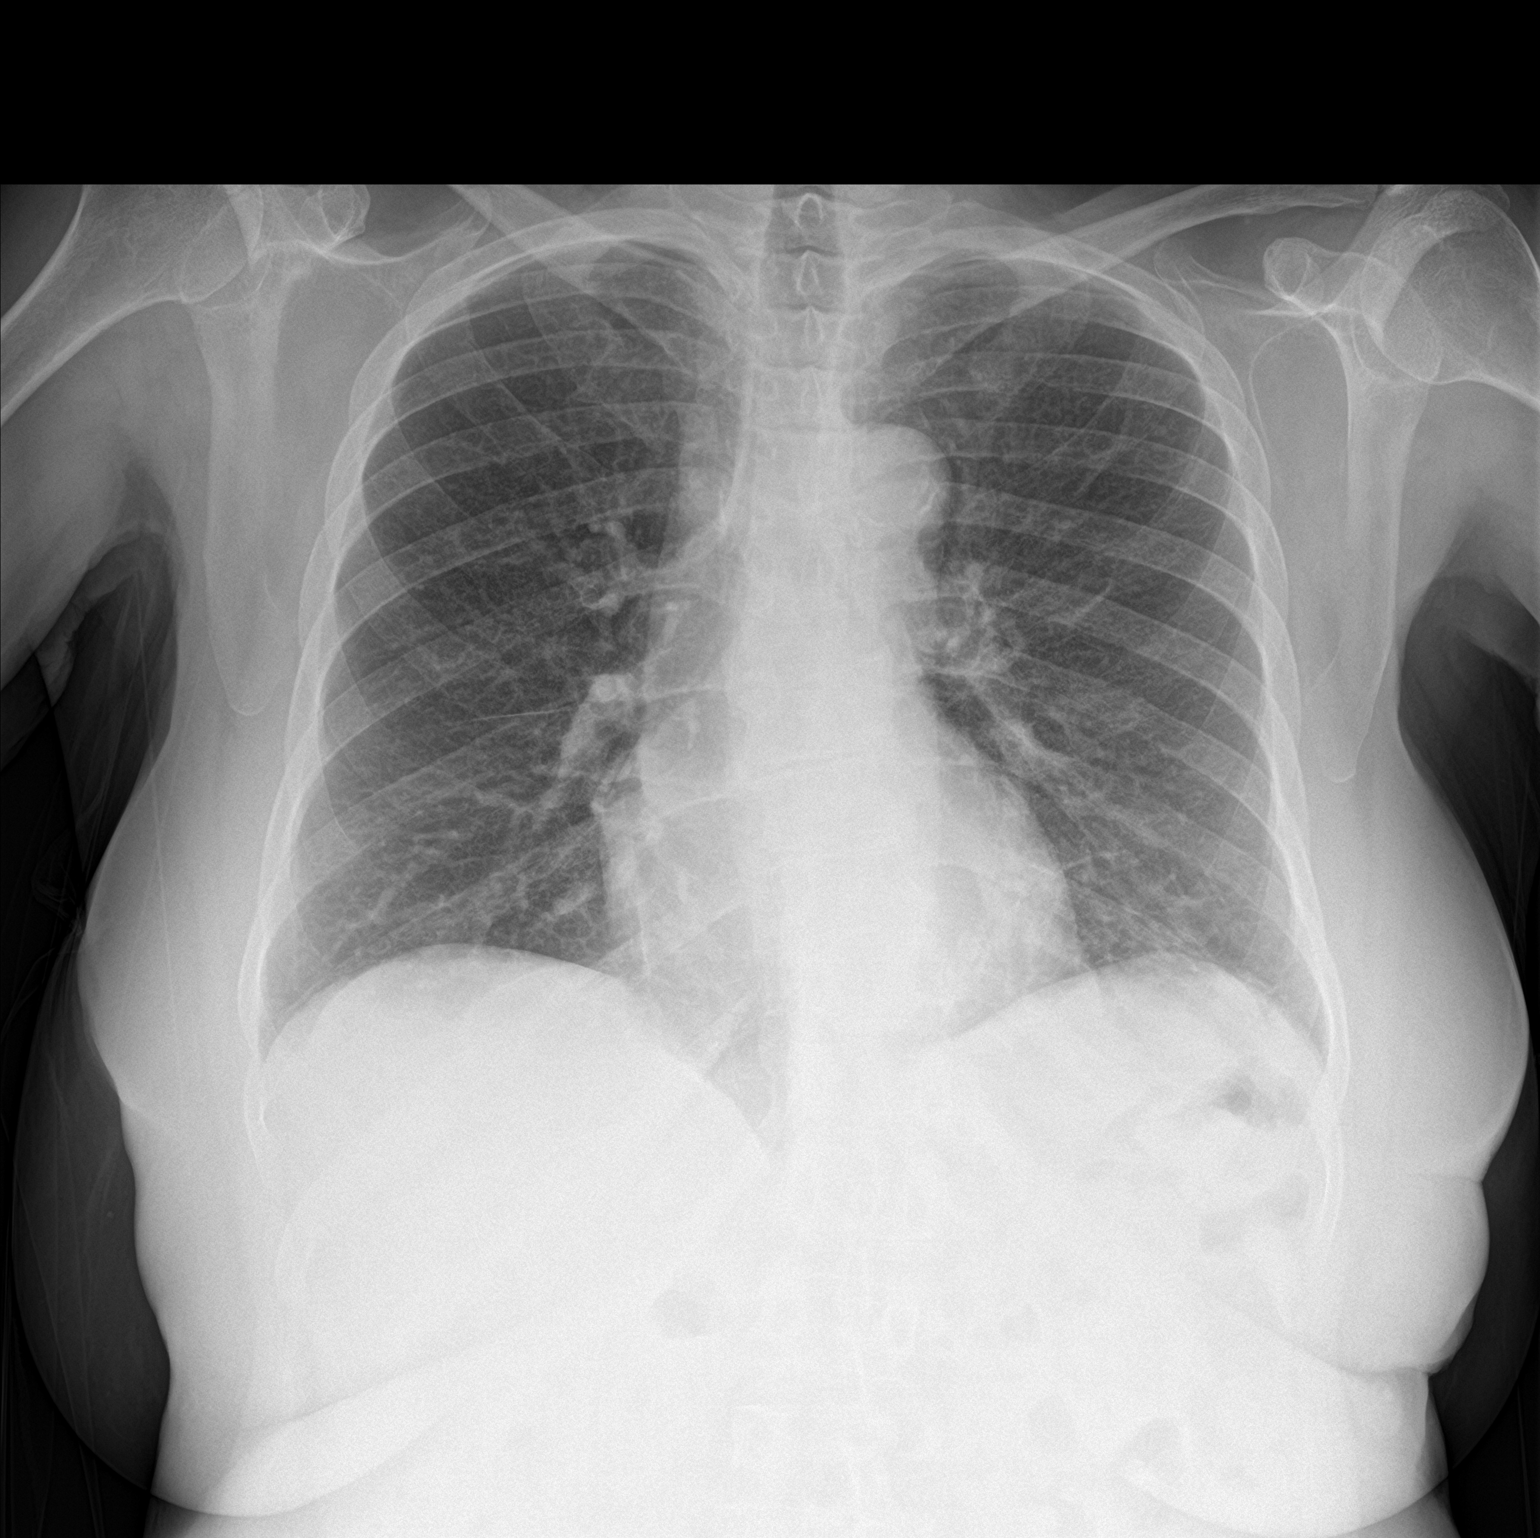

[chest lat]
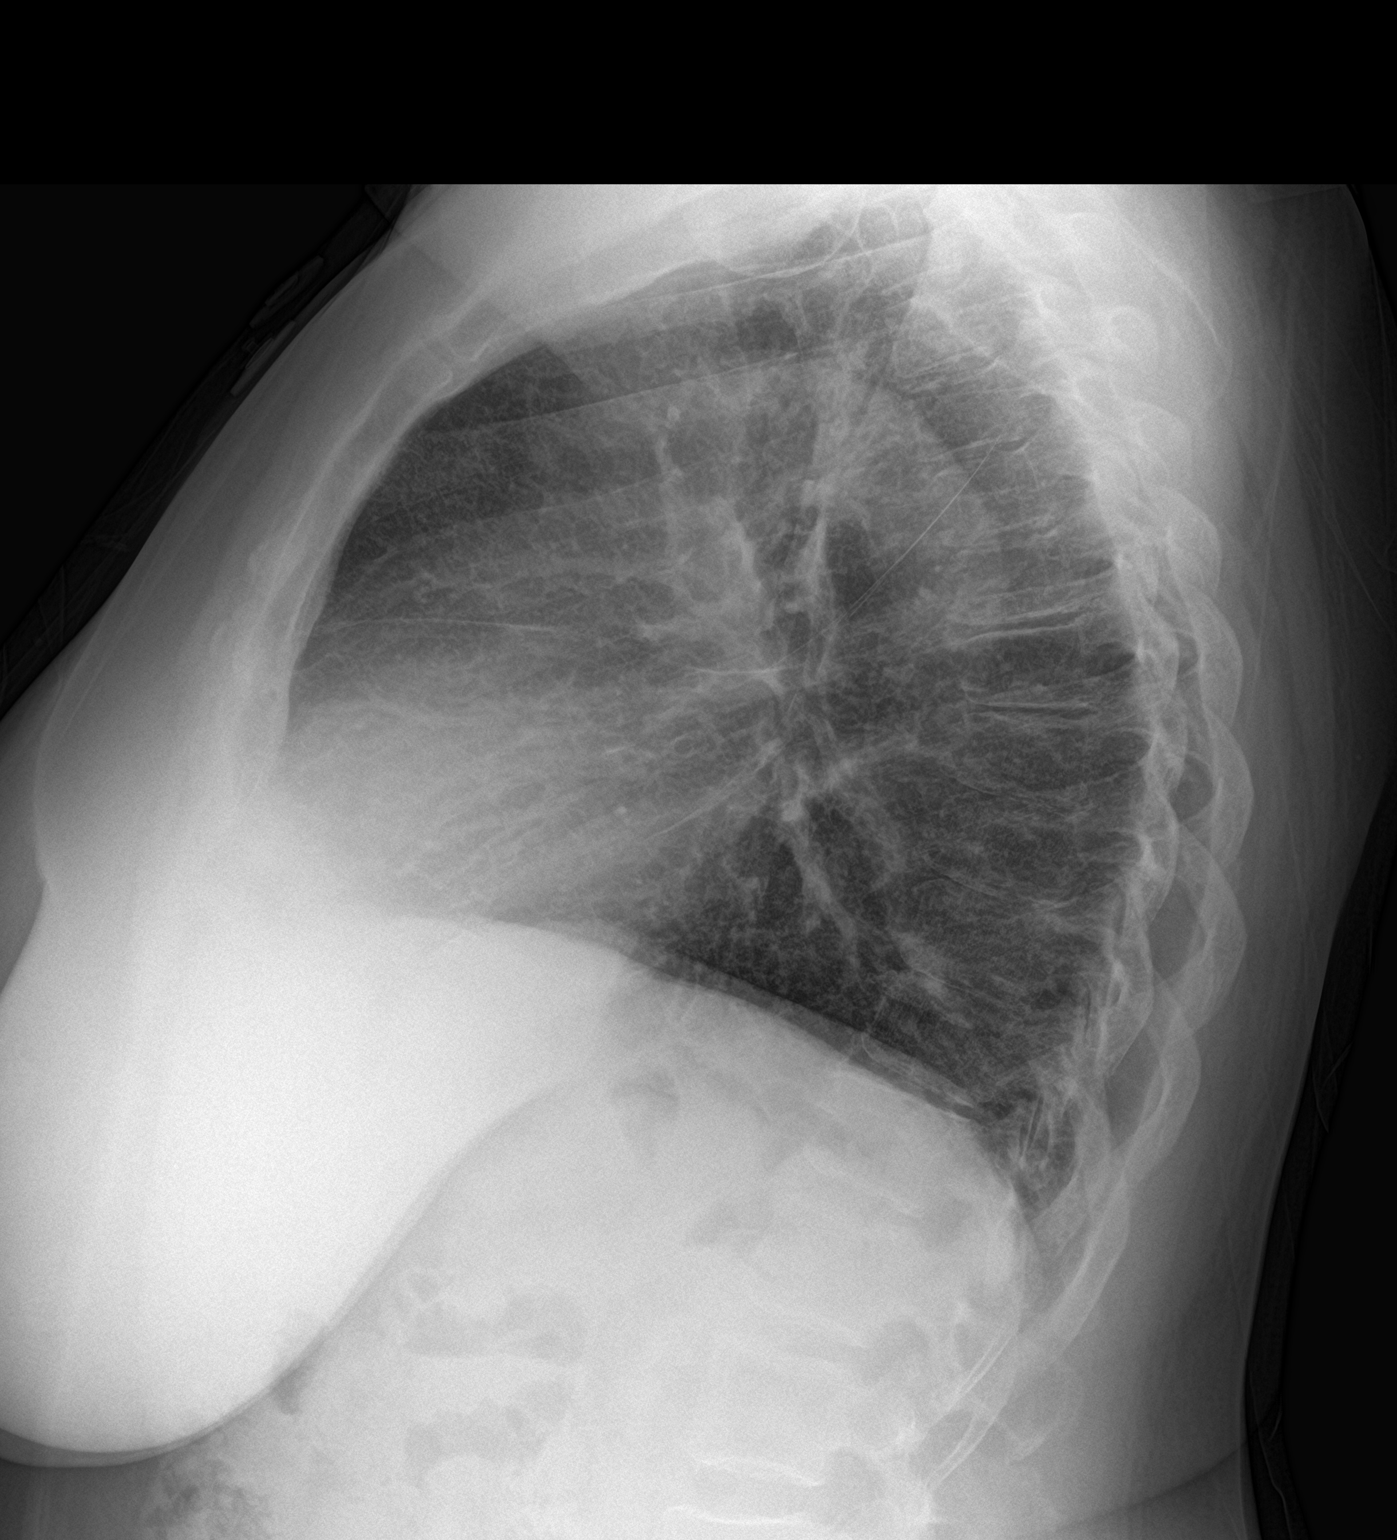

[2 of 2 positions shown; findings below may reference images not displayed]

FINDINGS: Cardiac shadow is within normal limits. Aortic calcifications are
noted. The lungs are well aerated bilaterally. No focal infiltrate
or sizable effusion is seen. No acute bony abnormality is noted.
IMPRESSION: No active cardiopulmonary disease.

## 2020-05-20 DIAGNOSIS — J301 Allergic rhinitis due to pollen: Secondary | ICD-10-CM | POA: Diagnosis not present

## 2020-05-20 DIAGNOSIS — J3089 Other allergic rhinitis: Secondary | ICD-10-CM | POA: Diagnosis not present

## 2020-05-22 ENCOUNTER — Ambulatory Visit (INDEPENDENT_AMBULATORY_CARE_PROVIDER_SITE_OTHER): Payer: Medicare Other | Admitting: Family Medicine

## 2020-05-22 ENCOUNTER — Other Ambulatory Visit: Payer: Self-pay

## 2020-05-22 ENCOUNTER — Encounter: Payer: Self-pay | Admitting: Family Medicine

## 2020-05-22 ENCOUNTER — Ambulatory Visit (INDEPENDENT_AMBULATORY_CARE_PROVIDER_SITE_OTHER): Payer: Medicare Other

## 2020-05-22 VITALS — BP 128/79 | HR 82 | Ht 65.0 in | Wt 159.1 lb

## 2020-05-22 DIAGNOSIS — E876 Hypokalemia: Secondary | ICD-10-CM

## 2020-05-22 DIAGNOSIS — K760 Fatty (change of) liver, not elsewhere classified: Secondary | ICD-10-CM | POA: Diagnosis not present

## 2020-05-22 DIAGNOSIS — Z23 Encounter for immunization: Secondary | ICD-10-CM | POA: Diagnosis not present

## 2020-05-22 NOTE — Patient Instructions (Signed)
It was great seeing you today!   Visit Remembers: - Stop by the pharmacy to pick up your prescriptions  - Continue to work on your healthy eating habits and incorporating exercise into your daily life.  - Your goal is to have an BP <135/85   Regarding lab work today:  Due to recent changes in healthcare laws, you may see the results of your imaging and laboratory studies on MyChart before your provider has had a chance to review them.  I understand that in some cases there may be results that are confusing or concerning to you. Not all laboratory results come back in the same time frame and you may be waiting for multiple results in order to interpret others.  Please give [place] 72 hours in order for your provider to thoroughly review all the results before contacting the office for clarification of your results. If everything is normal, you will get a letter in the mail or a message in My Chart. Please give [place] a call if you do not hear from [place] after 2 weeks.  Please bring all of your medications with you to each visit.    If you haven't already, sign up for My Chart to have easy access to your labs results, and communication with your primary care physician.  Feel free to call with any questions or concerns at any time, at [phone].   Take care,  Dr. Rushie [facility]

## 2020-05-22 NOTE — Telephone Encounter (Signed)
Patient had visit with Dr. Susa Simmonds 05/22/2020. Ottis Stain, CMA

## 2020-05-22 NOTE — Progress Notes (Addendum)
   SUBJECTIVE:   CHIEF COMPLAINT / HPI:    [name] is a 70 y.o. female here for hypokalemia follow up. Patient completed oral potassium repletion Sunday. No changes in urination, dysuria.   Reports hx of fatty liver that was previously seen on ED imaging a "while ago." Denies nausea, vomiting, diarrhea and abdominal discomfort.    PERTINENT  PMH / PSH: reviewed and updated as appropriate   OBJECTIVE:   BP 128/79   Pulse 82   Ht 5\' 5"  (1.651 m)   Wt 159 lb 2 oz (72.2 kg)   SpO2 98%   BMI 26.48 kg/m    GEN: well appearing female in no acute distress  CVS: well perfused  RESP: speaking in full sentences without pause, no respiratory distress      ASSESSMENT/PLAN:   Hepatic steatosis Seen on CT in 2016.  Obtain CMP today.   Hypokalemia Repeat BMP and Mg today.  Consider workup of of primary aldosteronism given hx of hypertension in setting of hypokalemia. Pt no longer taking diuretics. She is taking an ARB and has normal kidney function. Consider low dose Aldactone if pt's BP can tolerate.      Lyndee Hensen, DO PGY-2, [facility] 05/24/2020

## 2020-05-23 ENCOUNTER — Telehealth: Payer: Self-pay | Admitting: Family Medicine

## 2020-05-23 LAB — COMPREHENSIVE METABOLIC PANEL
ALT: 17 IU/L (ref 0–32)
AST: 17 IU/L (ref 0–40)
Albumin/Globulin Ratio: 1.7 (ref 1.2–2.2)
Albumin: 4.1 g/dL (ref 3.8–4.8)
Alkaline Phosphatase: 107 IU/L (ref 44–121)
BUN/Creatinine Ratio: 8 — ABNORMAL LOW (ref 12–28)
BUN: 6 mg/dL — ABNORMAL LOW (ref 8–27)
Bilirubin Total: 0.5 mg/dL (ref 0.0–1.2)
CO2: 21 mmol/L (ref 20–29)
Calcium: 9.6 mg/dL (ref 8.7–10.3)
Chloride: 104 mmol/L (ref 96–106)
Creatinine, Ser: 0.72 mg/dL (ref 0.57–1.00)
Globulin, Total: 2.4 g/dL (ref 1.5–4.5)
Glucose: 249 mg/dL — ABNORMAL HIGH (ref 65–99)
Potassium: 3.7 mmol/L (ref 3.5–5.2)
Sodium: 143 mmol/L (ref 134–144)
Total Protein: 6.5 g/dL (ref 6.0–8.5)
eGFR: 92 mL/min/{1.73_m2} (ref >59)

## 2020-05-23 NOTE — Telephone Encounter (Signed)
Thank you for your help with this patient

## 2020-05-23 NOTE — Telephone Encounter (Signed)
Spoke with pt about her lab results. Hypokalemia has resolved. LFTs are normal.  Recommended rechecking in 3 months. Discussed incorporating a potassium rich diet cooked spinach, cooked broccoli, potatoes, sweet potatoes, mushrooms, peas, cucumbers and occasional bananas, apricots and oranges given hx of diabetes mellitus.   Lyndee Hensen, DO PGY-2, [facility] 05/23/2020 10:55 AM

## 2020-05-24 NOTE — Assessment & Plan Note (Signed)
Seen on CT in 2016.  Obtain CMP today.

## 2020-05-24 NOTE — Addendum Note (Signed)
Addended by: Lyndee Hensen D on: 05/24/2020 12:51 AM   Modules accepted: Orders

## 2020-05-24 NOTE — Assessment & Plan Note (Addendum)
Repeat BMP and Mg today.  Consider workup of of primary aldosteronism given hx of hypertension in setting of hypokalemia. Pt no longer taking diuretics. She is taking an ARB and has normal kidney function. Consider low dose Aldactone if pt's BP can tolerate.

## 2020-05-27 DIAGNOSIS — J3089 Other allergic rhinitis: Secondary | ICD-10-CM | POA: Diagnosis not present

## 2020-05-27 DIAGNOSIS — J301 Allergic rhinitis due to pollen: Secondary | ICD-10-CM | POA: Diagnosis not present

## 2020-06-03 DIAGNOSIS — J3089 Other allergic rhinitis: Secondary | ICD-10-CM | POA: Diagnosis not present

## 2020-06-03 DIAGNOSIS — J301 Allergic rhinitis due to pollen: Secondary | ICD-10-CM | POA: Diagnosis not present

## 2020-06-07 ENCOUNTER — Other Ambulatory Visit: Payer: Self-pay | Admitting: Family Medicine

## 2020-06-07 DIAGNOSIS — M5440 Lumbago with sciatica, unspecified side: Secondary | ICD-10-CM

## 2020-06-07 DIAGNOSIS — I10 Essential (primary) hypertension: Secondary | ICD-10-CM

## 2020-06-07 DIAGNOSIS — G8929 Other chronic pain: Secondary | ICD-10-CM

## 2020-06-11 DIAGNOSIS — J301 Allergic rhinitis due to pollen: Secondary | ICD-10-CM | POA: Diagnosis not present

## 2020-06-11 DIAGNOSIS — J3089 Other allergic rhinitis: Secondary | ICD-10-CM | POA: Diagnosis not present

## 2020-06-17 DIAGNOSIS — J301 Allergic rhinitis due to pollen: Secondary | ICD-10-CM | POA: Diagnosis not present

## 2020-06-17 DIAGNOSIS — J3089 Other allergic rhinitis: Secondary | ICD-10-CM | POA: Diagnosis not present

## 2020-06-18 ENCOUNTER — Other Ambulatory Visit: Payer: Self-pay

## 2020-06-18 ENCOUNTER — Other Ambulatory Visit: Payer: Medicare Other

## 2020-06-18 DIAGNOSIS — E876 Hypokalemia: Secondary | ICD-10-CM

## 2020-06-19 LAB — MAGNESIUM: Magnesium: 1.2 mg/dL — ABNORMAL LOW (ref 1.6–2.3)

## 2020-06-20 ENCOUNTER — Telehealth: Payer: Self-pay

## 2020-06-20 NOTE — Telephone Encounter (Signed)
Patient calls nurse line requesting recent lab work results. Please advise.

## 2020-06-21 ENCOUNTER — Ambulatory Visit: Payer: Medicare Other | Admitting: Podiatry

## 2020-06-21 MED ORDER — MAGNESIUM OXIDE 400 MG PO TABS: 400.0000 mg | ORAL_TABLET | Freq: Every day | ORAL | 0 refills | Status: DC

## 2020-06-24 ENCOUNTER — Other Ambulatory Visit: Payer: Self-pay | Admitting: Family Medicine

## 2020-06-25 DIAGNOSIS — J3089 Other allergic rhinitis: Secondary | ICD-10-CM | POA: Diagnosis not present

## 2020-06-25 DIAGNOSIS — J301 Allergic rhinitis due to pollen: Secondary | ICD-10-CM | POA: Diagnosis not present

## 2020-07-02 DIAGNOSIS — J3089 Other allergic rhinitis: Secondary | ICD-10-CM | POA: Diagnosis not present

## 2020-07-02 DIAGNOSIS — J301 Allergic rhinitis due to pollen: Secondary | ICD-10-CM | POA: Diagnosis not present

## 2020-07-08 DIAGNOSIS — J301 Allergic rhinitis due to pollen: Secondary | ICD-10-CM | POA: Diagnosis not present

## 2020-07-08 DIAGNOSIS — J3089 Other allergic rhinitis: Secondary | ICD-10-CM | POA: Diagnosis not present

## 2020-07-11 ENCOUNTER — Other Ambulatory Visit: Payer: Self-pay | Admitting: Family Medicine

## 2020-07-11 DIAGNOSIS — M5440 Lumbago with sciatica, unspecified side: Secondary | ICD-10-CM

## 2020-07-11 DIAGNOSIS — G8929 Other chronic pain: Secondary | ICD-10-CM

## 2020-07-17 DIAGNOSIS — J301 Allergic rhinitis due to pollen: Secondary | ICD-10-CM | POA: Diagnosis not present

## 2020-07-17 DIAGNOSIS — J3089 Other allergic rhinitis: Secondary | ICD-10-CM | POA: Diagnosis not present

## 2020-07-18 ENCOUNTER — Other Ambulatory Visit: Payer: Self-pay | Admitting: Family Medicine

## 2020-07-23 DIAGNOSIS — J301 Allergic rhinitis due to pollen: Secondary | ICD-10-CM | POA: Diagnosis not present

## 2020-07-23 DIAGNOSIS — J3089 Other allergic rhinitis: Secondary | ICD-10-CM | POA: Diagnosis not present

## 2020-07-26 ENCOUNTER — Other Ambulatory Visit: Payer: Self-pay

## 2020-07-26 ENCOUNTER — Other Ambulatory Visit: Payer: Self-pay | Admitting: Family Medicine

## 2020-07-26 DIAGNOSIS — E0840 Diabetes mellitus due to underlying condition with diabetic neuropathy, unspecified: Secondary | ICD-10-CM

## 2020-07-26 MED ORDER — GLUCOSE BLOOD VI STRP: ORAL_STRIP | 12 refills | Status: DC

## 2020-07-26 NOTE — Progress Notes (Signed)
Patient came to office asking for glucose test strips to be sent to different pharmacy. Request refilled during this encounter.   Stevana Dufner [name], DO 07/26/2020, 11:24 AM PGY-3, [place]

## 2020-07-29 DIAGNOSIS — J3089 Other allergic rhinitis: Secondary | ICD-10-CM | POA: Diagnosis not present

## 2020-07-29 DIAGNOSIS — J301 Allergic rhinitis due to pollen: Secondary | ICD-10-CM | POA: Diagnosis not present

## 2020-08-07 DIAGNOSIS — J301 Allergic rhinitis due to pollen: Secondary | ICD-10-CM | POA: Diagnosis not present

## 2020-08-07 DIAGNOSIS — J3089 Other allergic rhinitis: Secondary | ICD-10-CM | POA: Diagnosis not present

## 2020-08-09 ENCOUNTER — Other Ambulatory Visit: Payer: Self-pay

## 2020-08-09 ENCOUNTER — Encounter: Payer: Self-pay | Admitting: Family Medicine

## 2020-08-09 ENCOUNTER — Other Ambulatory Visit: Payer: Self-pay | Admitting: Family Medicine

## 2020-08-09 ENCOUNTER — Ambulatory Visit (INDEPENDENT_AMBULATORY_CARE_PROVIDER_SITE_OTHER): Payer: Medicare Other | Admitting: Family Medicine

## 2020-08-09 VITALS — BP 130/64 | HR 79 | Ht 65.0 in | Wt 159.0 lb

## 2020-08-09 DIAGNOSIS — Z23 Encounter for immunization: Secondary | ICD-10-CM

## 2020-08-09 DIAGNOSIS — R5383 Other fatigue: Secondary | ICD-10-CM | POA: Diagnosis not present

## 2020-08-09 DIAGNOSIS — N898 Other specified noninflammatory disorders of vagina: Secondary | ICD-10-CM

## 2020-08-09 DIAGNOSIS — E876 Hypokalemia: Secondary | ICD-10-CM | POA: Diagnosis not present

## 2020-08-09 DIAGNOSIS — E0840 Diabetes mellitus due to underlying condition with diabetic neuropathy, unspecified: Secondary | ICD-10-CM | POA: Diagnosis not present

## 2020-08-09 DIAGNOSIS — M5440 Lumbago with sciatica, unspecified side: Secondary | ICD-10-CM

## 2020-08-09 DIAGNOSIS — G8929 Other chronic pain: Secondary | ICD-10-CM

## 2020-08-09 LAB — POCT GLYCOSYLATED HEMOGLOBIN (HGB A1C): HbA1c, POC (controlled diabetic range): 8.3 % — AB (ref 0.0–7.0)

## 2020-08-09 MED ORDER — SHINGRIX 50 MCG/0.5ML IM SUSR: 0.5000 mL | Freq: Once | INTRAMUSCULAR | 0 refills | Status: DC

## 2020-08-09 MED ORDER — SHINGRIX 50 MCG/0.5ML IM SUSR: 0.5000 mL | Freq: Once | INTRAMUSCULAR | 0 refills | Status: AC

## 2020-08-09 NOTE — Patient Instructions (Addendum)
It was good to see you today.  Today we discussed dietary habits to improve A1c.  We will follow-up in 6 weeks with glucose log and consider medication changes.  Today we also got labs due to fatigue and abnormal gait sensation.  I will also follow-up on your potassium and magnesium.  I will notify you of results when available.  In the meantime if you have any loss of sensation or continued trouble with walking please be evaluated in the emergency department.  Dr. Janus Molder

## 2020-08-09 NOTE — Progress Notes (Signed)
    SUBJECTIVE:   CHIEF COMPLAINT / HPI:   [name] is a 70 yo F who presents for the below.   Diabetes Current Regimen: Glipizide 2 mg twice daily, metformin 1000 mg twice daily CBGs: 250 at night , 124-134 a.m., usually 150   Last A1c: 7.9 on 05/09/20 Denies polyuria, polydipsia, hypoglycemia. Last Eye Exam: 11/16/18/2021 Statin: Lipitor 40 mg daily ACE/ARB: Valsartan 20 mg daily  Fatigue/Unsteady gait Ongoing for a while and worsening over the last few months.  Patient states that she feels unbalanced while walking and has some ongoing chronic fatigue.  Has associated lightheadedness occasionally while standing.  Denies abnormal sensation, numbness or tingling, chest pain, changes in vision.  No new medication changes.  Vaginal dryness Concern for vaginal dryness at introitus.  Patient does not want hormonal vaginal cream at this time.  Wanted to discuss other alternatives.  Denies dysuria, discharge. Declines pelvic exam today.   History of hypokalemia, history of hypomagnesemia Taking hydrochlorothiazide for blood pressure.  Not chronically on potassium supplements.  Taking magnesium supplements.  Would like to stop taking them.   Need for shingles vaccine Would like shingles vaccine.  PERTINENT  PMH / PSH: as above.   OBJECTIVE:   BP 130/64   Pulse 79   Ht '5\' 5"'$  (1.651 m)   Wt 159 lb (72.1 kg)   SpO2 98%   BMI 26.46 kg/m   General: Appears well, no acute distress. Age appropriate. Cardiac: RRR, normal heart sounds, no murmurs Respiratory: CTAB, normal effort Neuro: alert and oriented, CN II-XII grossly intact. Gait appears normal.  Psych: normal affect  ASSESSMENT/PLAN:   Controlled diabetes mellitus with neurologic complication, without long-term current use of insulin (HCC) A1c 8.3 today.  Patient states she recently had a birthday and has not been doing well with her diet.  Does not want to add any medication changes she would like to improve A1c with  lifestyle changes.  We did discuss having a follow-up in 6 weeks with CBG logs and a subsequent follow-up in another 6 weeks for repeat A1c.  At that time if no improvement will consider SG LT 2 inhibitor versus GLP-1 agonist.  Fatigue Unknown etiology. And symptom complaint of unsteady gait with normal neuro exam. Likely chronic. Orthostatic VS WNL. Patient has a history of vitamin B deficiency last labs are normal.  Last TSH was found to be low we will repeat today.  We will also obtain CBC for possible anemia causes. Chart review reveals this was address 2 years prior and sleep study was consider. If not already done will consider.  Hypokalemia Continue magnesium supplement.  Consider the need for potassium supplementation depending on labs. - Basic Metabolic Panel - Magnesium  Vaginal dryness Likely due to decreased estrogen hormone.  Nonpruritic.  No discharge.  Discussed water-based moisturizers that are unscented.  Could consider estrogen cream although patient is a non-smoker.  Follow-up as needed.  Need for shingles vaccine - Zoster Vaccine Adjuvanted [facility]) injection; Inject 0.5 mLs into the muscle once for 1 dose.  Dispense: 0.5 mL; Refill: 0      Tammy Fee, DO [place]

## 2020-08-10 LAB — CBC
Hematocrit: 39 % (ref 34.0–46.6)
Hemoglobin: 13.4 g/dL (ref 11.1–15.9)
MCH: 31.3 pg (ref 26.6–33.0)
MCHC: 34.4 g/dL (ref 31.5–35.7)
MCV: 91 fL (ref 79–97)
Platelets: 276 10*3/uL (ref 150–450)
RBC: 4.28 x10E6/uL (ref 3.77–5.28)
RDW: 12.9 % (ref 11.7–15.4)
WBC: 9.3 10*3/uL (ref 3.4–10.8)

## 2020-08-10 LAB — BASIC METABOLIC PANEL
BUN/Creatinine Ratio: 9 — ABNORMAL LOW (ref 12–28)
BUN: 5 mg/dL — ABNORMAL LOW (ref 8–27)
CO2: 23 mmol/L (ref 20–29)
Calcium: 9.3 mg/dL (ref 8.7–10.3)
Chloride: 100 mmol/L (ref 96–106)
Creatinine, Ser: 0.54 mg/dL — ABNORMAL LOW (ref 0.57–1.00)
Glucose: 151 mg/dL — ABNORMAL HIGH (ref 65–99)
Potassium: 3.2 mmol/L — ABNORMAL LOW (ref 3.5–5.2)
Sodium: 143 mmol/L (ref 134–144)
eGFR: 100 mL/min/{1.73_m2} (ref >59)

## 2020-08-10 LAB — MAGNESIUM: Magnesium: 1.4 mg/dL — ABNORMAL LOW (ref 1.6–2.3)

## 2020-08-10 LAB — TSH: TSH: 0.682 u[IU]/mL (ref 0.450–4.500)

## 2020-08-11 NOTE — Assessment & Plan Note (Signed)
A1c 8.3 today.  Patient states she recently had a birthday and has not been doing well with her diet.  Does not want to add any medication changes she would like to improve A1c with lifestyle changes.  We did discuss having a follow-up in 6 weeks with CBG logs and a subsequent follow-up in another 6 weeks for repeat A1c.  At that time if no improvement will consider SG LT 2 inhibitor versus GLP-1 agonist.

## 2020-08-11 NOTE — Assessment & Plan Note (Addendum)
Unknown etiology. And symptom complaint of unsteady gait with normal neuro exam. Likely chronic. Orthostatic VS WNL. Patient has a history of vitamin B deficiency last labs are normal.  Last TSH was found to be low we will repeat today.  We will also obtain CBC for possible anemia causes. Chart review reveals this was address 2 years prior and sleep study was consider. If not already done will consider.

## 2020-08-12 ENCOUNTER — Telehealth: Payer: Self-pay | Admitting: Family Medicine

## 2020-08-12 DIAGNOSIS — J3089 Other allergic rhinitis: Secondary | ICD-10-CM | POA: Diagnosis not present

## 2020-08-12 DIAGNOSIS — J301 Allergic rhinitis due to pollen: Secondary | ICD-10-CM | POA: Diagnosis not present

## 2020-08-12 NOTE — Telephone Encounter (Signed)
Called patient. LVM. She will need to continue magnesium supplement.   Gerlene Fee, DO 08/12/2020, 12:21 PM PGY-3, [place]

## 2020-08-13 ENCOUNTER — Ambulatory Visit: Payer: Medicare Other | Admitting: Podiatry

## 2020-08-13 ENCOUNTER — Other Ambulatory Visit: Payer: Self-pay

## 2020-08-13 DIAGNOSIS — M79674 Pain in right toe(s): Secondary | ICD-10-CM | POA: Diagnosis not present

## 2020-08-13 DIAGNOSIS — B351 Tinea unguium: Secondary | ICD-10-CM

## 2020-08-13 DIAGNOSIS — M79675 Pain in left toe(s): Secondary | ICD-10-CM | POA: Diagnosis not present

## 2020-08-13 DIAGNOSIS — J3081 Allergic rhinitis due to animal (cat) (dog) hair and dander: Secondary | ICD-10-CM | POA: Insufficient documentation

## 2020-08-14 ENCOUNTER — Telehealth: Payer: Self-pay

## 2020-08-14 NOTE — Telephone Encounter (Signed)
Patient calls nurse line requesting to speak with provider regarding results from visit on Friday, 7/29.   Please advise.   Talbot Grumbling, RN

## 2020-08-15 NOTE — Telephone Encounter (Signed)
Patient came into the office wanting to check on receiving the results from her last appointment 08/09/20. I informed her that the nurse had taken a message and sent it to PCP.   Patient asks for someone to call her as soon as they can with results. She said it would be okay to leave results on voicemail if she does not answer.   Please advise

## 2020-08-16 ENCOUNTER — Other Ambulatory Visit: Payer: Self-pay

## 2020-08-17 ENCOUNTER — Encounter: Payer: Self-pay | Admitting: Podiatry

## 2020-08-17 NOTE — Progress Notes (Signed)
  Subjective:  Patient ID: [name], female    DOB: 1950/12/05,  MRN: [id number]  [name] presents to clinic today for preventative diabetic foot care and painful thick toenails that are difficult to trim. Pain interferes with ambulation. Aggravating factors include wearing enclosed shoe gear. Pain is relieved with periodic professional debridement.  Patient states blood glucose was 154 mg/dl today.  PCP is [name], [name], DO , and last visit was 08/09/2020.  Allergies  Allergen Reactions   Alcohol Swabs [Isopropyl Alcohol] Nausea Only    Use peroxide instead   Penicillins Other (See Comments)    Arm swelling   Prednisone Other (See Comments)    Could not sleep   Amoxicillin-Pot Clavulanate Other (See Comments)    She is unsure what reaction she had    Review of Systems: Negative except as noted in the HPI. Objective:   Constitutional [name] is a pleasant 70 y.o. African American female, WD, WN in NAD. AAO x 3.   Vascular Capillary refill time to digits immediate b/l. Palpable DP pulse(s) b/l lower extremities Palpable PT pulse(s) b/l lower extremities Pedal hair absent. Lower extremity skin temperature gradient within normal limits. No pain with calf compression b/l. No cyanosis or clubbing noted.  Neurologic Normal speech. Oriented to person, place, and time. Pt has subjective symptoms of neuropathy. Protective sensation intact 5/5 intact bilaterally with 10g monofilament b/l. Vibratory sensation intact b/l.  Dermatologic Pedal skin with normal turgor, texture and tone b/l lower extremities. No open wounds b/l lower extremities. No interdigital macerations b/l lower extremities. Toenails 1-5 b/l elongated, discolored, dystrophic, thickened, crumbly with subungual debris and tenderness to dorsal palpation.  Orthopedic: Normal muscle strength 5/5 to all lower extremity muscle groups bilaterally. Hallux valgus with bunion deformity noted b/l lower extremities.    Radiographs: None Assessment:   1. Pain due to onychomycosis of toenails of both feet    Plan:  -No new findings. No new orders. -Continue diabetic foot care principles. -Patient to continue soft, supportive shoe gear daily. -Toenails 1-5 b/l were debrided in length and girth with sterile nail nippers and dremel without iatrogenic bleeding.  -Patient to report any pedal injuries to medical professional immediately. -Patient/POA to call should there be question/concern in the interim.  No follow-ups on file.  Marzetta Board, DPM

## 2020-08-19 DIAGNOSIS — J3089 Other allergic rhinitis: Secondary | ICD-10-CM | POA: Diagnosis not present

## 2020-08-19 DIAGNOSIS — J301 Allergic rhinitis due to pollen: Secondary | ICD-10-CM | POA: Diagnosis not present

## 2020-08-19 NOTE — Telephone Encounter (Signed)
Called patient about results. No answer. Detailed voicemail message left per patient request.   Gerlene Fee, DO 08/19/2020, 5:26 PM PGY-3, [place]

## 2020-08-21 ENCOUNTER — Other Ambulatory Visit: Payer: Self-pay

## 2020-08-26 ENCOUNTER — Other Ambulatory Visit: Payer: Self-pay

## 2020-08-26 ENCOUNTER — Other Ambulatory Visit: Payer: Self-pay | Admitting: Family Medicine

## 2020-08-26 DIAGNOSIS — F411 Generalized anxiety disorder: Secondary | ICD-10-CM

## 2020-08-28 DIAGNOSIS — J3089 Other allergic rhinitis: Secondary | ICD-10-CM | POA: Diagnosis not present

## 2020-08-28 DIAGNOSIS — J301 Allergic rhinitis due to pollen: Secondary | ICD-10-CM | POA: Diagnosis not present

## 2020-09-04 ENCOUNTER — Other Ambulatory Visit: Payer: Self-pay | Admitting: Family Medicine

## 2020-09-04 DIAGNOSIS — I10 Essential (primary) hypertension: Secondary | ICD-10-CM

## 2020-09-05 DIAGNOSIS — J3089 Other allergic rhinitis: Secondary | ICD-10-CM | POA: Diagnosis not present

## 2020-09-05 DIAGNOSIS — J301 Allergic rhinitis due to pollen: Secondary | ICD-10-CM | POA: Diagnosis not present

## 2020-09-12 DIAGNOSIS — J301 Allergic rhinitis due to pollen: Secondary | ICD-10-CM | POA: Diagnosis not present

## 2020-09-12 DIAGNOSIS — J3089 Other allergic rhinitis: Secondary | ICD-10-CM | POA: Diagnosis not present

## 2020-09-17 DIAGNOSIS — J301 Allergic rhinitis due to pollen: Secondary | ICD-10-CM | POA: Diagnosis not present

## 2020-09-17 DIAGNOSIS — J3089 Other allergic rhinitis: Secondary | ICD-10-CM | POA: Diagnosis not present

## 2020-09-24 DIAGNOSIS — J3089 Other allergic rhinitis: Secondary | ICD-10-CM | POA: Diagnosis not present

## 2020-09-24 DIAGNOSIS — J301 Allergic rhinitis due to pollen: Secondary | ICD-10-CM | POA: Diagnosis not present

## 2020-09-26 ENCOUNTER — Other Ambulatory Visit: Payer: Self-pay

## 2020-09-26 ENCOUNTER — Ambulatory Visit (INDEPENDENT_AMBULATORY_CARE_PROVIDER_SITE_OTHER): Payer: Medicare Other | Admitting: Family Medicine

## 2020-09-26 ENCOUNTER — Encounter: Payer: Self-pay | Admitting: Family Medicine

## 2020-09-26 ENCOUNTER — Other Ambulatory Visit (HOSPITAL_COMMUNITY)
Admission: RE | Admit: 2020-09-26 | Discharge: 2020-09-26 | Disposition: A | Discharge status: Home / Self Care | Payer: Medicare Other | Source: Ambulatory Visit | Attending: Family Medicine | Admitting: Family Medicine

## 2020-09-26 VITALS — BP 151/88 | HR 75 | Wt 156.6 lb

## 2020-09-26 DIAGNOSIS — Z113 Encounter for screening for infections with a predominantly sexual mode of transmission: Secondary | ICD-10-CM | POA: Insufficient documentation

## 2020-09-26 DIAGNOSIS — I10 Essential (primary) hypertension: Secondary | ICD-10-CM | POA: Diagnosis not present

## 2020-09-26 DIAGNOSIS — N949 Unspecified condition associated with female genital organs and menstrual cycle: Secondary | ICD-10-CM

## 2020-09-26 DIAGNOSIS — E876 Hypokalemia: Secondary | ICD-10-CM | POA: Diagnosis not present

## 2020-09-26 DIAGNOSIS — E0840 Diabetes mellitus due to underlying condition with diabetic neuropathy, unspecified: Secondary | ICD-10-CM | POA: Diagnosis not present

## 2020-09-26 DIAGNOSIS — M5440 Lumbago with sciatica, unspecified side: Secondary | ICD-10-CM | POA: Diagnosis not present

## 2020-09-26 DIAGNOSIS — G8929 Other chronic pain: Secondary | ICD-10-CM

## 2020-09-26 DIAGNOSIS — Z23 Encounter for immunization: Secondary | ICD-10-CM

## 2020-09-26 LAB — POCT WET PREP (WET MOUNT)
Clue Cells Wet Prep Whiff POC: NEGATIVE
Trichomonas Wet Prep HPF POC: ABSENT

## 2020-09-26 MED ORDER — VALSARTAN 40 MG PO TABS: 20.0000 mg | ORAL_TABLET | Freq: Every day | ORAL | 3 refills | Status: DC

## 2020-09-26 MED ORDER — TRAMADOL HCL 50 MG PO TABS: ORAL_TABLET | ORAL | 0 refills | Status: DC

## 2020-09-26 MED ORDER — ATENOLOL 25 MG PO TABS: 25.0000 mg | ORAL_TABLET | Freq: Every day | ORAL | 1 refills | Status: DC

## 2020-09-26 NOTE — Progress Notes (Signed)
  r   SUBJECTIVE:   CHIEF COMPLAINT / HPI:   [name] is a 70 yo F who presents for follow up.   DM Did not bring CBG logs today as instructed for follow up.   Medication refill Needs refill on atenolol, valsartan, and tramadol.   Pelvic exam Desires pap. Pap exams were all normal in the past. Denies vaginal discharge, vaginal bleeding, odor, and dysuria. Desires STD testing.  *Discussed aged out of pap smears. Patient desired pelvic swabs for her "sanity".   Hx of hypokalemia, hypomagnesia Supplementing with magnesium. Would like repeat labs.   HM  Flu shot desired.    PERTINENT  PMH / PSH: HTN, tobacco use disorder  OBJECTIVE:   BP (!) 151/88   Pulse 75   Wt 156 lb 9.6 oz (71 kg)   SpO2 100%   BMI 26.06 kg/m   .General: Appears well, no acute distress. Age appropriate. Cardiac: RRR, normal heart sounds, no murmurs Respiratory: CTAB, normal effort Pelvic exam: VULVA: normal appearing vulva with no masses, tenderness or lesions, VAGINA: normal appearing vagina with normal color and discharge, no lesions, CERVIX: normal appearing cervix without discharge or lesions, WET MOUNT done - results: negative for pathogens, normal epithelial cells, excessive bacteria, exam chaperoned by Delray Alt, CMA. Extremities: No edema or cyanosis. Skin: Warm and dry, no rashes noted Neuro: alert and oriented, no focal deficits Psych: normal affect   ASSESSMENT/PLAN:   Diabetes mellitus due to underlying condition, controlled, with diabetic neuropathy, without long-term current use of insulin (HCC) -Continue current medications -Bring CBG logs to follow up -A1c in 1 month  Vaginal concern Asymptomatic. Discussed extensively why we do not do Pap tests after 70 years of age. Patient desired pelvic exam and swabs.  - POCT Wet Prep [facility])  Screening examination for sexually transmitted disease - Cervicovaginal ancillary only  Hypokalemia Chronic. K 3.2, Mg 1.4  [id number]  - Basic Metabolic Panel - Magnesium - Continue magnesium supplement - consider oral potassium tx depending on labs  Need for immunization against influenza - Flu Vaccine QUAD High Dose(Fluad)  Gerlene Fee, DO [place]

## 2020-09-26 NOTE — Patient Instructions (Addendum)
Today we discussed continuing your diabetes blood sugar log.  And follow-up in 1 month for repeat 1 A1c.  Today I refilled your blood pressure medication as well as tramadol..  We also performed a pelvic exam and got blood work.  I will notify you when results are available.   Dr. Janus Molder

## 2020-09-27 LAB — BASIC METABOLIC PANEL
BUN/Creatinine Ratio: 7 — ABNORMAL LOW (ref 12–28)
BUN: 5 mg/dL — ABNORMAL LOW (ref 8–27)
CO2: 27 mmol/L (ref 20–29)
Calcium: 9.5 mg/dL (ref 8.7–10.3)
Chloride: 100 mmol/L (ref 96–106)
Creatinine, Ser: 0.68 mg/dL (ref 0.57–1.00)
Glucose: 163 mg/dL — ABNORMAL HIGH (ref 65–99)
Potassium: 3.4 mmol/L — ABNORMAL LOW (ref 3.5–5.2)
Sodium: 141 mmol/L (ref 134–144)
eGFR: 95 mL/min/{1.73_m2} (ref >59)

## 2020-09-27 LAB — MAGNESIUM: Magnesium: 1.5 mg/dL — ABNORMAL LOW (ref 1.6–2.3)

## 2020-09-27 LAB — CERVICOVAGINAL ANCILLARY ONLY
Chlamydia: NEGATIVE
Comment: NEGATIVE
Comment: NORMAL
Neisseria Gonorrhea: NEGATIVE

## 2020-10-01 ENCOUNTER — Telehealth: Payer: Self-pay | Admitting: Family Medicine

## 2020-10-01 DIAGNOSIS — E876 Hypokalemia: Secondary | ICD-10-CM

## 2020-10-01 MED ORDER — POTASSIUM CHLORIDE CRYS ER 20 MEQ PO TBCR: 20.0000 meq | EXTENDED_RELEASE_TABLET | Freq: Every day | ORAL | 0 refills | Status: DC

## 2020-10-01 NOTE — Telephone Encounter (Signed)
Discussed results with patient. Will treat with 3 days of 20 mEq and obtain labs on 4th day to see response (future order placed). Continue magnesium. Consider need for chronic potassium repletion.  Gerlene Fee, DO 10/01/2020, 5:03 PM PGY-3, [place]

## 2020-10-02 ENCOUNTER — Encounter: Payer: Self-pay | Admitting: Internal Medicine

## 2020-10-02 ENCOUNTER — Other Ambulatory Visit: Payer: Self-pay

## 2020-10-02 ENCOUNTER — Ambulatory Visit: Payer: Medicare Other | Admitting: Internal Medicine

## 2020-10-02 VITALS — BP 136/82 | HR 76 | Ht 65.0 in | Wt 157.2 lb

## 2020-10-02 DIAGNOSIS — J3089 Other allergic rhinitis: Secondary | ICD-10-CM | POA: Diagnosis not present

## 2020-10-02 DIAGNOSIS — I471 Supraventricular tachycardia: Secondary | ICD-10-CM | POA: Diagnosis not present

## 2020-10-02 DIAGNOSIS — J301 Allergic rhinitis due to pollen: Secondary | ICD-10-CM | POA: Diagnosis not present

## 2020-10-02 DIAGNOSIS — H1045 Other chronic allergic conjunctivitis: Secondary | ICD-10-CM | POA: Diagnosis not present

## 2020-10-02 DIAGNOSIS — J4520 Mild intermittent asthma, uncomplicated: Secondary | ICD-10-CM | POA: Diagnosis not present

## 2020-10-02 DIAGNOSIS — K219 Gastro-esophageal reflux disease without esophagitis: Secondary | ICD-10-CM | POA: Diagnosis not present

## 2020-10-02 NOTE — Progress Notes (Signed)
HPI [name] returns today for followup of SVT and HTN. She is a pleasant 70 yo woman with a h/o above, and ongoing tobacco abuse. She had lost weight after her husband died but this has improved and her weight has now stabilized. She has not had syncope or chest pain. No palpitations.  Allergies  Allergen Reactions   Alcohol Swabs [Isopropyl Alcohol] Nausea Only    Use peroxide instead   Penicillins Other (See Comments)    Arm swelling   Prednisone Other (See Comments)    Could not sleep   Amoxicillin-Pot Clavulanate Other (See Comments)    She is unsure what reaction she had     Current Outpatient Medications  Medication Sig Dispense Refill   Accu-Chek Softclix Lancets lancets USE AS DIRECTED 3 TIMES A DAY 100 each 5   acetaminophen (TYLENOL) 500 MG tablet Take 500 mg by mouth every 6 (six) hours as needed.     aspirin 81 MG chewable tablet Chew 1 tablet (81 mg total) by mouth daily. 100 tablet 6   atenolol (TENORMIN) 25 MG tablet Take 1 tablet (25 mg total) by mouth daily. 90 tablet 1   atorvastatin (LIPITOR) 40 MG tablet Take 1 tablet by mouth once daily 90 tablet 0   baclofen (LIORESAL) 10 MG tablet Take 1 tablet (10 mg total) by mouth daily as needed for muscle spasms. 30 tablet 1   cetirizine (ZYRTEC) 10 MG tablet 1 tablet as needed     citalopram (CELEXA) 20 MG tablet TAKE 1 TABLET BY MOUTH EVERY DAY 90 tablet 3   clonazePAM (KLONOPIN) 0.5 MG tablet 1 tablet at bedtime.     EPINEPHrine 0.3 mg/0.3 mL IJ SOAJ injection See admin instructions.     glipiZIDE (GLUCOTROL) 10 MG tablet TAKE ONE TABLET BY MOUTH TWICE A DAY BEFORE A MEAL 180 tablet 3   glucose blood test strip Use as instructed 100 each 12   hydrochlorothiazide (MICROZIDE) 12.5 MG capsule TAKE 1 CAPSULE BY MOUTH EVERY DAY 90 capsule 3   magnesium oxide (MAG-OX) 400 (240 Mg) MG tablet TAKE ONE TABLET BY MOUTH DAILY 30 tablet 0   meclizine (ANTIVERT) 25 MG tablet Take 1-2 tablets (25-50 mg total) by mouth 2  (two) times daily as needed for dizziness. if having symptoms.  May repeat in 12 hours later. (Patient taking differently: Take 25-50 mg by mouth 2 (two) times daily as needed for dizziness. if having symptoms.  May repeat in 12 hours later.) 30 tablet 2   metFORMIN (GLUCOPHAGE) 1000 MG tablet TAKE ONE TABLET BY MOUTH TWICE A DAY WITH MEALS 180 tablet 3   Olopatadine HCl 0.6 % SOLN      pantoprazole (PROTONIX) 40 MG tablet Take 1 tablet (40 mg total) by mouth 2 (two) times daily. For two weeks, then go back to once daily. 90 tablet 2   Polyethylene Glycol 400 (BLINK TEARS OP)      potassium chloride SA (KLOR-CON) 20 MEQ tablet Take 1 tablet (20 mEq total) by mouth daily for 3 days. 3 tablet 0   sucralfate (CARAFATE) 1 GM/10ML suspension Take 10 mLs (1 g total) by mouth 4 (four) times daily -  with meals and at bedtime. 420 mL 0   traMADol (ULTRAM) 50 MG tablet TAKE 1 TABLET BY MOUTH THREE TIMES DAILY AS NEEDED FOR  LOW  BACK  PAIN 30 tablet 0   Triamcinolone Acetonide (NASACORT AQ NA)      valsartan (DIOVAN) 40  MG tablet Take 0.5 tablets (20 mg total) by mouth daily. 90 tablet 3   No current facility-administered medications for this visit.     Past Medical History:  Diagnosis Date   Allergy    Anxiety    AR (allergic rhinitis)    Asthma    Cataract    CHF (congestive heart failure) (HCC)    Colon polyps    hyperplastic   Disturbance of skin sensation    facial paresthesia; left   DM2 (diabetes mellitus, type 2) (HCC)    Dyslipidemia    Fatty liver    GERD (gastroesophageal reflux disease)    Hemorrhoids    Hyperlipidemia    Hypertension    essential, benign    Insomnia    Menopausal syndrome    Palpitations    hx   Routine general medical examination at a health care facility    Screening for malignant neoplasm of the cervix    Skin lesion    SVT (supraventricular tachycardia) (HCC)    Tobacco abuse    Weakness     ROS:   All systems reviewed and negative except as  noted in the HPI.   Past Surgical History:  Procedure Laterality Date   ABDOMINAL HYSTERECTOMY  1990   For fibroids benign.  Still has ovaries   ablation for SVT  9/09   Dr. Lovena Le   APPENDECTOMY     CATARACT EXTRACTION, BILATERAL     CESAREAN SECTION     COLONOSCOPY  2009   dental extractions     HEMORRHOID BANDING  2014   w/Dr.Medoff   SIGMOIDOSCOPY  2004     Family History  Problem Relation Age of Onset   Heart attack Father 76   Hypertension Mother    Stroke Mother    Diabetes Mother    Stomach cancer Mother    Cancer Mother    Diabetes Brother    Coronary artery disease Other        female, 1st degree relative <50   Stomach cancer Maternal Aunt    Diabetes Brother    Colon cancer Neg Hx    Esophageal cancer Neg Hx      Social History   Socioeconomic History   Marital status: Widowed    Spouse name: Not on file   Number of children: 3   Years of education: 13   Highest education level: Some college, no degree  Occupational History   Occupation: Retired    Comment: Dance movement psychotherapist  Tobacco Use   Smoking status: Every Day    Packs/day: 1.00    Types: Cigarettes   Smokeless tobacco: Never  Vaping Use   Vaping Use: Never used  Substance and Sexual Activity   Alcohol use: Not Currently   Drug use: No   Sexual activity: Not Currently  Other Topics Concern   Not on file  Social History Narrative   Married, does not get regular exercise. On disability.    2 miscarriages, one at 4 and one at 5 months; death at 1 day, unsure of cause.       Health Care POA: on file   Emergency Contact: Jeanene Erb (friend) [phone] Terri Piedra (friend's daughter) [phone]   End of Life Plan: Gave pt pamphlet   Lives in 2 story apartment. Handrails on stairs. Smoke alarms, has throw rugs with backing on them. No grab bars in bathroom.   Who lives with you: husband   Any pets: 0  Diet: pt has varied diet. Eats vegetables, some  fruit, and meat.   Exercise: pt is disabled and does not have a regular exercise routine   Seatbelts: Pt reports wearing seatbelt when in vehicle   Sun Exposure/Protection: Pt reports wearing a hat, sunglasses and facial sunscreen when outside   Hobbies: shopping    Social Determinants of Health   Financial Resource Strain: Not on file  Food Insecurity: Not on file  Transportation Needs: Not on file  Physical Activity: Not on file  Stress: Not on file  Social Connections: Not on file  Intimate Partner Violence: Not on file     BP 136/82   Pulse 76   Ht 5\' 5"  (1.651 m)   Wt 157 lb 3.2 oz (71.3 kg)   SpO2 98%   BMI 26.16 kg/m   Physical Exam:  Well appearing NAD HEENT: Unremarkable Neck:  No JVD, no thyromegally Lymphatics:  No adenopathy Back:  No CVA tenderness Lungs:  Clear with no wheezes HEART:  Regular rate rhythm, no murmurs, no rubs, no clicks Abd:  soft, positive bowel sounds, no organomegally, no rebound, no guarding Ext:  2 plus pulses, no edema, no cyanosis, no clubbing Skin:  No rashes no nodules Neuro:  CN II through XII intact, motor grossly intact  EKG - nsr wiith non-specific STT changes  DEVICE  Normal device function.  See PaceArt for details.   Assess/Plan:  1. SVT - she has been asymptomatic. Continue current meds. 2. HTN - her bp is minimally elevated. She will continue her current meds 3. Tobacco abuse - I encouraged her to stop smoking. 4. Weight loss - this has subsided. She was encouraged to stay where she is and she has   [name]

## 2020-10-02 NOTE — Patient Instructions (Signed)
Medication Instructions:  Your physician recommends that you continue on your current medications as directed. Please refer to the Current Medication list given to you today.  *If you need a refill on your cardiac medications before your next appointment, please call your pharmacy*   Lab Work: none If you have labs (blood work) drawn today and your tests are completely normal, you will receive your results only by: [place] (if you have MyChart) OR A paper copy in the mail If you have any lab test that is abnormal or we need to change your treatment, we will call you to review the results.   Testing/Procedures: none   Follow-Up: At [facility], you and your health needs are our priority.  As part of our continuing mission to provide you with exceptional heart care, we have created designated Provider Care Teams.  These Care Teams include your primary Cardiologist (physician) and Advanced Practice Providers (APPs -  Physician Assistants and Nurse Practitioners) who all work together to provide you with the care you need, when you need it.  We recommend signing up for the patient portal called "MyChart".  Sign up information is provided on this After Visit Summary.  MyChart is used to connect with patients for Virtual Visits (Telemedicine).  Patients are able to view lab/test results, encounter notes, upcoming appointments, etc.  Non-urgent messages can be sent to your provider as well.   To learn more about what you can do with MyChart, go to [email or web address].    Your next appointment:   1 year(s)  The format for your next appointment:   In Person  Provider:   Cristopher Peru, MD   Other Instructions

## 2020-10-07 ENCOUNTER — Other Ambulatory Visit: Payer: Medicare Other

## 2020-10-07 ENCOUNTER — Other Ambulatory Visit: Payer: Self-pay

## 2020-10-07 DIAGNOSIS — E876 Hypokalemia: Secondary | ICD-10-CM | POA: Diagnosis not present

## 2020-10-08 LAB — BASIC METABOLIC PANEL
BUN/Creatinine Ratio: 12 (ref 12–28)
BUN: 7 mg/dL — ABNORMAL LOW (ref 8–27)
CO2: 23 mmol/L (ref 20–29)
Calcium: 9.6 mg/dL (ref 8.7–10.3)
Chloride: 103 mmol/L (ref 96–106)
Creatinine, Ser: 0.59 mg/dL (ref 0.57–1.00)
Glucose: 225 mg/dL — ABNORMAL HIGH (ref 70–99)
Potassium: 3.7 mmol/L (ref 3.5–5.2)
Sodium: 141 mmol/L (ref 134–144)
eGFR: 98 mL/min/{1.73_m2} (ref >59)

## 2020-10-08 LAB — MAGNESIUM: Magnesium: 1.5 mg/dL — ABNORMAL LOW (ref 1.6–2.3)

## 2020-10-09 ENCOUNTER — Telehealth: Payer: Self-pay | Admitting: Family Medicine

## 2020-10-09 DIAGNOSIS — E876 Hypokalemia: Secondary | ICD-10-CM

## 2020-10-09 MED ORDER — POTASSIUM CHLORIDE ER 10 MEQ PO TBCR: 10.0000 meq | EXTENDED_RELEASE_TABLET | Freq: Every day | ORAL | 0 refills | Status: DC

## 2020-10-09 NOTE — Addendum Note (Signed)
Addended by: Caralee Ates on: 10/09/2020 08:03 PM   Modules accepted: Orders

## 2020-10-09 NOTE — Telephone Encounter (Signed)
Discussed results with patient. Will send in 10 mEq daily. Instructed to continue magnesium supplement. Voiced understanding. Of note, CBG down to 88 today. Patient has OJ and resolved to 225>130. Plan to check again at [place]. Provider on overnight. Discussed with patient calling back if needed. She voiced understanding.   Yi Falletta [name], DO 10/09/2020, 8:00 PM PGY-3, [place]

## 2020-10-10 DIAGNOSIS — J301 Allergic rhinitis due to pollen: Secondary | ICD-10-CM | POA: Diagnosis not present

## 2020-10-10 DIAGNOSIS — J3089 Other allergic rhinitis: Secondary | ICD-10-CM | POA: Diagnosis not present

## 2020-10-16 DIAGNOSIS — J301 Allergic rhinitis due to pollen: Secondary | ICD-10-CM | POA: Diagnosis not present

## 2020-10-16 DIAGNOSIS — J3089 Other allergic rhinitis: Secondary | ICD-10-CM | POA: Diagnosis not present

## 2020-10-22 DIAGNOSIS — H1045 Other chronic allergic conjunctivitis: Secondary | ICD-10-CM | POA: Diagnosis not present

## 2020-10-22 DIAGNOSIS — J3089 Other allergic rhinitis: Secondary | ICD-10-CM | POA: Diagnosis not present

## 2020-10-22 DIAGNOSIS — J45901 Unspecified asthma with (acute) exacerbation: Secondary | ICD-10-CM | POA: Diagnosis not present

## 2020-10-22 DIAGNOSIS — J4520 Mild intermittent asthma, uncomplicated: Secondary | ICD-10-CM | POA: Diagnosis not present

## 2020-10-24 ENCOUNTER — Other Ambulatory Visit: Payer: Self-pay | Admitting: Family Medicine

## 2020-10-24 DIAGNOSIS — E0840 Diabetes mellitus due to underlying condition with diabetic neuropathy, unspecified: Secondary | ICD-10-CM

## 2020-10-28 ENCOUNTER — Other Ambulatory Visit: Payer: Self-pay

## 2020-10-28 ENCOUNTER — Encounter: Payer: Self-pay | Admitting: Family Medicine

## 2020-10-28 ENCOUNTER — Ambulatory Visit (INDEPENDENT_AMBULATORY_CARE_PROVIDER_SITE_OTHER): Payer: Medicare Other | Admitting: Family Medicine

## 2020-10-28 VITALS — BP 120/72 | HR 97 | Ht 65.0 in | Wt 154.8 lb

## 2020-10-28 DIAGNOSIS — E0840 Diabetes mellitus due to underlying condition with diabetic neuropathy, unspecified: Secondary | ICD-10-CM | POA: Diagnosis not present

## 2020-10-28 DIAGNOSIS — M25552 Pain in left hip: Secondary | ICD-10-CM | POA: Diagnosis not present

## 2020-10-28 LAB — POCT GLYCOSYLATED HEMOGLOBIN (HGB A1C): HbA1c, POC (controlled diabetic range): 8.4 % — AB (ref 0.0–7.0)

## 2020-10-28 MED ORDER — EMPAGLIFLOZIN 10 MG PO TABS
10.0000 mg | ORAL_TABLET | Freq: Every day | ORAL | 3 refills | Status: DC
Start: 2020-10-28 — End: 2021-08-05

## 2020-10-28 NOTE — Progress Notes (Addendum)
      SUBJECTIVE:   CHIEF COMPLAINT / HPI:   Ms. Collymore is a 70 yo F who presents for the below.   Diabetes Current Regimen: Metformin 1000 mg BID,  glipizide 10 mg BID,  CBGs: avg 200, 88-270s   Last A1c: 8.3 on 08/09/2020  Denies polyuria, polydipsia, hypoglycemia Statin: Atorvastatin 40 mg daily ACE/ARB: Valsartan 20 mg daily  Left hip pain Pain in left groin and hip that started last week. Does not remember hearing a pop or snap. Denies lifting heavy objects. Denies fall, fever, or recent procedure in the area. Feels like it is on the inside and only seem to occur with movement. Specifically flexing her hip. She has not tried any alleviating factors. Has a chiropractor and plans to see him.   Hx of hypomagnesemia  Taking magnesium supplement. Would like to check her magnesium level today.   PERTINENT  PMH / PSH: HTN, hypokalemia (K 3.7, Mg 1.5 10/07/20)  OBJECTIVE:   BP 120/72   Pulse 97   Ht 5\' 5"  (1.651 m)   Wt 154 lb 12.8 oz (70.2 kg)   SpO2 100%   BMI 25.76 kg/m   General: Appears well, no acute distress. Age appropriate. Cardiac: RRR, normal heart sounds, no murmurs Respiratory: CTAB, normal effort Abdomen: soft, nontender, nondistended MSK/Extremities:  Right hip exam No deformity. FROM with 5/5 strength. Discomfort reproduced with flexion at the hip.  No tenderness to palpation. NVI distally. Negative logroll Negative faber, fadir.  Skin: Warm and dry, no rashes noted Neuro: alert and oriented x3 Psych: normal affect   ASSESSMENT/PLAN:   1. Diabetes mellitus due to underlying condition, controlled, with diabetic neuropathy, without long-term current use of insulin (HCC) A1c unchanged from last visit. Discussed starting SGLT2-I, will start today and follow up in 3 months.  -Continue current regimen in addition to the below - empagliflozin (JARDIANCE) 10 MG TABS tablet; Take 1 tablet (10 mg total) by mouth daily.  Dispense: 90 tablet; Refill: 3  2.  Left hip pain Acute. No known trauma or falls. Exam suggestive for hip flexor strain. Concern for an intra-articular cause is low considering negative log roll. Discussed supportive care measures such as heat, ice, and strengthening stretches. Instructed to follow up if worsens or fails to improve. Plans to see chiropractor.   3. Hypomagnesemia Chronic.  - f/u Magnesium lvl - Continue mag and potassium supplement     Gerlene Fee, DO [place]

## 2020-10-28 NOTE — Patient Instructions (Signed)
We discussed starting jardiance and following up in 3 months. For hip pain you can try the exercises below to strengthen the hip muscles. Also trying ice and voltaren gel. Follow up if this does not get better in a few weeks.   Hip Exercises Ask your health care provider which exercises are safe for you. Do exercises exactly as told by your health care provider and adjust them as directed. It is normal to feel mild stretching, pulling, tightness, or discomfort as you do these exercises. Stop right away if you feel sudden pain or your pain gets worse. Do not begin these exercises until told by your health care provider. Stretching and range-of-motion exercises These exercises warm up your muscles and joints and improve the movement and flexibility of your hip. These exercises also help to relieve pain, numbness, and tingling. You may be asked to limit your range of motion if you had a hip replacement. Talk to your health care provider about these restrictions. Hamstrings, supine  Lie on your back (supine position). Loop a belt or towel over the ball of your left / right foot. The ball of your foot is on the walking surface, right under your toes. Straighten your left / right knee and slowly pull on the belt or towel to raise your leg until you feel a gentle stretch behind your knee (hamstring). Do not let your knee bend while you do this. Keep your other leg flat on the floor. Hold this position for __________ seconds. Slowly return your leg to the starting position. Repeat __________ times. Complete this exercise __________ times a day. Hip rotation  Lie on your back on a firm surface. With your left / right hand, gently pull your left / right knee toward the shoulder that is on the same side of the body. Stop when your knee is pointing toward the ceiling. Hold your left / right ankle with your other hand. Keeping your knee steady, gently pull your left / right ankle toward your other shoulder  until you feel a stretch in your buttocks. Keep your hips and shoulders firmly planted while you do this stretch. Hold this position for __________ seconds. Repeat __________ times. Complete this exercise __________ times a day. Seated stretch This exercise is sometimes called hamstrings and adductors stretch. Sit on the floor with your legs stretched wide. Keep your knees straight during this exercise. Keeping your head and back in a straight line, bend at your waist to reach for your left foot (position A). You should feel a stretch in your right inner thigh (adductors). Hold this position for __________ seconds. Then slowly return to the upright position. Keeping your head and back in a straight line, bend at your waist to reach forward (position B). You should feel a stretch behind both of your thighs and knees (hamstrings). Hold this position for __________ seconds. Then slowly return to the upright position. Keeping your head and back in a straight line, bend at your waist to reach for your right foot (position C). You should feel a stretch in your left inner thigh (adductors). Hold this position for __________ seconds. Then slowly return to the upright position. Repeat __________ times. Complete this exercise __________ times a day. Lunge This exercise stretches the muscles of the hip (hip flexors). Place your left / right knee on the floor and bend your other knee so that is directly over your ankle. You should be half-kneeling. Keep good posture with your head over your shoulders. Tighten  your buttocks to point your tailbone downward. This will prevent your back from arching too much. You should feel a gentle stretch in the front of your left / right thigh and hip. If you do not feel a stretch, slide your other foot forward slightly and then slowly lunge forward with your chest up until your knee once again lines up over your ankle. Make sure your tailbone continues to point  downward. Hold this position for __________ seconds. Slowly return to the starting position. Repeat __________ times. Complete this exercise __________ times a day. Strengthening exercises These exercises build strength and endurance in your hip. Endurance is the ability to use your muscles for a long time, even after they get tired. Bridge This exercise strengthens the muscles of your hip (hip extensors). Lie on your back on a firm surface with your knees bent and your feet flat on the floor. Tighten your buttocks muscles and lift your bottom off the floor until the trunk of your body and your hips are level with your thighs. Do not arch your back. You should feel the muscles working in your buttocks and the back of your thighs. If you do not feel these muscles, slide your feet 1-2 inches (2.5-5 cm) farther away from your buttocks. Hold this position for __________ seconds. Slowly lower your hips to the starting position. Let your muscles relax completely between repetitions. Repeat __________ times. Complete this exercise __________ times a day. Straight leg raises, side-lying This exercise strengthens the muscles that move the hip joint away from the center of the body (hip abductors). Lie on your side with your left / right leg in the top position. Lie so your head, shoulder, hip, and knee line up. You may bend your bottom knee slightly to help you balance. Roll your hips slightly forward, so your hips are stacked directly over each other and your left / right knee is facing forward. Leading with your heel, lift your top leg 4-6 inches (10-15 cm). You should feel the muscles in your top hip lifting. Do not let your foot drift forward. Do not let your knee roll toward the ceiling. Hold this position for __________ seconds. Slowly return to the starting position. Let your muscles relax completely between repetitions. Repeat __________ times. Complete this exercise __________ times a  day. Straight leg raises, side-lying This exercise strengthens the muscles that move the hip joint toward the center of the body (hip adductors). Lie on your side with your left / right leg in the bottom position. Lie so your head, shoulder, hip, and knee line up. You may place your upper foot in front to help you balance. Roll your hips slightly forward, so your hips are stacked directly over each other and your left / right knee is facing forward. Tense the muscles in your inner thigh and lift your bottom leg 4-6 inches (10-15 cm). Hold this position for __________ seconds. Slowly return to the starting position. Let your muscles relax completely between repetitions. Repeat __________ times. Complete this exercise __________ times a day. Straight leg raises, supine This exercise strengthens the muscles in the front of your thigh (quadriceps). Lie on your back (supine position) with your left / right leg extended and your other knee bent. Tense the muscles in the front of your left / right thigh. You should see your kneecap slide up or see increased dimpling just above your knee. Keep these muscles tight as you raise your leg 4-6 inches (10-15 cm) off the  floor. Do not let your knee bend. Hold this position for __________ seconds. Keep these muscles tense as you lower your leg. Relax the muscles slowly and completely between repetitions. Repeat __________ times. Complete this exercise __________ times a day. Hip abductors, standing This exercise strengthens the muscles that move the leg and hip joint away from the center of the body (hip abductors). Tie one end of a rubber exercise band or tubing to a secure surface, such as a chair, table, or pole. Loop the other end of the band or tubing around your left / right ankle. Keeping your ankle with the band or tubing directly opposite the secured end, step away until there is tension in the tubing or band. Hold on to a chair, table, or pole as  needed for balance. Lift your left / right leg out to your side. While you do this: Keep your back upright. Keep your shoulders over your hips. Keep your toes pointing forward. Make sure to use your hip muscles to slowly lift your leg. Do not tip your body or forcefully lift your leg. Hold this position for __________ seconds. Slowly return to the starting position. Repeat __________ times. Complete this exercise __________ times a day. Squats This exercise strengthens the muscles in the front of your thigh (quadriceps). Stand in a door frame so your feet and knees are in line with the frame. You may place your hands on the frame for balance. Slowly bend your knees and lower your hips like you are going to sit in a chair. Keep your lower legs in a straight-up-and-down position. Do not let your hips go lower than your knees. Do not bend your knees lower than told by your health care provider. If your hip pain increases, do not bend as low. Hold this position for ___________ seconds. Slowly push with your legs to return to standing. Do not use your hands to pull yourself to standing. Repeat __________ times. Complete this exercise __________ times a day. This information is not intended to replace advice given to you by your health care provider. Make sure you discuss any questions you have with your health care provider. Document Revised: 08/04/2018 Document Reviewed: 11/09/2017 Elsevier Patient Education  2022 [employer].

## 2020-10-29 DIAGNOSIS — J3089 Other allergic rhinitis: Secondary | ICD-10-CM | POA: Diagnosis not present

## 2020-10-29 DIAGNOSIS — J301 Allergic rhinitis due to pollen: Secondary | ICD-10-CM | POA: Diagnosis not present

## 2020-10-29 LAB — MAGNESIUM: Magnesium: 1.5 mg/dL — ABNORMAL LOW (ref 1.6–2.3)

## 2020-11-02 ENCOUNTER — Telehealth: Payer: Self-pay | Admitting: Family Medicine

## 2020-11-02 NOTE — Telephone Encounter (Signed)
Discussed stable mag level. Stated she has seen her chiropracter and stated he agreed with the exercises but she now has bruising in the area. She thinks where she picked up a case of soda. I will plan to look a the bruising at our next appointment 11/7.   Gerlene Fee, DO 11/02/2020, 8:46 PM PGY-3, [place]

## 2020-11-05 ENCOUNTER — Other Ambulatory Visit: Payer: Self-pay | Admitting: Family Medicine

## 2020-11-05 DIAGNOSIS — J301 Allergic rhinitis due to pollen: Secondary | ICD-10-CM | POA: Diagnosis not present

## 2020-11-05 DIAGNOSIS — J3089 Other allergic rhinitis: Secondary | ICD-10-CM | POA: Diagnosis not present

## 2020-11-05 DIAGNOSIS — E876 Hypokalemia: Secondary | ICD-10-CM

## 2020-11-06 ENCOUNTER — Other Ambulatory Visit: Payer: Self-pay | Admitting: Family Medicine

## 2020-11-06 DIAGNOSIS — E876 Hypokalemia: Secondary | ICD-10-CM

## 2020-11-06 DIAGNOSIS — G8929 Other chronic pain: Secondary | ICD-10-CM

## 2020-11-06 DIAGNOSIS — M5441 Lumbago with sciatica, right side: Secondary | ICD-10-CM

## 2020-11-12 DIAGNOSIS — J301 Allergic rhinitis due to pollen: Secondary | ICD-10-CM | POA: Diagnosis not present

## 2020-11-12 DIAGNOSIS — J3089 Other allergic rhinitis: Secondary | ICD-10-CM | POA: Diagnosis not present

## 2020-11-17 NOTE — Progress Notes (Signed)
    SUBJECTIVE:   CHIEF COMPLAINT / HPI:   [name] is a 70 yo F who presents for her COVID booster and follow up.   Left hip pain, follow up  10/28/20 Visit Pain in left groin and hip that started last week. Does not remember hearing a pop or snap. Denies lifting heavy objects. Denies fall, fever, or recent procedure in the area. Feels like it is on the inside and only seem to occur with movement. Specifically flexing her hip. She has not tried any alleviating factors. Has a chiropractor and plans to see him.   11/02/20 Phone note Stated she has seen her chiropracter and stated he agreed with the exercises but she now has bruising in the area. She thinks where she picked up a case of sodas. I will plan to look a the bruising at our next appointment 11/7.   Today she is feeling much better and bruising and pain is now gone. She plans to continue to go to her chiropractor every other week.   Diabetes Current Regimen: Metformin 1000 mg BID, glipizide 10 mg BID, jardiance 10 mg daily (added last visit) CBGs: Avg 120-130  Last A1c: 8.4 on 10/29/2018  Denies polyuria, polydipsia, hypoglycemia Statin: Atorvastatin 40 mg daily ACE/ARB: Valsartan 20 mg daily  PERTINENT  PMH / PSH: As above.   OBJECTIVE:   BP 123/74   Pulse 76   Ht 5\' 5"  (1.651 m)   Wt 155 lb (70.3 kg)   SpO2 99%   BMI 25.79 kg/m   General: Appears well, no acute distress. Age appropriate. Respiratory: normal effort Neuro: alert and oriented  ASSESSMENT/PLAN:   Left hip pain follow up, resolved.  Resolved. Discussed good body mechanics when lifting heavy objects. Prior exercises were not needed. Plans to continue going to chiropractor every other week.   Diabetes mellitus due to underlying condition, controlled, with diabetic neuropathy, without long-term current use of insulin (HCC) CBGs that were reported are appropriate. Recently added jardiance. Plan to recheck A1c in January.   Need for  vaccination COVID booster given today. Tolerated well.   [name], [place]

## 2020-11-18 ENCOUNTER — Other Ambulatory Visit: Payer: Self-pay

## 2020-11-18 ENCOUNTER — Ambulatory Visit (INDEPENDENT_AMBULATORY_CARE_PROVIDER_SITE_OTHER): Payer: Medicare Other

## 2020-11-18 ENCOUNTER — Ambulatory Visit (INDEPENDENT_AMBULATORY_CARE_PROVIDER_SITE_OTHER): Payer: Medicare Other | Admitting: Family Medicine

## 2020-11-18 ENCOUNTER — Encounter: Payer: Self-pay | Admitting: Family Medicine

## 2020-11-18 VITALS — BP 123/74 | HR 76 | Ht 65.0 in | Wt 155.0 lb

## 2020-11-18 DIAGNOSIS — Z23 Encounter for immunization: Secondary | ICD-10-CM

## 2020-11-18 DIAGNOSIS — E0840 Diabetes mellitus due to underlying condition with diabetic neuropathy, unspecified: Secondary | ICD-10-CM | POA: Diagnosis not present

## 2020-11-18 DIAGNOSIS — M25552 Pain in left hip: Secondary | ICD-10-CM

## 2020-11-18 NOTE — Patient Instructions (Signed)
Thank you for coming today and getting your COVID booster. We will follow up in January for your diabetes. I am glad you feel better.   Dr. Janus Molder

## 2020-11-19 ENCOUNTER — Ambulatory Visit: Payer: Medicare Other | Admitting: Podiatry

## 2020-11-19 DIAGNOSIS — Z961 Presence of intraocular lens: Secondary | ICD-10-CM | POA: Diagnosis not present

## 2020-11-19 DIAGNOSIS — H524 Presbyopia: Secondary | ICD-10-CM | POA: Diagnosis not present

## 2020-11-19 DIAGNOSIS — J301 Allergic rhinitis due to pollen: Secondary | ICD-10-CM | POA: Diagnosis not present

## 2020-11-19 DIAGNOSIS — J3089 Other allergic rhinitis: Secondary | ICD-10-CM | POA: Diagnosis not present

## 2020-11-19 DIAGNOSIS — E119 Type 2 diabetes mellitus without complications: Secondary | ICD-10-CM | POA: Diagnosis not present

## 2020-11-20 ENCOUNTER — Encounter: Payer: Self-pay | Admitting: Podiatry

## 2020-11-20 ENCOUNTER — Other Ambulatory Visit: Payer: Self-pay

## 2020-11-20 ENCOUNTER — Ambulatory Visit: Payer: Medicare Other | Admitting: Podiatry

## 2020-11-20 DIAGNOSIS — E1140 Type 2 diabetes mellitus with diabetic neuropathy, unspecified: Secondary | ICD-10-CM | POA: Diagnosis not present

## 2020-11-20 DIAGNOSIS — M79674 Pain in right toe(s): Secondary | ICD-10-CM

## 2020-11-20 DIAGNOSIS — B351 Tinea unguium: Secondary | ICD-10-CM | POA: Diagnosis not present

## 2020-11-20 DIAGNOSIS — J300 Vasomotor rhinitis: Secondary | ICD-10-CM | POA: Insufficient documentation

## 2020-11-20 DIAGNOSIS — M79675 Pain in left toe(s): Secondary | ICD-10-CM

## 2020-11-24 ENCOUNTER — Encounter: Payer: Self-pay | Admitting: Podiatry

## 2020-11-24 NOTE — Progress Notes (Signed)
Subjective: [name] is a 70 y.o. female patient seen today for follow up for preventative diabetic foot care. She has  painful thick toenails that are difficult to trim. Pain interferes with ambulation. Aggravating factors include wearing enclosed shoe gear. Pain is relieved with periodic professional debridement.  New pedal problems reported today: None.  She states she pulled some abdominal muscles lifting an 80-pack of water from [employer]. She did see Chiropractor and PCP for this.  Patient states their blood glucose was 103 mg/dl today. Patient did not check blood glucose on today.  Patient does not routinely monitor his/her blood glucose.  PCP is [name], Simone, DO. Last visit was: 11/18/2020.  Allergies  Allergen Reactions   Alcohol Swabs [Isopropyl Alcohol] Nausea Only    Use peroxide instead   Penicillins Other (See Comments)    Arm swelling   Prednisone Other (See Comments)    Could not sleep   Amoxicillin-Pot Clavulanate Other (See Comments)    She is unsure what reaction she had    Objective: Physical Exam  General: Patient is a pleasant 70 y.o. African American female WD, WN in NAD. AAO x 3.   Neurovascular Examination: CFT immediate b/l LE. Palpable DP/PT pulses b/l LE. Digital hair absent b/l. Skin temperature gradient WNL b/l. No pain with calf compression b/l. No edema noted b/l. Lower extremity skin temperature gradient within normal limits.  Pt has subjective symptoms of neuropathy. Protective sensation intact 5/5 intact bilaterally with 10g monofilament b/l. Vibratory sensation intact b/l.  Dermatological:  Pedal skin is warm and supple b/l LE. No open wounds b/l LE. No interdigital macerations noted b/l LE. Toenails 1-5 b/l elongated, discolored, dystrophic, thickened, crumbly with subungual debris and tenderness to dorsal palpation.  Musculoskeletal:  Normal muscle strength 5/5 to all lower extremity muscle groups bilaterally. No pain, crepitus  or joint limitation noted with ROM b/l lower extremities. HAV with bunion deformity noted b/l LE.  Assessment: 1. Pain due to onychomycosis of toenails of both feet   2. Type 2 diabetes, controlled, with neuropathy ([place])    Plan: Patient was evaluated and treated and all questions answered. Consent given for treatment as described below: -No new findings. No new orders. -Continue diabetic foot care principles: inspect feet daily, monitor glucose as recommended by PCP and/or Endocrinologist, and follow prescribed diet per PCP, Endocrinologist and/or dietician. -Mycotic toenails 1-5 bilaterally were debrided in length and girth with sterile nail nippers and dremel without incident. -Patient/POA to call should there be question/concern in the interim.  Return in about 3 months (around 02/20/2021).  Marzetta Board, DPM

## 2020-11-25 DIAGNOSIS — J3089 Other allergic rhinitis: Secondary | ICD-10-CM | POA: Diagnosis not present

## 2020-11-25 DIAGNOSIS — J301 Allergic rhinitis due to pollen: Secondary | ICD-10-CM | POA: Diagnosis not present

## 2020-11-27 ENCOUNTER — Other Ambulatory Visit: Payer: Self-pay | Admitting: Family Medicine

## 2020-11-27 DIAGNOSIS — I10 Essential (primary) hypertension: Secondary | ICD-10-CM

## 2020-11-28 ENCOUNTER — Telehealth: Payer: Self-pay

## 2020-11-28 NOTE — Telephone Encounter (Signed)
Patient calls nurse line reporting low CBGs. Patient reports she is on Metformin, Glipizide and Jardiance for DM. Patient reports since starting Jardiance her CBGs have been 85-105. Patient reports she has been dizzy lately, however no other symptoms. Patient reports when her sugars are low she drinks a glass of juice.   Patient reports she would like to stop Jardiance and she how she does.   Apt offered with pharmacy, however patient would like PCP recommendations.   ED precautions given.

## 2020-11-30 NOTE — Telephone Encounter (Signed)
Called. No answer. LVM encouraging patient to follow up with pharmacy. She can stop glipizide for now. Discuss calling to scheduled an appointment. Patient is aware this provider is on vacation next week.   Gerlene Fee, DO 11/30/2020, 3:12 PM PGY-3, [place]

## 2020-12-02 NOTE — Telephone Encounter (Signed)
Patient returns call to nurse line. Scheduled with Dr. Valentina Lucks tomorrow morning for diabetic medication management.   Patient also reports that she has stopped taking Jardiance. Patient reports that she will continue to take glipizide and metformin. Patient states that Vania Rea is the medication she was concerned about.   Advised patient to bring all medications to appointment tomorrow.   Reinforced ED precautions.   Talbot Grumbling, RN

## 2020-12-03 ENCOUNTER — Encounter: Payer: Self-pay | Admitting: Pharmacist

## 2020-12-03 ENCOUNTER — Other Ambulatory Visit: Payer: Self-pay

## 2020-12-03 ENCOUNTER — Ambulatory Visit (INDEPENDENT_AMBULATORY_CARE_PROVIDER_SITE_OTHER): Payer: Medicare Other | Admitting: Pharmacist

## 2020-12-03 DIAGNOSIS — J301 Allergic rhinitis due to pollen: Secondary | ICD-10-CM | POA: Diagnosis not present

## 2020-12-03 DIAGNOSIS — E0840 Diabetes mellitus due to underlying condition with diabetic neuropathy, unspecified: Secondary | ICD-10-CM

## 2020-12-03 DIAGNOSIS — J3089 Other allergic rhinitis: Secondary | ICD-10-CM | POA: Diagnosis not present

## 2020-12-03 NOTE — Patient Instructions (Addendum)
It was great seeing you today!   STOP taking your nighttime dose of glipizide, but continue to take your morning dose.   Start taking Jardiance 10 mg again. If you notice that your readings are running low or you think you are having a UTI, call our office.   Continue to take your metformin.

## 2020-12-03 NOTE — Progress Notes (Signed)
Reviewed: I agree with Dr. Koval's documentation and management. 

## 2020-12-03 NOTE — Progress Notes (Signed)
    S:     Chief Complaint  Patient presents with   Medication Management    Diabetes   Patient arrives in a pleasant mood, ambulating without assistance. Presents for diabetes evaluation, education, and management Patient was referred on 11/19 by Dr. Janus Molder.  Patient was last seen by Primary Care Provider Dr. Janus Molder on 11/7.   Medication adherence reported good .   Current diabetes medications include: glipizide 10 mg BID, metformin 1,000 mg BID Current hypertension medications include: atenolol 25 mg daily, valsartan 20 mg daily  Current hyperlipidemia medications include: atorvastatin 40 mg daily   Patient reports concern of hypoglycemic events after starting Jardiance (empagliflozin) 10 mg, which prompted her to stop taking it on 11/19. She reports her BG readings went from 170s at night to 120 after using the restroom. Lowest number she has seen on her glucometer is a 70 with other readings in the 80s and 90s. Keeps orange juice nearby in case she experiences a hypoglycemic event.   Patient reported dietary habits: Eats 3 meals/day with 2 larger meals and 1 small meal  Breakfast:gritts, eggs, bacon/sausage, small amount of orange juice  Lunch/dinner:hamburger, fries  Snacks:cookies, piece of cake Drinks:1 regular soda  Patient-reported exercise habits: very limited due to injury    Glucose readings in the 110s-230s, with readings primarily in the 110s-140s  O:  Physical Exam Constitutional:      General: Distressed: previously obese.     Appearance: Normal appearance. She is normal weight.  Neurological:     Mental Status: She is alert.    Review of Systems  All other systems reviewed and are negative.  Lab Results  Component Value Date   HGBA1C 8.4 (A) 10/28/2020   Vitals:   12/03/20 1031  BP: 124/70  Pulse: 70  SpO2: 98%    Lipid Panel    Clinical Atherosclerotic Cardiovascular Disease (ASCVD): No  The ASCVD Risk score (Arnett DK, et al., 2019)  failed to calculate for the following reasons:   The valid total cholesterol range is 130 to 320 mg/dL   A/P: Diabetes longstanding currently suboptimally controlled. Patient is able to verbalize appropriate hypoglycemia management plan. Medication adherence appears good. Control is suboptimal due to hypoglycemic events, likely attributable to glipizide. -Restarted SGLT2-I Jardiance (generic name empagliflozin) 10 mg. Counseled on side effects including heightened risk of UTI/yeast infection, and counseled on importance of staying hydrated.  -Instructed patient to call the office if she notices she is having low glucose readings.  - Stopped nighttime glipizide nighttime dose. Continued morning dose.  -Extensively discussed pathophysiology of diabetes, recommended lifestyle interventions, dietary effects on blood sugar control -Counseled on s/sx of and management of hypoglycemia - Counseled on need to increase consumption of protein -Next A1C anticipated January 2023.  - Counseled patient to report any dizziness due to concerns of potential hypotension   Written patient instructions provided.  Total time in face to face counseling 45 minutes.   Follow up Pharmacist Clinic Visit in January 2023. Patient seen with Elyse Jarvis, PharmD Candidate.

## 2020-12-03 NOTE — Assessment & Plan Note (Signed)
Diabetes longstanding currently suboptimally controlled. Patient is able to verbalize appropriate hypoglycemia management plan. Medication adherence appears good. Control is suboptimal due to hypoglycemic events, likely attributable to glipizide. -Restarted SGLT2-I Jardiance (generic name empagliflozin) 10 mg. Counseled on side effects including heightened risk of UTI/yeast infection, and counseled on importance of staying hydrated.  -Instructed patient to call the office if she notices she is having low glucose readings.  - Stopped nighttime glipizide nighttime dose. Continued morning dose.  -Extensively discussed pathophysiology of diabetes, recommended lifestyle interventions, dietary effects on blood sugar control -Counseled on s/sx of and management of hypoglycemia - Counseled on need to increase consumption of protein -Next A1C anticipated January 2023.  - Counseled patient to report any dizziness due to concerns of potential hypotension

## 2020-12-04 ENCOUNTER — Other Ambulatory Visit: Payer: Self-pay | Admitting: Family Medicine

## 2020-12-04 DIAGNOSIS — E876 Hypokalemia: Secondary | ICD-10-CM

## 2020-12-04 NOTE — Telephone Encounter (Signed)
Please advise patient, she need to come in seen to see PCP for Potassium recheck. I have given her a 15 days supply till she sees her PCP

## 2020-12-10 ENCOUNTER — Other Ambulatory Visit: Payer: Self-pay | Admitting: Family Medicine

## 2020-12-10 DIAGNOSIS — K219 Gastro-esophageal reflux disease without esophagitis: Secondary | ICD-10-CM

## 2020-12-10 DIAGNOSIS — J3089 Other allergic rhinitis: Secondary | ICD-10-CM | POA: Diagnosis not present

## 2020-12-10 DIAGNOSIS — J301 Allergic rhinitis due to pollen: Secondary | ICD-10-CM | POA: Diagnosis not present

## 2020-12-10 DIAGNOSIS — M5440 Lumbago with sciatica, unspecified side: Secondary | ICD-10-CM

## 2020-12-10 DIAGNOSIS — G8929 Other chronic pain: Secondary | ICD-10-CM

## 2020-12-16 ENCOUNTER — Encounter: Payer: Self-pay | Admitting: Family Medicine

## 2020-12-16 ENCOUNTER — Other Ambulatory Visit: Payer: Self-pay

## 2020-12-16 ENCOUNTER — Other Ambulatory Visit (HOSPITAL_COMMUNITY)
Admission: RE | Admit: 2020-12-16 | Discharge: 2020-12-16 | Disposition: A | Discharge status: Home / Self Care | Payer: Medicare Other | Source: Ambulatory Visit | Attending: Family Medicine | Admitting: Family Medicine

## 2020-12-16 ENCOUNTER — Ambulatory Visit (INDEPENDENT_AMBULATORY_CARE_PROVIDER_SITE_OTHER): Payer: Medicare Other | Admitting: Family Medicine

## 2020-12-16 VITALS — BP 138/72 | HR 79 | Wt 155.4 lb

## 2020-12-16 DIAGNOSIS — E876 Hypokalemia: Secondary | ICD-10-CM

## 2020-12-16 DIAGNOSIS — J3089 Other allergic rhinitis: Secondary | ICD-10-CM | POA: Diagnosis not present

## 2020-12-16 DIAGNOSIS — N898 Other specified noninflammatory disorders of vagina: Secondary | ICD-10-CM | POA: Diagnosis present

## 2020-12-16 DIAGNOSIS — E0840 Diabetes mellitus due to underlying condition with diabetic neuropathy, unspecified: Secondary | ICD-10-CM

## 2020-12-16 DIAGNOSIS — J301 Allergic rhinitis due to pollen: Secondary | ICD-10-CM | POA: Diagnosis not present

## 2020-12-16 NOTE — Progress Notes (Signed)
    SUBJECTIVE:   CHIEF COMPLAINT / HPI:   Ms. Haring is a 70 yo F who presents for the below.   Repeat K, Mg labs Continues taking her supplements. Here today for lab draw. No concerns at this time about supplements.   Vaginal pruritis Since starting jardiance. Believes she has a yeast infection. Minimal vaginal discharge. States it burns as well but denies dysuria. No fevers.   Diabetes Recent visit with Dr. Valentina Lucks and was told to stop nighttime glipizide but continued to have lows into 60-70s even in the morning. Increased frequency of drinking juice to bring blood sugar back up.   PERTINENT  PMH / PSH: As above.   OBJECTIVE:   BP 138/72   Pulse 79   Wt 155 lb 6.4 oz (70.5 kg)   SpO2 98%   BMI 25.86 kg/m   General: Appears well, no acute distress. Age appropriate. Respiratory: normal effort Pelvic exam: VULVA: normal appearing vulva with no masses, tenderness or lesions, VAGINA: normal appearing vagina with normal color and discharge, no lesions, exam chaperoned by Delray Alt, CMA. Extremities: No edema or cyanosis. Skin: Warm and dry, no rashes noted Neuro: alert and oriented Psych: normal affect   ASSESSMENT/PLAN:   1. Hypokalemia Continue supplement.  - Basic Metabolic Panel  2. Hypomagnesemia Continue supplement - Magnesium  3. Vaginal pruritus Consider UA and urine sample given; UA not completed by lab and specimen had to be discarded. Suspicion for UTI low, if pelvic swab negative consider obtaining UA.  - POCT urinalysis dipstick - Cervicovaginal ancillary only  4. Diabetes mellitus due to underlying condition, controlled, with diabetic neuropathy, without long-term current use of insulin (HCC) Discontinue glipizide. Continue other current medications.    Gerlene Fee, [place]

## 2020-12-16 NOTE — Patient Instructions (Addendum)
Thank you for coming today. I will notify you of labs when available. Results for vaginal irritation should be available in the next day or two. I will call you to let you know what treatment route we will do. We also discussed stop taking glipizide and we will follow up in January.   Dr. Janus Molder

## 2020-12-17 LAB — BASIC METABOLIC PANEL
BUN/Creatinine Ratio: 10 — ABNORMAL LOW (ref 12–28)
BUN: 7 mg/dL — ABNORMAL LOW (ref 8–27)
CO2: 22 mmol/L (ref 20–29)
Calcium: 9.3 mg/dL (ref 8.7–10.3)
Chloride: 102 mmol/L (ref 96–106)
Creatinine, Ser: 0.7 mg/dL (ref 0.57–1.00)
Glucose: 188 mg/dL — ABNORMAL HIGH (ref 70–99)
Potassium: 3.6 mmol/L (ref 3.5–5.2)
Sodium: 141 mmol/L (ref 134–144)
eGFR: 94 mL/min/{1.73_m2} (ref >59)

## 2020-12-17 LAB — MAGNESIUM: Magnesium: 1.9 mg/dL (ref 1.6–2.3)

## 2020-12-18 DIAGNOSIS — J301 Allergic rhinitis due to pollen: Secondary | ICD-10-CM | POA: Diagnosis not present

## 2020-12-18 DIAGNOSIS — J3089 Other allergic rhinitis: Secondary | ICD-10-CM | POA: Diagnosis not present

## 2020-12-18 LAB — CERVICOVAGINAL ANCILLARY ONLY
Bacterial Vaginitis (gardnerella): NEGATIVE
Candida Glabrata: POSITIVE — AB
Candida Vaginitis: NEGATIVE
Comment: NEGATIVE
Comment: NEGATIVE
Comment: NEGATIVE

## 2020-12-19 ENCOUNTER — Telehealth: Payer: Self-pay | Admitting: Family Medicine

## 2020-12-19 ENCOUNTER — Encounter: Payer: Self-pay | Admitting: Family Medicine

## 2020-12-19 DIAGNOSIS — B379 Candidiasis, unspecified: Secondary | ICD-10-CM

## 2020-12-19 MED ORDER — FLUCONAZOLE 150 MG PO TABS: 150.0000 mg | ORAL_TABLET | Freq: Once | ORAL | 0 refills | Status: AC

## 2020-12-19 NOTE — Telephone Encounter (Signed)
Called patient about positive yeast result. LVM as instructed to do so by patient. Rx to pharmacy.   Gerlene Fee, DO 12/19/2020, 1:14 PM PGY-3, [place]

## 2020-12-20 NOTE — Telephone Encounter (Signed)
Patient returns call to nurse line regarding below. Patient states that she is going to stop taking jardiance and restart glipizide and metformin for diabetes management.   Talbot Grumbling, RN

## 2020-12-23 ENCOUNTER — Other Ambulatory Visit: Payer: Self-pay

## 2020-12-23 DIAGNOSIS — J301 Allergic rhinitis due to pollen: Secondary | ICD-10-CM | POA: Diagnosis not present

## 2020-12-23 DIAGNOSIS — E876 Hypokalemia: Secondary | ICD-10-CM

## 2020-12-23 DIAGNOSIS — J3089 Other allergic rhinitis: Secondary | ICD-10-CM | POA: Diagnosis not present

## 2020-12-23 MED ORDER — POTASSIUM CHLORIDE ER 10 MEQ PO TBCR: 10.0000 meq | EXTENDED_RELEASE_TABLET | Freq: Every day | ORAL | 0 refills | Status: DC

## 2020-12-26 ENCOUNTER — Other Ambulatory Visit: Payer: Self-pay

## 2020-12-26 DIAGNOSIS — E0840 Diabetes mellitus due to underlying condition with diabetic neuropathy, unspecified: Secondary | ICD-10-CM

## 2020-12-26 DIAGNOSIS — J301 Allergic rhinitis due to pollen: Secondary | ICD-10-CM | POA: Diagnosis not present

## 2020-12-26 DIAGNOSIS — J3089 Other allergic rhinitis: Secondary | ICD-10-CM | POA: Diagnosis not present

## 2020-12-27 ENCOUNTER — Other Ambulatory Visit: Payer: Self-pay | Admitting: Family Medicine

## 2020-12-27 DIAGNOSIS — E0840 Diabetes mellitus due to underlying condition with diabetic neuropathy, unspecified: Secondary | ICD-10-CM

## 2020-12-27 MED ORDER — GLIPIZIDE 10 MG PO TABS: ORAL_TABLET | ORAL | 0 refills | Status: DC

## 2020-12-31 ENCOUNTER — Other Ambulatory Visit: Payer: Self-pay | Admitting: *Deleted

## 2020-12-31 DIAGNOSIS — J3089 Other allergic rhinitis: Secondary | ICD-10-CM | POA: Diagnosis not present

## 2020-12-31 DIAGNOSIS — J301 Allergic rhinitis due to pollen: Secondary | ICD-10-CM | POA: Diagnosis not present

## 2020-12-31 DIAGNOSIS — E0840 Diabetes mellitus due to underlying condition with diabetic neuropathy, unspecified: Secondary | ICD-10-CM

## 2020-12-31 MED ORDER — METFORMIN HCL 1000 MG PO TABS: 1000.0000 mg | ORAL_TABLET | Freq: Two times a day (BID) | ORAL | 3 refills | Status: DC

## 2021-01-08 DIAGNOSIS — J301 Allergic rhinitis due to pollen: Secondary | ICD-10-CM | POA: Diagnosis not present

## 2021-01-08 DIAGNOSIS — J3089 Other allergic rhinitis: Secondary | ICD-10-CM | POA: Diagnosis not present

## 2021-01-09 DIAGNOSIS — Z1231 Encounter for screening mammogram for malignant neoplasm of breast: Secondary | ICD-10-CM | POA: Diagnosis not present

## 2021-01-15 DIAGNOSIS — J3089 Other allergic rhinitis: Secondary | ICD-10-CM | POA: Diagnosis not present

## 2021-01-15 DIAGNOSIS — J301 Allergic rhinitis due to pollen: Secondary | ICD-10-CM | POA: Diagnosis not present

## 2021-01-20 ENCOUNTER — Other Ambulatory Visit (HOSPITAL_BASED_OUTPATIENT_CLINIC_OR_DEPARTMENT_OTHER): Payer: Self-pay

## 2021-01-20 MED ORDER — ZOSTER VAC RECOMB ADJUVANTED 50 MCG/0.5ML IM SUSR
INTRAMUSCULAR | 0 refills | Status: DC
  Filled 2021-01-20: qty 0.5, 1d supply, fill #0

## 2021-01-21 ENCOUNTER — Other Ambulatory Visit: Payer: Self-pay | Admitting: Family Medicine

## 2021-01-21 DIAGNOSIS — E876 Hypokalemia: Secondary | ICD-10-CM

## 2021-01-22 DIAGNOSIS — J301 Allergic rhinitis due to pollen: Secondary | ICD-10-CM | POA: Diagnosis not present

## 2021-01-22 DIAGNOSIS — J3089 Other allergic rhinitis: Secondary | ICD-10-CM | POA: Diagnosis not present

## 2021-01-28 ENCOUNTER — Encounter: Payer: Self-pay | Admitting: Family Medicine

## 2021-01-29 DIAGNOSIS — J301 Allergic rhinitis due to pollen: Secondary | ICD-10-CM | POA: Diagnosis not present

## 2021-01-29 DIAGNOSIS — J3089 Other allergic rhinitis: Secondary | ICD-10-CM | POA: Diagnosis not present

## 2021-02-03 ENCOUNTER — Ambulatory Visit: Payer: Medicare Other | Attending: Internal Medicine

## 2021-02-03 ENCOUNTER — Other Ambulatory Visit (HOSPITAL_BASED_OUTPATIENT_CLINIC_OR_DEPARTMENT_OTHER): Payer: Self-pay

## 2021-02-03 ENCOUNTER — Other Ambulatory Visit: Payer: Self-pay

## 2021-02-03 ENCOUNTER — Ambulatory Visit (INDEPENDENT_AMBULATORY_CARE_PROVIDER_SITE_OTHER): Payer: Medicare Other | Admitting: Family Medicine

## 2021-02-03 VITALS — BP 166/78 | HR 70 | Wt 160.4 lb

## 2021-02-03 DIAGNOSIS — I10 Essential (primary) hypertension: Secondary | ICD-10-CM | POA: Diagnosis not present

## 2021-02-03 DIAGNOSIS — E0840 Diabetes mellitus due to underlying condition with diabetic neuropathy, unspecified: Secondary | ICD-10-CM

## 2021-02-03 DIAGNOSIS — N898 Other specified noninflammatory disorders of vagina: Secondary | ICD-10-CM

## 2021-02-03 DIAGNOSIS — Z23 Encounter for immunization: Secondary | ICD-10-CM

## 2021-02-03 LAB — POCT GLYCOSYLATED HEMOGLOBIN (HGB A1C): HbA1c, POC (controlled diabetic range): 8.1 % — AB (ref 0.0–7.0)

## 2021-02-03 MED ORDER — PFIZER COVID-19 VAC BIVALENT 30 MCG/0.3ML IM SUSP
INTRAMUSCULAR | 0 refills | Status: DC
  Filled 2021-02-03: qty 0.3, 1d supply, fill #0

## 2021-02-03 NOTE — Progress Notes (Signed)
° ° °  SUBJECTIVE:   CHIEF COMPLAINT / HPI:   Ms. Tal is a 71 yo F who presents for follow up.   Diabetes Current Regimen: Metformin 1000 mg BID,  glipizide 10 mg BID CBGs: 70-120 Last A1c: 8.4 on 10/28/2020  Denies polyuria, polydipsia, hypoglycemia Statin: Atorvastatin 40 mg daily ACE/ARB: Valsartan 20 mg daily  Hypertension - Medications: Atenolol 25 mg daily, hydrochlorothiazide 12.5 mg daily valsartan 20 mg daily - Compliance: Yes - States she thinks her blood pressure is high because she witnessed an accident just prior to this visit.  Otherwise she feels well.   Vaginal irritation Continues to have labial irritation. It is intermittently pruritic. She uses vaseline in the area which helps. She completed treatment for yeast infection and stopped taking jardiance. Her other vaginal symptoms improved but she continues to have labial irritation. She would like to see a gynecologist.   PERTINENT  PMH / [place]: As above.  OBJECTIVE:   BP (!) 166/78    Pulse 70    Wt 160 lb 6.4 oz (72.8 kg)    SpO2 100%    BMI 26.69 kg/m   Physical Exam Constitutional:      General: She is not in acute distress.    Appearance: Normal appearance. She is not ill-appearing or diaphoretic.  Cardiovascular:     Rate and Rhythm: Normal rate and regular rhythm.     Heart sounds: Normal heart sounds.  Pulmonary:     Effort: Pulmonary effort is normal.     Breath sounds: Normal breath sounds.  Musculoskeletal:     Right lower leg: No edema.     Left lower leg: No edema.     Right foot: No swelling, deformity or bunion.     Left foot: No swelling, deformity or bunion.  Skin:    Findings: No rash.  Neurological:     Mental Status: She is alert and oriented to person, place, and time.  Psychiatric:        Mood and Affect: Mood normal.        Behavior: Behavior normal.   ASSESSMENT/PLAN:   1. Diabetes mellitus due to underlying condition, controlled, with diabetic neuropathy, without long-term  current use of insulin (HCC) A1c 8.1 today. Slightly improved from last. Discontinued jardiance 2/2 recurrent yeast infections. CBGs appropriate. Continue current regimen and follow up in 3 months for repeat A1c.   2. Vaginal irritation Chronic. Intermittently pruritic labia. See office visit from 12/05, no clear lesion seen at this time but appeared to have a dry vulvar skin. Recently completed treatment for yeast infection. Will have patient follow up with gynecology. Concern for vulvar cancer if clear lesion on follow up exam (patient is a known smoker), or vulvar lichen sclerosis, or vaginal dryness.  - Ambulatory referral to Gynecology  3. Essential hypertension Elevated today but improved, initially 171/81. Asymptomatic. Endorses medication compliance. Experienced stressful event prior to visit. Plan for BP check in 1 week.    Gerlene Fee, [place]

## 2021-02-03 NOTE — Progress Notes (Signed)
° °  Covid-19 Vaccination Clinic  Name:  [name]    MRN: [id number] DOB: Sep 05, 1950  02/03/2021  [name] was observed post Covid-19 immunization for 15 minutes without incident. She was provided with Vaccine Information Sheet and instruction to access the V-Safe system.   [name] was instructed to call 911 with any severe reactions post vaccine: Difficulty breathing  Swelling of face and throat  A fast heartbeat  A bad rash all over body  Dizziness and weakness   Immunizations Administered     Name Date Dose VIS Date Route   Pfizer Covid-19 Vaccine Bivalent Booster 02/03/2021  3:29 PM 0.3 mL 09/11/2020 Intramuscular   Manufacturer: [place]   Lot: [id number]   [place]: [phone]

## 2021-02-03 NOTE — Patient Instructions (Signed)
Thank you for coming in today.  We discussed a referral to gynecology for continued vaginal irritation.  We also discussed your A1c today which was 8.1.  Continue metformin and glipizide.  We also discussed healthy lifestyle changes to improve A1c.  Your blood pressure was elevated today.  Continue current medications and follow-up in 1 week for BP check.

## 2021-02-11 DIAGNOSIS — J3089 Other allergic rhinitis: Secondary | ICD-10-CM | POA: Diagnosis not present

## 2021-02-11 DIAGNOSIS — J301 Allergic rhinitis due to pollen: Secondary | ICD-10-CM | POA: Diagnosis not present

## 2021-02-13 NOTE — Progress Notes (Signed)
° ° °  SUBJECTIVE:   CHIEF COMPLAINT / HPI:   Vaginal Irritation: Patient is a 71 y.o. female presenting with vaginal irritation that has been present since last summer, when she started taking her Jardiance.  She was seen in December for vaginal pruritis and found to have a yeast infection.  At her last appointment on 1/23 she was referred to GYN, states that her appointment is not until March. PCP was concerned for possible vulvar cancer given she is a smoker, or lichen sclerosis. Patient thought it may have been related to [place] use. States that she only took her Jardiance for 2 weeks and stopped.  States that the lesion is at the top of her labia, feels itchy and irritating.  She is unsure if the areas been getting larger.  She has been using Vaseline intermittently but unsure if she is having any improvement with it.  She has a history of partial hysterectomy in her mid 19s. She is a current 1ppd smoker.  She denies any malodorous or abnormal discharge. She is not interested in screening for sexually transmitted infections today as she reports she is not sexually active.  PERTINENT  PMH / PSH: PTSD, tobacco use disorder, HTN, T2DM   OBJECTIVE:   BP (!) 157/85    Pulse 89    Wt 157 lb 12.8 oz (71.6 kg)    SpO2 100%    BMI 26.26 kg/m    General: NAD, pleasant, able to participate in exam Respiratory: Normal effort, no obvious respiratory distress Pelvic: VULVA: atrophic skin changes to vulva with no masses, tenderness or lesions, VAGINA: Normal appearing vagina with normal color, no lesions, with scant discharge present CERVIX: Not visualized, absent post-hysterectomy  Chaperone Deseree Blount, CMA present for pelvic exam Dr. Gwendlyn Deutscher also present during examination  ASSESSMENT/PLAN:   Atrophic vaginitis Pelvic examination today without any concerning skin findings apart from atrophic vaginal findings.  No abnormal skin lesions that would make me concerned for malignancy at this time.  Last Pap in 2018 without transformation zone present. Speculum exam done today and cervix was not present.  Do still think that patient would benefit from GYN follow-up for second opinion.  Will recommend Estroven over the counter for menopausal symptoms and barrier protection with vaseline or KY jelly as needed. Would like to avoid topical estrogen at this time given side effects that may accompany.    Sharion Settler, [place]

## 2021-02-14 ENCOUNTER — Other Ambulatory Visit: Payer: Self-pay

## 2021-02-14 ENCOUNTER — Ambulatory Visit (INDEPENDENT_AMBULATORY_CARE_PROVIDER_SITE_OTHER): Payer: Medicare Other | Admitting: Family Medicine

## 2021-02-14 ENCOUNTER — Ambulatory Visit: Payer: Medicare Other

## 2021-02-14 DIAGNOSIS — N952 Postmenopausal atrophic vaginitis: Secondary | ICD-10-CM | POA: Diagnosis not present

## 2021-02-14 NOTE — Patient Instructions (Addendum)
It was wonderful to see you today.  Please bring ALL of your medications with you to every visit.   Today we talked about:  -The irritation you feel may be due to changes to your hormonal imbalance that can occur after menopause. -I would recommend that you try Estroven over the counter and continue to use protective barriers such as Vaseline or KY jelly as needed.   -I still think it will be good to follow up with a gynecologist for a second opinion.     Thank you for choosing [facility].   Please call [phone] with any questions about today's appointment.  Please be sure to schedule follow up at the front  desk before you leave today.   Sharion Settler, DO PGY-2 Family Medicine

## 2021-02-14 NOTE — Assessment & Plan Note (Addendum)
Pelvic examination today without any concerning skin findings apart from atrophic vaginal findings.  No abnormal skin lesions that would make me concerned for malignancy at this time. Last Pap in 2018 without transformation zone present. Speculum exam done today and cervix was not present.  Do still think that patient would benefit from GYN follow-up for second opinion.  Will recommend Estroven over the counter for menopausal symptoms and barrier protection with vaseline or KY jelly as needed. Would like to avoid topical estrogen at this time given side effects that may accompany.

## 2021-02-18 ENCOUNTER — Other Ambulatory Visit: Payer: Self-pay | Admitting: Family Medicine

## 2021-02-18 DIAGNOSIS — E876 Hypokalemia: Secondary | ICD-10-CM

## 2021-02-19 DIAGNOSIS — J3089 Other allergic rhinitis: Secondary | ICD-10-CM | POA: Diagnosis not present

## 2021-02-19 DIAGNOSIS — J301 Allergic rhinitis due to pollen: Secondary | ICD-10-CM | POA: Diagnosis not present

## 2021-02-26 ENCOUNTER — Encounter: Payer: Self-pay | Admitting: Podiatry

## 2021-02-26 ENCOUNTER — Other Ambulatory Visit: Payer: Self-pay

## 2021-02-26 ENCOUNTER — Ambulatory Visit: Payer: Medicare Other | Admitting: Podiatry

## 2021-02-26 DIAGNOSIS — M79675 Pain in left toe(s): Secondary | ICD-10-CM

## 2021-02-26 DIAGNOSIS — B351 Tinea unguium: Secondary | ICD-10-CM

## 2021-02-26 DIAGNOSIS — J301 Allergic rhinitis due to pollen: Secondary | ICD-10-CM | POA: Diagnosis not present

## 2021-02-26 DIAGNOSIS — M79674 Pain in right toe(s): Secondary | ICD-10-CM | POA: Diagnosis not present

## 2021-02-26 DIAGNOSIS — E119 Type 2 diabetes mellitus without complications: Secondary | ICD-10-CM

## 2021-02-26 DIAGNOSIS — J3089 Other allergic rhinitis: Secondary | ICD-10-CM | POA: Diagnosis not present

## 2021-03-04 NOTE — Progress Notes (Signed)
°  Subjective:  Patient ID: [name], female    DOB: 02-Nov-1950,  MRN: [id number]  [name] presents to clinic today for preventative diabetic foot care and painful elongated mycotic toenails 1-5 bilaterally which are tender when wearing enclosed shoe gear. Pain is relieved with periodic professional debridement.  Patient states blood glucose was 123 mg/dl today.    New problem(s): None.   PCP is [name], [name], DO , and last visit was February 03, 2021.  Allergies  Allergen Reactions   Alcohol Swabs [Isopropyl Alcohol] Nausea Only    Use peroxide instead   Penicillins Other (See Comments)    Arm swelling   Prednisone Other (See Comments)    Could not sleep   Amoxicillin-Pot Clavulanate Other (See Comments)    She is unsure what reaction she had    Review of Systems: Negative except as noted in the HPI. Objective:   Constitutional [name] is a pleasant 71 y.o. African American female, in NAD. AAO x 3.   Vascular CFT immediate b/l LE. Palpable DP/PT pulses b/l LE. Digital hair absent b/l. Skin temperature gradient WNL b/l. No pain with calf compression b/l. No edema noted b/l. No cyanosis or clubbing noted b/l LE.  Neurologic Normal speech. Oriented to person, place, and time. Pt has subjective symptoms of neuropathy. Protective sensation intact 5/5 intact bilaterally with 10g monofilament b/l. Vibratory sensation intact b/l.  Dermatologic Pedal integument with normal turgor, texture and tone b/l LE. No open wounds b/l. No interdigital macerations b/l. Toenails 1-5 b/l elongated, thickened, discolored with subungual debris. +Tenderness with dorsal palpation of nailplates. No hyperkeratotic or porokeratotic lesions present.  Orthopedic: Normal muscle strength 5/5 to all lower extremity muscle groups bilaterally. HAV with bunion deformity noted b/l LE.Marland Kitchen No pain, crepitus or joint limitation noted with ROM b/l LE.  Patient ambulates independently without assistive  aids.   Radiographs: None  Last A1c:  Hemoglobin A1C Latest Ref Rng & Units 02/03/2021 10/28/2020 08/09/2020 05/09/2020  HGBA1C 0.0 - 7.0 % 8.1(A) 8.4(A) 8.3(A) 7.9(A)  Some recent data might be hidden   Assessment:   1. Pain due to onychomycosis of toenails of both feet   2. Type 2 diabetes mellitus without complication, without long-term current use of insulin ([place])    Plan:  Patient was evaluated and treated and all questions answered. Consent given for treatment as described below: -Toenails 1-5 b/l were debrided in length and girth with sterile nail nippers and dremel without iatrogenic bleeding.  -Patient/POA to call should there be question/concern in the interim.  Return in about 10 weeks (around 05/07/2021).  Marzetta Board, DPM

## 2021-03-05 ENCOUNTER — Other Ambulatory Visit: Payer: Self-pay | Admitting: Family Medicine

## 2021-03-05 DIAGNOSIS — I10 Essential (primary) hypertension: Secondary | ICD-10-CM

## 2021-03-06 DIAGNOSIS — J301 Allergic rhinitis due to pollen: Secondary | ICD-10-CM | POA: Diagnosis not present

## 2021-03-06 DIAGNOSIS — J3089 Other allergic rhinitis: Secondary | ICD-10-CM | POA: Diagnosis not present

## 2021-03-10 DIAGNOSIS — J3089 Other allergic rhinitis: Secondary | ICD-10-CM | POA: Diagnosis not present

## 2021-03-10 DIAGNOSIS — J301 Allergic rhinitis due to pollen: Secondary | ICD-10-CM | POA: Diagnosis not present

## 2021-03-17 ENCOUNTER — Other Ambulatory Visit: Payer: Self-pay | Admitting: Family Medicine

## 2021-03-17 DIAGNOSIS — E876 Hypokalemia: Secondary | ICD-10-CM

## 2021-03-19 ENCOUNTER — Other Ambulatory Visit: Payer: Self-pay | Admitting: Family Medicine

## 2021-03-19 DIAGNOSIS — I10 Essential (primary) hypertension: Secondary | ICD-10-CM

## 2021-03-20 ENCOUNTER — Other Ambulatory Visit (HOSPITAL_BASED_OUTPATIENT_CLINIC_OR_DEPARTMENT_OTHER): Payer: Self-pay

## 2021-03-20 DIAGNOSIS — J301 Allergic rhinitis due to pollen: Secondary | ICD-10-CM | POA: Diagnosis not present

## 2021-03-20 DIAGNOSIS — J3089 Other allergic rhinitis: Secondary | ICD-10-CM | POA: Diagnosis not present

## 2021-03-20 MED ORDER — ZOSTER VAC RECOMB ADJUVANTED 50 MCG/0.5ML IM SUSR
INTRAMUSCULAR | 0 refills | Status: DC
  Filled 2021-03-20: qty 0.5, 1d supply, fill #0

## 2021-03-26 ENCOUNTER — Encounter: Payer: Self-pay | Admitting: Obstetrics

## 2021-03-26 ENCOUNTER — Ambulatory Visit: Payer: Medicare Other | Admitting: Obstetrics

## 2021-03-26 ENCOUNTER — Other Ambulatory Visit: Payer: Self-pay

## 2021-03-26 ENCOUNTER — Other Ambulatory Visit (HOSPITAL_COMMUNITY)
Admission: RE | Admit: 2021-03-26 | Discharge: 2021-03-26 | Disposition: A | Discharge status: Home / Self Care | Payer: Medicare Other | Source: Ambulatory Visit | Attending: Obstetrics | Admitting: Obstetrics

## 2021-03-26 VITALS — BP 157/86 | HR 66 | Ht 65.5 in | Wt 160.6 lb

## 2021-03-26 DIAGNOSIS — J3089 Other allergic rhinitis: Secondary | ICD-10-CM | POA: Diagnosis not present

## 2021-03-26 DIAGNOSIS — F17200 Nicotine dependence, unspecified, uncomplicated: Secondary | ICD-10-CM | POA: Diagnosis not present

## 2021-03-26 DIAGNOSIS — Z01419 Encounter for gynecological examination (general) (routine) without abnormal findings: Secondary | ICD-10-CM

## 2021-03-26 DIAGNOSIS — Z01411 Encounter for gynecological examination (general) (routine) with abnormal findings: Secondary | ICD-10-CM | POA: Diagnosis present

## 2021-03-26 DIAGNOSIS — J301 Allergic rhinitis due to pollen: Secondary | ICD-10-CM | POA: Diagnosis not present

## 2021-03-26 DIAGNOSIS — Z780 Asymptomatic menopausal state: Secondary | ICD-10-CM | POA: Diagnosis not present

## 2021-03-26 NOTE — Progress Notes (Signed)
New GYN, Ref from PCP ?Reports mass on clitoris, denies abnormal vag discharge ?

## 2021-03-26 NOTE — Addendum Note (Signed)
Addended by: Penny Pia on: 03/26/2021 12:02 PM ? ? Modules accepted: Orders ? ?

## 2021-03-26 NOTE — Progress Notes (Signed)
? ?Subjective: ? ? ?  ?  ? [name] is a 72 y.o. female here for a routine exam.  Current complaints: Vaginal discharge and irritation. ? ?Personal health questionnaire:  ?Is patient Ashkenazi Jewish, have a family history of breast and/or ovarian cancer: no ?Is there a family history of uterine cancer diagnosed at age < 62, gastrointestinal cancer, urinary tract cancer, family member who is a Field seismologist syndrome-associated carrier: no ?Is the patient overweight and hypertensive, family history of diabetes, personal history of gestational diabetes, preeclampsia or PCOS: yes ?Is patient over 19, have PCOS,  family history of premature CHD under age 35, diabetes, smoke, have hypertension or peripheral artery disease:  no ?At any time, has a partner hit, kicked or otherwise hurt or frightened you?: no ?Over the past 2 weeks, have you felt down, depressed or hopeless?: no ?Over the past 2 weeks, have you felt little interest or pleasure in doing things?:no ? ? ?Gynecologic History ?No LMP recorded. Patient has had a hysterectomy. ?Contraception: status post hysterectomy ?Last Pap: 1990. Results were: normal ?Last mammogram: 12/2020. Results were: normal ? ?Obstetric History ?OB History  ?Gravida Para Term Preterm AB Living  ?'3 1   1 2 '$ 0  ?SAB IAB Ectopic Multiple Live Births  ?2          ?  ?# Outcome Date GA Lbr Len/2nd Weight Sex Delivery Anes PTL Lv  ?3 SAB           ?2 SAB           ?1 Preterm           ? ? ?Past Medical History:  ?Diagnosis Date  ? Allergy   ? Anxiety   ? AR (allergic rhinitis)   ? Asthma   ? Cataract   ? CHF (congestive heart failure) ([place])   ? Colon polyps   ? hyperplastic  ? Disturbance of skin sensation   ? facial paresthesia; left  ? DM2 (diabetes mellitus, type 2) ([place])   ? Dyslipidemia   ? Fatty liver   ? GERD (gastroesophageal reflux disease)   ? Hemorrhoids   ? Hyperlipidemia   ? Hypertension   ? essential, benign   ? Insomnia   ? Menopausal syndrome   ? Palpitations   ? hx  ? Routine  general medical examination at a health care facility   ? Screening for malignant neoplasm of the cervix   ? Skin lesion   ? SVT (supraventricular tachycardia) ([place])   ? Tobacco abuse   ? Weakness   ?  ?Past Surgical History:  ?Procedure Laterality Date  ? ABDOMINAL HYSTERECTOMY  1990  ? For fibroids benign.  Still has ovaries  ? ablation for SVT  9/09  ? Dr. Lovena Le  ? APPENDECTOMY    ? CATARACT EXTRACTION, BILATERAL    ? CESAREAN SECTION    ? COLONOSCOPY  2009  ? dental extractions    ? HEMORRHOID BANDING  2014  ? w/Dr.Medoff  ? SIGMOIDOSCOPY  2004  ?  ? ?Current Outpatient Medications:  ?  acetaminophen (TYLENOL) 500 MG tablet, Take 500 mg by mouth every 6 (six) hours as needed., Disp: , Rfl:  ?  aspirin 81 MG chewable tablet, Chew 1 tablet (81 mg total) by mouth daily., Disp: 100 tablet, Rfl: 6 ?  atenolol (TENORMIN) 25 MG tablet, TAKE ONE TABLET BY MOUTH DAILY, Disp: 90 tablet, Rfl: 1 ?  atorvastatin (LIPITOR) 40 MG tablet, Take 1 tablet by mouth once  daily, Disp: 90 tablet, Rfl: 0 ?  baclofen (LIORESAL) 10 MG tablet, Take 1 tablet (10 mg total) by mouth daily as needed for muscle spasms., Disp: 30 tablet, Rfl: 1 ?  cetirizine (ZYRTEC) 10 MG tablet, 1 tablet as needed, Disp: , Rfl:  ?  citalopram (CELEXA) 20 MG tablet, TAKE 1 TABLET BY MOUTH EVERY DAY, Disp: 90 tablet, Rfl: 3 ?  clonazePAM (KLONOPIN) 0.5 MG tablet, 1 tablet at bedtime., Disp: , Rfl:  ?  EPINEPHrine 0.3 mg/0.3 mL IJ SOAJ injection, See admin instructions., Disp: , Rfl:  ?  glipiZIDE (GLUCOTROL) 10 MG tablet, TAKE ONE TABLET BY MOUTH TWICE A DAY BEFORE A MEAL Strength: 10 mg, Disp: 60 tablet, Rfl: 0 ?  hydrochlorothiazide (MICROZIDE) 12.5 MG capsule, TAKE 1 CAPSULE BY MOUTH EVERY DAY, Disp: 90 capsule, Rfl: 3 ?  ipratropium (ATROVENT) 0.03 % nasal spray, 1-2 sprays each nostril, Disp: , Rfl:  ?  levalbuterol (XOPENEX HFA) 45 MCG/ACT inhaler, Inhale 2 puffs into the lungs., Disp: , Rfl:  ?  magnesium oxide (MAG-OX) 400 (240 Mg) MG tablet, TAKE ONE  TABLET BY MOUTH DAILY, Disp: 30 tablet, Rfl: 0 ?  meclizine (ANTIVERT) 25 MG tablet, Take 1-2 tablets (25-50 mg total) by mouth 2 (two) times daily as needed for dizziness. if having symptoms.  May repeat in 12 hours later. (Patient taking differently: Take 25-50 mg by mouth 2 (two) times daily as needed for dizziness. if having symptoms.  May repeat in 12 hours later.), Disp: 30 tablet, Rfl: 2 ?  metFORMIN (GLUCOPHAGE) 1000 MG tablet, Take 1 tablet (1,000 mg total) by mouth 2 (two) times daily with a meal., Disp: 180 tablet, Rfl: 3 ?  Olopatadine HCl 0.6 % SOLN, , Disp: , Rfl:  ?  pantoprazole (PROTONIX) 40 MG tablet, TAKE 1 TABLET (40 MG TOTAL) BY MOUTH 2 (TWO) TIMES DAILY. FOR TWO WEEKS, THEN GO BACK TO ONCE DAILY., Disp: 90 tablet, Rfl: 2 ?  Polyethylene Glycol 400 (BLINK TEARS OP), , Disp: , Rfl:  ?  potassium chloride (KLOR-CON) 10 MEQ tablet, TAKE ONE TABLET BY MOUTH DAILY; **MUST CALL MD FOR APPOINTMENT FOR FUTURE REFILLS, Disp: 30 tablet, Rfl: 0 ?  sucralfate (CARAFATE) 1 GM/10ML suspension, Take 10 mLs (1 g total) by mouth 4 (four) times daily -  with meals and at bedtime., Disp: 420 mL, Rfl: 0 ?  traMADol (ULTRAM) 50 MG tablet, TAKE 1 TABLET BY MOUTH THREE TIMES DAILY AS NEEDED FOR LOW BACK PAIN., Disp: 30 tablet, Rfl: 0 ?  Triamcinolone Acetonide (NASACORT AQ NA), , Disp: , Rfl:  ?  valsartan (DIOVAN) 40 MG tablet, Take 0.5 tablets (20 mg total) by mouth daily., Disp: 90 tablet, Rfl: 3 ?  Accu-Chek Softclix Lancets lancets, USE AS DIRECTED 3 TIMES A DAY, Disp: 100 each, Rfl: 5 ?  COVID-19 mRNA bivalent vaccine, Pfizer, (PFIZER COVID-19 VAC BIVALENT) injection, Inject into the muscle., Disp: 0.3 mL, Rfl: 0 ?  empagliflozin (JARDIANCE) 10 MG TABS tablet, Take 1 tablet (10 mg total) by mouth daily., Disp: 90 tablet, Rfl: 3 ?  glucose blood test strip, Use as instructed, Disp: 100 each, Rfl: 12 ?  Zoster Vaccine Adjuvanted [facility]) injection, Inject into the muscle., Disp: 0.5 mL, Rfl: 0 ?Allergies   ?Allergen Reactions  ? Alcohol Swabs [Isopropyl Alcohol] Nausea Only  ?  Use peroxide instead  ? Penicillins Other (See Comments)  ?  Arm swelling  ? Prednisone Other (See Comments)  ?  Could not sleep  ? Amoxicillin-Pot  Clavulanate Other (See Comments)  ?  She is unsure what reaction she had  ?  ?Social History  ? ?Tobacco Use  ? Smoking status: Every Day  ?  Packs/day: 1.00  ?  Types: Cigarettes  ? Smokeless tobacco: Never  ?Substance Use Topics  ? Alcohol use: Not Currently  ?  ?Family History  ?Problem Relation Age of Onset  ? Heart attack Father 31  ? Hypertension Mother   ? Stroke Mother   ? Diabetes Mother   ? Stomach cancer Mother   ? Cancer Mother   ? Diabetes Brother   ? Coronary artery disease Other   ?     female, 1st degree relative <50  ? Stomach cancer Maternal Aunt   ? Diabetes Brother   ? Colon cancer Neg Hx   ? Esophageal cancer Neg Hx   ?  ? ? ?Review of Systems ? ?Constitutional: negative for fatigue and weight loss ?Respiratory: negative for cough and wheezing ?Cardiovascular: negative for chest pain, fatigue and palpitations ?Gastrointestinal: negative for abdominal pain and change in bowel habits ?Musculoskeletal:negative for myalgias ?Neurological: negative for gait problems and tremors ?Behavioral/Psych: negative for abusive relationship, depression ?Endocrine: negative for temperature intolerance    ?Genitourinary:negative for abnormal menstrual periods, genital lesions, hot flashes, sexual problems and vaginal discharge ?Integument/breast: negative for breast lump, breast tenderness, nipple discharge and skin lesion(s) ? ?  ?Objective:  ? ?    ?BP (!) 157/86   Pulse 66   Ht 5' 5.5" (1.664 m)   Wt 160 lb 9.6 oz (72.8 kg)   BMI 26.32 kg/m?  ?General:   alert  ?Skin:   no rash or abnormalities  ?Lungs:   clear to auscultation bilaterally  ?Heart:   regular rate and rhythm, S1, S2 normal, no murmur, click, rub or gallop  ?Breasts:   normal without suspicious masses, skin or nipple changes  or axillary nodes  ?Abdomen:  normal findings: no organomegaly, soft, non-tender and no hernia  ?Pelvis:  External genitalia: normal general appearance ?Urinary system: urethral meatus normal and bladder without

## 2021-03-27 LAB — CERVICOVAGINAL ANCILLARY ONLY
Bacterial Vaginitis (gardnerella): NEGATIVE
Candida Glabrata: NEGATIVE
Candida Vaginitis: NEGATIVE
Chlamydia: NEGATIVE
Comment: NEGATIVE
Comment: NEGATIVE
Comment: NEGATIVE
Comment: NEGATIVE
Comment: NEGATIVE
Comment: NORMAL
Neisseria Gonorrhea: NEGATIVE
Trichomonas: NEGATIVE

## 2021-03-31 DIAGNOSIS — J3089 Other allergic rhinitis: Secondary | ICD-10-CM | POA: Diagnosis not present

## 2021-03-31 DIAGNOSIS — J301 Allergic rhinitis due to pollen: Secondary | ICD-10-CM | POA: Diagnosis not present

## 2021-04-11 DIAGNOSIS — J301 Allergic rhinitis due to pollen: Secondary | ICD-10-CM | POA: Diagnosis not present

## 2021-04-11 DIAGNOSIS — J3089 Other allergic rhinitis: Secondary | ICD-10-CM | POA: Diagnosis not present

## 2021-04-14 DIAGNOSIS — J3089 Other allergic rhinitis: Secondary | ICD-10-CM | POA: Diagnosis not present

## 2021-04-14 DIAGNOSIS — J301 Allergic rhinitis due to pollen: Secondary | ICD-10-CM | POA: Diagnosis not present

## 2021-04-15 ENCOUNTER — Other Ambulatory Visit: Payer: Self-pay | Admitting: Family Medicine

## 2021-04-15 DIAGNOSIS — E876 Hypokalemia: Secondary | ICD-10-CM

## 2021-04-17 ENCOUNTER — Other Ambulatory Visit: Payer: Self-pay | Admitting: Family Medicine

## 2021-04-17 DIAGNOSIS — E0840 Diabetes mellitus due to underlying condition with diabetic neuropathy, unspecified: Secondary | ICD-10-CM

## 2021-04-23 DIAGNOSIS — J301 Allergic rhinitis due to pollen: Secondary | ICD-10-CM | POA: Diagnosis not present

## 2021-04-23 DIAGNOSIS — J3089 Other allergic rhinitis: Secondary | ICD-10-CM | POA: Diagnosis not present

## 2021-04-24 DIAGNOSIS — J3089 Other allergic rhinitis: Secondary | ICD-10-CM | POA: Diagnosis not present

## 2021-04-24 DIAGNOSIS — J301 Allergic rhinitis due to pollen: Secondary | ICD-10-CM | POA: Diagnosis not present

## 2021-04-29 ENCOUNTER — Other Ambulatory Visit (HOSPITAL_BASED_OUTPATIENT_CLINIC_OR_DEPARTMENT_OTHER): Payer: Self-pay

## 2021-04-29 DIAGNOSIS — J3081 Allergic rhinitis due to animal (cat) (dog) hair and dander: Secondary | ICD-10-CM | POA: Diagnosis not present

## 2021-04-29 DIAGNOSIS — J301 Allergic rhinitis due to pollen: Secondary | ICD-10-CM | POA: Diagnosis not present

## 2021-04-29 DIAGNOSIS — J3089 Other allergic rhinitis: Secondary | ICD-10-CM | POA: Diagnosis not present

## 2021-05-05 DIAGNOSIS — J301 Allergic rhinitis due to pollen: Secondary | ICD-10-CM | POA: Diagnosis not present

## 2021-05-05 DIAGNOSIS — J3089 Other allergic rhinitis: Secondary | ICD-10-CM | POA: Diagnosis not present

## 2021-05-05 DIAGNOSIS — J3081 Allergic rhinitis due to animal (cat) (dog) hair and dander: Secondary | ICD-10-CM | POA: Diagnosis not present

## 2021-05-07 ENCOUNTER — Other Ambulatory Visit: Payer: Self-pay | Admitting: Family Medicine

## 2021-05-07 DIAGNOSIS — E0840 Diabetes mellitus due to underlying condition with diabetic neuropathy, unspecified: Secondary | ICD-10-CM

## 2021-05-08 DIAGNOSIS — J301 Allergic rhinitis due to pollen: Secondary | ICD-10-CM | POA: Diagnosis not present

## 2021-05-08 DIAGNOSIS — J3089 Other allergic rhinitis: Secondary | ICD-10-CM | POA: Diagnosis not present

## 2021-05-09 ENCOUNTER — Ambulatory Visit: Payer: Medicare Other | Admitting: Podiatry

## 2021-05-09 ENCOUNTER — Encounter: Payer: Self-pay | Admitting: Family Medicine

## 2021-05-09 ENCOUNTER — Ambulatory Visit (INDEPENDENT_AMBULATORY_CARE_PROVIDER_SITE_OTHER): Payer: Medicare Other | Admitting: Family Medicine

## 2021-05-09 VITALS — BP 121/80 | HR 86 | Ht 65.5 in | Wt 158.4 lb

## 2021-05-09 DIAGNOSIS — I10 Essential (primary) hypertension: Secondary | ICD-10-CM

## 2021-05-09 DIAGNOSIS — F17200 Nicotine dependence, unspecified, uncomplicated: Secondary | ICD-10-CM | POA: Diagnosis not present

## 2021-05-09 DIAGNOSIS — E0840 Diabetes mellitus due to underlying condition with diabetic neuropathy, unspecified: Secondary | ICD-10-CM

## 2021-05-09 LAB — POCT GLYCOSYLATED HEMOGLOBIN (HGB A1C): HbA1c, POC (controlled diabetic range): 7.6 % — AB (ref 0.0–7.0)

## 2021-05-09 NOTE — Progress Notes (Signed)
? ? ?  SUBJECTIVE:  ? ?CHIEF COMPLAINT / HPI:  ? ?Diabetes ?Current Regimen: Metformin 1000 mg BID,  glipizide 10 mg BID ?CBGs: 70-120 ?Last A1c: 8.1 on 02/03/2021  ?Denies polyuria, polydipsia, hypoglycemia ?Statin: Atorvastatin 40 mg daily ?ACE/ARB: Valsartan 20 mg daily ? ?Hypertension ?- Medications: Atenolol 25 mg daily, hydrochlorothiazide 12.5 mg daily valsartan 20 mg daily ?- Compliance: Yes ?- Denies any SOB, CP, vision changes, LE edema, medication SEs, or symptoms of hypotension ? ?Tobacco use disorder ?Daily. Contemplating quitting. Admits she is not yet ready to quit. "Maybe after her birthday."  ? ?PERTINENT  PMH / PSH: As above.  ? ?OBJECTIVE:  ? ?BP 121/80   Pulse 86   Ht 5' 5.5" (1.664 m)   Wt 158 lb 6.4 oz (71.8 kg)   SpO2 97%   BMI 25.96 kg/m?   ?General: Appears well, no acute distress. Age appropriate. ?Cardiac: RRR, normal heart sounds, no murmurs ?Respiratory: CTAB, normal effort ?Extremities: No edema or cyanosis. ?Skin: Warm and dry, no rashes noted ?Neuro: alert and oriented, no focal deficits ?Psych: normal affect ? ?ASSESSMENT/PLAN:  ? ?1. Diabetes mellitus due to underlying condition, controlled, with diabetic neuropathy, without long-term current use of insulin ([place]) ?Improved to 7.6. Continue current medications.  ?- HgB A1c ? ?2. Essential hypertension ?At goal. Continue current medications.  ? ?3. Tobacco use disorder ?Discussed in prior visits. Continues to be in the contemplation phase. Will follow up at next visit. Resources given prior.  ? ? ?Krystale Rinkenberger [name], DO ?[place]  ?

## 2021-05-09 NOTE — Patient Instructions (Signed)
It was wonderful to see you today. ? ?Please bring ALL of your medications with you to every visit.  ? ?Today we talked about: ? ?Continuing your current medications for your diabetes and blood pressure. Follow up in 3 months for next A1c or sooner if needed.  ? ?Please be sure to schedule follow up at the front  desk before you leave today.  ? ?If you haven't already, sign up for My Chart to have easy access to your labs results, and communication with your primary care physician. ? ?Please call the clinic at [phone] if your symptoms worsen or you have any concerns. It was our pleasure to serve you. ? ?Dr. Janus Molder ? ?

## 2021-05-12 ENCOUNTER — Ambulatory Visit: Payer: Medicare Other | Admitting: Podiatry

## 2021-05-12 ENCOUNTER — Encounter: Payer: Self-pay | Admitting: Podiatry

## 2021-05-12 DIAGNOSIS — M79674 Pain in right toe(s): Secondary | ICD-10-CM | POA: Diagnosis not present

## 2021-05-12 DIAGNOSIS — M79675 Pain in left toe(s): Secondary | ICD-10-CM

## 2021-05-12 DIAGNOSIS — B351 Tinea unguium: Secondary | ICD-10-CM

## 2021-05-12 DIAGNOSIS — E1140 Type 2 diabetes mellitus with diabetic neuropathy, unspecified: Secondary | ICD-10-CM | POA: Diagnosis not present

## 2021-05-15 ENCOUNTER — Other Ambulatory Visit: Payer: Self-pay | Admitting: Family Medicine

## 2021-05-15 DIAGNOSIS — E876 Hypokalemia: Secondary | ICD-10-CM

## 2021-05-15 DIAGNOSIS — J3089 Other allergic rhinitis: Secondary | ICD-10-CM | POA: Diagnosis not present

## 2021-05-15 DIAGNOSIS — J301 Allergic rhinitis due to pollen: Secondary | ICD-10-CM | POA: Diagnosis not present

## 2021-05-15 DIAGNOSIS — E0840 Diabetes mellitus due to underlying condition with diabetic neuropathy, unspecified: Secondary | ICD-10-CM

## 2021-05-18 NOTE — Progress Notes (Signed)
?  Subjective:  ?Patient ID: [name], female    DOB: 1950-05-05,  MRN: [id number] ? ?[name] presents to clinic today for preventative diabetic foot care and painful thick toenails that are difficult to trim. Pain interferes with ambulation. Aggravating factors include wearing enclosed shoe gear. Pain is relieved with periodic professional debridement. ? ?Patient states blood glucose was 123 mg/dl today.  Last known HgA1c was 7.4%. ? ?Patient states great toes feel a little sore today. Denies any redness, drainage or swelling. ? ?PCP is [name], [name], DO , and last visit was May 09, 2021. ? ?Allergies  ?Allergen Reactions  ? Alcohol Swabs [Isopropyl Alcohol] Nausea Only  ?  Use peroxide instead  ? Penicillins Other (See Comments)  ?  Arm swelling  ? Prednisone Other (See Comments)  ?  Could not sleep  ? Amoxicillin-Pot Clavulanate Other (See Comments)  ?  She is unsure what reaction she had  ? ? ?Review of Systems: Negative except as noted in the HPI. ? ?Objective:  ?Constitutional [name] is a pleasant 71 y.o. African American female, in NAD. AAO x 3.   ?Vascular CFT immediate b/l LE. Palpable DP/PT pulses b/l LE. Digital hair absent b/l. Skin temperature gradient WNL b/l. No pain with calf compression b/l. No edema noted b/l. No cyanosis or clubbing noted b/l LE.  ?Neurologic Normal speech. Oriented to person, place, and time. Pt has subjective symptoms of neuropathy. Protective sensation intact 5/5 intact bilaterally with 10g monofilament b/l. Vibratory sensation intact b/l.  ?Dermatologic Pedal integument with normal turgor, texture and tone b/l LE. No open wounds b/l. No interdigital macerations b/l. Toenails 1-5 b/l elongated, thickened, discolored with subungual debris. +Tenderness with dorsal palpation of nailplates. No hyperkeratotic or porokeratotic lesions present.Incurvated nailplate both borders of left hallux and both borders of right hallux with tenderness to palpation.  No erythema, no edema, no drainage noted. No fluctuance.   ?Orthopedic: Normal muscle strength 5/5 to all lower extremity muscle groups bilaterally. HAV with bunion deformity noted b/l LE.Marland Kitchen No pain, crepitus or joint limitation noted with ROM b/l LE.  Patient ambulates independently without assistive aids.  ? ?Radiographs: None ? ?  Latest Ref Rng & Units 05/09/2021  ?  1:32 PM 02/03/2021  ?  1:39 PM 10/28/2020  ?  3:19 PM 08/09/2020  ?  8:45 AM  ?Hemoglobin A1C  ?Hemoglobin-A1c 0.0 - 7.0 % 7.6   8.1   8.4   8.3    ? ? ?Assessment/Plan: ?1. Pain due to onychomycosis of toenails of both feet   ?2. Type 2 diabetes, controlled, with neuropathy ([place])   ?  ?-Patient was evaluated and treated. All patient's and/or POA's questions/concerns answered on today's visit. ?-Continue diabetic foot care principles: inspect feet daily, monitor glucose as recommended by PCP and/or Endocrinologist, and follow prescribed diet per PCP, Endocrinologist and/or dietician. ?-Mycotic toenails 2-5 bilaterally were debrided in length and girth with sterile nail nippers and dremel without iatrogenic bleeding. ?-Offending nail border debrided and curretaged bilateral great toes utilizing sterile nail nipper and currette. Border cleansed with alcohol and triple antibiotic applied. No further treatment required by patient/caregiver. ?-Patient/POA to call should there be question/concern in the interim.  ? ?Return in about 3 months (around 08/12/2021). ? ?Marzetta Board, DPM  ?

## 2021-05-21 DIAGNOSIS — J301 Allergic rhinitis due to pollen: Secondary | ICD-10-CM | POA: Diagnosis not present

## 2021-05-21 DIAGNOSIS — J3081 Allergic rhinitis due to animal (cat) (dog) hair and dander: Secondary | ICD-10-CM | POA: Diagnosis not present

## 2021-05-21 DIAGNOSIS — J3089 Other allergic rhinitis: Secondary | ICD-10-CM | POA: Diagnosis not present

## 2021-05-29 DIAGNOSIS — J3089 Other allergic rhinitis: Secondary | ICD-10-CM | POA: Diagnosis not present

## 2021-05-29 DIAGNOSIS — J301 Allergic rhinitis due to pollen: Secondary | ICD-10-CM | POA: Diagnosis not present

## 2021-05-30 ENCOUNTER — Other Ambulatory Visit (HOSPITAL_BASED_OUTPATIENT_CLINIC_OR_DEPARTMENT_OTHER): Payer: Self-pay

## 2021-06-04 DIAGNOSIS — J301 Allergic rhinitis due to pollen: Secondary | ICD-10-CM | POA: Diagnosis not present

## 2021-06-04 DIAGNOSIS — J3089 Other allergic rhinitis: Secondary | ICD-10-CM | POA: Diagnosis not present

## 2021-06-04 DIAGNOSIS — J3081 Allergic rhinitis due to animal (cat) (dog) hair and dander: Secondary | ICD-10-CM | POA: Diagnosis not present

## 2021-06-05 ENCOUNTER — Other Ambulatory Visit: Payer: Self-pay | Admitting: Family Medicine

## 2021-06-05 DIAGNOSIS — I10 Essential (primary) hypertension: Secondary | ICD-10-CM

## 2021-06-10 ENCOUNTER — Other Ambulatory Visit (HOSPITAL_BASED_OUTPATIENT_CLINIC_OR_DEPARTMENT_OTHER): Payer: Self-pay

## 2021-06-13 DIAGNOSIS — J3089 Other allergic rhinitis: Secondary | ICD-10-CM | POA: Diagnosis not present

## 2021-06-13 DIAGNOSIS — J301 Allergic rhinitis due to pollen: Secondary | ICD-10-CM | POA: Diagnosis not present

## 2021-06-14 ENCOUNTER — Other Ambulatory Visit: Payer: Self-pay | Admitting: Family Medicine

## 2021-06-14 DIAGNOSIS — E0840 Diabetes mellitus due to underlying condition with diabetic neuropathy, unspecified: Secondary | ICD-10-CM

## 2021-06-14 DIAGNOSIS — E876 Hypokalemia: Secondary | ICD-10-CM

## 2021-06-19 DIAGNOSIS — J301 Allergic rhinitis due to pollen: Secondary | ICD-10-CM | POA: Diagnosis not present

## 2021-06-19 DIAGNOSIS — J3089 Other allergic rhinitis: Secondary | ICD-10-CM | POA: Diagnosis not present

## 2021-06-26 DIAGNOSIS — J3089 Other allergic rhinitis: Secondary | ICD-10-CM | POA: Diagnosis not present

## 2021-06-26 DIAGNOSIS — J301 Allergic rhinitis due to pollen: Secondary | ICD-10-CM | POA: Diagnosis not present

## 2021-06-26 DIAGNOSIS — J3081 Allergic rhinitis due to animal (cat) (dog) hair and dander: Secondary | ICD-10-CM | POA: Diagnosis not present

## 2021-07-02 DIAGNOSIS — J3089 Other allergic rhinitis: Secondary | ICD-10-CM | POA: Diagnosis not present

## 2021-07-02 DIAGNOSIS — J3081 Allergic rhinitis due to animal (cat) (dog) hair and dander: Secondary | ICD-10-CM | POA: Diagnosis not present

## 2021-07-02 DIAGNOSIS — J301 Allergic rhinitis due to pollen: Secondary | ICD-10-CM | POA: Diagnosis not present

## 2021-07-07 ENCOUNTER — Other Ambulatory Visit (HOSPITAL_BASED_OUTPATIENT_CLINIC_OR_DEPARTMENT_OTHER): Payer: Self-pay

## 2021-07-07 DIAGNOSIS — J3081 Allergic rhinitis due to animal (cat) (dog) hair and dander: Secondary | ICD-10-CM | POA: Diagnosis not present

## 2021-07-07 DIAGNOSIS — J301 Allergic rhinitis due to pollen: Secondary | ICD-10-CM | POA: Diagnosis not present

## 2021-07-07 DIAGNOSIS — J3089 Other allergic rhinitis: Secondary | ICD-10-CM | POA: Diagnosis not present

## 2021-07-07 MED ORDER — EPINEPHRINE 0.3 MG/0.3ML IJ SOAJ
INTRAMUSCULAR | 1 refills | Status: AC
  Filled 2021-07-07 (×3): qty 2, 15d supply, fill #0

## 2021-07-13 ENCOUNTER — Other Ambulatory Visit: Payer: Self-pay | Admitting: Family Medicine

## 2021-07-13 DIAGNOSIS — E0840 Diabetes mellitus due to underlying condition with diabetic neuropathy, unspecified: Secondary | ICD-10-CM

## 2021-07-13 DIAGNOSIS — E876 Hypokalemia: Secondary | ICD-10-CM

## 2021-07-18 DIAGNOSIS — J3081 Allergic rhinitis due to animal (cat) (dog) hair and dander: Secondary | ICD-10-CM | POA: Diagnosis not present

## 2021-07-18 DIAGNOSIS — J301 Allergic rhinitis due to pollen: Secondary | ICD-10-CM | POA: Diagnosis not present

## 2021-07-18 DIAGNOSIS — J3089 Other allergic rhinitis: Secondary | ICD-10-CM | POA: Diagnosis not present

## 2021-07-25 DIAGNOSIS — J3081 Allergic rhinitis due to animal (cat) (dog) hair and dander: Secondary | ICD-10-CM | POA: Diagnosis not present

## 2021-07-25 DIAGNOSIS — J301 Allergic rhinitis due to pollen: Secondary | ICD-10-CM | POA: Diagnosis not present

## 2021-07-25 DIAGNOSIS — J3089 Other allergic rhinitis: Secondary | ICD-10-CM | POA: Diagnosis not present

## 2021-07-28 DIAGNOSIS — J3081 Allergic rhinitis due to animal (cat) (dog) hair and dander: Secondary | ICD-10-CM | POA: Diagnosis not present

## 2021-07-28 DIAGNOSIS — J301 Allergic rhinitis due to pollen: Secondary | ICD-10-CM | POA: Diagnosis not present

## 2021-07-28 DIAGNOSIS — J3089 Other allergic rhinitis: Secondary | ICD-10-CM | POA: Diagnosis not present

## 2021-08-04 DIAGNOSIS — J301 Allergic rhinitis due to pollen: Secondary | ICD-10-CM | POA: Diagnosis not present

## 2021-08-04 DIAGNOSIS — J3089 Other allergic rhinitis: Secondary | ICD-10-CM | POA: Diagnosis not present

## 2021-08-04 DIAGNOSIS — J3081 Allergic rhinitis due to animal (cat) (dog) hair and dander: Secondary | ICD-10-CM | POA: Diagnosis not present

## 2021-08-04 NOTE — Patient Instructions (Incomplete)
It was great to see you! Thank you for allowing me to participate in your care!   I recommend that you always bring your medications to each appointment as this makes it easy to ensure we are on the correct medications and helps [place] not miss when refills are needed.  Our plans for today:  - Your A1c was 8.2, we will monitor this and if worsening we can consider adding a different medication that helps with both diabetes and weight loss - We will follow up in 3 months for A1c and also physical - I have included quit line tobacco card to this, please let us know if you become interested in trying medications for this   Take care and seek immediate care sooner if you develop any concerns. Please remember to show up 15 minutes before your scheduled appointment time!  Gerrit Heck, MD [place]

## 2021-08-04 NOTE — Progress Notes (Unsigned)
    SUBJECTIVE:   CHIEF COMPLAINT / HPI:   Diabetic Follow Up: Patient is a 71 y.o. female who present today for diabetic follow up.   Patient endorses {[id number]:24882}  Home medications include: metformin 1000 mg BID, glipizide 10 mg BID, jardiance 10 mg daily, atorvastatin 40 mg daily, valsartan 20 mg daily Patient endorses taking these medications as prescribed.***  Most recent A1Cs:  Lab Results  Component Value Date   HGBA1C 7.6 (A) 05/09/2021   HGBA1C 8.1 (A) 02/03/2021   HGBA1C 8.4 (A) 10/28/2020   Last Microalbumin, LDL, Creatinine: Lab Results  Component Value Date   LDLCALC 48 05/09/2020   CREATININE 0.70 12/16/2020   Patient {rwdoesdoesnot:24881} check blood glucose on a regular basis.  Patient {rwisisnot:24883} up to date on diabetic eye. Patient {rwisisnot:24883} up to date on diabetic foot exam.  PERTINENT  PMH / PSH: ***  OBJECTIVE:   There were no vitals taken for this visit.  ***  ASSESSMENT/PLAN:   No problem-specific Assessment & Plan notes found for this encounter.     Gerrit Heck, MD [place]

## 2021-08-05 ENCOUNTER — Ambulatory Visit (INDEPENDENT_AMBULATORY_CARE_PROVIDER_SITE_OTHER): Payer: Medicare Other | Admitting: Student

## 2021-08-05 ENCOUNTER — Encounter: Payer: Self-pay | Admitting: Student

## 2021-08-05 ENCOUNTER — Other Ambulatory Visit: Payer: Self-pay | Admitting: Family Medicine

## 2021-08-05 VITALS — BP 128/70 | HR 81 | Ht 65.5 in | Wt 160.6 lb

## 2021-08-05 DIAGNOSIS — I10 Essential (primary) hypertension: Secondary | ICD-10-CM

## 2021-08-05 DIAGNOSIS — L723 Sebaceous cyst: Secondary | ICD-10-CM

## 2021-08-05 DIAGNOSIS — E0840 Diabetes mellitus due to underlying condition with diabetic neuropathy, unspecified: Secondary | ICD-10-CM

## 2021-08-05 DIAGNOSIS — F17200 Nicotine dependence, unspecified, uncomplicated: Secondary | ICD-10-CM

## 2021-08-05 LAB — POCT GLYCOSYLATED HEMOGLOBIN (HGB A1C): HbA1c, POC (controlled diabetic range): 8.2 % — AB (ref 0.0–7.0)

## 2021-08-05 NOTE — Assessment & Plan Note (Signed)
Stable today, continue valsartan 40 mg daily, hydrochlorothiazide 12.5 mg daily, atenolol 25 mg daily -We will need BMP at next visit

## 2021-08-05 NOTE — Assessment & Plan Note (Signed)
In contemplative phase.  Provided quit line number and card on AVS.  Counseled today discussed options such as Chantix.

## 2021-08-05 NOTE — Assessment & Plan Note (Signed)
A1c 8.2 today -Continue metformin 1000 mg twice daily -Continue glipizide 10 mg twice daily -Continue statin and ARB -Could not tolerate Jardiance in the past, consider GLP-1 agonist on next visit if A1c worsening or same -Return in 3 months for repeat A1c

## 2021-08-05 NOTE — Assessment & Plan Note (Signed)
1 to 2 cm cyst present on left shoulder.  Small cyst present near the sternum. Present for years on shoulder per patient.  She says it mainly drains.  Does not appear infected today. -Consider dermatology clinic referral in future for punch biopsy/excision cyst sac

## 2021-08-06 ENCOUNTER — Ambulatory Visit: Payer: Medicare Other | Admitting: Podiatry

## 2021-08-14 DIAGNOSIS — J3089 Other allergic rhinitis: Secondary | ICD-10-CM | POA: Diagnosis not present

## 2021-08-14 DIAGNOSIS — J301 Allergic rhinitis due to pollen: Secondary | ICD-10-CM | POA: Diagnosis not present

## 2021-08-20 ENCOUNTER — Other Ambulatory Visit: Payer: Self-pay | Admitting: Family Medicine

## 2021-08-20 DIAGNOSIS — F411 Generalized anxiety disorder: Secondary | ICD-10-CM

## 2021-08-21 ENCOUNTER — Other Ambulatory Visit: Payer: Self-pay

## 2021-08-21 DIAGNOSIS — E0840 Diabetes mellitus due to underlying condition with diabetic neuropathy, unspecified: Secondary | ICD-10-CM

## 2021-08-21 DIAGNOSIS — J3089 Other allergic rhinitis: Secondary | ICD-10-CM | POA: Diagnosis not present

## 2021-08-21 DIAGNOSIS — J3081 Allergic rhinitis due to animal (cat) (dog) hair and dander: Secondary | ICD-10-CM | POA: Diagnosis not present

## 2021-08-21 DIAGNOSIS — J301 Allergic rhinitis due to pollen: Secondary | ICD-10-CM | POA: Diagnosis not present

## 2021-08-22 MED ORDER — GLIPIZIDE 10 MG PO TABS: ORAL_TABLET | ORAL | 0 refills | Status: DC

## 2021-08-26 ENCOUNTER — Other Ambulatory Visit: Payer: Self-pay

## 2021-08-26 DIAGNOSIS — E876 Hypokalemia: Secondary | ICD-10-CM

## 2021-08-28 DIAGNOSIS — J3081 Allergic rhinitis due to animal (cat) (dog) hair and dander: Secondary | ICD-10-CM | POA: Diagnosis not present

## 2021-08-28 DIAGNOSIS — J3089 Other allergic rhinitis: Secondary | ICD-10-CM | POA: Diagnosis not present

## 2021-08-28 DIAGNOSIS — J301 Allergic rhinitis due to pollen: Secondary | ICD-10-CM | POA: Diagnosis not present

## 2021-08-28 MED ORDER — POTASSIUM CHLORIDE ER 10 MEQ PO TBCR: 10.0000 meq | EXTENDED_RELEASE_TABLET | Freq: Every day | ORAL | 0 refills | Status: DC

## 2021-09-03 DIAGNOSIS — J3089 Other allergic rhinitis: Secondary | ICD-10-CM | POA: Diagnosis not present

## 2021-09-03 DIAGNOSIS — J301 Allergic rhinitis due to pollen: Secondary | ICD-10-CM | POA: Diagnosis not present

## 2021-09-04 ENCOUNTER — Other Ambulatory Visit: Payer: Self-pay

## 2021-09-04 DIAGNOSIS — I10 Essential (primary) hypertension: Secondary | ICD-10-CM

## 2021-09-04 MED ORDER — ATORVASTATIN CALCIUM 40 MG PO TABS: 40.0000 mg | ORAL_TABLET | Freq: Every day | ORAL | 0 refills | Status: DC

## 2021-09-10 ENCOUNTER — Other Ambulatory Visit: Payer: Self-pay | Admitting: Family Medicine

## 2021-09-10 DIAGNOSIS — K219 Gastro-esophageal reflux disease without esophagitis: Secondary | ICD-10-CM

## 2021-09-10 DIAGNOSIS — J3089 Other allergic rhinitis: Secondary | ICD-10-CM | POA: Diagnosis not present

## 2021-09-10 DIAGNOSIS — J301 Allergic rhinitis due to pollen: Secondary | ICD-10-CM | POA: Diagnosis not present

## 2021-09-11 DIAGNOSIS — J301 Allergic rhinitis due to pollen: Secondary | ICD-10-CM | POA: Diagnosis not present

## 2021-09-11 DIAGNOSIS — J3089 Other allergic rhinitis: Secondary | ICD-10-CM | POA: Diagnosis not present

## 2021-09-12 ENCOUNTER — Other Ambulatory Visit: Payer: Self-pay | Admitting: Family Medicine

## 2021-09-12 DIAGNOSIS — I10 Essential (primary) hypertension: Secondary | ICD-10-CM

## 2021-09-17 DIAGNOSIS — J301 Allergic rhinitis due to pollen: Secondary | ICD-10-CM | POA: Diagnosis not present

## 2021-09-17 DIAGNOSIS — K219 Gastro-esophageal reflux disease without esophagitis: Secondary | ICD-10-CM | POA: Diagnosis not present

## 2021-09-17 DIAGNOSIS — J4520 Mild intermittent asthma, uncomplicated: Secondary | ICD-10-CM | POA: Diagnosis not present

## 2021-09-17 DIAGNOSIS — H1045 Other chronic allergic conjunctivitis: Secondary | ICD-10-CM | POA: Diagnosis not present

## 2021-09-17 DIAGNOSIS — J3089 Other allergic rhinitis: Secondary | ICD-10-CM | POA: Diagnosis not present

## 2021-09-18 ENCOUNTER — Other Ambulatory Visit (HOSPITAL_BASED_OUTPATIENT_CLINIC_OR_DEPARTMENT_OTHER): Payer: Self-pay

## 2021-09-18 MED ORDER — EPINEPHRINE 0.3 MG/0.3ML IJ SOAJ
INTRAMUSCULAR | 1 refills | Status: DC
  Filled 2021-09-18: qty 2, 15d supply, fill #0

## 2021-09-19 ENCOUNTER — Other Ambulatory Visit: Payer: Self-pay | Admitting: Student

## 2021-09-19 DIAGNOSIS — E0840 Diabetes mellitus due to underlying condition with diabetic neuropathy, unspecified: Secondary | ICD-10-CM

## 2021-09-22 ENCOUNTER — Other Ambulatory Visit: Payer: Self-pay | Admitting: Student

## 2021-09-22 DIAGNOSIS — E876 Hypokalemia: Secondary | ICD-10-CM

## 2021-09-24 ENCOUNTER — Ambulatory Visit: Payer: Medicare Other | Admitting: Podiatry

## 2021-09-24 DIAGNOSIS — M79674 Pain in right toe(s): Secondary | ICD-10-CM | POA: Diagnosis not present

## 2021-09-24 DIAGNOSIS — B351 Tinea unguium: Secondary | ICD-10-CM

## 2021-09-24 DIAGNOSIS — M79675 Pain in left toe(s): Secondary | ICD-10-CM

## 2021-09-24 DIAGNOSIS — J301 Allergic rhinitis due to pollen: Secondary | ICD-10-CM | POA: Diagnosis not present

## 2021-09-24 DIAGNOSIS — M2042 Other hammer toe(s) (acquired), left foot: Secondary | ICD-10-CM | POA: Diagnosis not present

## 2021-09-24 DIAGNOSIS — E119 Type 2 diabetes mellitus without complications: Secondary | ICD-10-CM | POA: Diagnosis not present

## 2021-09-24 DIAGNOSIS — J3089 Other allergic rhinitis: Secondary | ICD-10-CM | POA: Diagnosis not present

## 2021-09-24 DIAGNOSIS — E1142 Type 2 diabetes mellitus with diabetic polyneuropathy: Secondary | ICD-10-CM

## 2021-09-24 DIAGNOSIS — M2041 Other hammer toe(s) (acquired), right foot: Secondary | ICD-10-CM

## 2021-09-24 DIAGNOSIS — J3081 Allergic rhinitis due to animal (cat) (dog) hair and dander: Secondary | ICD-10-CM | POA: Diagnosis not present

## 2021-09-28 ENCOUNTER — Encounter: Payer: Self-pay | Admitting: Podiatry

## 2021-09-28 NOTE — Progress Notes (Signed)
ANNUAL DIABETIC FOOT EXAM  Subjective: [name] presents today for annual diabetic foot examination.  Patient confirms h/o diabetes.  Patient relates 14 year h/o diabetes.  Patient denies any h/o foot wounds.  Patient admits symptoms of foot numbness and tingling.  Patient's blood sugar was 145 mg/dl today. Last known  HgA1c was about 8%   Risk factors: diabetes, diabetic neuropathy, CHF, hyperlipidemia, tobacco dependence.  Precious Gilding, DO is patient's PCP. Last visit was July, 2023.  Past Medical History:  Diagnosis Date   Allergy    Anxiety    AR (allergic rhinitis)    Asthma    Cataract    CHF (congestive heart failure) (HCC)    Colon polyps    hyperplastic   Disturbance of skin sensation    facial paresthesia; left   DM2 (diabetes mellitus, type 2) ([place])    Dyslipidemia    Fatty liver    GERD (gastroesophageal reflux disease)    Hemorrhoids    Hyperlipidemia    Hypertension    essential, benign    Insomnia    Menopausal syndrome    Palpitations    hx   Routine general medical examination at a health care facility    Screening for malignant neoplasm of the cervix    Skin lesion    SVT (supraventricular tachycardia) ([place])    Tobacco abuse    Weakness    Patient Active Problem List   Diagnosis Date Noted   Atrophic vaginitis 02/14/2021   Vasomotor rhinitis 11/20/2020   Allergic rhinitis due to animal (cat) (dog) hair and dander 08/13/2020   Allergic rhinitis due to pollen 03/15/2020   Mild intermittent asthma 03/15/2020   Vitamin B12 deficiency 02/16/2020   SVT (supraventricular tachycardia) ([place]) 03/10/2019   Nodule of anterior chest wall 11/12/2018   Shoulder mass 08/22/2016   Sebaceous cyst 06/04/2015   Eczema 05/07/2015   Hypokalemia 05/22/2014   Hepatic steatosis 05/22/2014   Chronic low back pain 05/21/2014   S/P ablation of atrial flutter 01/09/2014   Dysphagia 02/24/2013   Extrinsic asthma 09/29/2012   Tobacco use disorder  11/03/2007   Essential hypertension 10/18/2007   PTSD (post-traumatic stress disorder) 10/03/2007   Controlled diabetes mellitus with neurologic complication, without long-term current use of insulin ([place]) 08/18/2007   Advanced aortic atherosclerosis ([place]) 02/17/2007   Allergic rhinitis 07/21/2006   GERD 07/21/2006   Past Surgical History:  Procedure Laterality Date   ABDOMINAL HYSTERECTOMY  1990   For fibroids benign.  Still has ovaries   ablation for SVT  9/09   Dr. Lovena Le   APPENDECTOMY     CATARACT EXTRACTION, BILATERAL     CESAREAN SECTION     COLONOSCOPY  2009   dental extractions     HEMORRHOID BANDING  2014   w/Dr.Medoff   SIGMOIDOSCOPY  2004   Current Outpatient Medications on File Prior to Visit  Medication Sig Dispense Refill   Accu-Chek Softclix Lancets lancets USE AS DIRECTED 3 TIMES A DAY 100 each 5   acetaminophen (TYLENOL) 500 MG tablet Take 500 mg by mouth every 6 (six) hours as needed.     aspirin 81 MG chewable tablet Chew 1 tablet (81 mg total) by mouth daily. 100 tablet 6   atenolol (TENORMIN) 25 MG tablet TAKE ONE TABLET BY MOUTH DAILY 90 tablet 1   atorvastatin (LIPITOR) 40 MG tablet Take 1 tablet (40 mg total) by mouth daily. 90 tablet 0   baclofen (LIORESAL) 10 MG tablet Take 1 tablet (  10 mg total) by mouth daily as needed for muscle spasms. 30 tablet 1   cetirizine (ZYRTEC) 10 MG tablet 1 tablet as needed     citalopram (CELEXA) 20 MG tablet TAKE 1 TABLET BY MOUTH EVERY DAY 90 tablet 3   clonazePAM (KLONOPIN) 0.5 MG tablet 1 tablet at bedtime.     EPINEPHrine (EPIPEN 2-PAK) 0.3 mg/0.3 mL IJ SOAJ injection as directed Injection PRN for systemic reaction 30 days 2 each 1   EPINEPHrine (EPIPEN 2-PAK) 0.3 mg/0.3 mL IJ SOAJ injection Inject as directed as needed. 2 each 1   EPINEPHrine 0.3 mg/0.3 mL IJ SOAJ injection See admin instructions.     glipiZIDE (GLUCOTROL) 10 MG tablet TAKE 1 TABLET BY MOUTH TWICE A DAY BEFORE A MEAL 60 tablet 0    hydrochlorothiazide (MICROZIDE) 12.5 MG capsule TAKE 1 CAPSULE BY MOUTH EVERY DAY 90 capsule 3   ipratropium (ATROVENT) 0.03 % nasal spray 1-2 sprays each nostril     levalbuterol (XOPENEX HFA) 45 MCG/ACT inhaler Inhale 2 puffs into the lungs.     magnesium oxide (MAG-OX) 400 (240 Mg) MG tablet TAKE ONE TABLET BY MOUTH DAILY 30 tablet 0   meclizine (ANTIVERT) 25 MG tablet Take 1-2 tablets (25-50 mg total) by mouth 2 (two) times daily as needed for dizziness. if having symptoms.  May repeat in 12 hours later. (Patient taking differently: Take 25-50 mg by mouth 2 (two) times daily as needed for dizziness. if having symptoms.  May repeat in 12 hours later.) 30 tablet 2   metFORMIN (GLUCOPHAGE) 1000 MG tablet Take 1 tablet (1,000 mg total) by mouth 2 (two) times daily with a meal. 180 tablet 3   Olopatadine HCl 0.6 % SOLN      ONETOUCH ULTRA test strip USE AS INSTRUCTED 100 strip 12   pantoprazole (PROTONIX) 40 MG tablet Take 1 tablet (40 mg total) by mouth daily. 90 tablet 2   Polyethylene Glycol 400 (BLINK TEARS OP)      potassium chloride (KLOR-CON) 10 MEQ tablet TAKE 1 TABLET BY MOUTH EVERY DAY 30 tablet 0   sucralfate (CARAFATE) 1 GM/10ML suspension Take 10 mLs (1 g total) by mouth 4 (four) times daily -  with meals and at bedtime. 420 mL 0   traMADol (ULTRAM) 50 MG tablet TAKE 1 TABLET BY MOUTH THREE TIMES DAILY AS NEEDED FOR LOW BACK PAIN. 30 tablet 0   Triamcinolone Acetonide (NASACORT AQ NA)      valsartan (DIOVAN) 40 MG tablet Take 0.5 tablets (20 mg total) by mouth daily. (Patient taking differently: Take 40 mg by mouth daily.) 90 tablet 3   No current facility-administered medications on file prior to visit.    Allergies  Allergen Reactions   Alcohol Swabs [Isopropyl Alcohol] Nausea Only    Use peroxide instead   Penicillins Other (See Comments)    Arm swelling   Prednisone Other (See Comments)    Could not sleep   Amoxicillin-Pot Clavulanate Other (See Comments)    She is unsure  what reaction she had   Social History   Occupational History   Occupation: Retired    Comment: Dance movement psychotherapist  Tobacco Use   Smoking status: Every Day    Packs/day: 1.00    Types: Cigarettes   Smokeless tobacco: Never  Vaping Use   Vaping Use: Never used  Substance and Sexual Activity   Alcohol use: Not Currently   Drug use: No   Sexual activity: Not Currently   Family  History  Problem Relation Age of Onset   Heart attack Father 28   Hypertension Mother    Stroke Mother    Diabetes Mother    Stomach cancer Mother    Cancer Mother    Diabetes Brother    Coronary artery disease Other        female, 1st degree relative <50   Stomach cancer Maternal Aunt    Diabetes Brother    Colon cancer Neg Hx    Esophageal cancer Neg Hx    Immunization History  Administered Date(s) Administered   Fluad Quad(high Dose 65+) 09/26/2020   Influenza Whole 01/12/2005, 10/03/2007, 11/11/2012   Influenza, High Dose Seasonal PF 04/05/2013, 10/17/2013, 10/29/2015, 10/17/2018, 02/13/2020   Influenza,inj,Quad PF,6+ Mos 10/08/2014, 10/18/2017   Influenza-Unspecified 10/13/2015, 10/17/2018   PFIZER Comirnaty(Gray Top)Covid-19 Tri-Sucrose Vaccine 05/22/2020   PFIZER(Purple Top)SARS-COV-2 Vaccination 03/07/2019, 03/28/2019, 10/16/2019, 02/13/2020   Pfizer Covid-19 Vaccine Bivalent Booster 62yr & up 11/18/2020, 02/03/2021   Pneumococcal Conjugate-13 10/18/2017   Pneumococcal Polysaccharide-23 10/12/2001, 09/09/2012, 06/02/2016, 03/16/2018, 11/27/2018, 05/10/2019, 07/03/2019, 02/13/2020   Td 03/15/2008   Tdap 04/13/2019   Zoster Recombinat (Shingrix) 01/20/2021     Review of Systems: Negative except as noted in the HPI.   Objective: There were no vitals filed for this visit.  Tammy LATORREis a pleasant 71y.o. female in NAD. AAO X 3.  Vascular Examination: Vascular status intact b/l with palpable pedal pulses. Pedal hair absent b/l. CFT immediate b/l. No edema.  No pain with calf compression b/l. Skin temperature gradient WNL b/l.   Neurological Examination: Sensation grossly intact b/l with 10 gram monofilament. Vibratory sensation intact b/l.   Dermatological Examination: Pedal skin with normal turgor, texture and tone b/l. Toenails 1-5 b/l thick, discolored, elongated with subungual debris and pain on dorsal palpation. No hyperkeratotic lesions noted b/l.   Musculoskeletal Examination: Muscle strength 5/5 to b/l LE. Hallux hammertoe noted b/l LE. Hammertoe deformity noted 2-5 b/l.  Footwear Assessment: Does the patient wear appropriate shoes? Yes. Does the patient need inserts/orthotics? No.  Radiographs: None  Last A1c:      Latest Ref Rng & Units 08/05/2021    1:21 PM 05/09/2021    1:32 PM 02/03/2021    1:39 PM 10/28/2020    3:19 PM  Hemoglobin A1C  Hemoglobin-A1c 0.0 - 7.0 % 8.2  7.6  8.1  8.4    ADA Risk Categorization: Low Risk :  Patient has all of the following: Intact protective sensation No prior foot ulcer  No severe deformity Pedal pulses present  Assessment: 1. Pain due to onychomycosis of toenails of both feet   2. Acquired hammertoes of both feet   3. Diabetic peripheral neuropathy associated with type 2 diabetes mellitus ([place]   4. Encounter for diabetic foot exam ([place]   Plan: -Examined patient. -No new findings. No new orders. -Diabetic foot examination performed today. -Continue foot and shoe inspections daily. Monitor blood glucose per PCP/Endocrinologist's recommendations. -Patient to continue soft, supportive shoe gear daily. -Toenails 1-5 b/l were debrided in length and girth with sterile nail nippers and dremel without iatrogenic bleeding.  -Patient/POA to call should there be question/concern in the interim. Return in about 3 months (around 12/24/2021).  JMarzetta Board DPM

## 2021-10-03 DIAGNOSIS — J301 Allergic rhinitis due to pollen: Secondary | ICD-10-CM | POA: Diagnosis not present

## 2021-10-03 DIAGNOSIS — J3089 Other allergic rhinitis: Secondary | ICD-10-CM | POA: Diagnosis not present

## 2021-10-06 ENCOUNTER — Other Ambulatory Visit (HOSPITAL_BASED_OUTPATIENT_CLINIC_OR_DEPARTMENT_OTHER): Payer: Self-pay

## 2021-10-08 DIAGNOSIS — J301 Allergic rhinitis due to pollen: Secondary | ICD-10-CM | POA: Diagnosis not present

## 2021-10-08 DIAGNOSIS — J3089 Other allergic rhinitis: Secondary | ICD-10-CM | POA: Diagnosis not present

## 2021-10-13 ENCOUNTER — Other Ambulatory Visit (HOSPITAL_BASED_OUTPATIENT_CLINIC_OR_DEPARTMENT_OTHER): Payer: Self-pay

## 2021-10-13 MED ORDER — AREXVY 120 MCG/0.5ML IM SUSR
INTRAMUSCULAR | 0 refills | Status: DC
  Filled 2021-10-13: qty 0.5, 1d supply, fill #0

## 2021-10-16 ENCOUNTER — Other Ambulatory Visit: Payer: Self-pay | Admitting: Student

## 2021-10-16 DIAGNOSIS — E876 Hypokalemia: Secondary | ICD-10-CM

## 2021-10-17 DIAGNOSIS — J301 Allergic rhinitis due to pollen: Secondary | ICD-10-CM | POA: Diagnosis not present

## 2021-10-17 DIAGNOSIS — J3089 Other allergic rhinitis: Secondary | ICD-10-CM | POA: Diagnosis not present

## 2021-10-19 ENCOUNTER — Other Ambulatory Visit: Payer: Self-pay | Admitting: Student

## 2021-10-19 DIAGNOSIS — E0840 Diabetes mellitus due to underlying condition with diabetic neuropathy, unspecified: Secondary | ICD-10-CM

## 2021-10-22 ENCOUNTER — Other Ambulatory Visit (HOSPITAL_BASED_OUTPATIENT_CLINIC_OR_DEPARTMENT_OTHER): Payer: Self-pay

## 2021-10-22 MED ORDER — FLUAD QUADRIVALENT 0.5 ML IM PRSY
PREFILLED_SYRINGE | INTRAMUSCULAR | 0 refills | Status: DC
  Filled 2021-10-22: qty 0.5, 1d supply, fill #0

## 2021-10-22 MED ORDER — COVID-19 MRNA 2023-2024 VACCINE (COMIRNATY) 0.3 ML INJECTION
INTRAMUSCULAR | 0 refills | Status: DC
  Filled 2021-10-22: qty 0.3, 1d supply, fill #0

## 2021-10-31 ENCOUNTER — Other Ambulatory Visit: Payer: Self-pay | Admitting: Family Medicine

## 2021-10-31 DIAGNOSIS — E0840 Diabetes mellitus due to underlying condition with diabetic neuropathy, unspecified: Secondary | ICD-10-CM

## 2021-11-05 DIAGNOSIS — J301 Allergic rhinitis due to pollen: Secondary | ICD-10-CM | POA: Diagnosis not present

## 2021-11-05 DIAGNOSIS — J3089 Other allergic rhinitis: Secondary | ICD-10-CM | POA: Diagnosis not present

## 2021-11-10 ENCOUNTER — Telehealth: Payer: Self-pay

## 2021-11-10 ENCOUNTER — Ambulatory Visit (INDEPENDENT_AMBULATORY_CARE_PROVIDER_SITE_OTHER): Payer: Medicare Other | Admitting: Student

## 2021-11-10 ENCOUNTER — Encounter: Payer: Self-pay | Admitting: Student

## 2021-11-10 VITALS — BP 144/72 | HR 100 | Ht 65.5 in | Wt 162.8 lb

## 2021-11-10 DIAGNOSIS — I10 Essential (primary) hypertension: Secondary | ICD-10-CM | POA: Diagnosis not present

## 2021-11-10 DIAGNOSIS — E0840 Diabetes mellitus due to underlying condition with diabetic neuropathy, unspecified: Secondary | ICD-10-CM | POA: Diagnosis not present

## 2021-11-10 DIAGNOSIS — F17200 Nicotine dependence, unspecified, uncomplicated: Secondary | ICD-10-CM

## 2021-11-10 LAB — POCT GLYCOSYLATED HEMOGLOBIN (HGB A1C): HbA1c, POC (controlled diabetic range): 8.2 % — AB (ref 0.0–7.0)

## 2021-11-10 MED ORDER — HYDROCHLOROTHIAZIDE 25 MG PO TABS: 25.0000 mg | ORAL_TABLET | Freq: Every day | ORAL | 3 refills | Status: DC

## 2021-11-10 NOTE — Telephone Encounter (Signed)
Called pt. LVM asking for a return call to give pt the following information.  CT scheduled for Wednesday Nov 8th At 10:30.  Go into hospital entrance C. Near ED. Must arrive by 10:15.  No special instructions. Ottis Stain, CMA

## 2021-11-10 NOTE — Assessment & Plan Note (Addendum)
On recheck continues to be at 140s over 67s.  On vitals review does appear to be more elevated in the past year. -Increase HCTZ to 25 mg daily -Continue valsartan 40 mg daily -Continue atenolol (more for tachycardia per chart review) -BMP -Nurse visit in 2 weeks for blood pressure recheck and BMP

## 2021-11-10 NOTE — Progress Notes (Signed)
SUBJECTIVE:   Chief compliant/HPI: annual examination  [name] is a 71 y.o. who presents today for an annual exam.   Diabetic Follow Up: Patient is a 71 y.o. female who present today for diabetic follow up.   Patient endorses difficulties with diet since last A1C - "Drinks lots of soda"  Home medications include: Metformin 1000 mg twice daily, glipizide 10 mg twice daily. Jardiance gave her yeast infection and she does not want to be on any SGLT2 medications.  She is not interested in adding on any medications in addition to this. ACEi/ARB: yes valsartan 40 mg daily Statin: yes atorvastatin 40 mg Patient endorses taking these medications as prescribed.  Most recent A1Cs:  Lab Results  Component Value Date   HGBA1C 8.2 (A) 11/10/2021   HGBA1C 8.2 (A) 08/05/2021   HGBA1C 7.6 (A) 05/09/2021   Last Microalbumin, LDL, Creatinine: Lab Results  Component Value Date   LDLCALC 48 05/09/2020   CREATININE 0.70 12/16/2020   Patient does check blood glucose on a regular basis. Mostly 150s. No lows  Patient is not up to date on diabetic eye. Going next month  Hypertension: Patient is a 71 y.o. female who present today for follow up of hypertension.   Patient endorses no problems  Home medications include:  Atenolol Hctz 12.5 Valsartan 40 mg daily Patient endorses taking these medications as prescribed. Denies any headache, vision changes, shortness of breath, lower extremity swelling or chest pain   Most recent creatinine trend:  Lab Results  Component Value Date   CREATININE 0.70 12/16/2020   CREATININE 0.59 10/07/2020   CREATININE 0.68 09/26/2020   History tabs reviewed and updated.   Review of systems form reviewed and notable for smoking-discussed below patient is in precontemplative.   OBJECTIVE:   BP (!) 144/72   Pulse 100   Ht 5' 5.5" (1.664 m)   Wt 162 lb 12.8 oz (73.8 kg)   SpO2 98%   BMI 26.68 kg/m   General: Well appearing, NAD, awake,  alert, responsive to questions Head: Normocephalic atraumatic, no thyroid masses palpated CV: Regular rate and rhythm no murmurs rubs or gallops Respiratory: Coarse breath sounds in posterior lung fields, chest rises symmetrically,  no increased work of breathing Abdomen: Soft, non-tender, non-distended Extremities: Moves upper and lower extremities freely, no edema in LE, no calf swelling or tenderness Neuro: No focal deficits Skin: No rashes or lesions visualized   ASSESSMENT/PLAN:   Essential hypertension On recheck continues to be at 140s over 73s.  On vitals review does appear to be more elevated in the past year. -Increase HCTZ to 25 mg daily -Continue valsartan 40 mg daily -Continue atenolol (more for tachycardia per chart review) -BMP -Nurse visit in 2 weeks for blood pressure recheck and BMP  Controlled diabetes mellitus with neurologic complication, without long-term current use of insulin (HCC) Continues to have elevated A1c at 8.2.  Similar to last time.  We had a in-depth conversation about GLP-1 medications and benefits of renal protection as well as added benefit of weight loss.  Patient is very against trying this medication.  We also discussed DPP-4 medication class as well though not as many added benefits with this and patient does not want to add anything at this time and wants to just trial not drinking sodas.  I discussed that I would recommend medications at this point but she denies wanting to try anything new for now.  Has not had any lows on sulfonylurea. -A1c  recheck in 3 months -Continue metformin 1000 mg twice daily -Glipizide 10 mg daily -On ACE/Statin -Lipid panel   Annual Examination  See AVS for age appropriate recommendations  BP reviewed and not at goal  Considered the following items based upon USPSTF recommendations: Diabetes screening: ordered Screening for elevated cholesterol: ordered HIV testing:  NR 8 years ago Hepatitis C:  Negative 8 yrs  ago Syphilis if at high risk:  NR, not high risk GC/CT  negative 7 mo ago Osteoporosis screening considered based upon risk of fracture from [facility] calculator. Major osteoporotic fracture risk is 12%. Last one in 2021 with T score of -0.7 (normal).   Breast cancer screening:  Has set up for January Colorectal cancer screening: discussed, colonoscopy for 2024 Lung cancer screening: discussed. See documentation below regarding indications/risks/benefits. Adults aged 79 to 57 years who have a 20 pack-year smoking history and currently smoke or have quit within the past 15 year. 1-1/2 a pack a day since 70 years old.  Still smoking and not interested in cessation currently. Vaccinations  Flu/Covid UTD.   Follow up in 1 year or sooner if indicated.   Gerrit Heck, MD [place]

## 2021-11-10 NOTE — Patient Instructions (Addendum)
It was great to see you! Thank you for allowing me to participate in your care!   I recommend that you always bring your medications to each appointment as this makes it easy to ensure we are on the correct medications and helps [place] not miss when refills are needed.  Our plans for today:  - Your A1c was 8.2, I would recommend additional medications to help bring this dow, please let me know if interested - I am ordering a CT Lung for screening, I recommend quitting smoking and if needing help please let us know - get mammogram this year - lets increase your HCTZ to 25 mg daily as your BP is elevated today and in past visits - Please follow up for a nurse visit for BP recheck in 2 weeks     Today at your annual preventive visit we talked about the following measures:   I recommend 150 minutes of exercise per week-try 30 minutes 5 days per week We discussed reducing sugary beverages (like soda and juice) and increasing leafy greens and whole fruits.  We discussed avoiding tobacco and alcohol.  I recommend avoiding illicit substances.   We are checking some labs today, I will call you if they are abnormal will send you a MyChart message or a letter if they are normal.  If you do not hear about your labs in the next 2 weeks please let us know.  Take care and seek immediate care sooner if you develop any concerns. Please remember to show up 15 minutes before your scheduled appointment time!  Gerrit Heck, MD [place]

## 2021-11-10 NOTE — Assessment & Plan Note (Addendum)
Continues to have elevated A1c at 8.2.  Similar to last time.  We had a in-depth conversation about GLP-1 medications and benefits of renal protection as well as added benefit of weight loss.  Patient is very against trying this medication.  We also discussed DPP-4 medication class as well though not as many added benefits with this and patient does not want to add anything at this time and wants to just trial not drinking sodas.  I discussed that I would recommend medications at this point but she denies wanting to try anything new for now.  Has not had any lows on sulfonylurea. -A1c recheck in 3 months -Continue metformin 1000 mg twice daily -Glipizide 10 mg daily -On ACE/Statin -Lipid panel

## 2021-11-11 LAB — BASIC METABOLIC PANEL
BUN/Creatinine Ratio: 8 — ABNORMAL LOW (ref 12–28)
BUN: 6 mg/dL — ABNORMAL LOW (ref 8–27)
CO2: 25 mmol/L (ref 20–29)
Calcium: 9.7 mg/dL (ref 8.7–10.3)
Chloride: 104 mmol/L (ref 96–106)
Creatinine, Ser: 0.77 mg/dL (ref 0.57–1.00)
Glucose: 197 mg/dL — ABNORMAL HIGH (ref 70–99)
Potassium: 3.6 mmol/L (ref 3.5–5.2)
Sodium: 143 mmol/L (ref 134–144)
eGFR: 83 mL/min/{1.73_m2} (ref >59)

## 2021-11-11 LAB — LIPID PANEL
Chol/HDL Ratio: 2.4 ratio (ref 0.0–4.4)
Cholesterol, Total: 108 mg/dL (ref 100–199)
HDL: 45 mg/dL (ref >39)
LDL Chol Calc (NIH): 44 mg/dL (ref 0–99)
Triglycerides: 98 mg/dL (ref 0–149)
VLDL Cholesterol Cal: 19 mg/dL (ref 5–40)

## 2021-11-12 ENCOUNTER — Encounter: Payer: Self-pay | Admitting: Student

## 2021-11-12 DIAGNOSIS — J301 Allergic rhinitis due to pollen: Secondary | ICD-10-CM | POA: Diagnosis not present

## 2021-11-12 DIAGNOSIS — J3081 Allergic rhinitis due to animal (cat) (dog) hair and dander: Secondary | ICD-10-CM | POA: Diagnosis not present

## 2021-11-12 DIAGNOSIS — J3089 Other allergic rhinitis: Secondary | ICD-10-CM | POA: Diagnosis not present

## 2021-11-17 NOTE — Telephone Encounter (Signed)
Tried to call pt again. LVM on home number and the cell phone number. If pt calls, please give her information below about her CT appt. Ottis Stain, CMA

## 2021-11-19 ENCOUNTER — Ambulatory Visit (HOSPITAL_COMMUNITY)
Admission: RE | Admit: 2021-11-19 | Discharge: 2021-11-19 | Disposition: A | Discharge status: Home / Self Care | Payer: Medicare Other | Source: Ambulatory Visit | Attending: Family Medicine | Admitting: Family Medicine

## 2021-11-19 DIAGNOSIS — J3081 Allergic rhinitis due to animal (cat) (dog) hair and dander: Secondary | ICD-10-CM | POA: Diagnosis not present

## 2021-11-19 DIAGNOSIS — J3089 Other allergic rhinitis: Secondary | ICD-10-CM | POA: Diagnosis not present

## 2021-11-19 DIAGNOSIS — Z122 Encounter for screening for malignant neoplasm of respiratory organs: Secondary | ICD-10-CM | POA: Insufficient documentation

## 2021-11-19 DIAGNOSIS — I2510 Atherosclerotic heart disease of native coronary artery without angina pectoris: Secondary | ICD-10-CM | POA: Insufficient documentation

## 2021-11-19 DIAGNOSIS — F17200 Nicotine dependence, unspecified, uncomplicated: Secondary | ICD-10-CM | POA: Insufficient documentation

## 2021-11-19 DIAGNOSIS — F17210 Nicotine dependence, cigarettes, uncomplicated: Secondary | ICD-10-CM | POA: Diagnosis not present

## 2021-11-19 DIAGNOSIS — J301 Allergic rhinitis due to pollen: Secondary | ICD-10-CM | POA: Diagnosis not present

## 2021-11-19 DIAGNOSIS — I700 Atherosclerosis of aorta: Secondary | ICD-10-CM | POA: Insufficient documentation

## 2021-11-19 DIAGNOSIS — Z87891 Personal history of nicotine dependence: Secondary | ICD-10-CM | POA: Diagnosis not present

## 2021-11-22 ENCOUNTER — Encounter: Payer: Self-pay | Admitting: Student

## 2021-11-24 ENCOUNTER — Other Ambulatory Visit: Payer: Self-pay

## 2021-11-24 ENCOUNTER — Ambulatory Visit: Payer: Medicare Other

## 2021-11-24 DIAGNOSIS — I10 Essential (primary) hypertension: Secondary | ICD-10-CM | POA: Diagnosis not present

## 2021-11-24 DIAGNOSIS — E0840 Diabetes mellitus due to underlying condition with diabetic neuropathy, unspecified: Secondary | ICD-10-CM

## 2021-11-24 MED ORDER — GLIPIZIDE 10 MG PO TABS: ORAL_TABLET | ORAL | 0 refills | Status: DC

## 2021-11-24 NOTE — Telephone Encounter (Signed)
Patient calls nurse line again checking the status of medication. She reports she is completely out. She does understand our policy, however she was told by her pharmacy that they would send over a request last week, however they did not. Delaying further.

## 2021-11-24 NOTE — Progress Notes (Signed)
Patient here today for BP check.      Last BP was on 11/10/2021 and was 144/72.  BP today is 112/64 with a pulse of 84.    Checked BP in left arm with large cuff.    Symptoms present: None.   Patient last took BP medications this morning. Atenolol '25mg'$ , Valsartan '40mg'$ , HCTZ '25mg'$ .  Patient dropped off at lab for BMP.  Will forward to provider who saw patient.

## 2021-11-25 ENCOUNTER — Encounter: Payer: Self-pay | Admitting: Student

## 2021-11-25 DIAGNOSIS — E119 Type 2 diabetes mellitus without complications: Secondary | ICD-10-CM | POA: Diagnosis not present

## 2021-11-25 DIAGNOSIS — H524 Presbyopia: Secondary | ICD-10-CM | POA: Diagnosis not present

## 2021-11-25 DIAGNOSIS — Z961 Presence of intraocular lens: Secondary | ICD-10-CM | POA: Diagnosis not present

## 2021-11-25 LAB — BASIC METABOLIC PANEL
BUN/Creatinine Ratio: 11 — ABNORMAL LOW (ref 12–28)
BUN: 10 mg/dL (ref 8–27)
CO2: 26 mmol/L (ref 20–29)
Calcium: 9.6 mg/dL (ref 8.7–10.3)
Chloride: 97 mmol/L (ref 96–106)
Creatinine, Ser: 0.88 mg/dL (ref 0.57–1.00)
Glucose: 264 mg/dL — ABNORMAL HIGH (ref 70–99)
Potassium: 3.7 mmol/L (ref 3.5–5.2)
Sodium: 139 mmol/L (ref 134–144)
eGFR: 71 mL/min/{1.73_m2} (ref >59)

## 2021-11-27 DIAGNOSIS — J301 Allergic rhinitis due to pollen: Secondary | ICD-10-CM | POA: Diagnosis not present

## 2021-11-27 DIAGNOSIS — J3089 Other allergic rhinitis: Secondary | ICD-10-CM | POA: Diagnosis not present

## 2021-12-02 ENCOUNTER — Other Ambulatory Visit: Payer: Self-pay | Admitting: Student

## 2021-12-02 DIAGNOSIS — I10 Essential (primary) hypertension: Secondary | ICD-10-CM

## 2021-12-03 DIAGNOSIS — J3081 Allergic rhinitis due to animal (cat) (dog) hair and dander: Secondary | ICD-10-CM | POA: Diagnosis not present

## 2021-12-03 DIAGNOSIS — J301 Allergic rhinitis due to pollen: Secondary | ICD-10-CM | POA: Diagnosis not present

## 2021-12-03 DIAGNOSIS — J3089 Other allergic rhinitis: Secondary | ICD-10-CM | POA: Diagnosis not present

## 2021-12-11 ENCOUNTER — Encounter: Payer: Self-pay | Admitting: Internal Medicine

## 2021-12-11 ENCOUNTER — Ambulatory Visit: Payer: Medicare Other | Attending: Internal Medicine | Admitting: Internal Medicine

## 2021-12-11 VITALS — BP 136/70 | HR 82 | Ht 65.5 in | Wt 163.2 lb

## 2021-12-11 DIAGNOSIS — I10 Essential (primary) hypertension: Secondary | ICD-10-CM | POA: Diagnosis not present

## 2021-12-11 DIAGNOSIS — I471 Supraventricular tachycardia, unspecified: Secondary | ICD-10-CM | POA: Diagnosis not present

## 2021-12-11 NOTE — Patient Instructions (Signed)

## 2021-12-11 NOTE — Progress Notes (Signed)
HPI [name] returns today for followup of SVT and HTN. She is a pleasant 71 yo woman with a h/o above, and ongoing tobacco abuse. She had lost weight after her husband died but this has improved and her weight has now stabilized. She is up 5 lbs since her last visit. She has not had syncope or chest pain. No palpitations.  She had a CT scan which was reassuring by her report. Allergies  Allergen Reactions   Alcohol Swabs [Isopropyl Alcohol] Nausea Only    Use peroxide instead   Penicillins Other (See Comments)    Arm swelling   Prednisone Other (See Comments)    Could not sleep   Amoxicillin-Pot Clavulanate Other (See Comments)    She is unsure what reaction she had     Current Outpatient Medications  Medication Sig Dispense Refill   Accu-Chek Softclix Lancets lancets USE AS DIRECTED 3 TIMES A DAY 100 each 5   acetaminophen (TYLENOL) 500 MG tablet Take 500 mg by mouth every 6 (six) hours as needed.     aspirin 81 MG chewable tablet Chew 1 tablet (81 mg total) by mouth daily. 100 tablet 6   atenolol (TENORMIN) 25 MG tablet TAKE ONE TABLET BY MOUTH DAILY 90 tablet 1   atorvastatin (LIPITOR) 40 MG tablet Take 1 tablet by mouth once daily 90 tablet 0   cetirizine (ZYRTEC) 10 MG tablet 1 tablet as needed     citalopram (CELEXA) 20 MG tablet TAKE 1 TABLET BY MOUTH EVERY DAY 90 tablet 3   clonazePAM (KLONOPIN) 0.5 MG tablet 1 tablet at bedtime.     EPINEPHrine (EPIPEN 2-PAK) 0.3 mg/0.3 mL IJ SOAJ injection as directed Injection PRN for systemic reaction 30 days 2 each 1   glipiZIDE (GLUCOTROL) 10 MG tablet Take 1 tablet by mouth twice a day before a meal 60 tablet 0   hydrochlorothiazide (HYDRODIURIL) 25 MG tablet Take 1 tablet (25 mg total) by mouth daily. 90 tablet 3   ipratropium (ATROVENT) 0.03 % nasal spray 1-2 sprays each nostril     levalbuterol (XOPENEX HFA) 45 MCG/ACT inhaler Inhale 2 puffs into the lungs.     magnesium oxide (MAG-OX) 400 (240 Mg) MG tablet TAKE ONE  TABLET BY MOUTH DAILY 30 tablet 0   meclizine (ANTIVERT) 25 MG tablet Take 1-2 tablets (25-50 mg total) by mouth 2 (two) times daily as needed for dizziness. if having symptoms.  May repeat in 12 hours later. (Patient taking differently: Take 25-50 mg by mouth 2 (two) times daily as needed for dizziness. if having symptoms.  May repeat in 12 hours later.) 30 tablet 2   metFORMIN (GLUCOPHAGE) 1000 MG tablet Take 1 tablet (1,000 mg total) by mouth 2 (two) times daily with a meal. 180 tablet 3   Olopatadine HCl 0.6 % SOLN      ONETOUCH ULTRA test strip USE AS INSTRUCTED 100 strip 12   pantoprazole (PROTONIX) 40 MG tablet Take 1 tablet (40 mg total) by mouth daily. 90 tablet 2   Polyethylene Glycol 400 (BLINK TEARS OP)      potassium chloride (KLOR-CON) 10 MEQ tablet TAKE 1 TABLET BY MOUTH EVERY DAY 90 tablet 1   sucralfate (CARAFATE) 1 GM/10ML suspension Take 10 mLs (1 g total) by mouth 4 (four) times daily -  with meals and at bedtime. 420 mL 0   traMADol (ULTRAM) 50 MG tablet TAKE 1 TABLET BY MOUTH THREE TIMES DAILY AS NEEDED FOR LOW BACK PAIN.  30 tablet 0   Triamcinolone Acetonide (NASACORT AQ NA)      valsartan (DIOVAN) 40 MG tablet Take 0.5 tablets (20 mg total) by mouth daily. (Patient taking differently: Take 40 mg by mouth daily.) 90 tablet 3   No current facility-administered medications for this visit.     Past Medical History:  Diagnosis Date   Allergy    Anxiety    AR (allergic rhinitis)    Asthma    Cataract    CHF (congestive heart failure) (HCC)    Colon polyps    hyperplastic   Disturbance of skin sensation    facial paresthesia; left   DM2 (diabetes mellitus, type 2) (HCC)    Dyslipidemia    Fatty liver    GERD (gastroesophageal reflux disease)    Hemorrhoids    Hyperlipidemia    Hypertension    essential, benign    Insomnia    Menopausal syndrome    Palpitations    hx   Routine general medical examination at a health care facility    Screening for malignant  neoplasm of the cervix    Skin lesion    SVT (supraventricular tachycardia)    Tobacco abuse    Weakness     ROS:   All systems reviewed and negative except as noted in the HPI.   Past Surgical History:  Procedure Laterality Date   ABDOMINAL HYSTERECTOMY  1990   For fibroids benign.  Still has ovaries   ablation for SVT  9/09   Dr. Lovena Le   APPENDECTOMY     CATARACT EXTRACTION, BILATERAL     CESAREAN SECTION     COLONOSCOPY  2009   dental extractions     HEMORRHOID BANDING  2014   w/Dr.Medoff   SIGMOIDOSCOPY  2004     Family History  Problem Relation Age of Onset   Heart attack Father 62   Hypertension Mother    Stroke Mother    Diabetes Mother    Stomach cancer Mother    Cancer Mother    Diabetes Brother    Coronary artery disease Other        female, 1st degree relative <50   Stomach cancer Maternal Aunt    Diabetes Brother    Colon cancer Neg Hx    Esophageal cancer Neg Hx      Social History   Socioeconomic History   Marital status: Widowed    Spouse name: Not on file   Number of children: 3   Years of education: 82   Highest education level: Some college, no degree  Occupational History   Occupation: Retired    Comment: Dance movement psychotherapist  Tobacco Use   Smoking status: Every Day    Packs/day: 1.00    Types: Cigarettes   Smokeless tobacco: Never  Vaping Use   Vaping Use: Never used  Substance and Sexual Activity   Alcohol use: Not Currently   Drug use: No   Sexual activity: Not Currently  Other Topics Concern   Not on file  Social History Narrative   Married, does not get regular exercise. On disability.    2 miscarriages, one at 4 and one at 5 months; death at 1 day, unsure of cause.       Health Care POA: on file   Emergency Contact: Jeanene Erb (friend) [phone] Terri Piedra (friend's daughter) [phone]   End of Life Plan: Gave pt pamphlet   Lives in 2 story apartment. Handrails on stairs. Smoke  alarms, has throw rugs with backing on them. No grab bars in bathroom.   Who lives with you: husband   Any pets: 0   Diet: pt has varied diet. Eats vegetables, some fruit, and meat.   Exercise: pt is disabled and does not have a regular exercise routine   Seatbelts: Pt reports wearing seatbelt when in vehicle   Sun Exposure/Protection: Pt reports wearing a hat, sunglasses and facial sunscreen when outside   Hobbies: shopping    Social Determinants of Health   Financial Resource Strain: Low Risk  (11/04/2017)   Overall Financial Resource Strain (CARDIA)    Difficulty of Paying Living Expenses: Not hard at all  Food Insecurity: No Food Insecurity (11/04/2017)   Hunger Vital Sign    Worried About Running Out of Food in the Last Year: Never true    Ran Out of Food in the Last Year: Never true  Transportation Needs: No Transportation Needs (11/04/2017)   PRAPARE - Hydrologist (Medical): No    Lack of Transportation (Non-Medical): No  Physical Activity: Inactive (11/04/2017)   Exercise Vital Sign    Days of Exercise per Week: 0 days    Minutes of Exercise per Session: 0 min  Stress: Stress Concern Present (11/04/2017)   [place]    Feeling of Stress : To some extent  Social Connections: Socially Integrated (11/04/2017)   Social Connection and Isolation Panel [NHANES]    Frequency of Communication with Friends and Family: More than three times a week    Frequency of Social Gatherings with Friends and Family: Once a week    Attends Religious Services: 1 to 4 times per year    Active Member of [employer] or Organizations: Yes    Attends Music therapist: More than 4 times per year    Marital Status: Married  Human resources officer Violence: Not At Risk (11/04/2017)   Humiliation, Afraid, Rape, and Kick questionnaire    Fear of Current or Ex-Partner: No    Emotionally Abused: No     Physically Abused: No    Sexually Abused: No     BP 136/70   Pulse 82   Ht 5' 5.5" (1.664 m)   Wt 163 lb 3.2 oz (74 kg)   SpO2 99%   BMI 26.74 kg/m   Physical Exam:  Well appearing NAD HEENT: Unremarkable Neck:  No JVD, no thyromegally Lymphatics:  No adenopathy Back:  No CVA tenderness Lungs:  Clear HEART:  Regular rate rhythm, no murmurs, no rubs, no clicks Abd:  soft, positive bowel sounds, no organomegally, no rebound, no guarding Ext:  2 plus pulses, no edema, no cyanosis, no clubbing Skin:  No rashes no nodules Neuro:  CN II through XII intact, motor grossly intact  EKG - nsr  Assess/Plan:   SVT - she has been asymptomatic. Continue current meds. 2. HTN - her bp is minimally elevated. She will continue her current meds 3. Tobacco abuse - I encouraged her to stop smoking. She is down to 1/2 ppd. 4. Weight loss - this has subsided. She was encouraged to stay where she is and she has   Salome Spotted

## 2021-12-12 ENCOUNTER — Other Ambulatory Visit: Payer: Self-pay | Admitting: Family Medicine

## 2021-12-12 DIAGNOSIS — I10 Essential (primary) hypertension: Secondary | ICD-10-CM

## 2021-12-12 DIAGNOSIS — H1045 Other chronic allergic conjunctivitis: Secondary | ICD-10-CM | POA: Diagnosis not present

## 2021-12-12 DIAGNOSIS — J3089 Other allergic rhinitis: Secondary | ICD-10-CM | POA: Diagnosis not present

## 2021-12-12 DIAGNOSIS — J4520 Mild intermittent asthma, uncomplicated: Secondary | ICD-10-CM | POA: Diagnosis not present

## 2021-12-12 DIAGNOSIS — K219 Gastro-esophageal reflux disease without esophagitis: Secondary | ICD-10-CM | POA: Diagnosis not present

## 2021-12-12 NOTE — Addendum Note (Signed)
Addended by: Gwendlyn Deutscher on: 12/12/2021 03:13 PM   Modules accepted: Orders

## 2021-12-17 NOTE — Progress Notes (Signed)
[facility] Quality Team Note  Name: [name] Date of Birth: Apr 08, 1950 MRN: [id number] Date: 12/17/2021  [facility] Quality Team has reviewed this patient's chart, please see recommendations below:  [facility] Quality Other; (KED GAP: KIDNEY HEALTH EVALUATION- PATIENT NEEDS URINE MICROALBUMIN/CREATININE RATIO TEST COMPLETED BEFORE END OF YEAR FOR GAP CLOSURE)

## 2021-12-19 ENCOUNTER — Other Ambulatory Visit: Payer: Self-pay | Admitting: Student

## 2021-12-19 DIAGNOSIS — E0840 Diabetes mellitus due to underlying condition with diabetic neuropathy, unspecified: Secondary | ICD-10-CM

## 2021-12-23 ENCOUNTER — Other Ambulatory Visit: Payer: Self-pay | Admitting: Family Medicine

## 2021-12-23 DIAGNOSIS — E0840 Diabetes mellitus due to underlying condition with diabetic neuropathy, unspecified: Secondary | ICD-10-CM

## 2021-12-26 ENCOUNTER — Ambulatory Visit (INDEPENDENT_AMBULATORY_CARE_PROVIDER_SITE_OTHER): Payer: Medicare Other | Admitting: Student

## 2021-12-26 ENCOUNTER — Ambulatory Visit: Payer: Medicare Other | Admitting: Podiatry

## 2021-12-26 VITALS — BP 114/70 | HR 80 | Ht 65.5 in | Wt 159.0 lb

## 2021-12-26 DIAGNOSIS — R252 Cramp and spasm: Secondary | ICD-10-CM | POA: Diagnosis not present

## 2021-12-26 DIAGNOSIS — J3089 Other allergic rhinitis: Secondary | ICD-10-CM | POA: Diagnosis not present

## 2021-12-26 DIAGNOSIS — J3081 Allergic rhinitis due to animal (cat) (dog) hair and dander: Secondary | ICD-10-CM | POA: Diagnosis not present

## 2021-12-26 DIAGNOSIS — K219 Gastro-esophageal reflux disease without esophagitis: Secondary | ICD-10-CM

## 2021-12-26 DIAGNOSIS — J301 Allergic rhinitis due to pollen: Secondary | ICD-10-CM | POA: Diagnosis not present

## 2021-12-26 MED ORDER — PANTOPRAZOLE SODIUM 40 MG PO TBEC: 80.0000 mg | DELAYED_RELEASE_TABLET | Freq: Every day | ORAL | 2 refills | Status: DC

## 2021-12-26 NOTE — Patient Instructions (Signed)
It was great to see you! Thank you for allowing me to participate in your care!  I recommend that you always bring your medications to each appointment as this makes it easy to ensure you are on the correct medications and helps [place] not miss when refills are needed.  Our plans for today:  -I have increased your Protonix to 80 mg once a day and I recommend that you follow-up with your gastroenterologist ASAP -For leg cramps we are doing some blood work to see if we can find a cause.  In the meantime you can try diet tonic water daily, hot showers and stretching before bed -Return in 1 month to check hemoglobin A1c and follow-up  We are checking some labs today, I will call you if they are abnormal will send you a MyChart message or a letter if they are normal.  If you do not hear about your labs in the next 2 weeks please let us know.  Take care and seek immediate care sooner if you develop any concerns.   Dr. Precious Gilding, DO [facility]

## 2021-12-26 NOTE — Progress Notes (Signed)
    SUBJECTIVE:   CHIEF COMPLAINT / HPI:   Leg cramps Occurring for past two weeks in both calves. Feels like muscle is contracting. Sometimes at night, sometimes during the day. Usually occur when she is trying to get out of bed to go to the bathroom.  Acid Reflux Currently takes Protonix 40 mg. Reflux symptoms have worsened over past year including epigastric pain, esophageal pain, and belching. She takes 2 gas-X and it helps. Symptoms worse after eating.   PERTINENT  PMH / PSH: T2DM, GERD, HTN  OBJECTIVE:   BP 114/70   Pulse 80   Ht 5' 5.5" (1.664 m)   Wt 159 lb (72.1 kg)   SpO2 100%   BMI 26.06 kg/m    General: NAD, pleasant, able to participate in exam Cardiac: RRR, no murmurs. Respiratory: CTAB, normal effort, No wheezes, rales or rhonchi Abdomen: nontender, nondistended,soft Extremities: no edema or cyanosis. Skin: warm and dry Neuro: alert, no obvious focal deficits Psych: Normal affect and mood  ASSESSMENT/PLAN:   GERD We use shared decision making to increase Protonix to 80 mg daily.  Advised patient to continue with her Carafate and to follow-up with her GI doctor ASAP as they may need to further explore her worsening reflux symptoms, may need to be scoped.  Bilateral leg cramps Patient is already taking magnesium so cannot do trial of that.  Will check BMP to ensure electrolytes are WNL, will check CBC to ensure patient is not anemic.  Advised patient to try diet tonic water daily along with stretching and hot showers.  Unfortunately it is difficult to stop leg cramps especially there is no known cause.  If cause is not known and leg cramps continue, can look into a calcium channel blocker.     Dr. Precious Gilding, [place]

## 2021-12-26 NOTE — Assessment & Plan Note (Signed)
We use shared decision making to increase Protonix to 80 mg daily.  Advised patient to continue with her Carafate and to follow-up with her GI doctor ASAP as they may need to further explore her worsening reflux symptoms, may need to be scoped.

## 2021-12-26 NOTE — Assessment & Plan Note (Signed)
Patient is already taking magnesium so cannot do trial of that.  Will check BMP to ensure electrolytes are WNL, will check CBC to ensure patient is not anemic.  Advised patient to try diet tonic water daily along with stretching and hot showers.  Unfortunately it is difficult to stop leg cramps especially there is no known cause.  If cause is not known and leg cramps continue, can look into a calcium channel blocker.

## 2021-12-27 LAB — BASIC METABOLIC PANEL
BUN/Creatinine Ratio: 17 (ref 12–28)
BUN: 20 mg/dL (ref 8–27)
CO2: 22 mmol/L (ref 20–29)
Calcium: 9 mg/dL (ref 8.7–10.3)
Chloride: 97 mmol/L (ref 96–106)
Creatinine, Ser: 1.17 mg/dL — ABNORMAL HIGH (ref 0.57–1.00)
Glucose: 269 mg/dL — ABNORMAL HIGH (ref 70–99)
Potassium: 3.7 mmol/L (ref 3.5–5.2)
Sodium: 137 mmol/L (ref 134–144)
eGFR: 51 mL/min/{1.73_m2} — ABNORMAL LOW (ref >59)

## 2021-12-27 LAB — CBC
Hematocrit: 38.6 % (ref 34.0–46.6)
Hemoglobin: 13.7 g/dL (ref 11.1–15.9)
MCH: 31.9 pg (ref 26.6–33.0)
MCHC: 35.5 g/dL (ref 31.5–35.7)
MCV: 90 fL (ref 79–97)
Platelets: 276 10*3/uL (ref 150–450)
RBC: 4.3 x10E6/uL (ref 3.77–5.28)
RDW: 12.1 % (ref 11.7–15.4)
WBC: 11.4 10*3/uL — ABNORMAL HIGH (ref 3.4–10.8)

## 2021-12-29 ENCOUNTER — Telehealth: Payer: Self-pay | Admitting: Internal Medicine

## 2021-12-29 ENCOUNTER — Telehealth: Payer: Self-pay | Admitting: Student

## 2021-12-29 NOTE — Telephone Encounter (Signed)
Spoke with patient on the phone regarding recent lab work.  Will recheck BMP to monitor kidney function next month when she comes for A1c.

## 2021-12-29 NOTE — Telephone Encounter (Signed)
Inbound call from patient stating that she feels like she is going to " blow up" and is requesting a call back to discuss. Please advise.

## 2021-12-29 NOTE — Telephone Encounter (Signed)
Pt calling stating she is having a lot of gas and heartburn. States she is taking her protonix BID and that she takes it 77mn prior to breakfast. Discussed with her that there is nothing prescription available for gas, otc gasx or phazyme is recommended. She states she takes the gasx but it does not help. Pt has not been seen since 2021, scheduled her to see Dr. PHenrene Pastor1/16/24 at [place] pt aware of appt.

## 2022-01-01 DIAGNOSIS — J3089 Other allergic rhinitis: Secondary | ICD-10-CM | POA: Diagnosis not present

## 2022-01-01 DIAGNOSIS — J301 Allergic rhinitis due to pollen: Secondary | ICD-10-CM | POA: Diagnosis not present

## 2022-01-01 DIAGNOSIS — J3081 Allergic rhinitis due to animal (cat) (dog) hair and dander: Secondary | ICD-10-CM | POA: Diagnosis not present

## 2022-01-08 DIAGNOSIS — J3081 Allergic rhinitis due to animal (cat) (dog) hair and dander: Secondary | ICD-10-CM | POA: Diagnosis not present

## 2022-01-08 DIAGNOSIS — J301 Allergic rhinitis due to pollen: Secondary | ICD-10-CM | POA: Diagnosis not present

## 2022-01-08 DIAGNOSIS — J3089 Other allergic rhinitis: Secondary | ICD-10-CM | POA: Diagnosis not present

## 2022-01-09 ENCOUNTER — Ambulatory Visit (INDEPENDENT_AMBULATORY_CARE_PROVIDER_SITE_OTHER): Payer: Medicare Other | Admitting: Student

## 2022-01-09 ENCOUNTER — Ambulatory Visit: Payer: Medicare Other

## 2022-01-09 ENCOUNTER — Encounter: Payer: Self-pay | Admitting: Student

## 2022-01-09 ENCOUNTER — Other Ambulatory Visit: Payer: Self-pay

## 2022-01-09 VITALS — BP 114/63 | HR 92 | Wt 157.8 lb

## 2022-01-09 DIAGNOSIS — H938X1 Other specified disorders of right ear: Secondary | ICD-10-CM | POA: Diagnosis not present

## 2022-01-09 DIAGNOSIS — I951 Orthostatic hypotension: Secondary | ICD-10-CM

## 2022-01-09 NOTE — Patient Instructions (Signed)
It was great to see you! Thank you for allowing me to participate in your care!  I recommend that you always bring your medications to each appointment as this makes it easy to ensure you are on the correct medications and helps [place] not miss when refills are needed.  Our plans for today:  -Stop taking the atenolol but continue to take the hydrochlorothiazide for your blood pressure -When going from lying to sitting to standing move very slowly and give your body time to adjust.  Always make sure you have something to hold onto. -Return for a blood pressure check in 2 weeks  Take care and seek immediate care sooner if you develop any concerns.   Dr. Precious Gilding, DO [facility]

## 2022-01-09 NOTE — Progress Notes (Signed)
    SUBJECTIVE:   CHIEF COMPLAINT / HPI:   R ear sensation Pt describes strange bubbling sound in R ear when she swallows like there is fluid for past 3-4 weeks. She denies pain or decreased hearing.   Dizziness Sometimes feels dizzy when she stands up, feels better after sitting down for a few minutes. She has had vertigo in the past but this is not near as bad. No headache, no change in vision.  PERTINENT  PMH / PSH: HTN  OBJECTIVE:   BP 114/63   Pulse 92   Wt 157 lb 12.8 oz (71.6 kg)   SpO2 100%   BMI 25.86 kg/m    General: NAD, pleasant, able to participate in exam HEENT: Unable to view left TM due to cerumen, right TM pearly gray with cone of light present and non bulging, no visible fluid behind right TM. Cardiac: RRR, no murmurs. Respiratory: CTAB, normal effort, No wheezes, rales or rhonchi Skin: warm and dry Neuro: alert, cranial nerves II through XII intact, sensation intact, finger-nose-finger test normal Psych: Normal affect and mood  ASSESSMENT/PLAN:   Orthostatic hypotension Neuroexam reassuring.  Orthostatic vitals were positive today for orthostatic hypotension which is likely causing her dizziness upon standing.  Patient advised to go from lying to sitting to standing very slowly and to always have something to hold onto.  I have discontinued her atenolol today but patient will remain on hydrochlorothiazide.  She was previously on atenolol for supraventricular tachycardia however she had an ablation done several years ago and this will likely not be an issue.  -Return in 2 weeks for BP check  Abnormal ear sensation, right Unsure was causing the strange bubbling sound in her right ear, could possibly be buildup of fluid.  Will continue to monitor.  If still present or bothering her, can trial Flonase at next visit if desired.     Dr. Precious Gilding, [place]

## 2022-01-09 NOTE — Assessment & Plan Note (Addendum)
Neuroexam reassuring.  Orthostatic vitals were positive today for orthostatic hypotension which is likely causing her dizziness upon standing.  Patient advised to go from lying to sitting to standing very slowly and to always have something to hold onto.  I have discontinued her atenolol today but patient will remain on hydrochlorothiazide.  She was previously on atenolol for supraventricular tachycardia however she had an ablation done several years ago and this will likely not be an issue.  -Return in 2 weeks for BP check

## 2022-01-09 NOTE — Assessment & Plan Note (Addendum)
Unsure was causing the strange bubbling sound in her right ear, could possibly be buildup of fluid.  Will continue to monitor.  If still present or bothering her, can trial Flonase at next visit if desired.

## 2022-01-13 DIAGNOSIS — Z1231 Encounter for screening mammogram for malignant neoplasm of breast: Secondary | ICD-10-CM | POA: Diagnosis not present

## 2022-01-15 ENCOUNTER — Other Ambulatory Visit: Payer: Self-pay | Admitting: Student

## 2022-01-15 DIAGNOSIS — E0840 Diabetes mellitus due to underlying condition with diabetic neuropathy, unspecified: Secondary | ICD-10-CM

## 2022-01-16 DIAGNOSIS — H1045 Other chronic allergic conjunctivitis: Secondary | ICD-10-CM | POA: Diagnosis not present

## 2022-01-16 DIAGNOSIS — J4520 Mild intermittent asthma, uncomplicated: Secondary | ICD-10-CM | POA: Diagnosis not present

## 2022-01-16 DIAGNOSIS — J3089 Other allergic rhinitis: Secondary | ICD-10-CM | POA: Diagnosis not present

## 2022-01-16 DIAGNOSIS — K219 Gastro-esophageal reflux disease without esophagitis: Secondary | ICD-10-CM | POA: Diagnosis not present

## 2022-01-26 ENCOUNTER — Ambulatory Visit (INDEPENDENT_AMBULATORY_CARE_PROVIDER_SITE_OTHER): Payer: Medicare Other | Admitting: Student

## 2022-01-26 ENCOUNTER — Encounter: Payer: Self-pay | Admitting: Student

## 2022-01-26 VITALS — BP 120/80 | HR 107 | Ht 66.0 in | Wt 160.6 lb

## 2022-01-26 DIAGNOSIS — G8929 Other chronic pain: Secondary | ICD-10-CM

## 2022-01-26 DIAGNOSIS — I10 Essential (primary) hypertension: Secondary | ICD-10-CM | POA: Diagnosis not present

## 2022-01-26 DIAGNOSIS — M5440 Lumbago with sciatica, unspecified side: Secondary | ICD-10-CM | POA: Diagnosis not present

## 2022-01-26 MED ORDER — HYDROCHLOROTHIAZIDE 12.5 MG PO TABS: 12.5000 mg | ORAL_TABLET | Freq: Every day | ORAL | 3 refills | Status: DC

## 2022-01-26 MED ORDER — TRAMADOL HCL 50 MG PO TABS: ORAL_TABLET | ORAL | 0 refills | Status: DC

## 2022-01-26 NOTE — Patient Instructions (Addendum)
It was great to see you! Thank you for allowing me to participate in your care!  I recommend that you always bring your medications to each appointment as this makes it easy to ensure you are on the correct medications and helps [place] not miss when refills are needed.  Our plans for today:  -Resume your atenolol.  - Reduce your hydrochlorothiazide to half (12.5 mg). Goal blood pressure is less than 140/90 - Return in 2 weeks for a follow up visit. We will check you blood pressure and see how your toe is doing.    Take care and seek immediate care sooner if you develop any concerns.   Dr. Precious Gilding, DO [facility]

## 2022-01-26 NOTE — Progress Notes (Signed)
    SUBJECTIVE:   CHIEF COMPLAINT / HPI:   Hypertension At last visit patient was diagnosed with orthostatic hypotension and she was taken off atenolol but continued on hydrochlorothiazide and valsartan.  She had previously been on atenolol for supraventricular tachycardia but is s/p ablation.  Denies any palpitations or feeling like her heart is racing.    Leakage from [facility] Toe Patient complains of fluid leaking of the left great toe.  Denies any pain but has decreased sensation due to diabetes.   PERTINENT  PMH / PSH: T2DM, HTN, history of supraventricular tachycardia s/p ablation  OBJECTIVE:   Vitals:   01/26/22 1336 01/27/22 1327  BP: 120/80   Pulse: (!) 127 (!) 107  SpO2: 98%     General: NAD, pleasant, able to participate in exam Cardiac: Tachycardic, regular rhythm Respiratory: Breathing comfortably on room air Extremities: Band-Aid removed from left great toe with very small amount of yellow-tinged fluid collected, no actively draining fluid from lateral side of great toe.  No erythema or edema.  See photo. Neuro: alert, no obvious focal deficits Psych: Normal affect and mood   ASSESSMENT/PLAN:   Essential hypertension Due to patient being tachycardic in office today, will resume atenolol.  BP well-controlled, will decrease hydrochlorothiazide to 12.5 mg daily. -Patient to return in 2 weeks for follow-up visit   Leakage from left great toe This could be a paronychia although there is no erythema.  Can consider fungal infection.  Would not treat with antibiotics today as it does not appear infected.  Patient advised to continue wearing a Band-Aid, not to soak her feet, and return in 2 weeks for follow-up  Dr. Precious Gilding, [place]

## 2022-01-27 ENCOUNTER — Ambulatory Visit: Payer: Medicare Other | Admitting: Internal Medicine

## 2022-01-27 ENCOUNTER — Encounter: Payer: Self-pay | Admitting: Internal Medicine

## 2022-01-27 VITALS — BP 110/60 | HR 75 | Ht 66.0 in | Wt 161.0 lb

## 2022-01-27 DIAGNOSIS — R143 Flatulence: Secondary | ICD-10-CM

## 2022-01-27 DIAGNOSIS — K219 Gastro-esophageal reflux disease without esophagitis: Secondary | ICD-10-CM | POA: Diagnosis not present

## 2022-01-27 DIAGNOSIS — R142 Eructation: Secondary | ICD-10-CM | POA: Diagnosis not present

## 2022-01-27 DIAGNOSIS — R141 Gas pain: Secondary | ICD-10-CM | POA: Diagnosis not present

## 2022-01-27 DIAGNOSIS — F17200 Nicotine dependence, unspecified, uncomplicated: Secondary | ICD-10-CM

## 2022-01-27 MED ORDER — METRONIDAZOLE 250 MG PO TABS: 250.0000 mg | ORAL_TABLET | Freq: Three times a day (TID) | ORAL | 1 refills | Status: DC

## 2022-01-27 NOTE — Patient Instructions (Signed)
_______________________________________________________  If your blood pressure at your visit was 140/90 or greater, please contact your primary care physician to follow up on this.  _______________________________________________________  If you are age 70 or older, your body mass index should be between 23-30. Your Body mass index is 25.99 kg/m. If this is out of the aforementioned range listed, please consider follow up with your Primary Care Provider.  If you are age 5 or younger, your body mass index should be between 19-25. Your Body mass index is 25.99 kg/m. If this is out of the aformentioned range listed, please consider follow up with your Primary Care Provider.   ________________________________________________________  The [facility] providers would like to encourage you to use [facility] to communicate with providers for non-urgent requests or questions.  Due to long hold times on the telephone, sending your provider a message by [facility] may be a faster and more efficient way to get a response.  Please allow 48 business hours for a response.  Please remember that this is for non-urgent requests.  _______________________________________________________  We have sent the following medications to your pharmacy for you to pick up at your convenience:  Flaygl

## 2022-01-27 NOTE — Assessment & Plan Note (Signed)
Due to patient being tachycardic in office today, will resume atenolol.  BP well-controlled, will decrease hydrochlorothiazide to 12.5 mg daily. -Patient to return in 2 weeks for follow-up visit

## 2022-01-27 NOTE — Progress Notes (Signed)
HISTORY OF PRESENT ILLNESS:  [name] is a 72 y.o. female with past medical history as listed below who is sent today by her primary care provider at [facility] regarding "worsening GERD".  The patient has a history of GERD with normal endoscopy in 2015.  She was last seen in this office September 2021 regarding increased gas as manifested by flatus and borborygmi.  No alarm features.  She also has a history of adenomatous colon polyps with last colonoscopy 2019.  She is due for surveillance later this year.  For her symptom complex, she was prescribed metronidazole 250 mg 3 times daily for 10 days.  She thinks this may have helped.  She tells me that she has had worsening problems with belching and flatus over the past months.  She saw her PCP who increased pantoprazole to 40 mg twice daily.  First patient stated this did not help, that she said maybe it did help.  She is not having any classic reflux symptoms such as pyrosis or regurgitation.  Her big complaint is belching.  She continues to smoke.  Review of blood work from December 26, 2021 shows normal hemoglobin of 13.7.  Last hemoglobin A1c November 10, 2021 was 8.2  REVIEW OF SYSTEMS:  All non-GI ROS negative unless otherwise stated in the HPI except for anxiety  Past Medical History:  Diagnosis Date   Allergy    Anxiety    AR (allergic rhinitis)    Asthma    Cataract    CHF (congestive heart failure) (HCC)    Colon polyps    hyperplastic   Disturbance of skin sensation    facial paresthesia; left   DM2 (diabetes mellitus, type 2) (HCC)    Dyslipidemia    Fatty liver    GERD (gastroesophageal reflux disease)    Hemorrhoids    Hyperlipidemia    Hypertension    essential, benign    Insomnia    Menopausal syndrome    Palpitations    hx   Routine general medical examination at a health care facility    Screening for malignant neoplasm of the cervix    Skin lesion    SVT (supraventricular tachycardia)     Tobacco abuse    Weakness     Past Surgical History:  Procedure Laterality Date   ABDOMINAL HYSTERECTOMY  1990   For fibroids benign.  Still has ovaries   ablation for SVT  9/09   Dr. Lovena Le   APPENDECTOMY     CATARACT EXTRACTION, BILATERAL     CESAREAN SECTION     COLONOSCOPY  2009   dental extractions     HEMORRHOID BANDING  2014   w/Dr.Medoff   SIGMOIDOSCOPY  2004    Social History TENEA SENS  reports that she has been smoking cigarettes. She has been smoking an average of 1 pack per day. She has never used smokeless tobacco. She reports that she does not currently use alcohol. She reports that she does not use drugs.  family history includes Cancer in her mother; Coronary artery disease in an other family member; Diabetes in her brother, brother, and mother; Heart attack (age of onset: 50) in her father; Hypertension in her mother; Stomach cancer in her maternal aunt and mother; Stroke in her mother.  Allergies  Allergen Reactions   Alcohol Swabs [Isopropyl Alcohol] Nausea Only    Use peroxide instead   Penicillins Other (See Comments)    Arm swelling   Prednisone  Other (See Comments)    Could not sleep   Amoxicillin-Pot Clavulanate Other (See Comments)    She is unsure what reaction she had       PHYSICAL EXAMINATION: Vital signs: BP 110/60   Pulse 75   Ht '5\' 6"'$  (1.676 m)   Wt 161 lb (73 kg)   SpO2 99%   BMI 25.99 kg/m   Constitutional: generally well-appearing, no acute distress Psychiatric: alert and oriented x3, cooperative Eyes: extraocular movements intact, anicteric, conjunctiva pink Mouth: Mask Neck: supple no lymphadenopathy Cardiovascular: heart regular rate and rhythm, no murmur Lungs: clear to auscultation bilaterally Abdomen: soft, nontender, nondistended, no obvious ascites, no peritoneal signs, normal bowel sounds, no organomegaly Rectal: Omitted Extremities: no clubbing, cyanosis, or lower extremity edema bilaterally Skin: no  lesions on visible extremities Neuro: No focal deficits.  Cranial nerves intact  ASSESSMENT:  1.  Increased belching and flatus.  Question bacterial overgrowth.  Did respond to metronidazole in the past. 2.  Poorly controlled diabetes 3.  GERD.  Classic symptoms controlled with once or twice daily PPI.  Normal EGD May 2015 4.  History of adenomatous colon polyps.  Last colonoscopy July 2019 5.  Multiple medical problems 6.  Smoking.  Ongoing  PLAN:  1.  Stop smoking.  This can exacerbate bloating and belching 2.  Continue PPI.  Lowest dose to control classic reflux symptoms has been recommended 3.  Prescribe metronidazole 250 mg p.o. 3 times daily x 10 days.  Advised not to consume alcohol while taking this medication.  Refills provided should she find this helpful for on-demand use 4.  Better diabetic control 5.  Surveillance colonoscopy around July 2024.  She is aware 6.  Return to the care of her PCP Time of 40 minutes spent preparing to see the patient, reviewing outside data, obtaining comprehensive history, performing medically appropriate physical examination, counseling and educating the patient regarding the above listed issues, ordering medication, and documenting clinical information in the health record

## 2022-01-30 DIAGNOSIS — J3081 Allergic rhinitis due to animal (cat) (dog) hair and dander: Secondary | ICD-10-CM | POA: Diagnosis not present

## 2022-01-30 DIAGNOSIS — J301 Allergic rhinitis due to pollen: Secondary | ICD-10-CM | POA: Diagnosis not present

## 2022-01-30 DIAGNOSIS — J3089 Other allergic rhinitis: Secondary | ICD-10-CM | POA: Diagnosis not present

## 2022-02-03 ENCOUNTER — Ambulatory Visit: Payer: Medicare Other | Admitting: Student

## 2022-02-04 DIAGNOSIS — J3089 Other allergic rhinitis: Secondary | ICD-10-CM | POA: Diagnosis not present

## 2022-02-04 DIAGNOSIS — J3081 Allergic rhinitis due to animal (cat) (dog) hair and dander: Secondary | ICD-10-CM | POA: Diagnosis not present

## 2022-02-04 DIAGNOSIS — J301 Allergic rhinitis due to pollen: Secondary | ICD-10-CM | POA: Diagnosis not present

## 2022-02-06 NOTE — Progress Notes (Unsigned)
    SUBJECTIVE:   CHIEF COMPLAINT / HPI:   HTN Decreased HCTZ to 12.'5mg'$  at last visit d/t orthostatic hypotension. She is currently also taking atenolol (which she also needs for SVT) and valsartan 40 mg. SBP at home in 1-teens-130's.   T2DM Glipizide '10mg'$  daily, metformin 1000 mg BID. Hgb A1c increased from 8.2>9.1 today.  She is adamant that she does not wish to change her medications at this time.  She would prefer to stay on glipizide and metformin and attempt to change her diet and recheck A1c in 3 months.  Back and neck pain Takes tramadol BID to TID PRN, requesting refill, last refill 30 tablets was on 1/15.  Usually goes 1 to 2 months before refill but has been taking more regularly d/t chronic pain.  Has been taking tramadol since at least 2008 per chart review.    PERTINENT  PMH / PSH: HTN, T2DM, chronic pain  OBJECTIVE:   Vitals:   02/09/22 1410 02/09/22 1446  BP: 131/79   Pulse: (!) 111 98  SpO2: 100%     General: NAD, pleasant, able to participate in exam Cardiac: RRR, no murmurs. Respiratory: CTAB, normal effort, No wheezes, rales or rhonchi Extremities: no edema or cyanosis.  Filament testing done with sensation intact of bilateral feet, no drainage noted from right great toe as noted in previous visit Skin: warm and dry, no rashes noted Neuro: alert, no obvious focal deficits Psych: Normal affect and mood  ASSESSMENT/PLAN:   Essential hypertension Well controlled -Continue current regimen  T2DM (type 2 diabetes mellitus) ([place]) A1c not at goal.  Patient does not wish to change medications at this time, she was provided with handout on diabetic diet and would like to attempt to follow this diet and recheck A1c in 3 months.  Chronic low back pain Refill tramadol sent in.  Patient to return in about 2 weeks to further discuss chronic pain and tramadol use     Dr. Precious Gilding, [place]

## 2022-02-09 ENCOUNTER — Ambulatory Visit (INDEPENDENT_AMBULATORY_CARE_PROVIDER_SITE_OTHER): Payer: Medicare Other | Admitting: Student

## 2022-02-09 ENCOUNTER — Other Ambulatory Visit: Payer: Self-pay

## 2022-02-09 ENCOUNTER — Encounter: Payer: Self-pay | Admitting: Student

## 2022-02-09 VITALS — BP 131/79 | HR 98 | Ht 66.0 in | Wt 156.8 lb

## 2022-02-09 DIAGNOSIS — M5440 Lumbago with sciatica, unspecified side: Secondary | ICD-10-CM | POA: Diagnosis not present

## 2022-02-09 DIAGNOSIS — I10 Essential (primary) hypertension: Secondary | ICD-10-CM | POA: Diagnosis not present

## 2022-02-09 DIAGNOSIS — E1140 Type 2 diabetes mellitus with diabetic neuropathy, unspecified: Secondary | ICD-10-CM

## 2022-02-09 DIAGNOSIS — G8929 Other chronic pain: Secondary | ICD-10-CM

## 2022-02-09 DIAGNOSIS — E0840 Diabetes mellitus due to underlying condition with diabetic neuropathy, unspecified: Secondary | ICD-10-CM

## 2022-02-09 DIAGNOSIS — K219 Gastro-esophageal reflux disease without esophagitis: Secondary | ICD-10-CM | POA: Diagnosis not present

## 2022-02-09 LAB — POCT GLYCOSYLATED HEMOGLOBIN (HGB A1C): HbA1c, POC (controlled diabetic range): 9.1 % — AB (ref 0.0–7.0)

## 2022-02-09 MED ORDER — TRAMADOL HCL 50 MG PO TABS: ORAL_TABLET | ORAL | 0 refills | Status: DC

## 2022-02-09 MED ORDER — PANTOPRAZOLE SODIUM 40 MG PO TBEC: 80.0000 mg | DELAYED_RELEASE_TABLET | Freq: Every day | ORAL | 2 refills | Status: DC

## 2022-02-09 MED ORDER — PANTOPRAZOLE SODIUM 40 MG PO TBEC: 80.0000 mg | DELAYED_RELEASE_TABLET | Freq: Every day | ORAL | 0 refills | Status: DC

## 2022-02-09 NOTE — Patient Instructions (Signed)
It was great to see you! Thank you for allowing me to participate in your care!  Our plans for today:  -Continue current medications  - Return in 2 weeks for follow up visit  Take care and seek immediate care sooner if you develop any concerns.   Dr. Precious Gilding, DO [facility]

## 2022-02-10 NOTE — Assessment & Plan Note (Signed)
A1c not at goal.  Patient does not wish to change medications at this time, she was provided with handout on diabetic diet and would like to attempt to follow this diet and recheck A1c in 3 months.

## 2022-02-10 NOTE — Assessment & Plan Note (Signed)
Well-controlled.  Continue current regimen. 

## 2022-02-10 NOTE — Assessment & Plan Note (Signed)
Refill tramadol sent in.  Patient to return in about 2 weeks to further discuss chronic pain and tramadol use

## 2022-02-11 DIAGNOSIS — J301 Allergic rhinitis due to pollen: Secondary | ICD-10-CM | POA: Diagnosis not present

## 2022-02-11 DIAGNOSIS — J3081 Allergic rhinitis due to animal (cat) (dog) hair and dander: Secondary | ICD-10-CM | POA: Diagnosis not present

## 2022-02-11 DIAGNOSIS — J3089 Other allergic rhinitis: Secondary | ICD-10-CM | POA: Diagnosis not present

## 2022-02-16 ENCOUNTER — Other Ambulatory Visit: Payer: Self-pay | Admitting: Internal Medicine

## 2022-02-16 DIAGNOSIS — I10 Essential (primary) hypertension: Secondary | ICD-10-CM

## 2022-02-18 DIAGNOSIS — J301 Allergic rhinitis due to pollen: Secondary | ICD-10-CM | POA: Diagnosis not present

## 2022-02-18 DIAGNOSIS — J3081 Allergic rhinitis due to animal (cat) (dog) hair and dander: Secondary | ICD-10-CM | POA: Diagnosis not present

## 2022-02-18 DIAGNOSIS — J3089 Other allergic rhinitis: Secondary | ICD-10-CM | POA: Diagnosis not present

## 2022-02-25 ENCOUNTER — Other Ambulatory Visit (HOSPITAL_BASED_OUTPATIENT_CLINIC_OR_DEPARTMENT_OTHER): Payer: Self-pay

## 2022-02-25 ENCOUNTER — Other Ambulatory Visit: Payer: Self-pay | Admitting: Student

## 2022-02-25 DIAGNOSIS — I10 Essential (primary) hypertension: Secondary | ICD-10-CM

## 2022-02-26 DIAGNOSIS — J3081 Allergic rhinitis due to animal (cat) (dog) hair and dander: Secondary | ICD-10-CM | POA: Diagnosis not present

## 2022-02-26 DIAGNOSIS — J3089 Other allergic rhinitis: Secondary | ICD-10-CM | POA: Diagnosis not present

## 2022-02-26 DIAGNOSIS — J301 Allergic rhinitis due to pollen: Secondary | ICD-10-CM | POA: Diagnosis not present

## 2022-03-02 NOTE — Telephone Encounter (Signed)
Open in error

## 2022-03-03 ENCOUNTER — Ambulatory Visit: Payer: Medicare Other | Admitting: Student

## 2022-03-05 DIAGNOSIS — J301 Allergic rhinitis due to pollen: Secondary | ICD-10-CM | POA: Diagnosis not present

## 2022-03-05 DIAGNOSIS — J3081 Allergic rhinitis due to animal (cat) (dog) hair and dander: Secondary | ICD-10-CM | POA: Diagnosis not present

## 2022-03-05 DIAGNOSIS — J3089 Other allergic rhinitis: Secondary | ICD-10-CM | POA: Diagnosis not present

## 2022-03-06 ENCOUNTER — Encounter: Payer: Self-pay | Admitting: Student

## 2022-03-06 ENCOUNTER — Ambulatory Visit (INDEPENDENT_AMBULATORY_CARE_PROVIDER_SITE_OTHER): Payer: Medicare Other | Admitting: Student

## 2022-03-06 ENCOUNTER — Other Ambulatory Visit: Payer: Self-pay

## 2022-03-06 VITALS — BP 127/78 | HR 79 | Ht 66.0 in | Wt 155.0 lb

## 2022-03-06 DIAGNOSIS — M5442 Lumbago with sciatica, left side: Secondary | ICD-10-CM

## 2022-03-06 DIAGNOSIS — G8929 Other chronic pain: Secondary | ICD-10-CM | POA: Diagnosis not present

## 2022-03-06 NOTE — Assessment & Plan Note (Addendum)
Pt agrees to sign a release form in attempt to get MRI records from chiropractor. Pt advised to take tylenol when she is doing an activity she knows will cause pain in an effort to mitigate the pain and only use tramadol for severe pain not relieved by the tylenol. Will continue to prescribe tramadol 50 mg #30 tablets up to once a month as needed.

## 2022-03-06 NOTE — Patient Instructions (Signed)
It was great to see you! Thank you for allowing me to participate in your care!   Our plans for today:  - Can increase Tylenol to 1000 mg every 6 hours on bad days. I recommend taking Tylenol when you are doing an activity which you know will cause pain. - Only take tramadol for severe pain. I will refill this medication up to once a month if needed - Return in 2 months for A1c    Take care and seek immediate care sooner if you develop any concerns.   Dr. Precious Gilding, DO [facility]
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
## 2022-03-12 DIAGNOSIS — J3081 Allergic rhinitis due to animal (cat) (dog) hair and dander: Secondary | ICD-10-CM | POA: Diagnosis not present

## 2022-03-12 DIAGNOSIS — J3089 Other allergic rhinitis: Secondary | ICD-10-CM | POA: Diagnosis not present

## 2022-03-12 DIAGNOSIS — J301 Allergic rhinitis due to pollen: Secondary | ICD-10-CM | POA: Diagnosis not present

## 2022-03-19 DIAGNOSIS — J3081 Allergic rhinitis due to animal (cat) (dog) hair and dander: Secondary | ICD-10-CM | POA: Diagnosis not present

## 2022-03-19 DIAGNOSIS — J301 Allergic rhinitis due to pollen: Secondary | ICD-10-CM | POA: Diagnosis not present

## 2022-03-19 DIAGNOSIS — J3089 Other allergic rhinitis: Secondary | ICD-10-CM | POA: Diagnosis not present

## 2022-03-25 ENCOUNTER — Ambulatory Visit: Payer: Medicare Other | Admitting: Podiatry

## 2022-03-25 ENCOUNTER — Encounter: Payer: Self-pay | Admitting: Podiatry

## 2022-03-25 VITALS — BP 117/67

## 2022-03-25 DIAGNOSIS — E1140 Type 2 diabetes mellitus with diabetic neuropathy, unspecified: Secondary | ICD-10-CM

## 2022-03-25 DIAGNOSIS — M79675 Pain in left toe(s): Secondary | ICD-10-CM

## 2022-03-25 DIAGNOSIS — B351 Tinea unguium: Secondary | ICD-10-CM | POA: Diagnosis not present

## 2022-03-25 DIAGNOSIS — M79674 Pain in right toe(s): Secondary | ICD-10-CM | POA: Diagnosis not present

## 2022-03-25 DIAGNOSIS — M2041 Other hammer toe(s) (acquired), right foot: Secondary | ICD-10-CM

## 2022-03-25 NOTE — Progress Notes (Signed)
Patient presents to the office today for diabetic shoe and insole measuring.  Patient was measured with brannock device to determine size and width for 1 pair of extra depth shoes and foam casted for 3 pair of insoles.   ABN signed.   Documentation of medical necessity will be sent to patient's treating diabetic doctor to verify and sign.   Patient's diabetic provider: Erick Alley, DO   Shoes and insoles will be ordered at that time and patient will be notified for an appointment for fitting when they arrive.   Brannock measurement:   Patient shoe selection-   1st   Shoe choice:     Shoe size ordered:   Seen by casting department

## 2022-03-25 NOTE — Progress Notes (Signed)
  Subjective:  Patient ID: [name], female    DOB: 02-06-1950,  MRN: [id number]  [name] presents to clinic today for at risk foot care with history of diabetic neuropathy and painful thick toenails that are difficult to trim. Pain interferes with ambulation. Aggravating factors include wearing enclosed shoe gear. Pain is relieved with periodic professional debridement.  Chief Complaint  Patient presents with   Nail Problem    [facility] BS-[id number] A1C-8 or 9 PCP-Jones, Sarah PCP VST-02/2022   New problem(s): None.   PCP is Precious Gilding, DO.  Allergies  Allergen Reactions   Alcohol Swabs [Isopropyl Alcohol] Nausea Only    Use peroxide instead   Penicillins Other (See Comments)    Arm swelling   Prednisone Other (See Comments)    Could not sleep   Amoxicillin-Pot Clavulanate Other (See Comments)    She is unsure what reaction she had    Review of Systems: Negative except as noted in the HPI.  Objective: No changes noted in today's physical examination. There were no vitals filed for this visit.  [name] is a pleasant 72 y.o. female WD, WN in NAD. AAO x 3.  Vascular Examination: Vascular status intact b/l with palpable pedal pulses. Pedal hair absent b/l. CFT immediate b/l. No edema. No pain with calf compression b/l. Skin temperature gradient WNL b/l.   Neurological Examination: Sensation grossly intact b/l with 10 gram monofilament. Vibratory sensation intact b/l.   Dermatological Examination: Pedal skin with normal turgor, texture and tone b/l. Toenails 1-5 b/l thick, discolored, elongated with subungual debris and pain on dorsal palpation. No hyperkeratotic lesions noted b/l.   Musculoskeletal Examination: Muscle strength 5/5 to b/l LE. Hallux hammertoe noted b/l LE. Hammertoe deformity noted 2-5 b/l.  Assessment/Plan: 1. Pain due to onychomycosis of toenails of both feet   2. Type 2 diabetes, controlled, with neuropathy ([place])    -Patient was  evaluated and treated. All patient's and/or POA's questions/concerns answered on today's visit. -Stressed the importance of good glycemic control and the detriment of not  controlling glucose levels in relation to the foot. -Continue supportive shoe gear daily. -Mycotic toenails 1-5 bilaterally were debrided in length and girth with sterile nail nippers and dremel without incident. -Patient/POA to call should there be question/concern in the interim.   Return in about 3 months (around 06/25/2022).  Marzetta Board, DPM

## 2022-03-26 DIAGNOSIS — J3089 Other allergic rhinitis: Secondary | ICD-10-CM | POA: Diagnosis not present

## 2022-03-26 DIAGNOSIS — J301 Allergic rhinitis due to pollen: Secondary | ICD-10-CM | POA: Diagnosis not present

## 2022-03-26 DIAGNOSIS — J3081 Allergic rhinitis due to animal (cat) (dog) hair and dander: Secondary | ICD-10-CM | POA: Diagnosis not present

## 2022-04-02 DIAGNOSIS — J301 Allergic rhinitis due to pollen: Secondary | ICD-10-CM | POA: Diagnosis not present

## 2022-04-02 DIAGNOSIS — J3089 Other allergic rhinitis: Secondary | ICD-10-CM | POA: Diagnosis not present

## 2022-04-06 ENCOUNTER — Telehealth: Payer: Self-pay | Admitting: Student

## 2022-04-06 NOTE — Telephone Encounter (Signed)
Contacted [name] to schedule their annual wellness visit. Appointment made for 04/13/2022.  Thank you,  [place] Direct dial  [phone]

## 2022-04-07 ENCOUNTER — Other Ambulatory Visit: Payer: Self-pay | Admitting: Student

## 2022-04-07 DIAGNOSIS — E0840 Diabetes mellitus due to underlying condition with diabetic neuropathy, unspecified: Secondary | ICD-10-CM

## 2022-04-07 DIAGNOSIS — J3089 Other allergic rhinitis: Secondary | ICD-10-CM | POA: Diagnosis not present

## 2022-04-07 DIAGNOSIS — J3081 Allergic rhinitis due to animal (cat) (dog) hair and dander: Secondary | ICD-10-CM | POA: Diagnosis not present

## 2022-04-07 DIAGNOSIS — J301 Allergic rhinitis due to pollen: Secondary | ICD-10-CM | POA: Diagnosis not present

## 2022-04-13 ENCOUNTER — Ambulatory Visit (INDEPENDENT_AMBULATORY_CARE_PROVIDER_SITE_OTHER): Payer: Medicare Other

## 2022-04-13 DIAGNOSIS — Z0000 Encounter for general adult medical examination without abnormal findings: Secondary | ICD-10-CM | POA: Diagnosis not present

## 2022-04-13 NOTE — Progress Notes (Signed)
I connected with  [name] on 04/13/22 by a audio enabled telemedicine application and verified that I am speaking with the correct person using two identifiers.  Patient Location: Home  Provider Location: Home Office  I discussed the limitations of evaluation and management by telemedicine. The patient expressed understanding and agreed to proceed.  Subjective:   [name] is a 72 y.o. female who presents for Medicare Annual (Subsequent) preventive examination.  Review of Systems    Per HPI unless specifically indicated below.  Cardiac Risk Factors include: advanced age (>43men, >76 women);female gender, Hypertension, and Hyperlipidemia.          Objective:       03/25/2022    4:18 PM 03/06/2022   10:02 AM 02/09/2022    2:46 PM  Vitals with BMI  Height  5\' 6"    Weight  155 lbs   BMI  99991111   Systolic [id number]   Diastolic 67 78   Pulse  79 98    Today's Vitals   04/13/22 1442  PainSc: 0-No pain   There is no height or weight on file to calculate BMI.     04/13/2022    2:57 PM 03/06/2022   10:03 AM 02/09/2022    2:11 PM 01/09/2022    2:36 PM 12/26/2021    9:22 AM 11/10/2021    1:35 PM 08/05/2021    1:26 PM  Advanced Directives  Does Patient Have a Medical Advance Directive? No No No No No No No  Would patient like information on creating a medical advance directive? No - Patient declined No - Patient declined No - Patient declined No - Patient declined No - Patient declined No - Patient declined No - Patient declined    Current Medications (verified) Outpatient Encounter Medications as of 04/13/2022  Medication Sig   Accu-Chek Softclix Lancets lancets USE AS DIRECTED 3 TIMES A DAY   acetaminophen (TYLENOL) 500 MG tablet Take 500 mg by mouth every 6 (six) hours as needed.   aspirin 81 MG chewable tablet Chew 1 tablet (81 mg total) by mouth daily.   atorvastatin (LIPITOR) 40 MG tablet Take 1 tablet by mouth once daily   cetirizine (ZYRTEC) 10 MG tablet  1 tablet as needed   citalopram (CELEXA) 20 MG tablet TAKE 1 TABLET BY MOUTH EVERY DAY   EPINEPHrine (EPIPEN 2-PAK) 0.3 mg/0.3 mL IJ SOAJ injection as directed Injection PRN for systemic reaction 30 days   glipiZIDE (GLUCOTROL) 10 MG tablet TAKE 1 TABLET BY MOUTH TWICE A DAY BEFORE A MEAL   hydrochlorothiazide (HYDRODIURIL) 12.5 MG tablet Take 1 tablet (12.5 mg total) by mouth daily.   ipratropium (ATROVENT) 0.03 % nasal spray 1-2 sprays each nostril   levalbuterol (XOPENEX HFA) 45 MCG/ACT inhaler Inhale 2 puffs into the lungs.   magnesium oxide (MAG-OX) 400 (240 Mg) MG tablet TAKE ONE TABLET BY MOUTH DAILY   meclizine (ANTIVERT) 25 MG tablet Take 1-2 tablets (25-50 mg total) by mouth 2 (two) times daily as needed for dizziness. if having symptoms.  May repeat in 12 hours later. (Patient taking differently: Take 25-50 mg by mouth 2 (two) times daily as needed for dizziness. if having symptoms.  May repeat in 12 hours later.)   metFORMIN (GLUCOPHAGE) 1000 MG tablet TAKE ONE TABLET BY MOUTH TWICE A DAY WITH A MEAL   Olopatadine HCl 0.6 % SOLN    ONETOUCH ULTRA test strip USE AS INSTRUCTED   pantoprazole (PROTONIX) 40 MG tablet  Take 2 tablets (80 mg total) by mouth daily.   Polyethylene Glycol 400 (BLINK TEARS OP)    potassium chloride (KLOR-CON) 10 MEQ tablet TAKE 1 TABLET BY MOUTH EVERY DAY   sucralfate (CARAFATE) 1 GM/10ML suspension Take 10 mLs (1 g total) by mouth 4 (four) times daily -  with meals and at bedtime.   traMADol (ULTRAM) 50 MG tablet Take 1 tablet by mouth 3 times daily as needed for low back pain.   Triamcinolone Acetonide (NASACORT AQ NA)    valsartan (DIOVAN) 40 MG tablet TAKE ONE TABLET BY MOUTH DAILY (Patient taking differently: Take 20 mg by mouth daily.)   metroNIDAZOLE (FLAGYL) 250 MG tablet Take 1 tablet (250 mg total) by mouth 3 (three) times daily. (Patient not taking: Reported on 04/13/2022)   No facility-administered encounter medications on file as of 04/13/2022.     Allergies (verified) Alcohol swabs [isopropyl alcohol], Penicillins, Prednisone, and Amoxicillin-pot clavulanate   History: Past Medical History:  Diagnosis Date   Allergy    Anxiety    AR (allergic rhinitis)    Asthma    Cataract    CHF (congestive heart failure)    Colon polyps    hyperplastic   Disturbance of skin sensation    facial paresthesia; left   DM2 (diabetes mellitus, type 2)    Dyslipidemia    Fatty liver    GERD (gastroesophageal reflux disease)    Hemorrhoids    Hyperlipidemia    Hypertension    essential, benign    Insomnia    Menopausal syndrome    Palpitations    hx   Routine general medical examination at a health care facility    Screening for malignant neoplasm of the cervix    Skin lesion    SVT (supraventricular tachycardia)    Tobacco abuse    Weakness    Past Surgical History:  Procedure Laterality Date   ABDOMINAL HYSTERECTOMY  1990   For fibroids benign.  Still has ovaries   ablation for SVT  9/09   Dr. Lovena Le   APPENDECTOMY     CATARACT EXTRACTION, BILATERAL     CESAREAN SECTION     COLONOSCOPY  2009   dental extractions     HEMORRHOID BANDING  2014   w/Dr.Medoff   SIGMOIDOSCOPY  2004   Family History  Problem Relation Age of Onset   Heart attack Father 43   Hypertension Mother    Stroke Mother    Diabetes Mother    Stomach cancer Mother    Cancer Mother    Diabetes Brother    Coronary artery disease Other        female, 1st degree relative <50   Stomach cancer Maternal Aunt    Diabetes Brother    Colon cancer Neg Hx    Esophageal cancer Neg Hx    Social History   Socioeconomic History   Marital status: Widowed    Spouse name: Not on file   Number of children: 3   Years of education: 47   Highest education level: Some college, no degree  Occupational History   Occupation: Retired    Comment: Dance movement psychotherapist  Tobacco Use   Smoking status: Every Day    Packs/day: .5    Types:  Cigarettes   Smokeless tobacco: Never  Vaping Use   Vaping Use: Never used  Substance and Sexual Activity   Alcohol use: Not Currently   Drug use: No   Sexual activity: Not Currently  Other Topics Concern   Not on file  Social History Narrative   Married, does not get regular exercise. On disability.    2 miscarriages, one at 6 and one at 5 months; death at 1 day, unsure of cause.       Health Care POA: on file   Emergency Contact: Jeanene Erb (friend) [phone] Terri Piedra (friend's daughter) [phone]   End of Life Plan: Gave pt pamphlet   Lives in 2 story apartment. Handrails on stairs. Smoke alarms, has throw rugs with backing on them. No grab bars in bathroom.   Who lives with you: husband   Any pets: 0   Diet: pt has varied diet. Eats vegetables, some fruit, and meat.   Exercise: pt is disabled and does not have a regular exercise routine   Seatbelts: Pt reports wearing seatbelt when in vehicle   Sun Exposure/Protection: Pt reports wearing a hat, sunglasses and facial sunscreen when outside   Hobbies: shopping    Social Determinants of Health   Financial Resource Strain: Low Risk  (04/13/2022)   Overall Financial Resource Strain (CARDIA)    Difficulty of Paying Living Expenses: Not hard at all  Food Insecurity: No Food Insecurity (04/13/2022)   Hunger Vital Sign    Worried About Running Out of Food in the Last Year: Never true    Ran Out of Food in the Last Year: Never true  Transportation Needs: No Transportation Needs (04/13/2022)   PRAPARE - Hydrologist (Medical): No    Lack of Transportation (Non-Medical): No  Physical Activity: Inactive (04/13/2022)   Exercise Vital Sign    Days of Exercise per Week: 0 days    Minutes of Exercise per Session: 0 min  Stress: No Stress Concern Present (04/13/2022)   [place]    Feeling of Stress : Not at all  Social Connections:  Moderately Isolated (04/13/2022)   Social Connection and Isolation Panel [NHANES]    Frequency of Communication with Friends and Family: More than three times a week    Frequency of Social Gatherings with Friends and Family: Twice a week    Attends Religious Services: Never    Marine scientist or Organizations: Yes    Attends Music therapist: More than 4 times per year    Marital Status: Widowed    Tobacco Counseling Ready to quit: Not Answered Counseling given: No   Clinical Intake:     Pain : No/denies pain Pain Score: 0-No pain     Nutritional Status: BMI of 19-24  Normal Nutritional Risks: None Diabetes: Yes CBG done?: No Did pt. bring in CBG monitor from home?: No  How often do you need to have someone help you when you read instructions, pamphlets, or other written materials from your doctor or pharmacy?: 1 - Never  Diabetic?Nutrition Risk Assessment:  Has the patient had any N/V/D within the last 2 months?  No  Does the patient have any non-healing wounds?  No  Has the patient had any unintentional weight loss or weight gain?  No   Diabetes:  Is the patient diabetic?  Yes  If diabetic, was a CBG obtained today?  Yes  Did the patient bring in their glucometer from home?  No  How often do you monitor your CBG's? Three a day .   Financial Strains and Diabetes Management:  Are you having any financial strains with the device, your supplies  or your medication? No .  Does the patient want to be seen by Chronic Care Management for management of their diabetes?  No  Would the patient like to be referred to a Nutritionist or for Diabetic Management?  No   Diabetic Exams:  Diabetic Eye Exam: Overdue for diabetic eye exam. Pt has been advised about the importance in completing this exam. Patient advised to call and schedule an eye exam. Diabetic Foot Exam: Completed 09/24/2021    Interpreter Needed?: No  Information entered by :: Donnie Mesa,  [place]   Activities of Daily Living    04/13/2022    2:41 PM  In your present state of health, do you have any difficulty performing the following activities:  Hearing? 0  Vision? 0  Comment 10/2021, [place] [place]  Difficulty concentrating or making decisions? 1  Walking or climbing stairs? 0  Dressing or bathing? 0  Doing errands, shopping? 0    Patient Care Team: Precious Gilding, DO as PCP - General (Family Medicine) Sharyne Peach, MD as Consulting Physician (Ophthalmology) Harold Hedge, Darrick Grinder, MD as Consulting Physician (Allergy and Immunology) Katy Apo, MD as Consulting Physician (Ophthalmology) Meylor, Marlou Sa, [place] (Chiropractic Medicine) Gardiner Barefoot, DPM as Consulting Physician (Podiatry) Marzetta Board, DPM as Consulting Physician (Podiatry)  Indicate any recent Medical Services you may have received from other than [facility] providers in the past year (date may be approximate).     Assessment:   This is a routine wellness examination for Haddy.  Hearing/Vision screen Denies any hearing issues. Denies any change to her vision. Wear glasses only for reading. Annual Eye Exam.   Dietary issues and exercise activities discussed: Current Exercise Habits: The patient does not participate in regular exercise at present, Exercise limited by: None identified   Goals Addressed   None    Depression Screen    04/13/2022    2:40 PM 03/06/2022   10:03 AM 02/09/2022    2:10 PM 01/09/2022    2:36 PM 12/26/2021    9:23 AM 11/10/2021    1:36 PM 08/05/2021    1:26 PM  PHQ 2/9 Scores  PHQ - 2 Score 1 1 1 1 1  0 1  PHQ- 9 Score  4 5 5 4 3 3     Fall Risk    04/13/2022    2:40 PM 03/06/2022   10:03 AM 02/09/2022    2:10 PM 01/09/2022    2:36 PM 12/26/2021    9:22 AM  Fall Risk   Falls in the past year? 1 0 0 0 0  Number falls in past yr: 1 0 0 0 0  Injury with Fall? 0 0 0 0 0  Risk for fall due to : No Fall Risks      Follow up Falls evaluation completed        FALL  RISK PREVENTION PERTAINING TO THE HOME:  Any stairs in or around the home? Yes  If so, are there any without handrails? No  Home free of loose throw rugs in walkways, pet beds, electrical cords, etc? Yes  Adequate lighting in your home to reduce risk of falls? Yes   ASSISTIVE DEVICES UTILIZED TO PREVENT FALLS:  Life alert? No  Use of a cane, walker or w/c? No  Grab bars in the bathroom? No  Shower chair or bench in shower? No  Elevated toilet seat or a handicapped toilet? No   TIMED UP AND GO:  Was the test performed? Unable to perform, virtual  appointment   Cognitive Function:    11/04/2017    3:45 PM 06/22/2013   10:00 AM  MMSE - Mini Mental State Exam  Orientation to time 5 5  Orientation to Place 5 5  Registration 3 3  Attention/ Calculation 5 5  Recall 3 3  Language- name 2 objects 2 2  Language- repeat 1 1  Language- follow 3 step command 3 3  Language- read & follow direction 1 1  Write a sentence 1 1  Copy design 1 1  Total score 30 30        11/04/2017    3:45 PM  6CIT Screen  What Year? 0 points  What month? 0 points  What time? 0 points  Count back from 20 0 points  Months in reverse 0 points  Repeat phrase 0 points  Total Score 0 points    Immunizations Immunization History  Administered Date(s) Administered   COVID-19, mRNA, vaccine(Comirnaty)12 years and older 10/22/2021   Fluad Quad(high Dose 65+) 09/26/2020, 10/22/2021   Influenza Whole 01/12/2005, 10/03/2007, 11/11/2012   Influenza, High Dose Seasonal PF 04/05/2013, 10/17/2013, 10/29/2015, 10/17/2018, 02/13/2020   Influenza,inj,Quad PF,6+ Mos 10/08/2014, 10/18/2017   Influenza-Unspecified 10/13/2015, 10/17/2018   PFIZER Comirnaty(Gray Top)Covid-19 Tri-Sucrose Vaccine 05/22/2020   PFIZER(Purple Top)SARS-COV-2 Vaccination 03/07/2019, 03/28/2019, 10/16/2019, 02/13/2020   Pfizer Covid-19 Vaccine Bivalent Booster 58yrs & up 11/18/2020, 02/03/2021   Pneumococcal Conjugate-13 10/18/2017    Pneumococcal Polysaccharide-23 10/12/2001, 09/09/2012, 06/02/2016, 03/16/2018, 11/27/2018, 05/10/2019, 07/03/2019, 02/13/2020   Respiratory Syncytial Virus Vaccine,Recomb Aduvanted(Arexvy) 10/13/2021   Td 03/15/2008   Tdap 04/13/2019   Zoster Recombinat (Shingrix) 01/20/2021    TDAP status: Up to date  Flu Vaccine status: Up to date  Pneumococcal vaccine status: Up to date  Covid-19 vaccine status: Completed vaccines  Qualifies for Shingles Vaccine? Yes   Zostavax completed No   Shingrix Completed?: No.    Education has been provided regarding the importance of this vaccine. Patient has been advised to call insurance company to determine out of pocket expense if they have not yet received this vaccine. Advised may also receive vaccine at local pharmacy or Health Dept. Verbalized acceptance and understanding.  Screening Tests Health Maintenance  Topic Date Due   Diabetic kidney evaluation - Urine ACR  Never done   OPHTHALMOLOGY EXAM  11/15/2020   Zoster Vaccines- Shingrix (2 of 2) 03/17/2021   COLONOSCOPY (Pts 45-39yrs Insurance coverage will need to be confirmed)  08/05/2022   HEMOGLOBIN A1C  08/10/2022   INFLUENZA VACCINE  08/13/2022   FOOT EXAM  09/25/2022   Diabetic kidney evaluation - eGFR measurement  12/27/2022   Medicare Annual Wellness (AWV)  04/13/2023   MAMMOGRAM  01/14/2024   DTaP/Tdap/Td (3 - Td or Tdap) 04/12/2029   Pneumonia Vaccine 63+ Years old  Completed   DEXA SCAN  Completed   COVID-19 Vaccine  Completed   Hepatitis C Screening  Completed   HPV VACCINES  Aged Out    Health Maintenance  Health Maintenance Due  Topic Date Due   Diabetic kidney evaluation - Urine ACR  Never done   OPHTHALMOLOGY EXAM  11/15/2020   Zoster Vaccines- Shingrix (2 of 2) 03/17/2021    Colorectal cancer screening: Type of screening: Colonoscopy. Completed 07/242019. Repeat every 5 years  Mammogram status: Completed 01/13/2022. Repeat every year  DEXA Scan: completed  07/14/2019  Lung Cancer Screening: (Low Dose CT Chest recommended if Age 53-80 years, 30 pack-year currently smoking OR have quit w/in 15years.) does not qualify.  Lung Cancer Screening Referral: not applicable   Additional Screening:  Hepatitis C Screening: does qualify; Completed 06/07/2013  Vision Screening: Recommended annual ophthalmology exams for early detection of glaucoma and other disorders of the eye. Is the patient up to date with their annual eye exam?  Yes  Who is the provider or what is the name of the office in which the patient attends annual eye exams? [facility]  If pt is not established with provider, would they like to be referred to a provider to establish care? No .   Dental Screening: Recommended annual dental exams for proper oral hygiene  Community Resource Referral / Chronic Care Management: CRR required this visit?  No   CCM required this visit?  No      Plan:     I have personally reviewed and noted the following in the patient's chart:   Medical and social history Use of alcohol, tobacco or illicit drugs  Current medications and supplements including opioid prescriptions. Patient is not currently taking opioid prescriptions. Functional ability and status Nutritional status Physical activity Advanced directives List of other physicians Hospitalizations, surgeries, and ER visits in previous 12 months Vitals Screenings to include cognitive, depression, and falls Referrals and appointments  In addition, I have reviewed and discussed with patient certain preventive protocols, quality metrics, and best practice recommendations. A written personalized care plan for preventive services as well as general preventive health recommendations were provided to patient.    Ms. Warrick , Thank you for taking time to come for your Medicare Wellness Visit. I appreciate your ongoing commitment to your health goals. Please review the following plan we  discussed and let me know if I can assist you in the future.   These are the goals we discussed:  Goals      Blood Pressure < 140/90     HEMOGLOBIN A1C < 7.0        This is a list of the screening recommended for you and due dates:  Health Maintenance  Topic Date Due   Yearly kidney health urinalysis for diabetes  Never done   Eye exam for diabetics  11/15/2020   Zoster (Shingles) Vaccine (2 of 2) 03/17/2021   Colon Cancer Screening  08/05/2022   Hemoglobin A1C  08/10/2022   Flu Shot  08/13/2022   Complete foot exam   09/25/2022   Yearly kidney function blood test for diabetes  12/27/2022   Medicare Annual Wellness Visit  04/13/2023   Mammogram  01/14/2024   DTaP/Tdap/Td vaccine (3 - Td or Tdap) 04/12/2029   Pneumonia Vaccine  Completed   DEXA scan (bone density measurement)  Completed   COVID-19 Vaccine  Completed   Hepatitis C Screening: USPSTF Recommendation to screen - Ages 2-79 yo.  Completed   HPV Vaccine  Aged [address] [place]   04/13/2022   Nurse Notes: Approximately 30 minute Non-Face -To-Face Medicare Wellness Visit

## 2022-04-13 NOTE — Patient Instructions (Signed)

## 2022-04-14 DIAGNOSIS — J301 Allergic rhinitis due to pollen: Secondary | ICD-10-CM | POA: Diagnosis not present

## 2022-04-14 DIAGNOSIS — J3089 Other allergic rhinitis: Secondary | ICD-10-CM | POA: Diagnosis not present

## 2022-04-15 ENCOUNTER — Other Ambulatory Visit: Payer: Self-pay | Admitting: Student

## 2022-04-15 DIAGNOSIS — E876 Hypokalemia: Secondary | ICD-10-CM

## 2022-04-15 DIAGNOSIS — J301 Allergic rhinitis due to pollen: Secondary | ICD-10-CM | POA: Diagnosis not present

## 2022-04-15 DIAGNOSIS — J3089 Other allergic rhinitis: Secondary | ICD-10-CM | POA: Diagnosis not present

## 2022-04-21 DIAGNOSIS — J3081 Allergic rhinitis due to animal (cat) (dog) hair and dander: Secondary | ICD-10-CM | POA: Diagnosis not present

## 2022-04-21 DIAGNOSIS — J301 Allergic rhinitis due to pollen: Secondary | ICD-10-CM | POA: Diagnosis not present

## 2022-04-21 DIAGNOSIS — J3089 Other allergic rhinitis: Secondary | ICD-10-CM | POA: Diagnosis not present

## 2022-04-24 ENCOUNTER — Telehealth: Payer: Self-pay | Admitting: Podiatry

## 2022-04-24 NOTE — Telephone Encounter (Signed)
Lmom for patient to call back to schedule picking up diabetic shoes    Expires 06/30/22

## 2022-04-27 NOTE — Telephone Encounter (Signed)
Lmom for patient to call back to schedule picking up diabetic shoes  

## 2022-04-30 DIAGNOSIS — J301 Allergic rhinitis due to pollen: Secondary | ICD-10-CM | POA: Diagnosis not present

## 2022-04-30 DIAGNOSIS — J3089 Other allergic rhinitis: Secondary | ICD-10-CM | POA: Diagnosis not present

## 2022-04-30 DIAGNOSIS — J3081 Allergic rhinitis due to animal (cat) (dog) hair and dander: Secondary | ICD-10-CM | POA: Diagnosis not present

## 2022-05-05 ENCOUNTER — Other Ambulatory Visit: Payer: Self-pay

## 2022-05-06 DIAGNOSIS — J3081 Allergic rhinitis due to animal (cat) (dog) hair and dander: Secondary | ICD-10-CM | POA: Diagnosis not present

## 2022-05-06 DIAGNOSIS — J301 Allergic rhinitis due to pollen: Secondary | ICD-10-CM | POA: Diagnosis not present

## 2022-05-06 DIAGNOSIS — J3089 Other allergic rhinitis: Secondary | ICD-10-CM | POA: Diagnosis not present

## 2022-05-07 ENCOUNTER — Other Ambulatory Visit: Payer: Self-pay | Admitting: Student

## 2022-05-07 ENCOUNTER — Telehealth: Payer: Self-pay

## 2022-05-07 DIAGNOSIS — I471 Supraventricular tachycardia, unspecified: Secondary | ICD-10-CM

## 2022-05-07 MED ORDER — ATENOLOL 25 MG PO TABS
25.0000 mg | ORAL_TABLET | Freq: Every day | ORAL | 3 refills | Status: DC
Start: 2022-05-07 — End: 2023-03-18

## 2022-05-07 NOTE — Telephone Encounter (Signed)
Patient calls nurse line regarding atenolol 25 mg prescription. She took last dose of medication on Sunday.   She reports that she was told that she needs her prescriptions filled at [facility] [address] per [employer].   Atenolol is not on current medication list. If appropriate, please send new prescription to [facility] on [address].   Veronda Prude, RN

## 2022-05-08 ENCOUNTER — Telehealth: Payer: Self-pay | Admitting: Student

## 2022-05-08 NOTE — Telephone Encounter (Signed)
Patient walked in and delivered her x-rays which was placed in the doctor's box

## 2022-05-12 ENCOUNTER — Ambulatory Visit (INDEPENDENT_AMBULATORY_CARE_PROVIDER_SITE_OTHER): Payer: Medicare Other | Admitting: Student

## 2022-05-12 ENCOUNTER — Encounter: Payer: Self-pay | Admitting: Student

## 2022-05-12 VITALS — BP 124/69 | HR 75 | Wt 154.0 lb

## 2022-05-12 DIAGNOSIS — E1140 Type 2 diabetes mellitus with diabetic neuropathy, unspecified: Secondary | ICD-10-CM

## 2022-05-12 DIAGNOSIS — I10 Essential (primary) hypertension: Secondary | ICD-10-CM

## 2022-05-12 DIAGNOSIS — J3089 Other allergic rhinitis: Secondary | ICD-10-CM | POA: Diagnosis not present

## 2022-05-12 DIAGNOSIS — M5440 Lumbago with sciatica, unspecified side: Secondary | ICD-10-CM | POA: Diagnosis not present

## 2022-05-12 DIAGNOSIS — M5442 Lumbago with sciatica, left side: Secondary | ICD-10-CM | POA: Diagnosis not present

## 2022-05-12 DIAGNOSIS — G8929 Other chronic pain: Secondary | ICD-10-CM

## 2022-05-12 DIAGNOSIS — K219 Gastro-esophageal reflux disease without esophagitis: Secondary | ICD-10-CM

## 2022-05-12 DIAGNOSIS — J301 Allergic rhinitis due to pollen: Secondary | ICD-10-CM | POA: Diagnosis not present

## 2022-05-12 DIAGNOSIS — J3081 Allergic rhinitis due to animal (cat) (dog) hair and dander: Secondary | ICD-10-CM | POA: Diagnosis not present

## 2022-05-12 LAB — POCT GLYCOSYLATED HEMOGLOBIN (HGB A1C): HbA1c, POC (controlled diabetic range): 7.9 % — AB (ref 0.0–7.0)

## 2022-05-12 MED ORDER — TRAMADOL HCL 50 MG PO TABS: ORAL_TABLET | ORAL | 0 refills | Status: DC

## 2022-05-12 NOTE — Assessment & Plan Note (Addendum)
BP initially 102/60 today; pt denies orthostatic symptoms. Pt then had normal orthostatic readings. Currently on HCTZ 12.5 mg daily, atenolol 25 mg daily (needed for SVT control), valsartan 20 mg daily. - Discussed option of discontinuing HCTZ with pt; shared decision making resulted in plan to monitor BPs at home for a week and call with those readings; will decide on continuing HCTZ at that time - For now, continue current regimen

## 2022-05-12 NOTE — Assessment & Plan Note (Signed)
Pt requests refill of tramadol which is appropriate. She did recently bring in x-rays from chiropractors office which I will review and upload into chart.

## 2022-05-12 NOTE — Progress Notes (Unsigned)
SUBJECTIVE:   CHIEF COMPLAINT / HPI:   [name] is a 72 y.o. female who presents today for follow-up of DM. Also reports some troubles with indigestion.  T2DM Currently taking glipizide 10 mg daily, metformin 1000 mg twice daily.  Last A1c in January of 9.1.  Patient elected to not change medications and attempt to change her diet.   She checks her blood sugars at home and reports sugars as low as 68 in the morning, as high as 140; reports usually in the 80-120. Primarily eating small meals throughout the day; reports she is eating out for every meal. Cut down on sodas, now drinking one a day from three a day. Also cut down on the amount of desserts she's been.  Reports last ophthalmologist visit was in 11/2021.  HTN Currently takes HCTZ 12.5 mg daily (previously decreased for orthostatic hypotension), atenolol 25 mg daily (needed for SVT control), valsartan 20 mg daily. BP low today at 102/60. Reports she does not drink much water. Denies light headedness, syncope recently. Reports sleepiness, naps most days. Denies changes in vision, HA, CP/SOB. Reports a fall a month ago due to mechanical reason.  GERD Longstanding history of indigestion, gas. Reports this worsened around summer 2023 and is no longer as well controlled on Protonix 80 mg daily and sucralfate 1 g four times a day.   She reports colonoscopy upcoming in the summer  PERTINENT  PMH / PSH: T2DM, HTN, HLD, SVT, GERD, chronic pain  OBJECTIVE:   BP 102/60   Pulse 75   Wt 154 lb (69.9 kg)   SpO2 98%   BMI 24.86 kg/m   Orthostatics: Supine: 130/76, 80 bpm; Seated: 119/68, 79 bpm; Standing: 124/69, 88 bpm General: Pt is seated comfortably in chair, no acute distress noted. Cardiovascular: RRR, no murmurs, rubs, gallops. Pulmonary: Normal work of breathing. Lungs clear to auscultation bilaterally. Abdomen: Normal bowel sounds. Soft and nondistended in all four quadrants. No tenderness to palpation; no rebound  tenderness, no guarding. Neuro/Psych: A&Ox3. Normal affect.  Ha1c (05/12/22): 7.9%  ASSESSMENT/PLAN:   T2DM (type 2 diabetes mellitus) (HCC) Ha1c today at 7.9%, improved from last reading of 9.1 in 01/24. Pt reports she has cut down on sugars and sodas in diet. Remains on glipizide 10 mg daily, metformin 1000 mg BID. - Pt encouraged to continue current dietary modifications.  - Continue monitoring blood glucose daily - Discussed switching glipizide to ozempic; pt does not want to change her med as she reports reading about unwanted side effects of ozempic. Pt cautioned on hypoglycemic precautions with continued use of glipizide - Continue current medications; will reassess Ha1c in 3 months  Essential hypertension BP initially 102/60 today; pt denies orthostatic symptoms. Pt then had normal orthostatic readings. Currently on HCTZ 12.5 mg daily, atenolol 25 mg daily (needed for SVT control), valsartan 20 mg daily. - Discussed option of discontinuing HCTZ with pt; shared decision making resulted in plan to monitor BPs at home for a week and call with those readings; will decide on continuing HCTZ at that time - For now, continue current regimen  GERD On Protonix 80 mg daily, sucralfate 1 g four times a day. Reports worsened indigestion starting summer 2023. - Do not want to increase/change meds at this time given pt is already on a high dose of Protonix - Food precautions + avoiding nighttime eating relayed to patient - Instructed pt to return if symptoms worsen  Chronic low back pain Pt requests refill of  tramadol which is appropriate. She did recently bring in x-rays from chiropractors office which I will review and upload into chart.      Governor Rooks, Medical Student [facility]

## 2022-05-12 NOTE — Assessment & Plan Note (Addendum)
Ha1c today at 7.9%, improved from last reading of 9.1 in 01/24. Pt reports she has cut down on sugars and sodas in diet. Remains on glipizide 10 mg daily, metformin 1000 mg BID. - Pt encouraged to continue current dietary modifications.  - Continue monitoring blood glucose daily - Discussed switching glipizide to ozempic; pt does not want to change her med as she reports reading about unwanted side effects of ozempic. Pt cautioned on hypoglycemic precautions with continued use of glipizide - Continue current medications; will reassess Ha1c in 3 months

## 2022-05-12 NOTE — Patient Instructions (Addendum)
It was great to see you! Thank you for allowing me to participate in your care!   Our plans for today:  -Continue checking your blood sugars daily.  If your sugars are below 70 and you feel bad, eat some candy or drink some juice and let me know.  -If your acid reflux symptoms do not resolve or become worse, please return for follow-up appointment -continue current medications -Check blood pressure once a day for one week and send readings to me -Return in 3 months for a follow up visit or sooner if needed   Orthostatic hypotension lifestyle changes: -avoid dehydration. Often it requires high volumes of fluids, often with salt/electrolytes included, to stay hydrated. People with orthostasis are very sensitive to fluid shifts and dehydration. Oral rehydration is preferred, and routine use of IV fluids is not recommended. -if tolerated, compression stocking can assist with fluid management and prevent pooling in the legs. -slow position changes are recommended -if there is a feeling of severe lightheadedness, like near to passing out, recommend lying on the floor on the back, with legs elevated up on a chair or up against the wall. -the best long term management is gradual exercise conditioning. I recommend seated exercises such as bike to start, to avoid the risk of falling with lightheadedness. Exercise programs, either through supervised programs like cardiac rehab or through personal programs, should focus on gradually increasing exercise tolerance and conditioning.     Dr. Erick Alley, DO [facility]

## 2022-05-12 NOTE — Assessment & Plan Note (Signed)
On Protonix 80 mg daily, sucralfate 1 g four times a day. Reports worsened indigestion starting summer 2023. - Do not want to increase/change meds at this time given pt is already on a high dose of Protonix - Food precautions + avoiding nighttime eating relayed to patient - Instructed pt to return if symptoms worsen

## 2022-05-13 ENCOUNTER — Other Ambulatory Visit: Payer: Medicare Other

## 2022-05-13 ENCOUNTER — Encounter: Payer: Self-pay | Admitting: Student

## 2022-05-13 MED ORDER — VALSARTAN 40 MG PO TABS: 20.0000 mg | ORAL_TABLET | Freq: Every day | ORAL | Status: DC

## 2022-05-19 DIAGNOSIS — J3089 Other allergic rhinitis: Secondary | ICD-10-CM | POA: Diagnosis not present

## 2022-05-19 DIAGNOSIS — J301 Allergic rhinitis due to pollen: Secondary | ICD-10-CM | POA: Diagnosis not present

## 2022-05-19 DIAGNOSIS — J3081 Allergic rhinitis due to animal (cat) (dog) hair and dander: Secondary | ICD-10-CM | POA: Diagnosis not present

## 2022-05-26 DIAGNOSIS — J301 Allergic rhinitis due to pollen: Secondary | ICD-10-CM | POA: Diagnosis not present

## 2022-05-26 DIAGNOSIS — J3089 Other allergic rhinitis: Secondary | ICD-10-CM | POA: Diagnosis not present

## 2022-05-26 DIAGNOSIS — J3081 Allergic rhinitis due to animal (cat) (dog) hair and dander: Secondary | ICD-10-CM | POA: Diagnosis not present

## 2022-06-02 ENCOUNTER — Other Ambulatory Visit: Payer: Self-pay | Admitting: Student

## 2022-06-02 DIAGNOSIS — J3081 Allergic rhinitis due to animal (cat) (dog) hair and dander: Secondary | ICD-10-CM | POA: Diagnosis not present

## 2022-06-02 DIAGNOSIS — J3089 Other allergic rhinitis: Secondary | ICD-10-CM | POA: Diagnosis not present

## 2022-06-02 DIAGNOSIS — I10 Essential (primary) hypertension: Secondary | ICD-10-CM

## 2022-06-02 DIAGNOSIS — J301 Allergic rhinitis due to pollen: Secondary | ICD-10-CM | POA: Diagnosis not present

## 2022-06-09 DIAGNOSIS — J301 Allergic rhinitis due to pollen: Secondary | ICD-10-CM | POA: Diagnosis not present

## 2022-06-09 DIAGNOSIS — J3081 Allergic rhinitis due to animal (cat) (dog) hair and dander: Secondary | ICD-10-CM | POA: Diagnosis not present

## 2022-06-09 DIAGNOSIS — J3089 Other allergic rhinitis: Secondary | ICD-10-CM | POA: Diagnosis not present

## 2022-06-16 ENCOUNTER — Other Ambulatory Visit (HOSPITAL_BASED_OUTPATIENT_CLINIC_OR_DEPARTMENT_OTHER): Payer: Self-pay

## 2022-06-16 DIAGNOSIS — K219 Gastro-esophageal reflux disease without esophagitis: Secondary | ICD-10-CM | POA: Diagnosis not present

## 2022-06-16 DIAGNOSIS — H1045 Other chronic allergic conjunctivitis: Secondary | ICD-10-CM | POA: Diagnosis not present

## 2022-06-16 DIAGNOSIS — J4520 Mild intermittent asthma, uncomplicated: Secondary | ICD-10-CM | POA: Diagnosis not present

## 2022-06-16 DIAGNOSIS — J3089 Other allergic rhinitis: Secondary | ICD-10-CM | POA: Diagnosis not present

## 2022-06-16 DIAGNOSIS — J019 Acute sinusitis, unspecified: Secondary | ICD-10-CM | POA: Diagnosis not present

## 2022-06-16 MED ORDER — AZITHROMYCIN 250 MG PO TABS
ORAL_TABLET | ORAL | 0 refills | Status: DC
  Filled 2022-06-16: qty 6, 5d supply, fill #0

## 2022-06-24 DIAGNOSIS — J3089 Other allergic rhinitis: Secondary | ICD-10-CM | POA: Diagnosis not present

## 2022-06-24 DIAGNOSIS — J301 Allergic rhinitis due to pollen: Secondary | ICD-10-CM | POA: Diagnosis not present

## 2022-06-24 DIAGNOSIS — J3081 Allergic rhinitis due to animal (cat) (dog) hair and dander: Secondary | ICD-10-CM | POA: Diagnosis not present

## 2022-06-25 ENCOUNTER — Encounter: Payer: Self-pay | Admitting: Internal Medicine

## 2022-06-27 ENCOUNTER — Other Ambulatory Visit: Payer: Self-pay | Admitting: Student

## 2022-06-27 DIAGNOSIS — E0840 Diabetes mellitus due to underlying condition with diabetic neuropathy, unspecified: Secondary | ICD-10-CM

## 2022-06-27 DIAGNOSIS — K219 Gastro-esophageal reflux disease without esophagitis: Secondary | ICD-10-CM

## 2022-06-30 DIAGNOSIS — J301 Allergic rhinitis due to pollen: Secondary | ICD-10-CM | POA: Diagnosis not present

## 2022-06-30 DIAGNOSIS — J3089 Other allergic rhinitis: Secondary | ICD-10-CM | POA: Diagnosis not present

## 2022-06-30 DIAGNOSIS — J3081 Allergic rhinitis due to animal (cat) (dog) hair and dander: Secondary | ICD-10-CM | POA: Diagnosis not present

## 2022-07-03 ENCOUNTER — Other Ambulatory Visit: Payer: Self-pay | Admitting: Student

## 2022-07-03 ENCOUNTER — Encounter: Payer: Self-pay | Admitting: Family Medicine

## 2022-07-03 ENCOUNTER — Ambulatory Visit (INDEPENDENT_AMBULATORY_CARE_PROVIDER_SITE_OTHER): Payer: Medicare Other | Admitting: Family Medicine

## 2022-07-03 VITALS — BP 109/73 | HR 81 | Ht 66.0 in | Wt 155.1 lb

## 2022-07-03 DIAGNOSIS — F411 Generalized anxiety disorder: Secondary | ICD-10-CM

## 2022-07-03 DIAGNOSIS — R42 Dizziness and giddiness: Secondary | ICD-10-CM

## 2022-07-03 DIAGNOSIS — K219 Gastro-esophageal reflux disease without esophagitis: Secondary | ICD-10-CM

## 2022-07-03 NOTE — Patient Instructions (Addendum)
It was nice seeing you today!  No changes to your medications today.  Try to write down your blood pressure readings and bring them in to your next visit.  Stay well, Littie Deeds, MD [facility] [phone]  --  Make sure to check out at the front desk before you leave today.  Please arrive at least 15 minutes prior to your scheduled appointments.  If you had blood work today, I will send you a MyChart message or a letter if results are normal. Otherwise, I will give you a call.  If you had a referral placed, they will call you to set up an appointment. Please give [place] a call if you don't hear back in the next 2 weeks.  If you need additional refills before your next appointment, please call your pharmacy first.  -- Blood Pressure Record Sheet To take your blood pressure, you will need a blood pressure machine. You can buy a blood pressure machine (blood pressure monitor) at your clinic, drug store, or online. When choosing one, consider: An automatic monitor that has an arm cuff. A cuff that wraps snugly around your upper arm. You should be able to fit only one finger between your arm and the cuff. A device that stores blood pressure reading results. Do not choose a monitor that measures your blood pressure from your wrist or finger. Follow your health care provider's instructions for how to take your blood pressure. To use this form: Take your blood pressure medications every day These measurements should be taken when you have been at rest for at least 10-15 min Take at least 2 readings with each blood pressure check. This makes sure the results are correct. Wait 1-2 minutes between measurements. Write down the results in the spaces on this form. Keep in mind it should always be recorded systolic over diastolic. Both numbers are important.  Repeat this every day for 2-3 weeks, or as told by your health care provider.  Make a follow-up appointment with  your health care provider to discuss the results.  Blood Pressure Log Date Medications taken? (Y/N) Blood Pressure Time of Day

## 2022-07-03 NOTE — Progress Notes (Signed)
    SUBJECTIVE:   CHIEF COMPLAINT / HPI:  Chief Complaint  Patient presents with   Dizziness    Reports episode of dizziness earlier today that lasted about 1-2 hours. This started about an hour after taking her morning medications. BP at that time was 127/78, P 70. Reports BP readings typically 130s-140s/70s-80s. Maybe started after standing up, but unsure.  Reports this was not a room spinning sensation.  She has had vertigo in the past and does not feel like this. She had an episode of leg cramping this morning as well, but this resolved. Denies chest pain, shortness of breath, palpitations. Reports symptoms are resolved now, back to normal.  PERTINENT  PMH / PSH: HTN, SVT, orthostatic hypotension, T2DM, atrial flutter s/p ablation  Patient Care Team: Erick Alley, DO as PCP - General (Family Medicine) Elise Benne, MD as Consulting Physician (Ophthalmology) Irena Cords, Enzo Montgomery, MD as Consulting Physician (Allergy and Immunology) Antony Contras, MD as Consulting Physician (Ophthalmology) Meylor, August Saucer, DC (Chiropractic Medicine) Helane Gunther, DPM as Consulting Physician (Podiatry) [name], DPM as Consulting Physician (Podiatry)   OBJECTIVE:   BP 109/73   Pulse 81   Ht 5\' 6"  (1.676 m)   Wt 155 lb 2 oz (70.4 kg)   SpO2 100%   BMI 25.04 kg/m   Physical Exam Constitutional:      General: She is not in acute distress. HENT:     Head: Normocephalic.  Eyes:     Extraocular Movements: Extraocular movements intact.     Conjunctiva/sclera: Conjunctivae normal.     Pupils: Pupils are equal, round, and reactive to light.  Cardiovascular:     Rate and Rhythm: Normal rate and regular rhythm.     Heart sounds: Normal heart sounds.  Pulmonary:     Effort: Pulmonary effort is normal. No respiratory distress.     Breath sounds: Normal breath sounds.  Musculoskeletal:     Cervical back: Neck supple.  Neurological:     General: No focal deficit present.     Mental  Status: She is alert.     Cranial Nerves: No cranial nerve deficit.     Sensory: No sensory deficit.     Motor: No weakness.     Gait: Gait normal.         07/03/2022    1:49 PM  Depression screen PHQ 2/9  Decreased Interest 1  Down, Depressed, Hopeless 0  PHQ - 2 Score 1  Altered sleeping 0  Tired, decreased energy 2  Change in appetite 1  Trouble concentrating 0  Moving slowly or fidgety/restless 0  Suicidal thoughts 0  PHQ-9 Score 4     {Show previous vital signs (optional):23777}    ASSESSMENT/PLAN:   1. Dizziness Patient with history of orthostatic hypotension presenting with brief episode of dizziness earlier today lasting about 1 to 2 hours, now resolved.  This has been an ongoing issue being addressed by her PCP.  I did discuss option of either discontinuing HCTZ versus continued observation, she would like to continue her current medications.  Advised her to keep a log of her BP and follow-up with her PCP next month as already scheduled.  Return if symptoms worsen or fail to improve.   Littie Deeds, MD [facility]

## 2022-07-06 ENCOUNTER — Encounter: Payer: Self-pay | Admitting: Internal Medicine

## 2022-07-15 DIAGNOSIS — J301 Allergic rhinitis due to pollen: Secondary | ICD-10-CM | POA: Diagnosis not present

## 2022-07-15 DIAGNOSIS — J3081 Allergic rhinitis due to animal (cat) (dog) hair and dander: Secondary | ICD-10-CM | POA: Diagnosis not present

## 2022-07-15 DIAGNOSIS — J3089 Other allergic rhinitis: Secondary | ICD-10-CM | POA: Diagnosis not present

## 2022-07-22 DIAGNOSIS — J3081 Allergic rhinitis due to animal (cat) (dog) hair and dander: Secondary | ICD-10-CM | POA: Diagnosis not present

## 2022-07-22 DIAGNOSIS — J301 Allergic rhinitis due to pollen: Secondary | ICD-10-CM | POA: Diagnosis not present

## 2022-07-22 DIAGNOSIS — J3089 Other allergic rhinitis: Secondary | ICD-10-CM | POA: Diagnosis not present

## 2022-07-24 ENCOUNTER — Other Ambulatory Visit: Payer: Self-pay | Admitting: Student

## 2022-07-24 DIAGNOSIS — E0840 Diabetes mellitus due to underlying condition with diabetic neuropathy, unspecified: Secondary | ICD-10-CM

## 2022-07-27 ENCOUNTER — Other Ambulatory Visit: Payer: Self-pay | Admitting: Student

## 2022-07-27 DIAGNOSIS — K219 Gastro-esophageal reflux disease without esophagitis: Secondary | ICD-10-CM

## 2022-07-29 ENCOUNTER — Telehealth: Payer: Self-pay

## 2022-07-29 DIAGNOSIS — J3089 Other allergic rhinitis: Secondary | ICD-10-CM | POA: Diagnosis not present

## 2022-07-29 DIAGNOSIS — J301 Allergic rhinitis due to pollen: Secondary | ICD-10-CM | POA: Diagnosis not present

## 2022-07-29 DIAGNOSIS — J3081 Allergic rhinitis due to animal (cat) (dog) hair and dander: Secondary | ICD-10-CM | POA: Diagnosis not present

## 2022-07-29 NOTE — Telephone Encounter (Signed)
No show for PV. Patient was called three times at two different numbers. No response. Message left three times to call and reschedule PV before 5:00 Pm today. If we don't hear from the patient a no show letter will be mailed at the end of the day and procedure will be cancelled per [facility] guidelines.

## 2022-07-30 ENCOUNTER — Other Ambulatory Visit: Payer: Self-pay | Admitting: Student

## 2022-07-30 ENCOUNTER — Encounter: Payer: Self-pay | Admitting: Internal Medicine

## 2022-07-30 DIAGNOSIS — G8929 Other chronic pain: Secondary | ICD-10-CM

## 2022-08-04 ENCOUNTER — Ambulatory Visit: Payer: Medicare Other | Admitting: Podiatry

## 2022-08-04 ENCOUNTER — Encounter: Payer: Self-pay | Admitting: Podiatry

## 2022-08-04 DIAGNOSIS — E1142 Type 2 diabetes mellitus with diabetic polyneuropathy: Secondary | ICD-10-CM

## 2022-08-04 DIAGNOSIS — M79674 Pain in right toe(s): Secondary | ICD-10-CM

## 2022-08-04 DIAGNOSIS — M79675 Pain in left toe(s): Secondary | ICD-10-CM

## 2022-08-04 DIAGNOSIS — B351 Tinea unguium: Secondary | ICD-10-CM | POA: Diagnosis not present

## 2022-08-06 ENCOUNTER — Encounter: Payer: Self-pay | Admitting: Student

## 2022-08-06 ENCOUNTER — Other Ambulatory Visit: Payer: Self-pay

## 2022-08-06 ENCOUNTER — Ambulatory Visit: Payer: Medicare Other | Admitting: Student

## 2022-08-06 VITALS — BP 141/64 | HR 63 | Ht 65.0 in | Wt 160.0 lb

## 2022-08-06 DIAGNOSIS — I10 Essential (primary) hypertension: Secondary | ICD-10-CM | POA: Diagnosis not present

## 2022-08-06 DIAGNOSIS — J3081 Allergic rhinitis due to animal (cat) (dog) hair and dander: Secondary | ICD-10-CM | POA: Diagnosis not present

## 2022-08-06 DIAGNOSIS — J3089 Other allergic rhinitis: Secondary | ICD-10-CM | POA: Diagnosis not present

## 2022-08-06 DIAGNOSIS — J301 Allergic rhinitis due to pollen: Secondary | ICD-10-CM | POA: Diagnosis not present

## 2022-08-06 DIAGNOSIS — E1140 Type 2 diabetes mellitus with diabetic neuropathy, unspecified: Secondary | ICD-10-CM | POA: Diagnosis not present

## 2022-08-06 LAB — POCT GLYCOSYLATED HEMOGLOBIN (HGB A1C): HbA1c, POC (controlled diabetic range): 7.6 % — AB (ref 0.0–7.0)

## 2022-08-06 NOTE — Assessment & Plan Note (Signed)
BP close to goal today at 141/64 off of HCTZ.  Still taking atenolol and valsartan.  Patient to continue this regimen, will check blood pressure at home 3-4 times per week, record readings and bring to next appointment in 3 months for A1c check.  Patient advised if blood pressure is consistently above 140/90 to return sooner.

## 2022-08-06 NOTE — Patient Instructions (Signed)
It was great to see you! Thank you for allowing me to participate in your care!  I recommend that you always bring your medications to each appointment as this makes it easy to ensure you are on the correct medications and helps [place] not miss when refills are needed.  Our plans for today:  -Continue atenolol and valsartan for blood pressure control.  Stay off of the hydrochlorothiazide.   -Check blood pressure 3-4 times per week.  Bring blood pressure readings to next appointment.   -If BP consistently at or below 140/90, will recheck with next A1c in 3 months. -If blood pressure consistently above 140/90, return sooner. -Return in 3 months for A1c   Take care and seek immediate care sooner if you develop any concerns.   Dr. Erick Alley, DO [facility]

## 2022-08-06 NOTE — Assessment & Plan Note (Signed)
A1c 7.6 today.  Patient prefers to stay on current regimen of metformin and glipizide.  Discussed cutting back on sodas, 1 every other day.  When she has a sweet tooth, discussed trying to eat whole fruits to satisfy craving.

## 2022-08-06 NOTE — Progress Notes (Signed)
    SUBJECTIVE:   CHIEF COMPLAINT / HPI:   T2DM Current meds include glipizide 10 mg BID and metformin 1000 mg BID. A1c on 05/12/22 of 7.9 and discussed d/c glipizide and starting ozempic which pt did not want to do. She preferred to continue with dietary modifications.  A1c 7.6 today. 1-1/5 sodas/day (down from 3).   HTN Last seen on 07/03/22 by Dr. Wynelle Link for dizziness which was contributed to likely known orthostatic hypotension as dizziness occurred with standing only.She is on atenolol (for SVT) and valsartan 40 mg daily.  Stopped hydrochlorothiazide 12.5 mg after last visit. Has not been checking BP at home.  Has not had any more dizzy spells.  Feeling well today.   PERTINENT  PMH / PSH: HTN, SVT, orthostatic hypotension, T2DM  OBJECTIVE:   BP (!) 141/64   Pulse 63   Ht 5\' 5"  (1.651 m)   Wt 160 lb (72.6 kg)   SpO2 100%   BMI 26.63 kg/m    General: NAD, pleasant, able to participate in exam Cardiac: Well-perfused Respiratory: Breathing comfortably on room air Skin: warm and dry Neuro: alert, no obvious focal deficits Psych: Normal affect and mood  ASSESSMENT/PLAN:   T2DM (type 2 diabetes mellitus) (HCC) A1c 7.6 today.  Patient prefers to stay on current regimen of metformin and glipizide.  Discussed cutting back on sodas, 1 every other day.  When she has a sweet tooth, discussed trying to eat whole fruits to satisfy craving.  Essential hypertension BP close to goal today at 141/64 off of HCTZ.  Still taking atenolol and valsartan.  Patient to continue this regimen, will check blood pressure at home 3-4 times per week, record readings and bring to next appointment in 3 months for A1c check.  Patient advised if blood pressure is consistently above 140/90 to return sooner.   Health maintenance Has apt for colonoscopy in September.  Has had 1 shingrix at [facility], will go get the second   Dr. Erick Alley, DO [facility]

## 2022-08-07 ENCOUNTER — Encounter: Payer: Self-pay | Admitting: Student

## 2022-08-10 NOTE — Progress Notes (Signed)
  Subjective:  Patient ID: [name], female    DOB: 04-13-1950,  MRN: [id number]  [name] presents to clinic today for at risk foot care with history of diabetic neuropathy  Chief Complaint  Patient presents with   Diabetes    [facility] BS - 125 A1C - 7.5 LVPCP - 04/2022   New problem(s): None.   PCP is Erick Alley, DO.  Allergies  Allergen Reactions   Alcohol Swabs [Isopropyl Alcohol] Nausea Only    Use peroxide instead   Penicillins Other (See Comments)    Arm swelling   Prednisone Other (See Comments)    Could not sleep   Amoxicillin-Pot Clavulanate Other (See Comments)    She is unsure what reaction she had    Review of Systems: Negative except as noted in the HPI.  Objective: No changes noted in today's physical examination. There were no vitals filed for this visit. [name] is a pleasant 72 y.o. female WD, WN in NAD. AAO x 3.  Vascular Examination: Capillary refill time immediate b/l. Vascular status intact b/l with palpable pedal pulses. Pedal hair present b/l. No pain with calf compression b/l. Skin temperature gradient WNL b/l. No cyanosis or clubbing b/l. No ischemia or gangrene noted b/l. No edema noted b/l LE.  Neurological Examination: Pt has subjective symptoms of neuropathy. Protective sensation decreased with 10 gram monofilament b/l.  Dermatological Examination: Pedal skin with normal turgor, texture and tone b/l.  No open wounds. No interdigital macerations.   Toenails 1-5 b/l thick, discolored, elongated with subungual debris and pain on dorsal palpation.   No corns, calluses nor porokeratotic lesions noted.  Musculoskeletal Examination: Normal muscle strength 5/5 to all lower extremity muscle groups bilaterally. Hammertoe deformity noted 1-5 b/l.Marland Kitchen No pain, crepitus or joint limitation noted with ROM b/l LE.  Patient ambulates independently without assistive aids.  Radiographs: None  Last A1c:      Latest Ref Rng & Units  08/06/2022    8:25 AM 05/12/2022    1:40 PM 02/09/2022    2:35 PM 11/10/2021    1:38 PM  Hemoglobin A1C  Hemoglobin-A1c 0.0 - 7.0 % 7.6  7.9  9.1  8.2    Assessment/Plan: 1. Pain due to onychomycosis of toenails of both feet   2. Diabetic peripheral neuropathy associated with type 2 diabetes mellitus (HCC)     -Consent given for treatment as described below: -Examined patient. -Stressed the importance of good glycemic control and the detriment of not  controlling glucose levels in relation to the foot. -Patient to continue soft, supportive shoe gear daily. -Toenails 1-5 b/l were debrided in length and girth with sterile nail nippers and dremel without iatrogenic bleeding.  -Patient/POA to call should there be question/concern in the interim.   Return in about 3 months (around 11/04/2022).  [name], DPM

## 2022-08-12 DIAGNOSIS — J3089 Other allergic rhinitis: Secondary | ICD-10-CM | POA: Diagnosis not present

## 2022-08-12 DIAGNOSIS — J3081 Allergic rhinitis due to animal (cat) (dog) hair and dander: Secondary | ICD-10-CM | POA: Diagnosis not present

## 2022-08-12 DIAGNOSIS — J301 Allergic rhinitis due to pollen: Secondary | ICD-10-CM | POA: Diagnosis not present

## 2022-08-17 ENCOUNTER — Other Ambulatory Visit (HOSPITAL_BASED_OUTPATIENT_CLINIC_OR_DEPARTMENT_OTHER): Payer: Self-pay

## 2022-08-17 DIAGNOSIS — J3081 Allergic rhinitis due to animal (cat) (dog) hair and dander: Secondary | ICD-10-CM | POA: Diagnosis not present

## 2022-08-17 DIAGNOSIS — J301 Allergic rhinitis due to pollen: Secondary | ICD-10-CM | POA: Diagnosis not present

## 2022-08-17 DIAGNOSIS — J3089 Other allergic rhinitis: Secondary | ICD-10-CM | POA: Diagnosis not present

## 2022-08-17 MED ORDER — ZOSTER VAC RECOMB ADJUVANTED 50 MCG/0.5ML IM SUSR
0.5000 mL | Freq: Once | INTRAMUSCULAR | 1 refills | Status: DC
  Filled 2022-08-17: qty 0.5, 1d supply, fill #0

## 2022-08-25 DIAGNOSIS — J3081 Allergic rhinitis due to animal (cat) (dog) hair and dander: Secondary | ICD-10-CM | POA: Diagnosis not present

## 2022-08-25 DIAGNOSIS — J3089 Other allergic rhinitis: Secondary | ICD-10-CM | POA: Diagnosis not present

## 2022-08-25 DIAGNOSIS — J301 Allergic rhinitis due to pollen: Secondary | ICD-10-CM | POA: Diagnosis not present

## 2022-08-27 ENCOUNTER — Encounter: Payer: Medicare Other | Admitting: Internal Medicine

## 2022-08-28 ENCOUNTER — Ambulatory Visit (AMBULATORY_SURGERY_CENTER): Payer: Medicare Other

## 2022-08-28 VITALS — Ht 65.0 in | Wt 160.0 lb

## 2022-08-28 DIAGNOSIS — Z86010 Personal history of colonic polyps: Secondary | ICD-10-CM

## 2022-08-28 MED ORDER — NA SULFATE-K SULFATE-MG SULF 17.5-3.13-1.6 GM/177ML PO SOLN: 1.0000 | Freq: Once | ORAL | 0 refills | Status: AC

## 2022-08-28 NOTE — Progress Notes (Signed)
Pre visit completed via phone call; Patient verified name, DOB, and address;  No egg or soy allergy known to patient; No issues known to pt with past sedation with any surgeries or procedures; Patient denies ever being told they had issues or difficulty with intubation;  No FH of Malignant Hyperthermia; Pt is not on diet pills; Pt is not on home 02;  Pt is not on blood thinners;  Pt denies issues with constipation;  No A fib or A flutter;  Have any cardiac testing pending--NO Insurance verified during PV appt--- [facility] Medicare  Pt can ambulate without assistance;  Pt denies use of chewing tobacco; Discussed diabetic/weight loss medication holds; Discussed NSAID holds; Checked BMI to be less than 50; Pt instructed to use [employer] or GoodRx for a price reduction on prep;   Pre visit completed and red dot placed by patient's name on their procedure day (on provider's schedule).    Instructions printed and mailed to the patient per her request;

## 2022-08-31 ENCOUNTER — Other Ambulatory Visit: Payer: Self-pay | Admitting: Student

## 2022-08-31 DIAGNOSIS — I10 Essential (primary) hypertension: Secondary | ICD-10-CM

## 2022-09-01 DIAGNOSIS — J301 Allergic rhinitis due to pollen: Secondary | ICD-10-CM | POA: Diagnosis not present

## 2022-09-01 DIAGNOSIS — J3089 Other allergic rhinitis: Secondary | ICD-10-CM | POA: Diagnosis not present

## 2022-09-01 DIAGNOSIS — J3081 Allergic rhinitis due to animal (cat) (dog) hair and dander: Secondary | ICD-10-CM | POA: Diagnosis not present

## 2022-09-08 ENCOUNTER — Encounter: Payer: Self-pay | Admitting: Internal Medicine

## 2022-09-09 DIAGNOSIS — J301 Allergic rhinitis due to pollen: Secondary | ICD-10-CM | POA: Diagnosis not present

## 2022-09-09 DIAGNOSIS — J3089 Other allergic rhinitis: Secondary | ICD-10-CM | POA: Diagnosis not present

## 2022-09-18 ENCOUNTER — Other Ambulatory Visit (HOSPITAL_BASED_OUTPATIENT_CLINIC_OR_DEPARTMENT_OTHER): Payer: Self-pay

## 2022-09-18 DIAGNOSIS — J3081 Allergic rhinitis due to animal (cat) (dog) hair and dander: Secondary | ICD-10-CM | POA: Diagnosis not present

## 2022-09-18 DIAGNOSIS — J301 Allergic rhinitis due to pollen: Secondary | ICD-10-CM | POA: Diagnosis not present

## 2022-09-18 DIAGNOSIS — J3089 Other allergic rhinitis: Secondary | ICD-10-CM | POA: Diagnosis not present

## 2022-09-22 DIAGNOSIS — J3089 Other allergic rhinitis: Secondary | ICD-10-CM | POA: Diagnosis not present

## 2022-09-22 DIAGNOSIS — J3081 Allergic rhinitis due to animal (cat) (dog) hair and dander: Secondary | ICD-10-CM | POA: Diagnosis not present

## 2022-09-22 DIAGNOSIS — J301 Allergic rhinitis due to pollen: Secondary | ICD-10-CM | POA: Diagnosis not present

## 2022-09-23 ENCOUNTER — Telehealth: Payer: Self-pay | Admitting: Internal Medicine

## 2022-09-23 NOTE — Telephone Encounter (Signed)
Patient had questions about her medications for the day of her procedure, we went over those and she understood. She also wanted to know the times of her cut off and if she had the correct information. She did in fact and we dicussed options for todays clear liquids. She agreed and will call back with any other questions.

## 2022-09-23 NOTE — Telephone Encounter (Signed)
PT is scheduled for a colonoscopy on tomorrow 8am and has some questions as far as her diabetic medication, whether she can take lorazepam and if she can have peppermints. Please advise.

## 2022-09-24 ENCOUNTER — Encounter: Payer: Self-pay | Admitting: Internal Medicine

## 2022-09-24 ENCOUNTER — Ambulatory Visit (AMBULATORY_SURGERY_CENTER): Payer: Medicare Other | Admitting: Internal Medicine

## 2022-09-24 VITALS — BP 136/84 | HR 90 | Temp 97.1°F | Resp 16 | Ht 65.0 in | Wt 160.0 lb

## 2022-09-24 DIAGNOSIS — Z86010 Personal history of colonic polyps: Secondary | ICD-10-CM | POA: Diagnosis not present

## 2022-09-24 DIAGNOSIS — Z09 Encounter for follow-up examination after completed treatment for conditions other than malignant neoplasm: Secondary | ICD-10-CM | POA: Diagnosis not present

## 2022-09-24 DIAGNOSIS — D123 Benign neoplasm of transverse colon: Secondary | ICD-10-CM

## 2022-09-24 DIAGNOSIS — E119 Type 2 diabetes mellitus without complications: Secondary | ICD-10-CM | POA: Diagnosis not present

## 2022-09-24 DIAGNOSIS — I10 Essential (primary) hypertension: Secondary | ICD-10-CM | POA: Diagnosis not present

## 2022-09-24 IMAGING — CR DG NECK SOFT TISSUE
2 series · 2 of 2 positions shown · non-contrast
Comparison: None.

CLINICAL DATA: Aspirated foreign body

EXAM:
NECK SOFT TISSUES - 1+ VIEW

[neck lat]
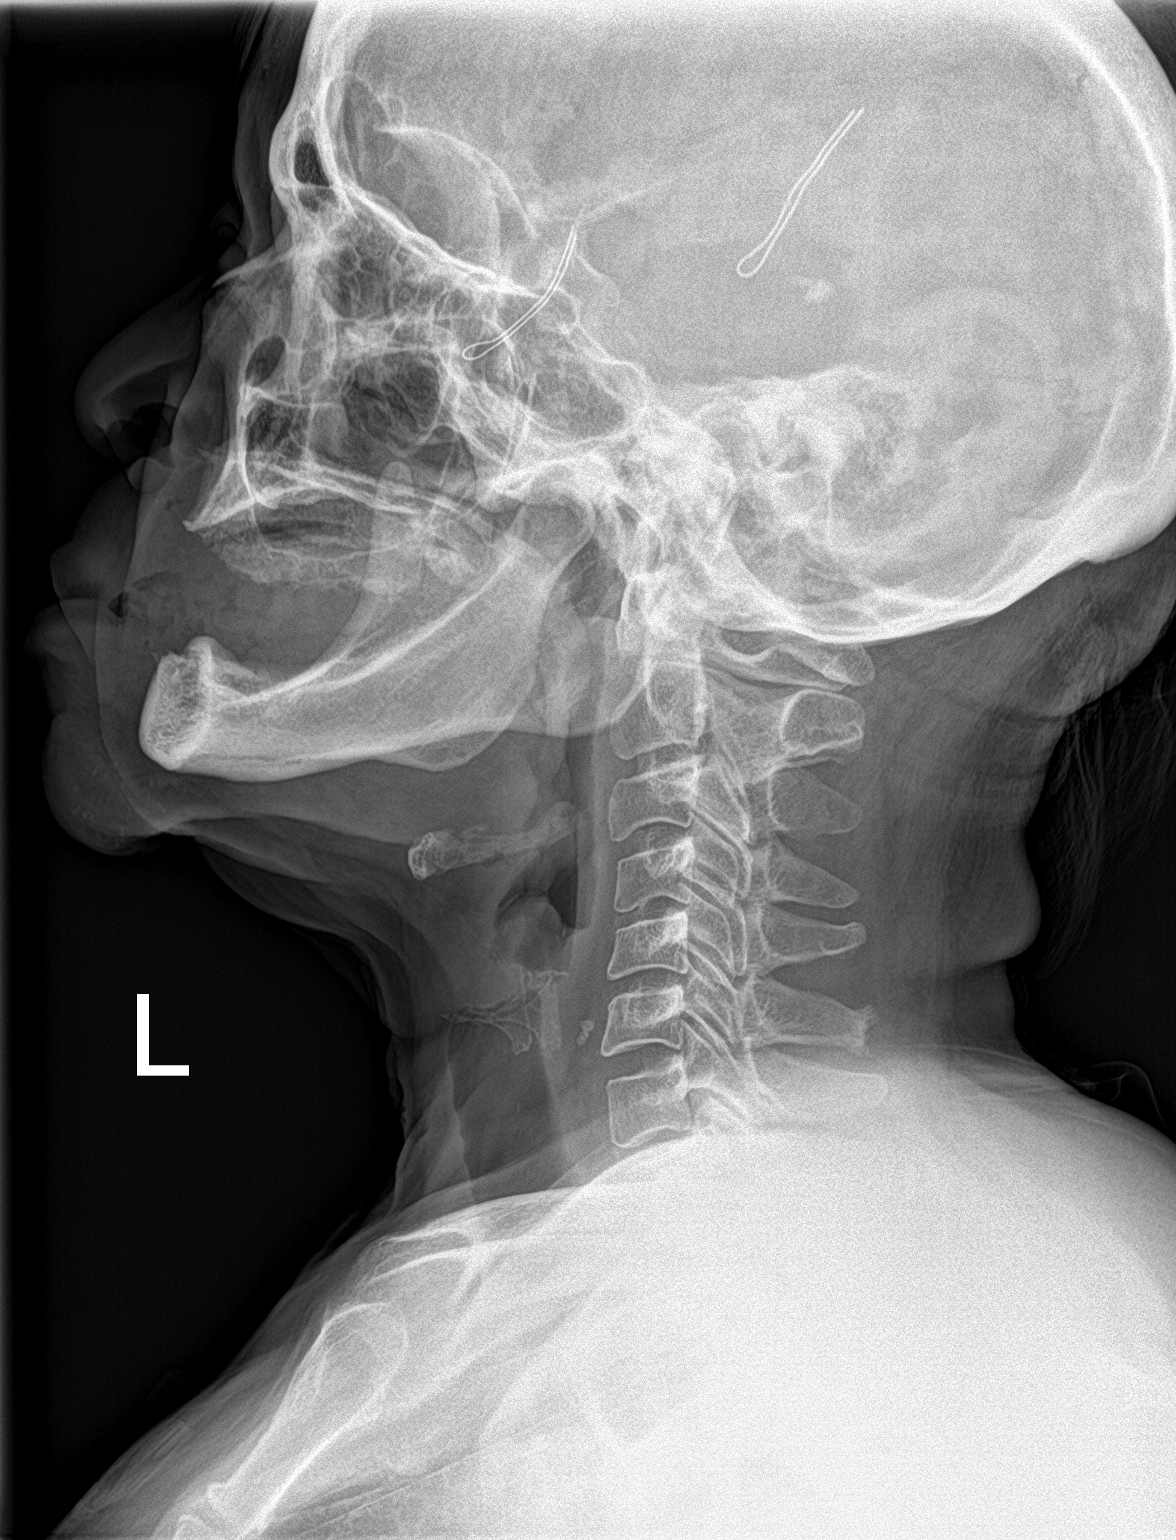

[neck ap]
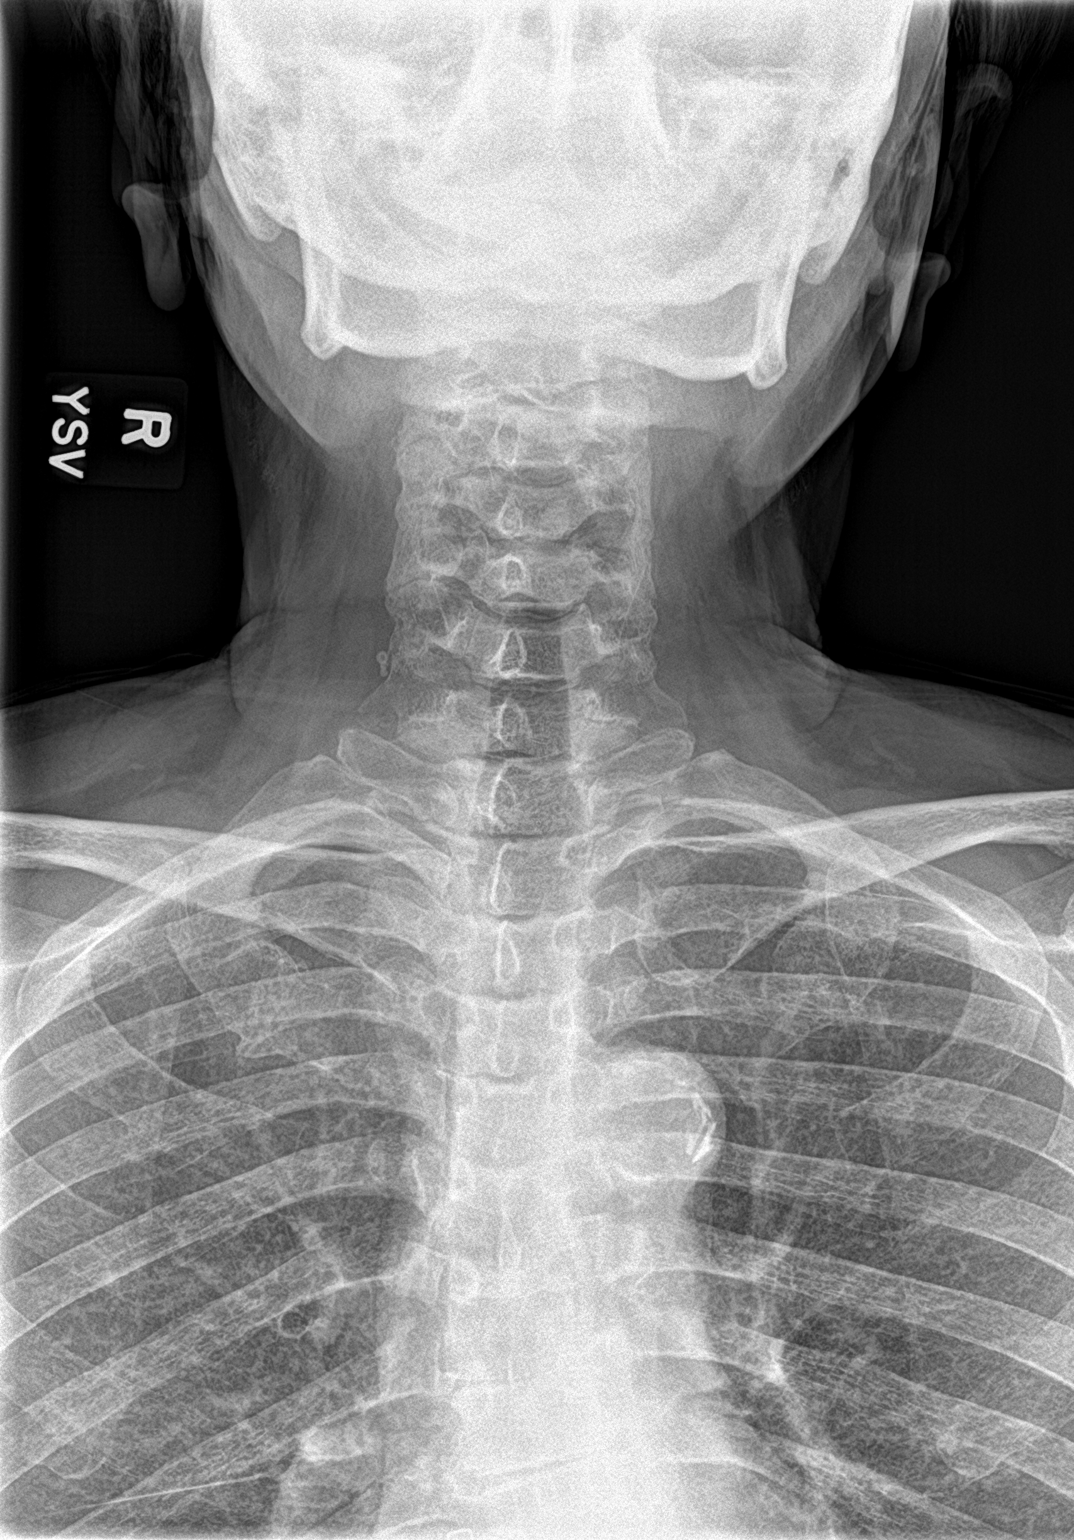

[2 of 2 positions shown; findings below may reference images not displayed]

FINDINGS: There is no evidence of retropharyngeal soft tissue swelling or
epiglottic enlargement. The cervical airway is unremarkable and no
radio-opaque foreign body identified. Stable phleboliths noted at
the right neck base.
IMPRESSION: Negative.

## 2022-09-24 MED ORDER — SODIUM CHLORIDE 0.9 % IV SOLN: 500.0000 mL | Freq: Once | INTRAVENOUS | Status: DC

## 2022-09-24 NOTE — Progress Notes (Signed)
Called to room to assist during endoscopic procedure.  Patient ID and intended procedure confirmed with present staff. Received instructions for my participation in the procedure from the performing physician.  

## 2022-09-24 NOTE — Op Note (Signed)
[facility] Patient Name: [name] Procedure Date: 09/24/2022 7:52 AM MRN: [id number] Endoscopist: Wilhemina Bonito. Marina Goodell , MD, [id number] Age: 72 Referring MD:  Date of Birth: 1950/03/19 Gender: Female Account #: [id number] Procedure:                Colonoscopy with cold snare polypectomy x 1 Indications:              High risk colon cancer surveillance: Personal                            history of multiple adenomas. Previous examinations                            2009, 2019 Medicines:                Monitored Anesthesia Care Procedure:                Pre-Anesthesia Assessment:                           - Prior to the procedure, a History and Physical                            was performed, and patient medications and                            allergies were reviewed. The patient's tolerance of                            previous anesthesia was also reviewed. The risks                            and benefits of the procedure and the sedation                            options and risks were discussed with the patient.                            All questions were answered, and informed consent                            was obtained. Prior Anticoagulants: The patient has                            taken no anticoagulant or antiplatelet agents. ASA                            Grade Assessment: II - A patient with mild systemic                            disease. After reviewing the risks and benefits,                            the patient was deemed in satisfactory condition to  undergo the procedure.                           After obtaining informed consent, the colonoscope                            was passed under direct vision. Throughout the                            procedure, the patient's blood pressure, pulse, and                            oxygen saturations were monitored continuously. The                            CF [id number]  #[id number] was introduced through the anus                            and advanced to the the cecum, identified by                            appendiceal orifice and ileocecal valve. The                            ileocecal valve, appendiceal orifice, and rectum                            were photographed. The quality of the bowel                            preparation was excellent. The colonoscopy was                            performed without difficulty. The patient tolerated                            the procedure well. The bowel preparation used was                            SUPREP via split dose instruction. Scope In: 8:48:06 AM Scope Out: 9:00:22 AM Scope Withdrawal Time: 0 hours 8 minutes 48 seconds  Total Procedure Duration: 0 hours 12 minutes 16 seconds  Findings:                 A 4 mm polyp was found in the transverse colon. The                            polyp was removed with a cold snare. Resection and                            retrieval were complete.                           The exam was otherwise without abnormality on  direct and retroflexion views. Complications:            No immediate complications. Estimated blood loss:                            None. Estimated Blood Loss:     Estimated blood loss: none. Impression:               - One 4 mm polyp in the transverse colon, removed                            with a cold snare. Resected and retrieved.                           - The examination was otherwise normal on direct                            and retroflexion views. Recommendation:           - Repeat colonoscopy in 5 years for surveillance                            (history of multiple polyps).                           - Patient has a contact number available for                            emergencies. The signs and symptoms of potential                            delayed complications were discussed with the                             patient. Return to normal activities tomorrow.                            Written discharge instructions were provided to the                            patient.                           - Resume previous diet.                           - Continue present medications.                           - Await pathology results. Wilhemina Bonito. Marina Goodell, MD 09/24/2022 9:16:12 AM This report has been signed electronically.

## 2022-09-24 NOTE — Progress Notes (Signed)
Pt's states no medical or surgical changes since previsit or office visit. 

## 2022-09-24 NOTE — Progress Notes (Signed)
HISTORY OF PRESENT ILLNESS:  [name] is a 72 y.o. female with a history of adenomatous colon polyps.  Presents today for surveillance colonoscopy.  No complaints  REVIEW OF SYSTEMS:  All non-GI ROS negative except for  Past Medical History:  Diagnosis Date   Allergy    Anxiety    AR (allergic rhinitis)    Asthma    on meds   Cataract    CHF (congestive heart failure) (HCC)    hx of   Colon polyps    hyperplastic   Disturbance of skin sensation    facial paresthesia; left   DM2 (diabetes mellitus, type 2) (HCC)    on meds   Dyslipidemia    Fatty liver    GERD (gastroesophageal reflux disease)    on meds   Hemorrhoids    Hyperlipidemia    on meds   Hypertension    essential, benign    Insomnia    Menopausal syndrome    Palpitations    hx   Routine general medical examination at a health care facility    Screening for malignant neoplasm of the cervix    Skin lesion    SVT (supraventricular tachycardia)    sx   Tobacco abuse    Weakness     Past Surgical History:  Procedure Laterality Date   ABDOMINAL HYSTERECTOMY  1990   For fibroids benign.  Still has ovaries   ablation for SVT  09/2007   Dr. Ladona Ridgel   APPENDECTOMY     CATARACT EXTRACTION, BILATERAL     CESAREAN SECTION     COLONOSCOPY  2019   JP-MAC-suprep(Exc)-TA -5 yr recall   dental extractions     HEMORRHOID BANDING  2014   w/Dr.Medoff   SIGMOIDOSCOPY  2004    Social History CRYSTOL ISENBARGER  reports that she has been smoking cigarettes. She has been exposed to tobacco smoke. She has never used smokeless tobacco. She reports that she does not currently use alcohol. She reports that she does not use drugs.  family history includes Cancer in her mother; Coronary artery disease in an other family member; Diabetes in her brother, brother, and mother; Heart attack (age of onset: 49) in her father; Hypertension in her mother; Stroke in her mother.  Allergies  Allergen Reactions   Alcohol  Swabs [Isopropyl Alcohol] Nausea Only    Use peroxide instead   Penicillins Other (See Comments)    Arm swelling   Prednisone Other (See Comments)    Could not sleep   Amoxicillin-Pot Clavulanate Other (See Comments)    She is unsure what reaction she had       PHYSICAL EXAMINATION: Vital signs: BP (!) 188/90   Pulse 92   Temp (!) 97.1 F (36.2 C) (Temporal)   Resp 11   Ht 5\' 5"  (1.651 m)   Wt 160 lb (72.6 kg)   SpO2 100%   BMI 26.63 kg/m  General: Well-developed, well-nourished, no acute distress HEENT: Sclerae are anicteric, conjunctiva pink. Oral mucosa intact Lungs: Clear Heart: Regular Abdomen: soft, nontender, nondistended, no obvious ascites, no peritoneal signs, normal bowel sounds. No organomegaly. Extremities: No edema Psychiatric: alert and oriented x3. Cooperative     ASSESSMENT:  History of adenomatous colon polyps   PLAN:   Surveillance colonoscopy

## 2022-09-24 NOTE — Progress Notes (Signed)
Sedate, gd SR, tolerated procedure well, VSS, report to RN 

## 2022-09-24 NOTE — Patient Instructions (Signed)
Resume previous diet and medications. Awaiting pathology results. Repeat Colonoscopy date to be determined based on pathology results.  YOU HAD AN ENDOSCOPIC PROCEDURE TODAY AT THE [facility]:   Refer to the procedure report that was given to you for any specific questions about what was found during the examination.  If the procedure report does not answer your questions, please call your gastroenterologist to clarify.  If you requested that your care partner not be given the details of your procedure findings, then the procedure report has been included in a sealed envelope for you to review at your convenience later.  YOU SHOULD EXPECT: Some feelings of bloating in the abdomen. Passage of more gas than usual.  Walking can help get rid of the air that was put into your GI tract during the procedure and reduce the bloating. If you had a lower endoscopy (such as a colonoscopy or flexible sigmoidoscopy) you may notice spotting of blood in your stool or on the toilet paper. If you underwent a bowel prep for your procedure, you may not have a normal bowel movement for a few days.  Please Note:  You might notice some irritation and congestion in your nose or some drainage.  This is from the oxygen used during your procedure.  There is no need for concern and it should clear up in a day or so.  SYMPTOMS TO REPORT IMMEDIATELY:  Following lower endoscopy (colonoscopy or flexible sigmoidoscopy):  Excessive amounts of blood in the stool  Significant tenderness or worsening of abdominal pains  Swelling of the abdomen that is new, acute  Fever of 100F or higher   For urgent or emergent issues, a gastroenterologist can be reached at any hour by calling [phone]. Do not use MyChart messaging for urgent concerns.    DIET:  We do recommend a small meal at first, but then you may proceed to your regular diet.  Drink plenty of fluids but you should avoid alcoholic beverages for 24  hours.  ACTIVITY:  You should plan to take it easy for the rest of today and you should NOT DRIVE or use heavy machinery until tomorrow (because of the sedation medicines used during the test).    FOLLOW UP: Our staff will call the number listed on your records the next business day following your procedure.  We will call around 7:15- 8:00 am to check on you and address any questions or concerns that you may have regarding the information given to you following your procedure. If we do not reach you, we will leave a message.     If any biopsies were taken you will be contacted by phone or by letter within the next 1-3 weeks.  Please call us at [phone] if you have not heard about the biopsies in 3 weeks.    SIGNATURES/CONFIDENTIALITY: You and/or your care partner have signed paperwork which will be entered into your electronic medical record.  These signatures attest to the fact that that the information above on your After Visit Summary has been reviewed and is understood.  Full responsibility of the confidentiality of this discharge information lies with you and/or your care-partner. 

## 2022-09-25 ENCOUNTER — Telehealth: Payer: Self-pay

## 2022-09-25 NOTE — Telephone Encounter (Signed)
Attempted to reach pt.for follow-up following procedure 09/24/2022.  LM on pt. Ans. Machine to call if she has any questions or concerns.

## 2022-09-28 ENCOUNTER — Other Ambulatory Visit (HOSPITAL_BASED_OUTPATIENT_CLINIC_OR_DEPARTMENT_OTHER): Payer: Self-pay

## 2022-09-28 ENCOUNTER — Encounter: Payer: Self-pay | Admitting: Internal Medicine

## 2022-09-28 LAB — SURGICAL PATHOLOGY

## 2022-09-28 MED ORDER — INFLUENZA VAC A&B SURF ANT ADJ 0.5 ML IM SUSY
0.5000 mL | PREFILLED_SYRINGE | Freq: Once | INTRAMUSCULAR | 0 refills | Status: AC
  Filled 2022-09-28: qty 0.5, 1d supply, fill #0

## 2022-09-28 MED ORDER — COVID-19 MRNA VAC-TRIS(PFIZER) 30 MCG/0.3ML IM SUSY
0.3000 mL | PREFILLED_SYRINGE | Freq: Once | INTRAMUSCULAR | 0 refills | Status: AC
  Filled 2022-09-28: qty 0.3, 1d supply, fill #0

## 2022-10-01 ENCOUNTER — Other Ambulatory Visit: Payer: Self-pay | Admitting: Student

## 2022-10-01 DIAGNOSIS — E0840 Diabetes mellitus due to underlying condition with diabetic neuropathy, unspecified: Secondary | ICD-10-CM

## 2022-10-02 DIAGNOSIS — J3081 Allergic rhinitis due to animal (cat) (dog) hair and dander: Secondary | ICD-10-CM | POA: Diagnosis not present

## 2022-10-02 DIAGNOSIS — J3089 Other allergic rhinitis: Secondary | ICD-10-CM | POA: Diagnosis not present

## 2022-10-02 DIAGNOSIS — J301 Allergic rhinitis due to pollen: Secondary | ICD-10-CM | POA: Diagnosis not present

## 2022-10-06 DIAGNOSIS — J3081 Allergic rhinitis due to animal (cat) (dog) hair and dander: Secondary | ICD-10-CM | POA: Diagnosis not present

## 2022-10-06 DIAGNOSIS — J301 Allergic rhinitis due to pollen: Secondary | ICD-10-CM | POA: Diagnosis not present

## 2022-10-06 DIAGNOSIS — J3089 Other allergic rhinitis: Secondary | ICD-10-CM | POA: Diagnosis not present

## 2022-10-13 DIAGNOSIS — J3089 Other allergic rhinitis: Secondary | ICD-10-CM | POA: Diagnosis not present

## 2022-10-13 DIAGNOSIS — J3081 Allergic rhinitis due to animal (cat) (dog) hair and dander: Secondary | ICD-10-CM | POA: Diagnosis not present

## 2022-10-13 DIAGNOSIS — J301 Allergic rhinitis due to pollen: Secondary | ICD-10-CM | POA: Diagnosis not present

## 2022-10-19 ENCOUNTER — Other Ambulatory Visit: Payer: Self-pay

## 2022-10-19 DIAGNOSIS — E876 Hypokalemia: Secondary | ICD-10-CM

## 2022-10-19 MED ORDER — POTASSIUM CHLORIDE ER 10 MEQ PO TBCR
10.0000 meq | EXTENDED_RELEASE_TABLET | Freq: Every day | ORAL | 1 refills | Status: DC
Start: 2022-10-19 — End: 2023-04-27

## 2022-10-20 DIAGNOSIS — J3089 Other allergic rhinitis: Secondary | ICD-10-CM | POA: Diagnosis not present

## 2022-10-20 DIAGNOSIS — J3081 Allergic rhinitis due to animal (cat) (dog) hair and dander: Secondary | ICD-10-CM | POA: Diagnosis not present

## 2022-10-20 DIAGNOSIS — J301 Allergic rhinitis due to pollen: Secondary | ICD-10-CM | POA: Diagnosis not present

## 2022-10-27 DIAGNOSIS — J3089 Other allergic rhinitis: Secondary | ICD-10-CM | POA: Diagnosis not present

## 2022-10-27 DIAGNOSIS — J301 Allergic rhinitis due to pollen: Secondary | ICD-10-CM | POA: Diagnosis not present

## 2022-10-27 DIAGNOSIS — J3081 Allergic rhinitis due to animal (cat) (dog) hair and dander: Secondary | ICD-10-CM | POA: Diagnosis not present

## 2022-11-03 DIAGNOSIS — J301 Allergic rhinitis due to pollen: Secondary | ICD-10-CM | POA: Diagnosis not present

## 2022-11-03 DIAGNOSIS — J3089 Other allergic rhinitis: Secondary | ICD-10-CM | POA: Diagnosis not present

## 2022-11-03 DIAGNOSIS — J3081 Allergic rhinitis due to animal (cat) (dog) hair and dander: Secondary | ICD-10-CM | POA: Diagnosis not present

## 2022-11-10 ENCOUNTER — Other Ambulatory Visit: Payer: Self-pay

## 2022-11-10 ENCOUNTER — Encounter: Payer: Self-pay | Admitting: Student

## 2022-11-10 ENCOUNTER — Ambulatory Visit: Payer: Medicare Other | Admitting: Student

## 2022-11-10 VITALS — BP 120/78 | HR 71 | Ht 65.0 in | Wt 161.0 lb

## 2022-11-10 DIAGNOSIS — Z23 Encounter for immunization: Secondary | ICD-10-CM | POA: Diagnosis not present

## 2022-11-10 DIAGNOSIS — E876 Hypokalemia: Secondary | ICD-10-CM | POA: Diagnosis not present

## 2022-11-10 DIAGNOSIS — I10 Essential (primary) hypertension: Secondary | ICD-10-CM

## 2022-11-10 DIAGNOSIS — Z7984 Long term (current) use of oral hypoglycemic drugs: Secondary | ICD-10-CM | POA: Diagnosis not present

## 2022-11-10 DIAGNOSIS — M5440 Lumbago with sciatica, unspecified side: Secondary | ICD-10-CM | POA: Diagnosis not present

## 2022-11-10 DIAGNOSIS — E1140 Type 2 diabetes mellitus with diabetic neuropathy, unspecified: Secondary | ICD-10-CM

## 2022-11-10 DIAGNOSIS — G8929 Other chronic pain: Secondary | ICD-10-CM

## 2022-11-10 LAB — POCT GLYCOSYLATED HEMOGLOBIN (HGB A1C): HbA1c, POC (controlled diabetic range): 7.5 % — AB (ref 0.0–7.0)

## 2022-11-10 MED ORDER — TRAMADOL HCL 50 MG PO TABS
ORAL_TABLET | ORAL | 0 refills | Status: DC
Start: 2022-11-10 — End: 2023-02-02

## 2022-11-10 NOTE — Assessment & Plan Note (Signed)
Currently taking potassium 10 mEq daily.  Will check BMP today

## 2022-11-10 NOTE — Assessment & Plan Note (Addendum)
A1c 7.5 today, goal 7-7.5 given age of 81. Shared decision making used, patient prefers to stay on current regimen of glipizide and metformin and she will try decreasing sugar in her diet, we also discussed handout provided on diabetes and nutrition. Return in 3 months for A1c and diabetic foot exam

## 2022-11-10 NOTE — Patient Instructions (Signed)
It was great to see you! Thank you for allowing me to participate in your care!  I recommend that you always bring your medications to each appointment as this makes it easy to ensure you are on the correct medications and helps [place] not miss when refills are needed.  Our plans for today:  - See information on diabetic diet. Try to cut back on sweets - return in 3 months for A1c check   We are checking some labs today, I will call you if they are abnormal will send you a MyChart message or a letter if they are normal.  If you do not hear about your labs in the next 2 weeks please let us know.  Take care and seek immediate care sooner if you develop any concerns.   Dr. Erick Alley, DO [facility]

## 2022-11-10 NOTE — Progress Notes (Addendum)
    SUBJECTIVE:   CHIEF COMPLAINT / HPI:   T2DM A1c 08/06/2022 7.6.  Currently taking metformin 1000 mg BID and glipizide 10 mg BID. today patient states she has cut back on soda since it was last checked but has been eating Ic[employer] daily from [employer].  Does check fasting sugar daily - lowest 69 but that is unusual and she felt fine and highest 120 A1c today 7.5  HTN Currently taking atenolol and valsartan, has been checking BP at home running 120's-130's/70-80's  Hypokalemia Has been taking low-dose potassium supplement of 10 mill equivalents daily for years due to history of hypokalemia.   PERTINENT  PMH / PSH: T2DM, HTN, SVT, orthostatic hypotension, chronic back pain  OBJECTIVE:   BP 120/78   Pulse 71   Ht 5\' 5"  (1.651 m)   Wt 161 lb (73 kg)   SpO2 97%   BMI 26.79 kg/m    General: NAD, pleasant, able to participate in exam Cardiac: RRR, no murmurs. Respiratory: Breathing comfortably on room air Neuro: alert, no obvious focal deficits Psych: Normal affect and mood  ASSESSMENT/PLAN:   Essential hypertension Well-controlled on current regimen of atenolol and losartan.  T2DM (type 2 diabetes mellitus) (HCC) A1c 7.5 today, goal 7-7.5 given age of 35. Shared decision making used, patient prefers to stay on current regimen of glipizide and metformin and she will try decreasing sugar in her diet, we also discussed handout provided on diabetes and nutrition. Return in 3 months for A1c  Hypokalemia Currently taking potassium 10 mEq daily.  Will check BMP today   Prevnar 20 given today.  Plans to get second Shingrix vaccine in near future Tramadol refilled for chronic back pain which is appropriate  Dr. Erick Alley, DO [facility]

## 2022-11-10 NOTE — Assessment & Plan Note (Signed)
Well-controlled on current regimen of atenolol and losartan.

## 2022-11-11 ENCOUNTER — Encounter: Payer: Self-pay | Admitting: Student

## 2022-11-11 DIAGNOSIS — J301 Allergic rhinitis due to pollen: Secondary | ICD-10-CM | POA: Diagnosis not present

## 2022-11-11 DIAGNOSIS — J3089 Other allergic rhinitis: Secondary | ICD-10-CM | POA: Diagnosis not present

## 2022-11-11 LAB — BASIC METABOLIC PANEL
BUN/Creatinine Ratio: 8 — ABNORMAL LOW (ref 12–28)
BUN: 8 mg/dL (ref 8–27)
CO2: 22 mmol/L (ref 20–29)
Calcium: 9.3 mg/dL (ref 8.7–10.3)
Chloride: 102 mmol/L (ref 96–106)
Creatinine, Ser: 0.97 mg/dL (ref 0.57–1.00)
Glucose: 155 mg/dL — ABNORMAL HIGH (ref 70–99)
Potassium: 3.9 mmol/L (ref 3.5–5.2)
Sodium: 143 mmol/L (ref 134–144)
eGFR: 63 mL/min/{1.73_m2} (ref >59)

## 2022-11-16 DIAGNOSIS — J3089 Other allergic rhinitis: Secondary | ICD-10-CM | POA: Diagnosis not present

## 2022-11-16 DIAGNOSIS — J3081 Allergic rhinitis due to animal (cat) (dog) hair and dander: Secondary | ICD-10-CM | POA: Diagnosis not present

## 2022-11-16 DIAGNOSIS — J301 Allergic rhinitis due to pollen: Secondary | ICD-10-CM | POA: Diagnosis not present

## 2022-11-23 DIAGNOSIS — J301 Allergic rhinitis due to pollen: Secondary | ICD-10-CM | POA: Diagnosis not present

## 2022-11-23 DIAGNOSIS — J3089 Other allergic rhinitis: Secondary | ICD-10-CM | POA: Diagnosis not present

## 2022-11-23 DIAGNOSIS — J3081 Allergic rhinitis due to animal (cat) (dog) hair and dander: Secondary | ICD-10-CM | POA: Diagnosis not present

## 2022-11-30 ENCOUNTER — Telehealth: Payer: Self-pay

## 2022-11-30 DIAGNOSIS — J301 Allergic rhinitis due to pollen: Secondary | ICD-10-CM | POA: Diagnosis not present

## 2022-11-30 DIAGNOSIS — J3081 Allergic rhinitis due to animal (cat) (dog) hair and dander: Secondary | ICD-10-CM | POA: Diagnosis not present

## 2022-11-30 DIAGNOSIS — J3089 Other allergic rhinitis: Secondary | ICD-10-CM | POA: Diagnosis not present

## 2022-11-30 NOTE — Telephone Encounter (Signed)
Patient calls nurse line requesting prescription for Sucralfate 100 tablets to be sent to [facility] on [address].   She reports that she is unable to afford liquid prescription and would like tablet version sent to pharmacy.   Will forward to Dr. Yetta Barre.   Veronda Prude, RN

## 2022-12-02 DIAGNOSIS — E119 Type 2 diabetes mellitus without complications: Secondary | ICD-10-CM | POA: Diagnosis not present

## 2022-12-02 DIAGNOSIS — H5202 Hypermetropia, left eye: Secondary | ICD-10-CM | POA: Diagnosis not present

## 2022-12-02 DIAGNOSIS — H43813 Vitreous degeneration, bilateral: Secondary | ICD-10-CM | POA: Diagnosis not present

## 2022-12-02 DIAGNOSIS — H26493 Other secondary cataract, bilateral: Secondary | ICD-10-CM | POA: Diagnosis not present

## 2022-12-02 DIAGNOSIS — Z961 Presence of intraocular lens: Secondary | ICD-10-CM | POA: Diagnosis not present

## 2022-12-03 NOTE — Telephone Encounter (Signed)
Patient calls nurse line regarding missing call from PCP.   She is requesting returned call at either [phone] or [phone].   Veronda Prude, RN

## 2022-12-04 ENCOUNTER — Telehealth: Payer: Self-pay | Admitting: Student

## 2022-12-04 DIAGNOSIS — K219 Gastro-esophageal reflux disease without esophagitis: Secondary | ICD-10-CM

## 2022-12-04 MED ORDER — SUCRALFATE 1 G PO TABS
1.0000 g | ORAL_TABLET | Freq: Three times a day (TID) | ORAL | 0 refills | Status: AC
Start: 2022-12-04 — End: ?

## 2022-12-04 NOTE — Telephone Encounter (Signed)
Pt uses sucralfate only when needed, is out of the last rx from 2021. Has severe GERD, has been seen by GI and is on high dose PPI chronically.  Discussed that this medication is not typically used long-term for GERD however patient states she only uses it as needed and it really helps.  Discussed that if symptoms persist, I recommend she get back to her GI doctor for further evaluation to make sure she does not have an ulcer.  Patient agrees.  Rx for sucralfate sent to pharmacy.

## 2022-12-07 DIAGNOSIS — J3081 Allergic rhinitis due to animal (cat) (dog) hair and dander: Secondary | ICD-10-CM | POA: Diagnosis not present

## 2022-12-07 DIAGNOSIS — J301 Allergic rhinitis due to pollen: Secondary | ICD-10-CM | POA: Diagnosis not present

## 2022-12-07 DIAGNOSIS — J3089 Other allergic rhinitis: Secondary | ICD-10-CM | POA: Diagnosis not present

## 2022-12-08 ENCOUNTER — Ambulatory Visit: Payer: Medicare Other

## 2022-12-08 VITALS — BP 142/80 | HR 82 | Wt 159.8 lb

## 2022-12-08 DIAGNOSIS — W19XXXA Unspecified fall, initial encounter: Secondary | ICD-10-CM | POA: Diagnosis not present

## 2022-12-08 DIAGNOSIS — I10 Essential (primary) hypertension: Secondary | ICD-10-CM

## 2022-12-08 NOTE — Patient Instructions (Signed)
It was great to see you today! Thank you for choosing [facility] for your primary care.  Today we addressed: Fall: I am reassured that you are able to walk in here and your fall was due to an instable surface rather than balance difficulty.  Please return should you begin to experience weakness or more frequent falls. HTN: Blood pressure was a little elevated however suspect this was related to your elevated blood pressure.  Please return in a few weeks to have your blood pressure checked by her nurse and make sure to take your blood pressure medications that morning.  If you haven't already, sign up for My Chart to have easy access to your labs results, and communication with your primary care physician.  Return in about 3 weeks (around 12/29/2022) for RN BP check. Please arrive 15 minutes before your appointment to ensure smooth check in process.  We appreciate your efforts in making this happen.  Thank you for allowing me to participate in your care, Shelby Mattocks, DO 12/08/2022, 8:49 AM PGY-3, [facility]

## 2022-12-08 NOTE — Progress Notes (Signed)
  Cardiology Office Note:  .   Date:  12/08/2022  ID:  KEMORIA MACFADYEN, DOB 20-Nov-1950, MRN [id number] PCP: Erick Alley, DO  [facility] Providers EP: Dr. Ladona Ridgel  History of Present Illness: .   [name] is a 72 y.o. female w/PMHx of HTN, SVT, smoker, HLD, DM  She saw Dr. Ladona Ridgel Nov 2023, had lost weight after her husband's death though stabilized at the time of her visit.  No symptoms of her SVT, no changes were made, encouraged to stop smoking   Today's visit is scheduled as an annual visit  ROS:   She is doing very well No palpitations, tachycardias, no CP Reports being very active, walks her dog, with no exertional intolerances No dizzy spells, near syncope or syncope No SOB  Studies Reviewed: Marland Kitchen    EKG done today and reviewed by myself:  SR 84bpm,   Risk Assessment/Calculations:    Physical Exam:   VS:  There were no vitals taken for this visit.   Wt Readings from Last 3 Encounters:  11/10/22 161 lb (73 kg)  09/24/22 160 lb (72.6 kg)  08/28/22 160 lb (72.6 kg)    GEN: Well nourished, well developed in no acute distress NECK: No JVD; No carotid bruits CARDIAC: RRR, no murmurs, rubs, gallops RESPIRATORY:  CTA b/l without rales, wheezing or rhonchi  ABDOMEN: Soft, non-tender, non-distended EXTREMITIES:  No edema; No deformity   ASSESSMENT AND PLAN: .    SVT No symptoms to suggest recurrence, though she is not sure she would know it She checks her BP occasionally perhaps a few times a month, no elevated HRs by her cuff  HTN A recheck is 148/68 She is asked to check her BP daily and keep a log She sees her PMD next week > bring readings and adjust BP meds with her if needed   Dispo: back in a year again with EP, sooner if needed, or PRN.  Signed, Sheilah Pigeon, PA-C

## 2022-12-08 NOTE — Assessment & Plan Note (Addendum)
BP: (!) 142/80 today. Poorly controlled. Goal of <140/90. Follow up in 3 weeks for RN BP check.  Suspect elevated numbers related to not having taken her blood pressure medications this morning.  Medication regimen: Atenolol 25 mg daily, valsartan 20 mg daily

## 2022-12-08 NOTE — Assessment & Plan Note (Addendum)
No obvious sequela of injury, would not recommend imaging at this time.  Further, given the fall was not related to weakness I would not recommend further investigation into the cause of this.  Continue to use Tylenol as needed.  Return should she begin to experience any symptoms of weakness.

## 2022-12-08 NOTE — Progress Notes (Signed)
  SUBJECTIVE:   CHIEF COMPLAINT / HPI:   Fall: Presents after fall on Friday which she stepped out onto a stair that had leaves on it and slipped and fell on her buttocks.  She has been able to walk around and perform activities normally.  She controls her pain with Tylenol and as needed tramadol.  Denies hitting her head nor having loss of consciousness.  She denies any shooting pain down her legs.  PERTINENT  PMH / PSH: Tobacco use, asthma, allergic rhinitis, HTN, T2DM, GERD  OBJECTIVE:  BP (!) 142/80   Pulse 82   Wt 159 lb 12.8 oz (72.5 kg)   SpO2 97%   BMI 26.59 kg/m  General: Well-appearing, NAD CV: RRR, no murmurs auscultated MSK: No bony tenderness of lumbar spine or sacrum, no appreciable bruising on level of lumbar spine, 5/5 hip flexion and knee flexion/extension, normal gait  ASSESSMENT/PLAN:   Assessment & Plan Fall, initial encounter No obvious sequela of injury, would not recommend imaging at this time.  Further, given the fall was not related to weakness I would not recommend further investigation into the cause of this.  Continue to use Tylenol as needed.  Return should she begin to experience any symptoms of weakness. Essential hypertension BP: (!) 142/80 today. Poorly controlled. Goal of <140/90. Follow up in 3 weeks for RN BP check.  Suspect elevated numbers related to not having taken her blood pressure medications this morning.  Medication regimen: Atenolol 25 mg daily, valsartan 20 mg daily Return in about 3 weeks (around 12/29/2022) for RN BP check. Shelby Mattocks, DO 12/08/2022, 8:58 AM PGY-3, [facility]

## 2022-12-09 ENCOUNTER — Other Ambulatory Visit: Payer: Self-pay | Admitting: Student

## 2022-12-09 DIAGNOSIS — I10 Essential (primary) hypertension: Secondary | ICD-10-CM

## 2022-12-14 ENCOUNTER — Other Ambulatory Visit (HOSPITAL_BASED_OUTPATIENT_CLINIC_OR_DEPARTMENT_OTHER): Payer: Self-pay

## 2022-12-14 ENCOUNTER — Ambulatory Visit: Payer: Medicare Other | Attending: Physician Assistant | Admitting: Physician Assistant

## 2022-12-14 ENCOUNTER — Encounter: Payer: Self-pay | Admitting: Physician Assistant

## 2022-12-14 VITALS — BP 160/78 | HR 84 | Ht 65.0 in | Wt 162.0 lb

## 2022-12-14 DIAGNOSIS — J301 Allergic rhinitis due to pollen: Secondary | ICD-10-CM | POA: Diagnosis not present

## 2022-12-14 DIAGNOSIS — I10 Essential (primary) hypertension: Secondary | ICD-10-CM

## 2022-12-14 DIAGNOSIS — J3081 Allergic rhinitis due to animal (cat) (dog) hair and dander: Secondary | ICD-10-CM | POA: Diagnosis not present

## 2022-12-14 DIAGNOSIS — I471 Supraventricular tachycardia, unspecified: Secondary | ICD-10-CM | POA: Diagnosis not present

## 2022-12-14 DIAGNOSIS — J3089 Other allergic rhinitis: Secondary | ICD-10-CM | POA: Diagnosis not present

## 2022-12-14 MED ORDER — EPINEPHRINE 0.3 MG/0.3ML IJ SOAJ
INTRAMUSCULAR | 1 refills | Status: AC
  Filled 2022-12-14: qty 2, 28d supply, fill #0

## 2022-12-14 MED ORDER — EPINEPHRINE 0.3 MG/0.3ML IJ SOAJ
INTRAMUSCULAR | 1 refills | Status: AC
  Filled 2023-03-11: qty 2, 30d supply, fill #0
  Filled 2023-03-15: qty 2, 15d supply, fill #0
  Filled 2023-03-15: qty 2, 30d supply, fill #0

## 2022-12-14 NOTE — Patient Instructions (Signed)
Medication Instructions:   Your physician recommends that you continue on your current medications as directed. Please refer to the Current Medication list given to you today.  *If you need a refill on your cardiac medications before your next appointment, please call your pharmacy*   Lab Work: NONE ORDERED  TODAY   If you have labs (blood work) drawn today and your tests are completely normal, you will receive your results only by: MyChart Message (if you have MyChart) OR A paper copy in the mail If you have any lab test that is abnormal or we need to change your treatment, we will call you to review the results.   Testing/Procedures: NONE ORDERED  TODAY   Follow-Up: At [facility], you and your health needs are our priority.  As part of our continuing mission to provide you with exceptional heart care, we have created designated Provider Care Teams.  These Care Teams include your primary Cardiologist (physician) and Advanced Practice Providers (APPs -  Physician Assistants and Nurse Practitioners) who all work together to provide you with the care you need, when you need it.  We recommend signing up for the patient portal called "MyChart".  Sign up information is provided on this After Visit Summary.  MyChart is used to connect with patients for Virtual Visits (Telemedicine).  Patients are able to view lab/test results, encounter notes, upcoming appointments, etc.  Non-urgent messages can be sent to your provider as well.   To learn more about what you can do with MyChart, go to [email or web address].    Your next appointment:   1 year(s)  Provider:   Francis Dowse, PA-C    Other Instructions

## 2022-12-15 ENCOUNTER — Ambulatory Visit (INDEPENDENT_AMBULATORY_CARE_PROVIDER_SITE_OTHER): Payer: Medicare Other | Admitting: Student

## 2022-12-15 ENCOUNTER — Ambulatory Visit: Payer: Medicare Other | Admitting: Podiatry

## 2022-12-15 ENCOUNTER — Encounter: Payer: Self-pay | Admitting: Podiatry

## 2022-12-15 VITALS — BP 143/81 | HR 78 | Wt 164.6 lb

## 2022-12-15 DIAGNOSIS — M2042 Other hammer toe(s) (acquired), left foot: Secondary | ICD-10-CM

## 2022-12-15 DIAGNOSIS — E119 Type 2 diabetes mellitus without complications: Secondary | ICD-10-CM | POA: Diagnosis not present

## 2022-12-15 DIAGNOSIS — M2041 Other hammer toe(s) (acquired), right foot: Secondary | ICD-10-CM

## 2022-12-15 DIAGNOSIS — M79675 Pain in left toe(s): Secondary | ICD-10-CM | POA: Diagnosis not present

## 2022-12-15 DIAGNOSIS — E1142 Type 2 diabetes mellitus with diabetic polyneuropathy: Secondary | ICD-10-CM

## 2022-12-15 DIAGNOSIS — M79674 Pain in right toe(s): Secondary | ICD-10-CM

## 2022-12-15 DIAGNOSIS — B351 Tinea unguium: Secondary | ICD-10-CM | POA: Diagnosis not present

## 2022-12-15 DIAGNOSIS — I10 Essential (primary) hypertension: Secondary | ICD-10-CM | POA: Diagnosis not present

## 2022-12-15 MED ORDER — CHLORTHALIDONE 25 MG PO TABS: 12.5000 mg | ORAL_TABLET | Freq: Every day | ORAL | 3 refills | Status: DC

## 2022-12-15 NOTE — Progress Notes (Signed)
ANNUAL DIABETIC FOOT EXAM  Subjective: [name] presents today for annual diabetic foot exam. Chief Complaint  Patient presents with   Diabetes    Last A1C, 09/2022, possibly 7.5   Foot Pain    PCP Erick Alley, last seen a couple months ago.   Patient confirms h/o diabetes.  Patient denies any h/o foot wounds.  Patient has been diagnosed with neuropathy.  Erick Alley, DO is patient's PCP.  Past Medical History:  Diagnosis Date   Allergy    Anxiety    AR (allergic rhinitis)    Asthma    on meds   Cataract    CHF (congestive heart failure) (HCC)    hx of   Colon polyps    hyperplastic   Disturbance of skin sensation    facial paresthesia; left   DM2 (diabetes mellitus, type 2) (HCC)    on meds   Dyslipidemia    Fatty liver    GERD (gastroesophageal reflux disease)    on meds   Hemorrhoids    Hyperlipidemia    on meds   Hypertension    essential, benign    Insomnia    Menopausal syndrome    Palpitations    hx   Routine general medical examination at a health care facility    Screening for malignant neoplasm of the cervix    Skin lesion    SVT (supraventricular tachycardia) (HCC)    sx   Tobacco abuse    Weakness    Patient Active Problem List   Diagnosis Date Noted   Fall 12/08/2022   Orthostatic hypotension 01/09/2022   Abnormal ear sensation, right 01/09/2022   Bilateral leg cramps 12/26/2021   Atrophic vaginitis 02/14/2021   Vasomotor rhinitis 11/20/2020   Allergic rhinitis due to animal (cat) (dog) hair and dander 08/13/2020   Allergic rhinitis due to pollen 03/15/2020   Mild intermittent asthma 03/15/2020   Vitamin B12 deficiency 02/16/2020   SVT (supraventricular tachycardia) (HCC) 03/10/2019   Nodule of anterior chest wall 11/12/2018   Shoulder mass 08/22/2016   Sebaceous cyst 06/04/2015   Eczema 05/07/2015   Hypokalemia 05/22/2014   Hepatic steatosis 05/22/2014   Chronic low back pain 05/21/2014   S/P ablation of atrial  flutter 01/09/2014   Dysphagia 02/24/2013   Extrinsic asthma 09/29/2012   Tobacco use disorder 11/03/2007   Essential hypertension 10/18/2007   PTSD (post-traumatic stress disorder) 10/03/2007   T2DM (type 2 diabetes mellitus) (HCC) 08/18/2007   Advanced aortic atherosclerosis (HCC) 02/17/2007   Allergic rhinitis 07/21/2006   GERD 07/21/2006   Past Surgical History:  Procedure Laterality Date   ABDOMINAL HYSTERECTOMY  1990   For fibroids benign.  Still has ovaries   ablation for SVT  09/2007   Dr. Ladona Ridgel   APPENDECTOMY     CATARACT EXTRACTION, BILATERAL     CESAREAN SECTION     COLONOSCOPY  2019   JP-MAC-suprep(Exc)-TA -5 yr recall   dental extractions     HEMORRHOID BANDING  2014   w/Dr.Medoff   SIGMOIDOSCOPY  2004   Current Outpatient Medications on File Prior to Visit  Medication Sig Dispense Refill   Accu-Chek Softclix Lancets lancets USE AS DIRECTED 3 TIMES A DAY 100 each 5   acetaminophen (TYLENOL) 500 MG tablet Take 500 mg by mouth every 6 (six) hours as needed.     aspirin 81 MG chewable tablet Chew 1 tablet (81 mg total) by mouth daily. 100 tablet 6   atenolol (TENORMIN) 25 MG  tablet Take 1 tablet (25 mg total) by mouth daily. 90 tablet 3   atorvastatin (LIPITOR) 40 MG tablet Take 1 tablet by mouth once daily 90 tablet 3   baclofen (LIORESAL) 10 MG tablet Take 10 mg by mouth daily.     cetirizine (ZYRTEC) 10 MG tablet Take 10 mg by mouth daily.     citalopram (CELEXA) 20 MG tablet TAKE 1 TABLET BY MOUTH EVERY DAY 90 tablet 3   EPINEPHrine (EPIPEN 2-PAK) 0.3 mg/0.3 mL IJ SOAJ injection as directed Injection PRN for systemic reaction 30 days 2 each 1   EPINEPHrine (EPIPEN 2-PAK) 0.3 mg/0.3 mL IJ SOAJ injection Inject as needed as directed 2 each 1   EPINEPHrine (EPIPEN 2-PAK) 0.3 mg/0.3 mL IJ SOAJ injection Use as directed as needed 2 each 1   glipiZIDE (GLUCOTROL) 10 MG tablet TAKE 1 TABLET BY MOUTH TWICE A DAY BEFORE A MEAL 180 tablet 2   glucose blood (ONETOUCH  ULTRA) test strip Check fasting blood sugar once daily or more frequently if feeling unwell. 100 strip 12   ipratropium (ATROVENT) 0.03 % nasal spray Place 2 sprays into both nostrils daily at 6 (six) AM.     magnesium oxide (MAG-OX) 400 (240 Mg) MG tablet TAKE ONE TABLET BY MOUTH DAILY 30 tablet 0   metFORMIN (GLUCOPHAGE) 1000 MG tablet TAKE ONE TABLET BY MOUTH TWICE A DAY WITH A MEAL 180 tablet 3   Multiple Vitamins-Minerals (CENTRUM PO) Take 1 tablet by mouth daily.     Olopatadine HCl 0.6 % SOLN Place 1 g into both eyes daily.     pantoprazole (PROTONIX) 40 MG tablet Take 2 tablets (80 mg total) by mouth daily. (Patient taking differently: Take 80 mg by mouth 2 (two) times daily before a meal.) 120 tablet 2   Polyethylene Glycol 400 (BLINK TEARS OP) Place 1 drop into both eyes daily as needed.     potassium chloride (KLOR-CON) 10 MEQ tablet Take 1 tablet (10 mEq total) by mouth daily. 90 tablet 1   simethicone (MYLICON) 125 MG chewable tablet Chew 125 mg by mouth every 6 (six) hours as needed for flatulence.     sucralfate (CARAFATE) 1 g tablet Take 1 tablet (1 g total) by mouth 4 (four) times daily -  with meals and at bedtime. 100 tablet 0   traMADol (ULTRAM) 50 MG tablet TAKE 1 TABLET BY MOUTH 3 TIMES A DAY AS NEEDED FOR LOW BACK PAIN 30 tablet 0   Triamcinolone Acetonide (NASACORT AQ NA) Place 1 Inhaler into the nose daily as needed.     valsartan (DIOVAN) 40 MG tablet Take 0.5 tablets (20 mg total) by mouth daily.     No current facility-administered medications on file prior to visit.    Allergies  Allergen Reactions   Alcohol Swabs [Isopropyl Alcohol] Nausea Only    Use peroxide instead   Penicillins Other (See Comments)    Arm swelling   Prednisone Other (See Comments)    Could not sleep   Amoxicillin-Pot Clavulanate Other (See Comments)    She is unsure what reaction she had   Social History   Occupational History   Occupation: Retired    Comment: Nutritional therapist  Tobacco Use   Smoking status: Every Day    Current packs/day: 0.50    Types: Cigarettes    Passive exposure: Current   Smokeless tobacco: Never  Vaping Use   Vaping status: Never Used  Substance and Sexual Activity  Alcohol use: Not Currently   Drug use: No   Sexual activity: Not Currently   Family History  Problem Relation Age of Onset   Hypertension Mother    Stroke Mother    Diabetes Mother    Cancer Mother    Heart attack Father 61   Diabetes Brother    Diabetes Brother    Coronary artery disease Other        female, 1st degree relative <50   Colon cancer Neg Hx    Esophageal cancer Neg Hx    Colon polyps Neg Hx    Rectal cancer Neg Hx    Stomach cancer Neg Hx    Immunization History  Administered Date(s) Administered   Fluad Quad(high Dose 65+) 09/26/2020, 10/22/2021   Fluad Trivalent(High Dose 65+) 09/28/2022   Influenza Whole 01/12/2005, 10/03/2007, 11/11/2012   Influenza, High Dose Seasonal PF 04/05/2013, 10/17/2013, 10/29/2015, 10/17/2018, 02/13/2020   Influenza,inj,Quad PF,6+ Mos 10/08/2014, 10/18/2017   Influenza-Unspecified 10/13/2015, 10/17/2018   PFIZER Comirnaty(Gray Top)Covid-19 Tri-Sucrose Vaccine 05/22/2020   PFIZER(Purple Top)SARS-COV-2 Vaccination 03/07/2019, 03/28/2019, 10/16/2019, 02/13/2020   PNEUMOCOCCAL CONJUGATE-20 11/10/2022   Pfizer Covid-19 Vaccine Bivalent Booster 20yrs & up 11/18/2020, 02/03/2021   Pfizer(Comirnaty)Fall Seasonal Vaccine 12 years and older 10/22/2021, 09/28/2022   Pneumococcal Conjugate-13 10/18/2017   Pneumococcal Polysaccharide-23 10/12/2001, 09/09/2012, 06/02/2016, 03/16/2018, 11/27/2018, 05/10/2019, 07/03/2019, 02/13/2020   Respiratory Syncytial Virus Vaccine,Recomb Aduvanted(Arexvy) 10/13/2021   Td 03/15/2008   Tdap 04/13/2019   Zoster Recombinant(Shingrix) 01/20/2021     Review of Systems: Negative except as noted in the HPI.   Objective: There were no vitals filed for this  visit.  [name] is a pleasant 72 y.o. female in NAD. AAO X 3.  Title   Diabetic Foot Exam - detailed Date & Time: 12/15/2022  9:45 AM Diabetic Foot exam was performed with the following findings: Yes  Visual Foot Exam completed.: Yes  Is there a history of foot ulcer?: No Is there a foot ulcer now?: No Is there swelling?: No Is there elevated skin temperature?: No Is there abnormal foot shape?: No Is there a claw toe deformity?: No Are the toenails long?: Yes Are the toenails thick?: Yes Are the toenails ingrown?: Yes (Comment: Bilateral great toes.) Is the skin thin, fragile, shiny and hairless?": No Normal Range of Motion?: Yes Is there foot or ankle muscle weakness?: No Do you have pain in calf while walking?: No Are the shoes appropriate in style and fit?: Yes Can the patient see the bottom of their feet?: Yes Pulse Foot Exam completed.: Yes   Right Posterior Tibialis: Present Left posterior Tibialis: Present   Right Dorsalis Pedis: Present Left Dorsalis Pedis: Present     Sensory Foot Exam Completed.: Yes Semmes-Weinstein Monofilament Test "+" means "has sensation" and "-" means "no sensation"  R Foot Test Control: Pos L Foot Test Control: Pos   R Site 1-Great Toe: Pos L Site 1-Great Toe: Pos   R Site 4: Pos L Site 4: Pos   R site 5: Pos L Site 5: Pos  R Site 6: Pos L Site 6: Pos     Image components are not supported.   Image components are not supported. Image components are not supported.  Tuning Fork Right vibratory: present Left vibratory: present  Comments Hammertoes 1-5 b/l     Lab Results  Component Value Date   HGBA1C 7.5 (A) 11/10/2022   ADA Risk Categorization: Low Risk :  Patient has all of the following: Intact protective sensation No  prior foot ulcer  No severe deformity Pedal pulses present  Assessment: 1. Pain due to onychomycosis of toenails of both feet   2. Acquired hammertoes of both feet   3. Diabetic peripheral  neuropathy associated with type 2 diabetes mellitus (HCC)   4. Encounter for diabetic foot exam [facility])     Plan: -Patient was evaluated today. All questions/concerns addressed on today's visit. -Diabetic foot examination performed today. -Continue diabetic foot care principles: inspect feet daily, monitor glucose as recommended by PCP and/or Endocrinologist, and follow prescribed diet per PCP, Endocrinologist and/or dietician. -Patient to continue soft, supportive shoe gear daily. -Mycotic toenails 1-5 bilaterally were debrided in length and girth with sterile nail nippers and dremel without incident. -Patient/POA to call should there be question/concern in the interim. Return in about 3 months (around 03/15/2023).  [name], DPM      [facility] LOCATION: [address] [place] [address]                   Office [phone]   [facility] [address] LOCATION: [address] [place], [place] [address] Office [phone]

## 2022-12-15 NOTE — Patient Instructions (Signed)
It was great seeing you today.  As we discussed, - START taking Chlorthalidone 12.5 mg daily (half of a tablet) - Continue taking Valsartan 20 mg daily (half a tablet, as you have been taking it)  Please keep a daily log of your blood pressures and bring this to your next appointment.   If you have any questions or concerns, please feel free to call the clinic.   Have a wonderful day,  Dr. Darral Dash [facility] [phone]

## 2022-12-15 NOTE — Progress Notes (Signed)
    SUBJECTIVE:   CHIEF COMPLAINT / HPI:   [name] is a pleasant 72 year-old here to discuss elevated blood pressure readings at home.  This morning, after waking up she took her blood pressure and the reading was 187/79 which worried her.  She had yet to take her medications.  She is worried that her readings are creeping back up after discontinuing hydrochlorothiazide around 6 months ago.  She is currently taking Valsartan (Diovan) 20 mg daily, atenolol 25 mg daily (for history of SVT).  She denies any dizziness, weakness, chest pain, shortness of breath.  She did have a fall a few weeks ago, but was related to tripping over a stair.  PERTINENT  PMH / PSH: Tobacco use Asthma Hypertension T2DM GERD  OBJECTIVE:   BP (!) 143/81   Pulse 78   Wt 164 lb 9.6 oz (74.7 kg)   SpO2 100%   BMI 27.39 kg/m   General: Pleasant elderly female in no distress, ambulates independently, in good spirits Cardiac: Regular rate and rhythm with no murmurs appreciated Respiratory: Speaks in full sentences.  Normal work of breathing on room air with no wheezing or crackles on auscultation. Extremities: No edema  ASSESSMENT/PLAN:   Essential hypertension BP elevated x 2 in the 140s-150s/70s to 80s, which is not as concerning as her home blood pressure reading. She is close to her goal, and using shared decision making we decided to add chlorthalidone 12.5 mg daily.  Continue valsartan 20 mg daily and atenolol 25 mg daily. Educated on proper way to take blood pressure reading and that she should have a log to bring into her next visit in 2 weeks for blood pressure check. Discussed importance of slow transitions with rising as to avoid orthostatic hypotension, which she has a clear history of. If she starts to have any dizziness, or hypotension, then she knows to discontinue the chlorthalidone.     Darral Dash, DO [facility]

## 2022-12-16 LAB — HM DIABETES EYE EXAM

## 2022-12-21 ENCOUNTER — Ambulatory Visit: Payer: Medicare Other

## 2022-12-21 VITALS — BP 124/70

## 2022-12-21 DIAGNOSIS — I10 Essential (primary) hypertension: Secondary | ICD-10-CM

## 2022-12-21 NOTE — Assessment & Plan Note (Addendum)
BP elevated x 2 in the 140s-150s/70s to 80s, which is not as concerning as her home blood pressure reading. She is close to her goal, and using shared decision making we decided to add chlorthalidone 12.5 mg daily.  Continue valsartan 20 mg daily and atenolol 25 mg daily. Educated on proper way to take blood pressure reading and that she should have a log to bring into her next visit in 2 weeks for blood pressure check. Discussed importance of slow transitions with rising as to avoid orthostatic hypotension, which she has a clear history of. If she starts to have any dizziness, or hypotension, then she knows to discontinue the chlorthalidone.

## 2022-12-21 NOTE — Progress Notes (Signed)
Patient here today for BP check.      Last BP was on 12/15/2022 and was 143/81.  BP today is 124/70 and patients home blood pressure cuff reading was 124/74 both done simultaneously.   Checked BP in left arm with large cuff.    Symptoms present: None.   Patient last took Valsartan 20mg  at bedtime and takes Chlorthalidone 12.5mg  at 2pm each day. Patient reports she had just taken Chlorthalidone.   Patient has a followup apt scheduled with PCP for 02/02/2023. Patient advised to continue to monitor her BP and current medication regime. Patient to call for a sooner apt if she has higher at home readings.   Routed note to PCP.

## 2022-12-22 DIAGNOSIS — J3081 Allergic rhinitis due to animal (cat) (dog) hair and dander: Secondary | ICD-10-CM | POA: Diagnosis not present

## 2022-12-22 DIAGNOSIS — J301 Allergic rhinitis due to pollen: Secondary | ICD-10-CM | POA: Diagnosis not present

## 2022-12-22 DIAGNOSIS — J3089 Other allergic rhinitis: Secondary | ICD-10-CM | POA: Diagnosis not present

## 2022-12-24 ENCOUNTER — Other Ambulatory Visit (HOSPITAL_BASED_OUTPATIENT_CLINIC_OR_DEPARTMENT_OTHER): Payer: Self-pay

## 2022-12-30 DIAGNOSIS — J301 Allergic rhinitis due to pollen: Secondary | ICD-10-CM | POA: Diagnosis not present

## 2022-12-30 DIAGNOSIS — J3089 Other allergic rhinitis: Secondary | ICD-10-CM | POA: Diagnosis not present

## 2022-12-30 DIAGNOSIS — J3081 Allergic rhinitis due to animal (cat) (dog) hair and dander: Secondary | ICD-10-CM | POA: Diagnosis not present

## 2023-01-01 ENCOUNTER — Other Ambulatory Visit: Payer: Self-pay | Admitting: Student

## 2023-01-01 DIAGNOSIS — E0840 Diabetes mellitus due to underlying condition with diabetic neuropathy, unspecified: Secondary | ICD-10-CM

## 2023-01-01 MED ORDER — METFORMIN HCL 1000 MG PO TABS: 1000.0000 mg | ORAL_TABLET | Freq: Two times a day (BID) | ORAL | 3 refills | Status: AC

## 2023-01-08 DIAGNOSIS — J3089 Other allergic rhinitis: Secondary | ICD-10-CM | POA: Diagnosis not present

## 2023-01-08 DIAGNOSIS — J301 Allergic rhinitis due to pollen: Secondary | ICD-10-CM | POA: Diagnosis not present

## 2023-01-14 DIAGNOSIS — J301 Allergic rhinitis due to pollen: Secondary | ICD-10-CM | POA: Diagnosis not present

## 2023-01-14 DIAGNOSIS — J3089 Other allergic rhinitis: Secondary | ICD-10-CM | POA: Diagnosis not present

## 2023-01-15 DIAGNOSIS — Z1231 Encounter for screening mammogram for malignant neoplasm of breast: Secondary | ICD-10-CM | POA: Diagnosis not present

## 2023-01-20 DIAGNOSIS — J301 Allergic rhinitis due to pollen: Secondary | ICD-10-CM | POA: Diagnosis not present

## 2023-01-20 DIAGNOSIS — J3089 Other allergic rhinitis: Secondary | ICD-10-CM | POA: Diagnosis not present

## 2023-01-22 ENCOUNTER — Other Ambulatory Visit: Payer: Self-pay | Admitting: Student

## 2023-01-25 ENCOUNTER — Other Ambulatory Visit: Payer: Self-pay | Admitting: Student

## 2023-01-29 DIAGNOSIS — J301 Allergic rhinitis due to pollen: Secondary | ICD-10-CM | POA: Diagnosis not present

## 2023-01-29 DIAGNOSIS — J3089 Other allergic rhinitis: Secondary | ICD-10-CM | POA: Diagnosis not present

## 2023-02-01 NOTE — Progress Notes (Unsigned)
    SUBJECTIVE:   CHIEF COMPLAINT / HPI:   HTN 12/15/22 - BP elevated. Chlorthalidone 12.5 mg daily added to regimen of valsartan 20 mg daily and atenolol 25 mg daily.  She has been taking all three.  Home BP readings: 1-teens - 120's/70's  Still smoking 1/2 PPD  T2DM A1c 11/10/23 7.5.  Taking metformin 1000 mg BID and glipizide 10 mg BID Fasting sugars at home: high 70's-90's and feels well   L hip pain  Worse with climbing steps, [employer] for several years Takes tylenol 1000 mg once daily prn which helps Also takes tramadol prn for back pain which helps with the hip pain    PERTINENT  PMH / PSH: tobacco use, HTN, T2DM, tobacco use  OBJECTIVE:   BP 135/73   Pulse 73   Ht 5\' 5"  (1.651 m)   Wt 159 lb 9.6 oz (72.4 kg)   SpO2 100%   BMI 26.56 kg/m    General: NAD, pleasant, able to participate in exam Cardiac: RRR, no murmurs. Respiratory: Breathing comfortably on room air Extremities/MSK: no edema or cyanosis.  Hips: 5/5 muscle strength with adduction, abduction, flexion, extension and good ROM with flexion, extension, internal and external rotation bilaterally. FABER test neg. no TTP of greater trochanter.  Skin: warm and dry, lg ~ 2x2cm hyperpigmented raised lesion on posterior left shoulder which has an opening showing dark material, no surrounding warmth, edema, or erythema Neuro: alert, no obvious focal deficits Psych: Normal affect and mood  ASSESSMENT/PLAN:   Essential hypertension Well-controlled, continue current regimen as in HPI  Pain of left hip Exam benign with great ROM and muscle strength bilaterally.  Low concern for bursitis is no repetitive movement and has been present for years.  Also no pain with palpation at greater trochanter. could be OA given age/chronicity and worse with activity.  -Continue Tylenol and tramadol (refilled) as needed -Provided and reviewed with pt with hip stretches/exercises she can do at home -F/u in about a month, can  consider imaging and PT if patient is willing  T2DM (type 2 diabetes mellitus) (HCC) A1c today 7.9 (goal 7-7.5) Does not want to change medications (won't do any type of injection) Prefers to stay on current regimen and work on diet which was discussed  Recheck in 3 months   Sebaceous cyst At end of visit, patient shows me sebaceous cyst on left shoulder as noted in Dr. Caro Laroche note on 08/05/2021. Does not appear infected. Can discuss further at next visit and consider referral to Derm clinic   Health maintenance: Patient advised to go to pharmacy to get second dose of Shingrix vaccine Due for annual lung cancer screening with low-dose CT - ordered   Dr. Erick Alley, DO [facility]

## 2023-02-02 ENCOUNTER — Ambulatory Visit: Payer: Medicare Other | Admitting: Student

## 2023-02-02 ENCOUNTER — Telehealth: Payer: Self-pay

## 2023-02-02 ENCOUNTER — Encounter: Payer: Self-pay | Admitting: Student

## 2023-02-02 VITALS — BP 135/73 | HR 73 | Ht 65.0 in | Wt 159.6 lb

## 2023-02-02 DIAGNOSIS — G8929 Other chronic pain: Secondary | ICD-10-CM

## 2023-02-02 DIAGNOSIS — J301 Allergic rhinitis due to pollen: Secondary | ICD-10-CM | POA: Diagnosis not present

## 2023-02-02 DIAGNOSIS — I10 Essential (primary) hypertension: Secondary | ICD-10-CM | POA: Diagnosis not present

## 2023-02-02 DIAGNOSIS — M25552 Pain in left hip: Secondary | ICD-10-CM | POA: Insufficient documentation

## 2023-02-02 DIAGNOSIS — F17200 Nicotine dependence, unspecified, uncomplicated: Secondary | ICD-10-CM | POA: Diagnosis not present

## 2023-02-02 DIAGNOSIS — E1140 Type 2 diabetes mellitus with diabetic neuropathy, unspecified: Secondary | ICD-10-CM

## 2023-02-02 DIAGNOSIS — M5440 Lumbago with sciatica, unspecified side: Secondary | ICD-10-CM | POA: Diagnosis not present

## 2023-02-02 DIAGNOSIS — L723 Sebaceous cyst: Secondary | ICD-10-CM

## 2023-02-02 DIAGNOSIS — J3081 Allergic rhinitis due to animal (cat) (dog) hair and dander: Secondary | ICD-10-CM | POA: Diagnosis not present

## 2023-02-02 DIAGNOSIS — J3089 Other allergic rhinitis: Secondary | ICD-10-CM | POA: Diagnosis not present

## 2023-02-02 LAB — POCT GLYCOSYLATED HEMOGLOBIN (HGB A1C): HbA1c, POC (controlled diabetic range): 7.9 % — AB (ref 0.0–7.0)

## 2023-02-02 MED ORDER — TRAMADOL HCL 50 MG PO TABS: ORAL_TABLET | ORAL | 0 refills | Status: DC

## 2023-02-02 NOTE — Assessment & Plan Note (Signed)
At end of visit, patient shows me sebaceous cyst on left shoulder as noted in Dr. Caro Laroche note on 08/05/2021. Does not appear infected. Can discuss further at next visit and consider referral to [facility] clinic

## 2023-02-02 NOTE — Assessment & Plan Note (Signed)
Exam benign with great ROM and muscle strength bilaterally.  Low concern for bursitis is no repetitive movement and has been present for years.  Also no pain with palpation at greater trochanter. could be OA given age/chronicity and worse with activity.  -Continue Tylenol and tramadol (refilled) as needed -Provided and reviewed with pt with hip stretches/exercises she can do at home -F/u in about a month, can consider imaging and PT if patient is willing

## 2023-02-02 NOTE — Telephone Encounter (Signed)
LVM informing patient of CT appointment at [facility]. Penni Bombard CMA

## 2023-02-02 NOTE — Assessment & Plan Note (Addendum)
Well-controlled, continue current regimen as in HPI Encouraged smoking cessation

## 2023-02-02 NOTE — Assessment & Plan Note (Signed)
A1c today 7.9 (goal 7-7.5) Does not want to change medications (won't do any type of injection) Prefers to stay on current regimen and work on diet which was discussed  Recheck in 3 months

## 2023-02-02 NOTE — Patient Instructions (Addendum)
It was great to see you! Thank you for allowing me to participate in your care!  I recommend that you always bring your medications to each appointment as this makes it easy to ensure you are on the correct medications and helps [place] not miss when refills are needed.  Our plans for today:  - continue current medications - continue checking fasting sugars - if lower than 70, please let me know - review hip exercises on handout. Find a few you like and do them daily - You can take tylenol 1000 mg every 6 hours as needed  - Return for follow up in the next couple weeks   Take care and seek immediate care sooner if you develop any concerns.   Dr. Erick Alley, DO [facility]

## 2023-02-03 ENCOUNTER — Telehealth: Payer: Self-pay | Admitting: Student

## 2023-02-03 LAB — BASIC METABOLIC PANEL
BUN/Creatinine Ratio: 14 (ref 12–28)
BUN: 20 mg/dL (ref 8–27)
CO2: 23 mmol/L (ref 20–29)
Calcium: 9.2 mg/dL (ref 8.7–10.3)
Chloride: 100 mmol/L (ref 96–106)
Creatinine, Ser: 1.43 mg/dL — ABNORMAL HIGH (ref 0.57–1.00)
Glucose: 107 mg/dL — ABNORMAL HIGH (ref 70–99)
Potassium: 4.1 mmol/L (ref 3.5–5.2)
Sodium: 140 mmol/L (ref 134–144)
eGFR: 39 mL/min/{1.73_m2} — ABNORMAL LOW (ref >59)

## 2023-02-03 NOTE — Telephone Encounter (Signed)
Attempted to call pt regarding recent results showing significant increase in creatinine.  Would advise she hold chlorthalidone, valsartan, potassium, half metformin dose, avoid NSAIDs, encourage hydration and return for repeat BMP and BP check on Monday.  I LVM without details asking her to call the office back.  I will try calling her again later today.   Addendum 02/04/2023 1055 Discussed the above with patient.  She states she is feeling well today.  Denies taking any NSAIDs.  States she only takes Tylenol and tramadol for pain.  She agrees with plan to hold above medications and half metformin to 500 mg twice daily until kidney function can be reassessed.  Appointment made with access to care on Monday 1/27 for BP check and BMP.  May need to consider alternative BP lowering medications.  If no improvement in creatinine, can continue further workup with renal ultrasound and UACR.

## 2023-02-08 ENCOUNTER — Other Ambulatory Visit (HOSPITAL_BASED_OUTPATIENT_CLINIC_OR_DEPARTMENT_OTHER): Payer: Self-pay

## 2023-02-08 ENCOUNTER — Ambulatory Visit (INDEPENDENT_AMBULATORY_CARE_PROVIDER_SITE_OTHER): Payer: Medicare Other | Admitting: Family Medicine

## 2023-02-08 VITALS — BP 112/73 | HR 71 | Ht 65.0 in | Wt 160.0 lb

## 2023-02-08 DIAGNOSIS — Z23 Encounter for immunization: Secondary | ICD-10-CM

## 2023-02-08 DIAGNOSIS — R7989 Other specified abnormal findings of blood chemistry: Secondary | ICD-10-CM

## 2023-02-08 DIAGNOSIS — I10 Essential (primary) hypertension: Secondary | ICD-10-CM | POA: Diagnosis not present

## 2023-02-08 MED ORDER — SHINGRIX 50 MCG/0.5ML IM SUSR: INTRAMUSCULAR | 1 refills | Status: DC

## 2023-02-08 NOTE — Patient Instructions (Signed)
I will give you a call with your results within the next few days  In the meantime please continue taking your medications as is

## 2023-02-08 NOTE — Assessment & Plan Note (Signed)
As above.  Holding chlorthalidone and valsartan

## 2023-02-08 NOTE — Addendum Note (Signed)
Addended byBarb Merino, Renu Asby on: 02/08/2023 09:45 AM   Modules accepted: Orders

## 2023-02-08 NOTE — Progress Notes (Signed)
    SUBJECTIVE:   CHIEF COMPLAINT / HPI:   Saw Dr. Yetta Barre 1/21, had elevated Cr on BMP Since then has been taking 500mg  metformin Holding potassium, valsartan, chlorthalidone Avoiding NSAIDs  Home BP has run 120s-130/70s Denies headaches, fevers, chills, dysuria, hematuria, dizziness, lightheadedness   PERTINENT  PMH / PSH: HTN, DM  OBJECTIVE:   BP 112/73   Pulse 71   Ht 5\' 5"  (1.651 m)   Wt 160 lb (72.6 kg)   SpO2 100%   BMI 26.63 kg/m   General: NAD, pleasant, able to participate in exam Respiratory: No respiratory distress Skin: warm and dry, no rashes noted Psych: Normal affect and mood  ASSESSMENT/PLAN:   Assessment & Plan Elevated serum creatinine Noted at her visit 1/21 with Dr. Yetta Barre.  Possibly medication induced versus dehydration.  Recheck BMP today.  Given that her blood pressure is normal today and has been normal at home since stopping her chlorthalidone and valsartan, anticipate that if her creatinine improves we will likely continue to hold valsartan and chlorthalidone (especially given normal UACR recently) but advise going back to her regular metformin dose.  Follow-up potassium level as well but anticipate she will no longer need this Essential hypertension As above.  Holding chlorthalidone and valsartan   Vonna Drafts, MD [facility]

## 2023-02-09 ENCOUNTER — Ambulatory Visit (HOSPITAL_COMMUNITY)
Admission: RE | Admit: 2023-02-09 | Discharge: 2023-02-09 | Disposition: A | Discharge status: Home / Self Care | Payer: Medicare Other | Source: Ambulatory Visit | Attending: Family Medicine | Admitting: Family Medicine

## 2023-02-09 DIAGNOSIS — Z122 Encounter for screening for malignant neoplasm of respiratory organs: Secondary | ICD-10-CM | POA: Diagnosis not present

## 2023-02-09 DIAGNOSIS — F17210 Nicotine dependence, cigarettes, uncomplicated: Secondary | ICD-10-CM | POA: Insufficient documentation

## 2023-02-09 DIAGNOSIS — F17200 Nicotine dependence, unspecified, uncomplicated: Secondary | ICD-10-CM | POA: Insufficient documentation

## 2023-02-09 LAB — BASIC METABOLIC PANEL
BUN/Creatinine Ratio: 12 (ref 12–28)
BUN: 14 mg/dL (ref 8–27)
CO2: 24 mmol/L (ref 20–29)
Calcium: 9.4 mg/dL (ref 8.7–10.3)
Chloride: 101 mmol/L (ref 96–106)
Creatinine, Ser: 1.18 mg/dL — ABNORMAL HIGH (ref 0.57–1.00)
Glucose: 138 mg/dL — ABNORMAL HIGH (ref 70–99)
Potassium: 3.7 mmol/L (ref 3.5–5.2)
Sodium: 142 mmol/L (ref 134–144)
eGFR: 50 mL/min/{1.73_m2} — ABNORMAL LOW (ref >59)

## 2023-02-10 ENCOUNTER — Telehealth: Payer: Self-pay | Admitting: Family Medicine

## 2023-02-10 NOTE — Telephone Encounter (Signed)
Called to discuss with patient.  Creatinine improving but not entirely normalized yet.  Advised that I am comfortable with her restarting her metformin at its normal dose of 1000 mg twice daily.  She reports normal home BP readings, so I am comfortable with her continuing to hold her chlorthalidone and valsartan until she sees Dr. Yetta Barre on 2/21.  Potassium is 3.7 which is on the low end of normal so I advised that I am also comfortable with either holding off on her potassium or restarting it especially since she is holding the valsartan now.  Either way, recommend recheck BMP on 2/21  Vonna Drafts, MD

## 2023-02-11 DIAGNOSIS — J301 Allergic rhinitis due to pollen: Secondary | ICD-10-CM | POA: Diagnosis not present

## 2023-02-11 DIAGNOSIS — J3081 Allergic rhinitis due to animal (cat) (dog) hair and dander: Secondary | ICD-10-CM | POA: Diagnosis not present

## 2023-02-11 DIAGNOSIS — J3089 Other allergic rhinitis: Secondary | ICD-10-CM | POA: Diagnosis not present

## 2023-02-19 ENCOUNTER — Telehealth: Payer: Self-pay

## 2023-02-19 DIAGNOSIS — J3089 Other allergic rhinitis: Secondary | ICD-10-CM | POA: Diagnosis not present

## 2023-02-19 DIAGNOSIS — J301 Allergic rhinitis due to pollen: Secondary | ICD-10-CM | POA: Diagnosis not present

## 2023-02-19 NOTE — Telephone Encounter (Signed)
 Called pt to discuss CT results showing 5.2 mm nodule in RLL. Discussed low risk for malignancy given nodule is <6 mm but will plan  to repeat low dose chest CT in 6 months to monitor.

## 2023-02-19 NOTE — Telephone Encounter (Signed)
 Received call from [facility] regarding results from recent CT Lung Cancer Screening.   See below.   IMPRESSION: 1. Lung-RADS 3, probably benign findings. Short-term follow-up in 6 months is recommended with repeat low-dose chest CT without contrast (please use the following order, CT CHEST LCS NODULE FOLLOW-UP W/O CM). New solid 5.2 mm posterior basilar right lower lobe pulmonary nodule. 2. Two-vessel coronary atherosclerosis. 3. Aortic Atherosclerosis (ICD10-I70.0) and Emphysema (ICD10-J43.9).  Forwarding to PCP.   Chiquita JAYSON English, RN

## 2023-02-22 ENCOUNTER — Other Ambulatory Visit: Payer: Self-pay | Admitting: Internal Medicine

## 2023-02-22 DIAGNOSIS — I10 Essential (primary) hypertension: Secondary | ICD-10-CM

## 2023-02-25 DIAGNOSIS — J3089 Other allergic rhinitis: Secondary | ICD-10-CM | POA: Diagnosis not present

## 2023-02-25 DIAGNOSIS — J3081 Allergic rhinitis due to animal (cat) (dog) hair and dander: Secondary | ICD-10-CM | POA: Diagnosis not present

## 2023-02-25 DIAGNOSIS — J301 Allergic rhinitis due to pollen: Secondary | ICD-10-CM | POA: Diagnosis not present

## 2023-03-05 ENCOUNTER — Ambulatory Visit (INDEPENDENT_AMBULATORY_CARE_PROVIDER_SITE_OTHER): Payer: Medicare Other | Admitting: Student

## 2023-03-05 ENCOUNTER — Encounter: Payer: Self-pay | Admitting: Student

## 2023-03-05 VITALS — BP 139/73 | HR 78 | Ht 65.0 in | Wt 158.8 lb

## 2023-03-05 DIAGNOSIS — R7989 Other specified abnormal findings of blood chemistry: Secondary | ICD-10-CM

## 2023-03-05 DIAGNOSIS — N189 Chronic kidney disease, unspecified: Secondary | ICD-10-CM

## 2023-03-05 DIAGNOSIS — G8929 Other chronic pain: Secondary | ICD-10-CM | POA: Diagnosis not present

## 2023-03-05 DIAGNOSIS — M5440 Lumbago with sciatica, unspecified side: Secondary | ICD-10-CM | POA: Diagnosis not present

## 2023-03-05 DIAGNOSIS — I10 Essential (primary) hypertension: Secondary | ICD-10-CM

## 2023-03-05 HISTORY — DX: Chronic kidney disease, unspecified: N18.9

## 2023-03-05 MED ORDER — TRAMADOL HCL 50 MG PO TABS
ORAL_TABLET | ORAL | 0 refills | Status: DC
Start: 2023-03-05 — End: 2023-04-23

## 2023-03-05 MED ORDER — VALSARTAN 40 MG PO TABS
20.0000 mg | ORAL_TABLET | Freq: Every day | ORAL | Status: DC
Start: 2023-03-05 — End: 2023-03-09

## 2023-03-05 NOTE — Assessment & Plan Note (Addendum)
 Controlled on atenolol 25 mg daily, chlorthalidone 12.5 mg daily, valsartan 20 mg daily (prescribed 40 mg daily).  -Can continue on current regimen (valsartan now changed in epic to 20 mg) -If cr is stable/back to baseline, may consider increasing valsartan to 40 mg and d/cing chlorthalidone

## 2023-03-05 NOTE — Assessment & Plan Note (Addendum)
 Improved from 1.43 to 1.18 after holding metformin, chlorthalidone, valsartan.  She has restarted all 3 of these medications but valsartan at half dosing.  Still not back to baseline as of 1/27. -BMP today to recheck creatinine -If elevated, will consider holding chlorthalidone and valsartan and may need to consider alternative BP meds

## 2023-03-05 NOTE — Patient Instructions (Signed)
 It was great to see you! Thank you for allowing me to participate in your care!   Our plans for today:  - Continue current medications for now until we get your labs back to look at kidney function.  We are checking some labs today, I will call you if they are abnormal will send you a MyChart message or a letter if they are normal.  If you do not hear about your labs in the next 2 weeks please let us know.  Take care and seek immediate care sooner if you develop any concerns.   Dr. Erick Alley, DO [facility]

## 2023-03-05 NOTE — Progress Notes (Signed)
    SUBJECTIVE:   CHIEF COMPLAINT / HPI:   Elevated creatinine  HTN Patient with recent elevation in creatinine and previously advised to hold metformin, chlorthalidone and valsartan.  Had repeat creatinine 1/27 with improvement from 1.43 to 1.18 and was advised by Dr. Barb Merino to restart metformin and continue to hold BP meds. Today patient reports she started back on all 3 medications except only taking  1/2 tab (20 mg) of valsartan.  BP has been ~120-130's/70's Feeling well today.   PERTINENT  PMH / PSH:   OBJECTIVE:   BP 139/73   Pulse 78   Ht 5\' 5"  (1.651 m)   Wt 158 lb 12.8 oz (72 kg)   SpO2 99%   BMI 26.43 kg/m    General: NAD, pleasant, able to participate in exam Cardiac: RRR, no murmurs Respiratory: normal effort Neuro: alert, no obvious focal deficits Psych: Normal affect and mood  ASSESSMENT/PLAN:   Elevated serum creatinine Improved from 1.43 to 1.18 after holding metformin, chlorthalidone, valsartan.  She has restarted all 3 of these medications but valsartan at half dosing.  Still not back to baseline as of 1/27. -BMP today to recheck creatinine -If elevated, will consider holding chlorthalidone and valsartan and may need to consider alternative BP meds  Essential hypertension Controlled on atenolol 25 mg daily, chlorthalidone 12.5 mg daily, valsartan 20 mg daily (prescribed 40 mg daily).  -Can continue on current regimen (valsartan now changed in epic to 20 mg) -If cr is stable/back to baseline, may consider increasing valsartan to 40 mg and d/cing chlorthalidone      Dr. Erick Alley, DO [place] [facility]

## 2023-03-06 LAB — BASIC METABOLIC PANEL
BUN/Creatinine Ratio: 14 (ref 12–28)
BUN: 15 mg/dL (ref 8–27)
CO2: 22 mmol/L (ref 20–29)
Calcium: 9.2 mg/dL (ref 8.7–10.3)
Chloride: 98 mmol/L (ref 96–106)
Creatinine, Ser: 1.11 mg/dL — ABNORMAL HIGH (ref 0.57–1.00)
Glucose: 293 mg/dL — ABNORMAL HIGH (ref 70–99)
Potassium: 3.8 mmol/L (ref 3.5–5.2)
Sodium: 137 mmol/L (ref 134–144)
eGFR: 53 mL/min/{1.73_m2} — ABNORMAL LOW (ref >59)

## 2023-03-08 ENCOUNTER — Telehealth: Payer: Self-pay | Admitting: Student

## 2023-03-08 NOTE — Telephone Encounter (Signed)
 Called pt to discuss labs showing Cr is stable at 1.1 but still slightly above baseline.  Advised to stop chlorthalidone and take full dose of valsartan (40 mg) to reduce number of medications.  Micro albumin creatinine ratio normal July 2024.  Can consider rechecking at next visit and consider renal ultrasound.

## 2023-03-09 ENCOUNTER — Other Ambulatory Visit: Payer: Self-pay | Admitting: Student

## 2023-03-09 DIAGNOSIS — I10 Essential (primary) hypertension: Secondary | ICD-10-CM

## 2023-03-09 MED ORDER — VALSARTAN 40 MG PO TABS: 40.0000 mg | ORAL_TABLET | Freq: Every day | ORAL | 3 refills | Status: AC

## 2023-03-09 NOTE — Telephone Encounter (Signed)
 Patient returns call to nurse line. She states that she does not have any losartan and that she would need refill sent to [facility].   Forwarding request to PCP.   Veronda Prude, RN

## 2023-03-11 ENCOUNTER — Other Ambulatory Visit (HOSPITAL_BASED_OUTPATIENT_CLINIC_OR_DEPARTMENT_OTHER): Payer: Self-pay

## 2023-03-12 ENCOUNTER — Telehealth: Payer: Self-pay | Admitting: Student

## 2023-03-12 NOTE — Telephone Encounter (Signed)
 Attempted to call patient to clarify that valsartan is the medicine she is prescribed and should be taking and likely has at home, not losartan.  Left voicemail that I will try her later.

## 2023-03-12 NOTE — Telephone Encounter (Signed)
 Patient returns call to nurse line regarding medication.   Unsure if patient is supposed to be taking valsartan vs losartan.   Will forward to PCP for clarification.   Veronda Prude, RN

## 2023-03-14 ENCOUNTER — Other Ambulatory Visit: Payer: Self-pay | Admitting: Student

## 2023-03-14 DIAGNOSIS — K219 Gastro-esophageal reflux disease without esophagitis: Secondary | ICD-10-CM

## 2023-03-15 ENCOUNTER — Other Ambulatory Visit (HOSPITAL_BASED_OUTPATIENT_CLINIC_OR_DEPARTMENT_OTHER): Payer: Self-pay

## 2023-03-16 DIAGNOSIS — J3089 Other allergic rhinitis: Secondary | ICD-10-CM | POA: Diagnosis not present

## 2023-03-16 DIAGNOSIS — J301 Allergic rhinitis due to pollen: Secondary | ICD-10-CM | POA: Diagnosis not present

## 2023-03-18 ENCOUNTER — Other Ambulatory Visit: Payer: Self-pay | Admitting: Student

## 2023-03-18 DIAGNOSIS — I471 Supraventricular tachycardia, unspecified: Secondary | ICD-10-CM

## 2023-03-19 ENCOUNTER — Ambulatory Visit (INDEPENDENT_AMBULATORY_CARE_PROVIDER_SITE_OTHER): Payer: Medicare Other | Admitting: Student

## 2023-03-19 ENCOUNTER — Encounter: Payer: Self-pay | Admitting: Student

## 2023-03-19 VITALS — BP 137/79 | HR 83 | Ht 65.0 in | Wt 159.6 lb

## 2023-03-19 DIAGNOSIS — R7989 Other specified abnormal findings of blood chemistry: Secondary | ICD-10-CM

## 2023-03-19 DIAGNOSIS — I10 Essential (primary) hypertension: Secondary | ICD-10-CM

## 2023-03-19 DIAGNOSIS — J301 Allergic rhinitis due to pollen: Secondary | ICD-10-CM | POA: Diagnosis not present

## 2023-03-19 DIAGNOSIS — J3089 Other allergic rhinitis: Secondary | ICD-10-CM | POA: Diagnosis not present

## 2023-03-19 NOTE — Progress Notes (Signed)
    SUBJECTIVE:   CHIEF COMPLAINT / HPI:   HTN and elevated creatinine Recently D/C'd chlorthalidone and increased valsartan back to 40 mg daily after patient had significant elevation in creatinine after starting chlorthalidone. She also takes atenolol 25 mg daily primarily for SVT. Patient presents today to recheck the kidney function and BP.  She is feeling well today.  PERTINENT  PMH / PSH: SVT, HTN, orthostatic hypotension  OBJECTIVE:   BP 137/79   Pulse 83   Ht 5\' 5"  (1.651 m)   Wt 159 lb 9.6 oz (72.4 kg)   SpO2 100%   BMI 26.56 kg/m    General: NAD, pleasant, able to participate in exam Cardiac: Well-perfused Respiratory: Breathing comfortably on room air Skin: warm and dry Neuro: alert, no obvious focal deficits Psych: Normal affect and mood  ASSESSMENT/PLAN:   Essential hypertension Well-controlled today.  -Continue atenolol 25 mg daily, valsartan 40 mg daily  Elevated serum creatinine Improved from 1.43 to 1.18 two weeks ago.  Will check today to ensure it has remained stable after increasing valsartan and check UmACR today given concern for possible new CKD diagnosis. Can consider renal US based on labs    Return next month for A1c  Dr. Erick Alley, DO [place] [facility]

## 2023-03-19 NOTE — Patient Instructions (Addendum)
 It was great to see you! Thank you for allowing me to participate in your care!   Our plans for today:  - Continue current medications -Return next month for A1c  Take care and seek immediate care sooner if you develop any concerns.   Dr. Erick Alley, DO [facility]

## 2023-03-20 LAB — BASIC METABOLIC PANEL
BUN/Creatinine Ratio: 12 (ref 12–28)
BUN: 13 mg/dL (ref 8–27)
CO2: 21 mmol/L (ref 20–29)
Calcium: 9.3 mg/dL (ref 8.7–10.3)
Chloride: 101 mmol/L (ref 96–106)
Creatinine, Ser: 1.13 mg/dL — ABNORMAL HIGH (ref 0.57–1.00)
Glucose: 369 mg/dL — ABNORMAL HIGH (ref 70–99)
Potassium: 3.9 mmol/L (ref 3.5–5.2)
Sodium: 138 mmol/L (ref 134–144)
eGFR: 52 mL/min/{1.73_m2} — ABNORMAL LOW (ref >59)

## 2023-03-20 NOTE — Assessment & Plan Note (Signed)
 Improved from 1.43 to 1.18 two weeks ago.  Will check today to ensure it has remained stable after increasing valsartan and check UmACR today given concern for possible new CKD diagnosis. Can consider renal US based on labs

## 2023-03-20 NOTE — Assessment & Plan Note (Signed)
 Well-controlled today.  -Continue atenolol 25 mg daily, valsartan 40 mg daily

## 2023-03-21 LAB — MICROALBUMIN / CREATININE URINE RATIO
Creatinine, Urine: 158 mg/dL
Microalb/Creat Ratio: 5 mg/g{creat} (ref 0–29)
Microalbumin, Urine: 7.8 ug/mL

## 2023-03-22 ENCOUNTER — Telehealth: Payer: Self-pay

## 2023-03-22 ENCOUNTER — Telehealth: Payer: Self-pay | Admitting: Student

## 2023-03-22 NOTE — Telephone Encounter (Signed)
 Patient calls nurse line returning a call to PCP.  I see where a telephone note is open from Dr. Yetta Barre, however no documentation.   Will forward to PCP.

## 2023-03-24 DIAGNOSIS — J301 Allergic rhinitis due to pollen: Secondary | ICD-10-CM | POA: Diagnosis not present

## 2023-03-24 DIAGNOSIS — J3089 Other allergic rhinitis: Secondary | ICD-10-CM | POA: Diagnosis not present

## 2023-03-24 NOTE — Progress Notes (Signed)
 Called patient to discuss results showing stable creatinine although not back to baseline and GFR 52.  Shared decision making used, plan for recheck in April which will be 58-month mark.  If still elevated will officially diagnosed with CKD and get ultrasound.  Reassuringly ,UmACR is normal.

## 2023-03-25 NOTE — Telephone Encounter (Signed)
 Error

## 2023-03-28 ENCOUNTER — Other Ambulatory Visit: Payer: Self-pay | Admitting: Student

## 2023-03-28 DIAGNOSIS — E0840 Diabetes mellitus due to underlying condition with diabetic neuropathy, unspecified: Secondary | ICD-10-CM

## 2023-03-29 ENCOUNTER — Other Ambulatory Visit: Payer: Self-pay | Admitting: Student

## 2023-03-29 DIAGNOSIS — E0840 Diabetes mellitus due to underlying condition with diabetic neuropathy, unspecified: Secondary | ICD-10-CM

## 2023-04-01 ENCOUNTER — Ambulatory Visit

## 2023-04-01 DIAGNOSIS — E1142 Type 2 diabetes mellitus with diabetic polyneuropathy: Secondary | ICD-10-CM

## 2023-04-01 DIAGNOSIS — J3089 Other allergic rhinitis: Secondary | ICD-10-CM | POA: Diagnosis not present

## 2023-04-01 DIAGNOSIS — M2041 Other hammer toe(s) (acquired), right foot: Secondary | ICD-10-CM

## 2023-04-01 DIAGNOSIS — M2141 Flat foot [pes planus] (acquired), right foot: Secondary | ICD-10-CM

## 2023-04-01 DIAGNOSIS — J301 Allergic rhinitis due to pollen: Secondary | ICD-10-CM | POA: Diagnosis not present

## 2023-04-01 NOTE — Progress Notes (Signed)
 Patient presents to the office today for diabetic shoe and insole measuring.  Patient was measured with brannock device to determine size and width for 1 pair of extra depth shoes and foam casted for 3 pair of insoles.   Documentation of medical necessity will be sent to patient's treating diabetic doctor to verify and sign.   Patient's diabetic provider: Erick Alley Do   Shoes and insoles will be ordered at that time and patient will be notified for an appointment for fitting when they arrive.   Shoe size (per patient): 11 Patient shoe selection- Shoe choice:   10WD Shoe size ordered: [id number]   PPW / ABN signed

## 2023-04-09 DIAGNOSIS — J3081 Allergic rhinitis due to animal (cat) (dog) hair and dander: Secondary | ICD-10-CM | POA: Diagnosis not present

## 2023-04-09 DIAGNOSIS — K219 Gastro-esophageal reflux disease without esophagitis: Secondary | ICD-10-CM | POA: Diagnosis not present

## 2023-04-09 DIAGNOSIS — J3089 Other allergic rhinitis: Secondary | ICD-10-CM | POA: Diagnosis not present

## 2023-04-09 DIAGNOSIS — J301 Allergic rhinitis due to pollen: Secondary | ICD-10-CM | POA: Diagnosis not present

## 2023-04-09 DIAGNOSIS — J4520 Mild intermittent asthma, uncomplicated: Secondary | ICD-10-CM | POA: Diagnosis not present

## 2023-04-09 DIAGNOSIS — H1045 Other chronic allergic conjunctivitis: Secondary | ICD-10-CM | POA: Diagnosis not present

## 2023-04-12 ENCOUNTER — Telehealth: Payer: Self-pay

## 2023-04-12 NOTE — Telephone Encounter (Signed)
 Patient LVM on nurse line due to concerns with elevated BP.   Per last note, pt is taking atenolol 25 mg and valsartan 40 mg daily.   Attempted to return call to patient to gather more information, she did not answer. LVM asking that she return call to office to discuss BP concern further.   Veronda Prude, RN

## 2023-04-13 ENCOUNTER — Encounter: Payer: Self-pay | Admitting: Family Medicine

## 2023-04-13 ENCOUNTER — Ambulatory Visit (INDEPENDENT_AMBULATORY_CARE_PROVIDER_SITE_OTHER): Admitting: Family Medicine

## 2023-04-13 VITALS — BP 142/79 | HR 76 | Ht 65.0 in | Wt 156.6 lb

## 2023-04-13 DIAGNOSIS — I10 Essential (primary) hypertension: Secondary | ICD-10-CM | POA: Diagnosis not present

## 2023-04-13 NOTE — Progress Notes (Signed)
    SUBJECTIVE:   CHIEF COMPLAINT / HPI:   HTN: Here for f/u due to high BP measured at home on Atenolol 25 mg QD. Per the patient, her PCP d/ced her Chlorthalidone and Valsartan. She resumed her Chlorthalidone 12.5 mg yesterday and does not think she has Valsartan at home as she can't remember being on this medication recently. She denies any cardio-pulm or neurologic symptoms. Feels well otherwise.    PERTINENT  PMH / PSH: PMHx reviewed  OBJECTIVE:   BP (!) 142/79   Pulse 76   Ht 5\' 5"  (1.651 m)   Wt 156 lb 9.6 oz (71 kg)   SpO2 100%   BMI 26.06 kg/m   Physical Exam Vitals reviewed.  Constitutional:      Appearance: Normal appearance.  Cardiovascular:     Rate and Rhythm: Normal rate and regular rhythm.     Heart sounds: Normal heart sounds. No murmur heard. Pulmonary:     Effort: Pulmonary effort is normal. No respiratory distress.     Breath sounds: Normal breath sounds. No wheezing or rhonchi.  Neurological:     Mental Status: She is alert.      ASSESSMENT/PLAN:   Assessment & Plan Essential hypertension Advised to continue Atenolol 25 mg QD D/C Chlorthalidone per PCP Continue Valsartan - currently off this meds and uncertain if she has it at home I will call her after 3 pm per her request to confirm if she has this med at home or not Can take all BP meds at night F/U appointment made with her PCP in 1 week for BP management and renal function test All questions were answered   Telephone call:  She confirmed she is out of her Valsartan. Hence reason for her elevated BP She does have Valsartan refills at her pharmacy and I informed her of this D/C Chlorthalidone per PCP Continue Valsartan and Atenolol F/U with PCP as planned She agreed with the plan and read back the instruction provided    Janit Pagan, MD [facility]

## 2023-04-13 NOTE — Patient Instructions (Signed)
 It was nice seeing you today.  Your BP looks good. According to your PCP, you should be on Atenolol 25 mg daily and Valsartan 40 mg daily. She recommended you stop the Chlorthalidone. I will call you later today to review your home medication and if you don't have Valsartan at home, I will call it in to the pharmacy. Please see PCP in 1 week for lab work.

## 2023-04-13 NOTE — Assessment & Plan Note (Addendum)
 Advised to continue Atenolol 25 mg QD D/C Chlorthalidone per PCP Continue Valsartan - currently off this meds and uncertain if she has it at home I will call her after 3 pm per her request to confirm if she has this med at home or not Can take all BP meds at night F/U appointment made with her PCP in 1 week for BP management and renal function test All questions were answered   Telephone call:  She confirmed she is out of her Valsartan. Hence reason for her elevated BP She does have Valsartan refills at her pharmacy and I informed her of this D/C Chlorthalidone per PCP Continue Valsartan and Atenolol F/U with PCP as planned She agreed with the plan and read back the instruction provided

## 2023-04-14 ENCOUNTER — Ambulatory Visit: Payer: Medicare Other | Admitting: Podiatry

## 2023-04-14 ENCOUNTER — Encounter: Payer: Self-pay | Admitting: Podiatry

## 2023-04-14 VITALS — Ht 65.0 in | Wt 156.0 lb

## 2023-04-14 DIAGNOSIS — E1142 Type 2 diabetes mellitus with diabetic polyneuropathy: Secondary | ICD-10-CM

## 2023-04-14 DIAGNOSIS — B351 Tinea unguium: Secondary | ICD-10-CM | POA: Diagnosis not present

## 2023-04-14 DIAGNOSIS — M79675 Pain in left toe(s): Secondary | ICD-10-CM

## 2023-04-14 DIAGNOSIS — J3081 Allergic rhinitis due to animal (cat) (dog) hair and dander: Secondary | ICD-10-CM | POA: Diagnosis not present

## 2023-04-14 DIAGNOSIS — M79674 Pain in right toe(s): Secondary | ICD-10-CM

## 2023-04-14 DIAGNOSIS — J3089 Other allergic rhinitis: Secondary | ICD-10-CM | POA: Diagnosis not present

## 2023-04-14 DIAGNOSIS — J301 Allergic rhinitis due to pollen: Secondary | ICD-10-CM | POA: Diagnosis not present

## 2023-04-19 NOTE — Progress Notes (Signed)
  Subjective:  Patient ID: [name], female    DOB: 1950/02/15,  MRN: [id number]  [name] presents to clinic today for: at risk foot care with history of diabetic neuropathy and painful, elongated thickened toenails x 10 which are symptomatic when wearing enclosed shoe gear. This interferes with his/her daily activities.  Chief Complaint  Patient presents with   [name]    She is here for diabetic nail trim. PCP is Dr Yetta Barre, Last A1C was 7.9.     PCP is Erick Alley, DO. Theron Arista 03/19/2023.  Allergies  Allergen Reactions   Alcohol Swabs [Isopropyl Alcohol] Nausea Only    Use peroxide instead   Penicillins Other (See Comments)    Arm swelling   Prednisone Other (See Comments)    Could not sleep   Amoxicillin-Pot Clavulanate Other (See Comments)    She is unsure what reaction she had    Review of Systems: Negative except as noted in the HPI.  Objective: No changes noted in today's physical examination. There were no vitals filed for this visit.  [name] is a pleasant 73 y.o. female WD, WN in NAD. AAO x 3.  Vascular Examination: Capillary refill time <3 seconds b/l LE. Palpable pedal pulses b/l LE. Digital hair present b/l. No pedal edema b/l. Skin temperature gradient WNL b/l. No varicosities b/l. No edema noted b/l LE.[name]  Dermatological Examination: Pedal skin with normal turgor, texture and tone b/l. No open wounds. No interdigital macerations b/l. Toenails 1-5 b/l thickened, discolored, dystrophic with subungual debris. There is pain on palpation to dorsal aspect of nailplates. No hyperkeratotic nor porokeratotic lesions present on today's visit.[name]  Neurological Examination: Protective sensation intact with 10 gram monofilament b/l LE. Vibratory sensation intact b/l LE. Pt has subjective symptoms of neuropathy.  Musculoskeletal Examination: Muscle strength 5/5 to all lower extremity muscle groups bilaterally. Hammertoe deformity noted 1-5 b/l. Patient  ambulates independent of any assistive aids.      Latest Ref Rng & Units 02/02/2023    8:30 AM 11/10/2022    9:00 AM 08/06/2022    8:25 AM 05/12/2022    1:40 PM  Hemoglobin A1C  Hemoglobin-A1c 0.0 - 7.0 % 7.9  7.5  7.6  7.9    Assessment/Plan: 1. Pain due to onychomycosis of toenails of both feet   2. Diabetic peripheral neuropathy associated with type 2 diabetes mellitus [facility])     Consent given for treatment. Patient examined. All patient's and/or POA's questions/concerns addressed on today's visit. Toenails 1-5 debrided in length and girth without incident. Continue foot and shoe inspections daily. Monitor blood glucose per PCP/Endocrinologist's recommendations. Continue soft, supportive shoe gear daily. Report any pedal injuries to medical professional. Call office if there are any questions/concerns. -Patient/POA to call should there be question/concern in the interim.   Return in about 3 months (around 07/14/2023).  [name], DPM      [facility] LOCATION: [address], [place] [address]                   Office [phone]   [facility] LOCATION: [address] [place], [place] [address] Office [phone]

## 2023-04-21 DIAGNOSIS — J301 Allergic rhinitis due to pollen: Secondary | ICD-10-CM | POA: Diagnosis not present

## 2023-04-21 DIAGNOSIS — J3089 Other allergic rhinitis: Secondary | ICD-10-CM | POA: Diagnosis not present

## 2023-04-23 ENCOUNTER — Encounter: Payer: Self-pay | Admitting: Student

## 2023-04-23 ENCOUNTER — Ambulatory Visit: Payer: Self-pay | Admitting: Student

## 2023-04-23 VITALS — BP 127/72 | HR 69 | Ht 65.0 in | Wt 156.2 lb

## 2023-04-23 DIAGNOSIS — G8929 Other chronic pain: Secondary | ICD-10-CM | POA: Diagnosis not present

## 2023-04-23 DIAGNOSIS — Z23 Encounter for immunization: Secondary | ICD-10-CM

## 2023-04-23 DIAGNOSIS — E1140 Type 2 diabetes mellitus with diabetic neuropathy, unspecified: Secondary | ICD-10-CM | POA: Diagnosis not present

## 2023-04-23 DIAGNOSIS — M5442 Lumbago with sciatica, left side: Secondary | ICD-10-CM

## 2023-04-23 DIAGNOSIS — I10 Essential (primary) hypertension: Secondary | ICD-10-CM

## 2023-04-23 DIAGNOSIS — I471 Supraventricular tachycardia, unspecified: Secondary | ICD-10-CM

## 2023-04-23 DIAGNOSIS — M5441 Lumbago with sciatica, right side: Secondary | ICD-10-CM

## 2023-04-23 MED ORDER — TRAMADOL HCL 50 MG PO TABS: ORAL_TABLET | ORAL | 0 refills | Status: DC

## 2023-04-23 NOTE — Progress Notes (Signed)
    SUBJECTIVE:   CHIEF COMPLAINT / HPI:   The patient presents for a follow-up visit for blood pressure control. She reports feeling well overall.  She has been taking valsartan and atenolol. The valsartan was recently increased from half a tablet to a full tablet on 04/13/23. The atenolol is primarily for SVT control which has worked well for her.  She previously took metoprolol years ago but is unsure why the switch was made. She also requests a refill of tramadol for her chronic back pain stating she tries to take it only once a day but sometimes needs it twice a day.  PERTINENT  PMH / PSH: HTN, T2DM, SVT  OBJECTIVE:   BP 127/72   Pulse 69   Ht 5\' 5"  (1.651 m)   Wt 156 lb 3.2 oz (70.9 kg)   SpO2 100%   BMI 25.99 kg/m    General: NAD, pleasant, able to participate in exam Cardiac: RRR, no murmurs. Respiratory: CTAB, normal effort, No wheezes, rales or rhonchi Abdomen: Bowel sounds present, nontender, nondistended, no hepatosplenomegaly. Extremities: no edema or cyanosis. Skin: warm and dry, no rashes noted Neuro: alert, no obvious focal deficits Psych: Normal affect and mood  ASSESSMENT/PLAN:   Essential hypertension Blood pressure well-controlled on repeat. -Continue valsartan 40 mg daily and atenolol 25 mg daily -Plan for BMP at next visit on 05/03/2023  SVT (supraventricular tachycardia) (HCC) Briefly discussed that metoprolol may be a safer medication rather than atenolol.  I will do some chart review before next visit to see if I can find a reason she was initially switched from metoprolol to atenolol.   Tramadol refilled for chronic back pain which is appropriate as has not been refilled since February  Patient to return for A1c and reassess kidney function on 4/21  Dr. Erick Alley, DO [place] [facility]

## 2023-04-23 NOTE — Patient Instructions (Signed)
 It was great to see you! Thank you for allowing me to participate in your care!  I recommend that you always bring your medications to each appointment as this makes it easy to ensure you are on the correct medications and helps [place] not miss when refills are needed.  Our plans for today:  - Continue current medications - return on 05/03/23 for A1c and to check kidney function - can consider switching from atenolol to metoprolol at a future visit and can discuss with cardiology if desired    Take care and seek immediate care sooner if you develop any concerns.   Dr. Erick Alley, DO [facility]

## 2023-04-23 NOTE — Assessment & Plan Note (Signed)
 Briefly discussed that metoprolol may be a safer medication rather than atenolol.  I will do some chart review before next visit to see if I can find a reason she was initially switched from metoprolol to atenolol.

## 2023-04-23 NOTE — Assessment & Plan Note (Signed)
 Blood pressure well-controlled on repeat. -Continue valsartan 40 mg daily and atenolol 25 mg daily -Plan for BMP at next visit on 05/03/2023

## 2023-04-25 ENCOUNTER — Other Ambulatory Visit: Payer: Self-pay | Admitting: Student

## 2023-04-25 DIAGNOSIS — E876 Hypokalemia: Secondary | ICD-10-CM

## 2023-04-26 DIAGNOSIS — J019 Acute sinusitis, unspecified: Secondary | ICD-10-CM | POA: Diagnosis not present

## 2023-04-26 DIAGNOSIS — H1045 Other chronic allergic conjunctivitis: Secondary | ICD-10-CM | POA: Diagnosis not present

## 2023-04-26 DIAGNOSIS — J3089 Other allergic rhinitis: Secondary | ICD-10-CM | POA: Diagnosis not present

## 2023-04-26 DIAGNOSIS — J4520 Mild intermittent asthma, uncomplicated: Secondary | ICD-10-CM | POA: Diagnosis not present

## 2023-05-03 ENCOUNTER — Ambulatory Visit: Payer: Self-pay | Admitting: Student

## 2023-05-03 ENCOUNTER — Telehealth: Payer: Self-pay

## 2023-05-03 NOTE — Telephone Encounter (Signed)
 Shoe fitting set for 6/3 at 10:45 ppw expires 6/30  Mylin Hirano CPed, CFo, CFm

## 2023-05-05 DIAGNOSIS — J301 Allergic rhinitis due to pollen: Secondary | ICD-10-CM | POA: Diagnosis not present

## 2023-05-05 DIAGNOSIS — J3089 Other allergic rhinitis: Secondary | ICD-10-CM | POA: Diagnosis not present

## 2023-05-11 ENCOUNTER — Ambulatory Visit: Admitting: Student

## 2023-05-11 ENCOUNTER — Telehealth: Payer: Self-pay | Admitting: Internal Medicine

## 2023-05-11 ENCOUNTER — Encounter: Payer: Self-pay | Admitting: Student

## 2023-05-11 VITALS — BP 122/75 | HR 76 | Ht 65.0 in | Wt 154.0 lb

## 2023-05-11 DIAGNOSIS — E876 Hypokalemia: Secondary | ICD-10-CM | POA: Diagnosis not present

## 2023-05-11 DIAGNOSIS — E1140 Type 2 diabetes mellitus with diabetic neuropathy, unspecified: Secondary | ICD-10-CM

## 2023-05-11 DIAGNOSIS — Z7984 Long term (current) use of oral hypoglycemic drugs: Secondary | ICD-10-CM

## 2023-05-11 DIAGNOSIS — I471 Supraventricular tachycardia, unspecified: Secondary | ICD-10-CM | POA: Diagnosis not present

## 2023-05-11 DIAGNOSIS — R7989 Other specified abnormal findings of blood chemistry: Secondary | ICD-10-CM | POA: Diagnosis not present

## 2023-05-11 LAB — POCT GLYCOSYLATED HEMOGLOBIN (HGB A1C): HbA1c, POC (controlled diabetic range): 8.7 % — AB (ref 0.0–7.0)

## 2023-05-11 NOTE — Patient Instructions (Addendum)
 It was great to see you! Thank you for allowing me to participate in your care!  I recommend that you always bring your medications to each appointment as this makes it easy to ensure you are on the correct medications and helps us  not miss when refills are needed.  Our plans for today:  - Call cardiology to make apt to discuss switching from atenolol  to metoprolol - try replacing soda with flavored sparkling water  - return for A1c in months  - STOP potassium supplement - return for lab only visit on Friday to check potassium (schedule on your way out)    We are checking some labs today, I will call you if they are abnormal will send you a MyChart message or a letter if they are normal.  If you do not hear about your labs in the next 2 weeks please let us  know.  Take care and seek immediate care sooner if you develop any concerns.   Dr. Glenn Lange, DO [facility]

## 2023-05-11 NOTE — Progress Notes (Signed)
    SUBJECTIVE:   CHIEF COMPLAINT / HPI:   [name] is a 73 year old female with hypertension, supraventricular tachycardia, and diabetes who presents for medication management and follow-up.  She takes atenolol  25 mg for supraventricular tachycardia and valsartan  40 mg for hypertension, with well-controlled blood pressure. She was previously on metoprolol 25 mg before the atenolol  but does not recall the reason for its discontinuation.  She takes a potassium supplement of 10 mEq daily for history of hypokalemia, last taken yesterday. There is no frequent urination.  Her diabetes management includes glipizide  10 mg twice daily and metformin  1000 mg twice daily. Her A1c has increased from 7.9% to 8.7% over the past three months. Blood sugar levels are 200-300 mg/dL before bed, attributed to her diet, including regular soda consumption.   PERTINENT  PMH / PSH: Elevated creatinine, T2DM  OBJECTIVE:   BP 122/75   Pulse 76   Ht 5\' 5"  (1.651 m)   Wt 154 lb (69.9 kg)   SpO2 100%   BMI 25.63 kg/m    General: NAD, pleasant, able to participate in exam Cardiac: RRR, no murmurs. Respiratory: normal effort Neuro: alert, no obvious focal deficits Psych: Normal affect and mood  ASSESSMENT/PLAN:   SVT (supraventricular tachycardia) (HCC) Managed with atenolol . Discussed potential switch to metoprolol for better outcomes. Cardiologist initially prescribed atenolol . - Discuss potential switch to metoprolol with cardiologist. - Schedule appointment with cardiologist.  Elevated serum creatinine Previous labs suggests possible chronic kidney disease. Normal microalbumin creatinine ratio is reassuring.  - Recheck kidney function today, if GFR still <60, will officially dx with CKD - Consider ultrasound to assess kidney changes  Hypokalemia hypokalemia managed with potassium supplement. Plan to discontinue supplement to assess necessity. - Stop potassium supplement. - Return for  lab-only visit on Friday to check potassium levels.  T2DM (type 2 diabetes mellitus) (HCC) A1c increased to 8.7%, indicating poor glycemic control. Current medications include glipizide  and metformin . Reports hyperglycemia and dietary indiscretions. Refuses injectables and had adverse effects with Jardiance . - Provide diabetic dietary handout. - Advise replacing soda with flavored sparkling water. - Recheck A1c in three months.     Dr. Glenn Lange, DO [facility]

## 2023-05-11 NOTE — Telephone Encounter (Signed)
 Ok to switch from my perspective.

## 2023-05-11 NOTE — Telephone Encounter (Signed)
 Left voicemail to return call to office

## 2023-05-11 NOTE — Telephone Encounter (Signed)
 New message   Patient saw Glenn Lange, PA today and stopped by to ask if she can stop atenolol  25mg  and start taking metoprolol.  Ms [name] wanted her to ask Dr Carolynne Citron.  Not sure why the switch.  Please call pt and advise

## 2023-05-11 NOTE — Assessment & Plan Note (Signed)
 Previous labs suggests possible chronic kidney disease. Normal microalbumin creatinine ratio is reassuring.  - Recheck kidney function today, if GFR still <60, will officially dx with CKD - Consider ultrasound to assess kidney changes

## 2023-05-11 NOTE — Assessment & Plan Note (Signed)
 Managed with atenolol . Discussed potential switch to metoprolol for better outcomes. Cardiologist initially prescribed atenolol . - Discuss potential switch to metoprolol with cardiologist. - Schedule appointment with cardiologist.

## 2023-05-11 NOTE — Assessment & Plan Note (Addendum)
 A1c increased to 8.7%, indicating poor glycemic control. Current medications include glipizide  and metformin . Reports hyperglycemia and dietary indiscretions. Refuses injectables and had adverse effects with Jardiance . - Provide diabetic dietary handout. - Advise replacing soda with flavored sparkling water. - Recheck A1c in three months.

## 2023-05-11 NOTE — Assessment & Plan Note (Signed)
 hypokalemia managed with potassium supplement. Plan to discontinue supplement to assess necessity. - Stop potassium supplement. - Return for lab-only visit on Friday to check potassium levels.

## 2023-05-12 ENCOUNTER — Telehealth: Payer: Self-pay

## 2023-05-12 DIAGNOSIS — J301 Allergic rhinitis due to pollen: Secondary | ICD-10-CM | POA: Diagnosis not present

## 2023-05-12 DIAGNOSIS — J3081 Allergic rhinitis due to animal (cat) (dog) hair and dander: Secondary | ICD-10-CM | POA: Diagnosis not present

## 2023-05-12 DIAGNOSIS — J3089 Other allergic rhinitis: Secondary | ICD-10-CM | POA: Diagnosis not present

## 2023-05-12 LAB — BASIC METABOLIC PANEL WITH GFR
BUN/Creatinine Ratio: 11 — ABNORMAL LOW (ref 12–28)
BUN: 11 mg/dL (ref 8–27)
CO2: 24 mmol/L (ref 20–29)
Calcium: 9.5 mg/dL (ref 8.7–10.3)
Chloride: 104 mmol/L (ref 96–106)
Creatinine, Ser: 1.03 mg/dL — ABNORMAL HIGH (ref 0.57–1.00)
Glucose: 148 mg/dL — ABNORMAL HIGH (ref 70–99)
Potassium: 3.9 mmol/L (ref 3.5–5.2)
Sodium: 142 mmol/L (ref 134–144)
eGFR: 58 mL/min/{1.73_m2} — ABNORMAL LOW (ref >59)

## 2023-05-12 NOTE — Telephone Encounter (Signed)
 Follow Up:     Patient says she is returning a call from yesterday.

## 2023-05-12 NOTE — Telephone Encounter (Signed)
 Spoke with pt and advised per Dr Carolynne Citron ok to change from Atenolol  to Metoprolol.  Pt states she will make her PCP aware and thanked RN for the call.

## 2023-05-12 NOTE — Telephone Encounter (Signed)
 Patient calls nurse line in regards to Atenolol .  She reports she spoke with her Cardiologist. She reports ok to change to Metoprolol.   This is also documented in Cardiologist telephone note.   Will forward to PCP to change.

## 2023-05-13 ENCOUNTER — Telehealth: Payer: Self-pay | Admitting: Student

## 2023-05-13 DIAGNOSIS — I471 Supraventricular tachycardia, unspecified: Secondary | ICD-10-CM

## 2023-05-13 MED ORDER — METOPROLOL SUCCINATE ER 25 MG PO TB24: 25.0000 mg | ORAL_TABLET | Freq: Every day | ORAL | 3 refills | Status: AC

## 2023-05-13 NOTE — Telephone Encounter (Signed)
 Called patient back.  She states she talked to her cardiologist who is okay with the switch from atenolol  to metoprolol  to manage her SVT.  Rx metoprolol  succinate 25 mg daily sent to pharmacy and patient advised to stop taking atenolol .  Appointment made for 5/16 with me to check heart rate and office and see how she is doing on this new medication.  Patient advised to check heart rate once a day and let me know if heart rate is consistently above 100 bpm

## 2023-05-14 ENCOUNTER — Other Ambulatory Visit

## 2023-05-14 DIAGNOSIS — E876 Hypokalemia: Secondary | ICD-10-CM

## 2023-05-15 LAB — BASIC METABOLIC PANEL WITH GFR
BUN/Creatinine Ratio: 15 (ref 12–28)
BUN: 17 mg/dL (ref 8–27)
CO2: 18 mmol/L — ABNORMAL LOW (ref 20–29)
Calcium: 9.2 mg/dL (ref 8.7–10.3)
Chloride: 107 mmol/L — ABNORMAL HIGH (ref 96–106)
Creatinine, Ser: 1.11 mg/dL — ABNORMAL HIGH (ref 0.57–1.00)
Glucose: 133 mg/dL — ABNORMAL HIGH (ref 70–99)
Potassium: 4.1 mmol/L (ref 3.5–5.2)
Sodium: 145 mmol/L — ABNORMAL HIGH (ref 134–144)
eGFR: 53 mL/min/{1.73_m2} — ABNORMAL LOW (ref >59)

## 2023-05-17 ENCOUNTER — Telehealth: Payer: Self-pay

## 2023-05-17 NOTE — Telephone Encounter (Signed)
 Patient calls nurse line reporting she missed a call.   She reports she "believes" Dr. Rochelle Chu was calling her about her results.   Will forward to PCP.

## 2023-05-18 ENCOUNTER — Encounter: Payer: Self-pay | Admitting: Student

## 2023-05-18 ENCOUNTER — Telehealth: Payer: Self-pay | Admitting: Student

## 2023-05-18 DIAGNOSIS — J3081 Allergic rhinitis due to animal (cat) (dog) hair and dander: Secondary | ICD-10-CM | POA: Diagnosis not present

## 2023-05-18 DIAGNOSIS — N1831 Chronic kidney disease, stage 3a: Secondary | ICD-10-CM

## 2023-05-18 DIAGNOSIS — J301 Allergic rhinitis due to pollen: Secondary | ICD-10-CM | POA: Diagnosis not present

## 2023-05-18 DIAGNOSIS — J3089 Other allergic rhinitis: Secondary | ICD-10-CM | POA: Diagnosis not present

## 2023-05-18 NOTE — Telephone Encounter (Signed)
 Attempted to call patient with lab results but she did not answer.  Left voicemail that she can call the office back. Also send a letter with results and recommended that she stop her potassium supplement as her potassium is within normal limits while not on the supplement.  I also advise that we get an ultrasound of her kidneys as she officially has a diagnosis of CKD3a.  If she returns my call, we can discuss this over the phone or wait until her next appointment in 10 days.

## 2023-05-24 ENCOUNTER — Ambulatory Visit (INDEPENDENT_AMBULATORY_CARE_PROVIDER_SITE_OTHER): Payer: Medicare Other

## 2023-05-24 VITALS — Ht 65.0 in | Wt 156.0 lb

## 2023-05-24 DIAGNOSIS — Z0000 Encounter for general adult medical examination without abnormal findings: Secondary | ICD-10-CM

## 2023-05-24 NOTE — Patient Instructions (Signed)
 [name] , Thank you for taking time out of your busy schedule to complete your Annual Wellness Visit with me. I enjoyed our conversation and look forward to speaking with you again next year. I, as well as your care team,  appreciate your ongoing commitment to your health goals. Please review the following plan we discussed and let me know if I can assist you in the future. Your Game plan/ To Do List    Referrals: If you haven't heard from the office you've been referred to, please reach out to them at the phone provided.   Follow up Visits: Next Medicare AWV with our clinical staff: 05/29/2024 at 1:40 pm PHONE VISIT   Have you seen your provider in the last 6 months (3 months if uncontrolled diabetes)? Yes Next Office Visit with your provider: 05/28/2023 at 2:50 pm  Clinician Recommendations:  Aim for 30 minutes of exercise or brisk walking, 6-8 glasses of water, and 5 servings of fruits and vegetables each day.       This is a list of the screening recommended for you and due dates:  Health Maintenance  Topic Date Due   Flu Shot  08/13/2023   Hemoglobin A1C  11/10/2023   Complete foot exam   12/15/2023   Eye exam for diabetics  12/16/2023   Yearly kidney health urinalysis for diabetes  03/18/2024   Yearly kidney function blood test for diabetes  05/13/2024   Medicare Annual Wellness Visit  05/23/2024   Mammogram  01/14/2025   Colon Cancer Screening  09/24/2027   DTaP/Tdap/Td vaccine (3 - Td or Tdap) 04/12/2029   Pneumonia Vaccine  Completed   DEXA scan (bone density measurement)  Completed   COVID-19 Vaccine  Completed   Hepatitis C Screening  Completed   Zoster (Shingles) Vaccine  Completed   HPV Vaccine  Aged Out   Meningitis B Vaccine  Aged Out    Advanced directives: (In Chart) A copy of your advanced directives are scanned into your chart should your provider ever need it. Advance Care Planning is important because it:  [x]  Makes sure you receive the medical care that  is consistent with your values, goals, and preferences  [x]  It provides guidance to your family and loved ones and reduces their decisional burden about whether or not they are making the right decisions based on your wishes.  Follow the link provided in your after visit summary or read over the paperwork we have mailed to you to help you started getting your Advance Directives in place. If you need assistance in completing these, please reach out to us  so that we can help you!  See attachments for Preventive Care and Fall Prevention Tips.

## 2023-05-24 NOTE — Progress Notes (Signed)
 Because this visit was a virtual/telehealth visit,  certain criteria was not obtained, such a blood pressure, CBG if applicable, and timed get up and go. Any medications not marked as "taking" were not mentioned during the medication reconciliation part of the visit. Any vitals not documented were not able to be obtained due to this being a telehealth visit or patient was unable to self-report a recent blood pressure reading due to a lack of equipment at home via telehealth. Vitals that have been documented are verbally provided by the patient.   Subjective:   [name] is a 73 y.o. who presents for a Medicare Wellness preventive visit.  As a reminder, Annual Wellness Visits don't include a physical exam, and some assessments may be limited, especially if this visit is performed virtually. We may recommend an in-person visit if needed.  Visit Complete: Virtual I connected with  [name] on 05/24/23 by a audio enabled telemedicine application and verified that I am speaking with the correct person using two identifiers.  Patient Location: Home  Provider Location: Office/Clinic  I discussed the limitations of evaluation and management by telemedicine. The patient expressed understanding and agreed to proceed.  Vital Signs: Because this visit was a virtual/telehealth visit, some criteria may be missing or patient reported. Any vitals not documented were not able to be obtained and vitals that have been documented are patient reported.  VideoDeclined- This patient declined Librarian, academic. Therefore the visit was completed with audio only.  Persons Participating in Visit: Patient.  AWV Questionnaire: No: Patient Medicare AWV questionnaire was not completed prior to this visit.  Cardiac Risk Factors include: advanced age (>41men, >74 women);diabetes mellitus;dyslipidemia;hypertension;smoking/ tobacco exposure     Objective:     Today's Vitals    05/24/23 1301  Weight: 156 lb (70.8 kg)  Height: 5\' 5"  (1.651 m)  PainSc: 0-No pain   Body mass index is 25.96 kg/m.     05/24/2023    1:03 PM 05/11/2023   10:14 AM 03/19/2023    1:47 PM 02/08/2023    8:57 AM 12/08/2022    8:37 AM 07/03/2022    1:49 PM 05/12/2022    1:12 PM  Advanced Directives  Does Patient Have a Medical Advance Directive? Yes No No No No No No  Type of Estate agent of [place];Living will        Does patient want to make changes to medical advance directive? No - Patient declined        Copy of Healthcare Power of Attorney in Chart? Yes - validated most recent copy scanned in chart (See row information)        Would patient like information on creating a medical advance directive? No - Patient declined No - Patient declined No - Patient declined No - Patient declined  No - Patient declined No - Patient declined    Current Medications (verified) Outpatient Encounter Medications as of 05/24/2023  Medication Sig   Accu-Chek Softclix Lancets lancets USE AS DIRECTED 3 TIMES A DAY   aspirin  81 MG chewable tablet Chew 1 tablet (81 mg total) by mouth daily.   atorvastatin  (LIPITOR) 40 MG tablet Take 1 tablet by mouth once daily   baclofen  (LIORESAL ) 10 MG tablet Take 10 mg by mouth daily.   cetirizine (ZYRTEC) 10 MG tablet Take 10 mg by mouth daily.   citalopram  (CELEXA ) 20 MG tablet TAKE 1 TABLET BY MOUTH EVERY DAY   EPINEPHrine  (EPIPEN  2-PAK)  0.3 mg/0.3 mL IJ SOAJ injection as directed Injection PRN for systemic reaction 30 days   EPINEPHrine  (EPIPEN  2-PAK) 0.3 mg/0.3 mL IJ SOAJ injection Inject as needed as directed   EPINEPHrine  (EPIPEN  2-PAK) 0.3 mg/0.3 mL IJ SOAJ injection Use as directed as needed   glipiZIDE  (GLUCOTROL ) 10 MG tablet TAKE 1 TABLET BY MOUTH 2 TIMES A DAY BEFORE A MEAL   glucose blood (ONETOUCH ULTRA) test strip Check fasting blood sugar once daily or more frequently if feeling unwell.   ipratropium (ATROVENT) 0.03 % nasal  spray Place 2 sprays into both nostrils daily at 6 (six) AM.   magnesium  oxide (MAG-OX) 400 (240 Mg) MG tablet TAKE ONE TABLET BY MOUTH DAILY   metFORMIN  (GLUCOPHAGE ) 1000 MG tablet Take 1 tablet (1,000 mg total) by mouth 2 (two) times daily with a meal.   metoprolol  succinate (TOPROL -XL) 25 MG 24 hr tablet Take 1 tablet (25 mg total) by mouth at bedtime.   Multiple Vitamins-Minerals (CENTRUM PO) Take 1 tablet by mouth daily.   Olopatadine  HCl 0.6 % SOLN Place 1 g into both eyes daily.   pantoprazole  (PROTONIX ) 40 MG tablet TAKE 2 TABLETS BY MOUTH EVERY DAY   Polyethylene Glycol 400 (BLINK TEARS OP) Place 1 drop into both eyes daily as needed.   potassium chloride  (KLOR-CON ) 10 MEQ tablet TAKE 1 TABLET BY MOUTH DAILY   simethicone  (MYLICON) 125 MG chewable tablet Chew 125 mg by mouth every 6 (six) hours as needed for flatulence.   sucralfate  (CARAFATE ) 1 g tablet Take 1 tablet (1 g total) by mouth 4 (four) times daily -  with meals and at bedtime.   traMADol  (ULTRAM ) 50 MG tablet TAKE 1 TABLET BY MOUTH 3 TIMES A DAY AS NEEDED FOR LOW BACK PAIN   Triamcinolone  Acetonide (NASACORT  AQ NA) Place 1 Inhaler into the nose daily as needed.   valsartan  (DIOVAN ) 40 MG tablet Take 1 tablet (40 mg total) by mouth daily.   No facility-administered encounter medications on file as of 05/24/2023.    Allergies (verified) Alcohol swabs [isopropyl alcohol], Penicillins, Prednisone , and Amoxicillin-pot clavulanate   History: Past Medical History:  Diagnosis Date   Allergy    Anxiety    AR (allergic rhinitis)    Asthma    on meds   Cataract    CHF (congestive heart failure) (HCC)    hx of   Colon polyps    hyperplastic   Disturbance of skin sensation    facial paresthesia; left   DM2 (diabetes mellitus, type 2) (HCC)    on meds   Dyslipidemia    Fatty liver    GERD (gastroesophageal reflux disease)    on meds   Hemorrhoids    Hyperlipidemia    on meds   Hypertension    essential, benign     Insomnia    Menopausal syndrome    Palpitations    hx   Routine general medical examination at a health care facility    Screening for malignant neoplasm of the cervix    Skin lesion    SVT (supraventricular tachycardia) (HCC)    sx   Tobacco abuse    Weakness    Past Surgical History:  Procedure Laterality Date   ABDOMINAL HYSTERECTOMY  1990   For fibroids benign.  Still has ovaries   ablation for SVT  09/2007   Dr. Carolynne Citron   APPENDECTOMY     CATARACT EXTRACTION, BILATERAL     CESAREAN SECTION  COLONOSCOPY  2019   JP-MAC-suprep(Exc)-TA -5 yr recall   dental extractions     HEMORRHOID BANDING  2014   w/Dr.Medoff   SIGMOIDOSCOPY  2004   Family History  Problem Relation Age of Onset   Hypertension Mother    Stroke Mother    Diabetes Mother    Cancer Mother    Heart attack Father 75   Diabetes Brother    Diabetes Brother    Coronary artery disease Other        female, 1st degree relative <50   Colon cancer Neg Hx    Esophageal cancer Neg Hx    Colon polyps Neg Hx    Rectal cancer Neg Hx    Stomach cancer Neg Hx    Social History   Socioeconomic History   Marital status: Widowed    Spouse name: Not on file   Number of children: 3   Years of education: 56   Highest education level: Some college, no degree  Occupational History   Occupation: Retired    Comment: Camera operator  Tobacco Use   Smoking status: Every Day    Current packs/day: 0.50    Types: Cigarettes    Passive exposure: Current   Smokeless tobacco: Never  Vaping Use   Vaping status: Never Used  Substance and Sexual Activity   Alcohol use: Not Currently   Drug use: No   Sexual activity: Not Currently  Other Topics Concern   Not on file  Social History Narrative   Married, does not get regular exercise. On disability.    2 miscarriages, one at 4 and one at 5 months; death at 1 day, unsure of cause.       Health Care POA: on file   Emergency Contact: Val Garin (friend) [phone] Aloma Jaksch (friend's daughter) [phone]   End of Life Plan: Gave pt pamphlet   Lives in 2 story apartment. Handrails on stairs. Smoke alarms, has throw rugs with backing on them. No grab bars in bathroom.   Who lives with you: husband   Any pets: 0   Diet: pt has varied diet. Eats vegetables, some fruit, and meat.   Exercise: pt is disabled and does not have a regular exercise routine   Seatbelts: Pt reports wearing seatbelt when in vehicle   Sun Exposure/Protection: Pt reports wearing a hat, sunglasses and facial sunscreen when outside   Hobbies: shopping    Social Drivers of Health   Financial Resource Strain: Low Risk  (05/24/2023)   Overall Financial Resource Strain (CARDIA)    Difficulty of Paying Living Expenses: Not hard at all  Food Insecurity: No Food Insecurity (05/24/2023)   Hunger Vital Sign    Worried About Running Out of Food in the Last Year: Never true    Ran Out of Food in the Last Year: Never true  Transportation Needs: No Transportation Needs (05/24/2023)   PRAPARE - Administrator, Civil Service (Medical): No    Lack of Transportation (Non-Medical): No  Physical Activity: Sufficiently Active (05/24/2023)   Exercise Vital Sign    Days of Exercise per Week: 5 days    Minutes of Exercise per Session: 30 min  Stress: No Stress Concern Present (05/24/2023)   [employer] of Occupational Health - Occupational Stress Questionnaire    Feeling of Stress : Not at all  Social Connections: Moderately Isolated (05/24/2023)   Social Connection and Isolation Panel [NHANES]    Frequency of Communication with Friends  and Family: More than three times a week    Frequency of Social Gatherings with Friends and Family: Twice a week    Attends Religious Services: Never    Database administrator or Organizations: Yes    Attends Engineer, structural: More than 4 times per year    Marital Status: Widowed    Tobacco  Counseling Ready to quit: Not Answered Counseling given: Not Answered    Clinical Intake:  Pre-visit preparation completed: Yes  Pain : No/denies pain Pain Score: 0-No pain     BMI - recorded: 25.96 Nutritional Status: BMI 25 -29 Overweight Nutritional Risks: None Diabetes: Yes CBG done?: No Did pt. bring in CBG monitor from home?: No  Lab Results  Component Value Date   HGBA1C 8.7 (A) 05/11/2023   HGBA1C 7.9 (A) 02/02/2023   HGBA1C 7.5 (A) 11/10/2022     How often do you need to have someone help you when you read instructions, pamphlets, or other written materials from your doctor or pharmacy?: 1 - Never What is the last grade level you completed in school?: SOME COLLEGE  Interpreter Needed?: No  Information entered by :: Nayshawn Mesta N. Welby Montminy, LPN.   Activities of Daily Living     05/24/2023    1:06 PM  In your present state of health, do you have any difficulty performing the following activities:  Hearing? 0  Vision? 0  Difficulty concentrating or making decisions? 0  Comment BSE: READING, GAMES ON PHONE, PUZZLES  Walking or climbing stairs? 0  Dressing or bathing? 0  Doing errands, shopping? 0  Preparing Food and eating ? N  Using the Toilet? N  In the past six months, have you accidently leaked urine? N  Do you have problems with loss of bowel control? N  Managing your Medications? N  Managing your Finances? N  Housekeeping or managing your Housekeeping? N    Patient Care Team: Glenn Lange, DO as PCP - General (Family Medicine) Zula Hitch, MD as Consulting Physician (Ophthalmology) Seabron Cypress, Timmothy Foots, MD as Consulting Physician (Allergy and Immunology) Alvina Axon, MD as Consulting Physician (Ophthalmology) Meylor, Rozelle Corning, DC (Chiropractic Medicine) Ruffin Cotton, DPM as Consulting Physician (Podiatry) Luella Sager, DPM as Consulting Physician (Podiatry)  Indicate any recent Medical Services you may have received from other than  [facility] providers in the past year (date may be approximate).     Assessment:    This is a routine wellness examination for [place]ynn.  Hearing/Vision screen Hearing Screening - Comments:: Denies hearing difficulties.  Vision Screening - Comments:: Wears rx glasses - up to date with routine eye exams with Pauline Bos, OD.    Goals Addressed               This Visit's Progress     Patient Stated (pt-stated)        05/24/2023: My goal is to lower my HgA1C and keep my blood sugar numbers under control.       Depression Screen     05/24/2023    1:08 PM 05/11/2023   10:02 AM 04/23/2023   10:57 AM 04/13/2023   10:03 AM 04/13/2023    9:52 AM 03/05/2023    1:22 PM 02/08/2023    8:57 AM  PHQ 2/9 Scores  PHQ - 2 Score 2 1 2    1   PHQ- 9 Score 4 4 5    4   Exception Documentation    Patient refusal Patient refusal Patient refusal  Fall Risk     05/24/2023    1:05 PM 05/11/2023   10:13 AM 04/23/2023   10:55 AM 04/13/2023    9:52 AM 03/05/2023    1:22 PM  Fall Risk   Falls in the past year? 1 0 0 0 0  Number falls in past yr: 0 0 0 0 0  Injury with Fall? 0 0 0 0 0  Risk for fall due to : No Fall Risks      Follow up Falls prevention discussed;Falls evaluation completed        MEDICARE RISK AT HOME:  Medicare Risk at Home Any stairs in or around the home?: Yes If so, are there any without handrails?: No Home free of loose throw rugs in walkways, pet beds, electrical cords, etc?: Yes Adequate lighting in your home to reduce risk of falls?: Yes Life alert?: No Use of a cane, walker or w/c?: No Grab bars in the bathroom?: No Shower chair or bench in shower?: No Elevated toilet seat or a handicapped toilet?: No  TIMED UP AND GO:  Was the test performed?  No  Cognitive Function: 6CIT completed    05/24/2023    1:07 PM 11/04/2017    3:45 PM 06/22/2013   10:00 AM  MMSE - Mini Mental State Exam  Not completed: Unable to complete    Orientation to time  5 5  Orientation to Place   5 5  Registration  3 3  Attention/ Calculation  5 5  Recall  3 3  Language- name 2 objects  2 2  Language- repeat  1 1  Language- follow 3 step command  3 3  Language- read & follow direction  1 1  Write a sentence  1 1  Copy design  1 1  Total score  30 30        05/24/2023    1:03 PM 11/04/2017    3:45 PM  6CIT Screen  What Year? 0 points 0 points  What month? 0 points 0 points  What time? 0 points 0 points  Count back from 20 0 points 0 points  Months in reverse 0 points 0 points  Repeat phrase 0 points 0 points  Total Score 0 points 0 points    Immunizations Immunization History  Administered Date(s) Administered   Fluad Quad(high Dose 65+) 09/26/2020, 10/22/2021   Fluad Trivalent(High Dose 65+) 09/28/2022   Influenza Whole 01/12/2005, 10/03/2007, 11/11/2012   Influenza, High Dose Seasonal PF 04/05/2013, 10/17/2013, 10/29/2015, 10/17/2018, 02/13/2020   Influenza,inj,Quad PF,6+ Mos 10/08/2014, 10/18/2017   Influenza-Unspecified 10/13/2015, 10/17/2018   PFIZER Comirnaty (Gray Top)Covid-19 Tri-Sucrose Vaccine 05/22/2020   PFIZER(Purple Top)SARS-COV-2 Vaccination 03/07/2019, 03/28/2019, 10/16/2019, 02/13/2020   PNEUMOCOCCAL CONJUGATE-20 11/10/2022   Pfizer Covid-19 Vaccine Bivalent Booster 56yrs & up 11/18/2020, 02/03/2021   Pfizer(Comirnaty )Fall Seasonal Vaccine 12 years and older 10/22/2021, 09/28/2022, 04/23/2023   Pneumococcal Conjugate-13 10/18/2017   Pneumococcal Polysaccharide-23 10/12/2001, 09/09/2012, 06/02/2016, 03/16/2018, 11/27/2018, 05/10/2019, 07/03/2019, 02/13/2020   Respiratory Syncytial Virus Vaccine ,Recomb Aduvanted(Arexvy ) 10/13/2021   Td 03/15/2008   Tdap 04/13/2019   Zoster Recombinant(Shingrix ) 07/11/2012, 01/20/2021    Screening Tests Health Maintenance  Topic Date Due   INFLUENZA VACCINE  08/13/2023   HEMOGLOBIN A1C  11/10/2023   FOOT EXAM  12/15/2023   OPHTHALMOLOGY EXAM  12/16/2023   Diabetic kidney evaluation - Urine ACR  03/18/2024    Diabetic kidney evaluation - eGFR measurement  05/13/2024   Medicare Annual Wellness (AWV)  05/23/2024   MAMMOGRAM  01/14/2025  Colonoscopy  09/24/2027   DTaP/Tdap/Td (3 - Td or Tdap) 04/12/2029   Pneumonia Vaccine 39+ Years old  Completed   DEXA SCAN  Completed   COVID-19 Vaccine  Completed   Hepatitis C Screening  Completed   Zoster Vaccines- Shingrix   Completed   HPV VACCINES  Aged Out   Meningococcal B Vaccine  Aged Out    Health Maintenance  There are no preventive care reminders to display for this patient.  Health Maintenance Items Addressed: Yes Patient is current and up to date with health care gaps.  Additional Screening:  Vision Screening: Recommended annual ophthalmology exams for early detection of glaucoma and other disorders of the eye.  Dental Screening: Recommended annual dental exams for proper oral hygiene  Community Resource Referral / Chronic Care Management: CRR required this visit?  No   CCM required this visit?  No   Plan:    I have personally reviewed and noted the following in the patient's chart:   Medical and social history Use of alcohol, tobacco or illicit drugs  Current medications and supplements including opioid prescriptions. Patient is not currently taking opioid prescriptions. Functional ability and status Nutritional status Physical activity Advanced directives List of other physicians Hospitalizations, surgeries, and ER visits in previous 12 months Vitals Screenings to include cognitive, depression, and falls Referrals and appointments  In addition, I have reviewed and discussed with patient certain preventive protocols, quality metrics, and best practice recommendations. A written personalized care plan for preventive services as well as general preventive health recommendations were provided to patient.   Margette Sheldon, LPN   1/61/0960   After Visit Summary: (Declined) Due to this being a telephonic visit, with  patients personalized plan was offered to patient but patient Declined AVS at this time   Nurse Notes: Patient is current and up to date with gaps in care.

## 2023-05-27 DIAGNOSIS — J301 Allergic rhinitis due to pollen: Secondary | ICD-10-CM | POA: Diagnosis not present

## 2023-05-27 DIAGNOSIS — J3089 Other allergic rhinitis: Secondary | ICD-10-CM | POA: Diagnosis not present

## 2023-05-28 ENCOUNTER — Ambulatory Visit: Payer: Self-pay | Admitting: Student

## 2023-05-28 ENCOUNTER — Other Ambulatory Visit: Payer: Self-pay

## 2023-05-28 ENCOUNTER — Encounter: Payer: Self-pay | Admitting: Student

## 2023-05-28 VITALS — BP 127/68 | HR 79 | Ht 65.0 in | Wt 153.8 lb

## 2023-05-28 DIAGNOSIS — I471 Supraventricular tachycardia, unspecified: Secondary | ICD-10-CM | POA: Diagnosis not present

## 2023-05-28 DIAGNOSIS — L723 Sebaceous cyst: Secondary | ICD-10-CM | POA: Diagnosis not present

## 2023-05-28 DIAGNOSIS — N1831 Chronic kidney disease, stage 3a: Secondary | ICD-10-CM

## 2023-05-28 NOTE — Patient Instructions (Addendum)
 It was great to see you! Thank you for allowing me to participate in your care!  I recommend that you always bring your medications to each appointment as this makes it easy to ensure you are on the correct medications and helps us  not miss when refills are needed.  Our plans for today:  - Go to renal ultrasound apt  - Continue current medications  - Return in July for A1c   Take care and seek immediate care sooner if you develop any concerns.   Dr. Glenn Lange, DO [facility]

## 2023-05-28 NOTE — Assessment & Plan Note (Addendum)
 Well-controlled on metoprolol  - Continue metoprolol  succinate 25 mg daily - Continue following cardiology

## 2023-05-28 NOTE — Progress Notes (Signed)
    SUBJECTIVE:   CHIEF COMPLAINT / HPI:   [name] is a 73 year old female with chronic kidney disease who presents for follow-up and medication management.  She is currently taking metoprolol  after switching from atenolol  for SVT and experiences no symptoms.  Her potassium level is 4.1 without supplementation and she has been taken off potassium supplement.   Her chronic kidney disease is stage 3A with a GFR in the fifties. Six months ago, her creatinine was 0.97, and her GFR was 63. Her kidney function decreased significantly after taking chlorthalidone  (unsure if this was cause) but has since improved, though not to baseline. She is on valsartan  and also takes aspirin .  She smokes half a pack of cigarettes a day.  She has a sebaceous cyst on her left shoulder which has been there for years.  It secretes black discharge if she squeezes it.  It does not hurt but she does not like the way it looks and would like it removed.  PERTINENT  PMH / PSH: SVT, HTN, T2DM, CKD  OBJECTIVE:   BP 127/68   Pulse 79   Ht 5\' 5"  (1.651 m)   Wt 153 lb 12.8 oz (69.8 kg)   SpO2 100%   BMI 25.59 kg/m    General: NAD, pleasant, able to participate in exam Cardiac: RRR, no murmurs. Respiratory: Normal effort Skin: Cyst on left shoulder with small hole exposing dark material.  No surrounding warmth, erythema and nondraining. see photo Neuro: alert, no obvious focal deficits Psych: Normal affect and mood   ASSESSMENT/PLAN:   CKD (chronic kidney disease) Stage 3A CKD with GFR in the 50s, stable with no significant proteinuria. No nephrology referral needed. - Order renal ultrasound to assess for structural abnormalities. - Continue valsartan  for kidney protection. - Ensure blood pressure control. - Advise smoking cessation.   Sebaceous cyst Sebaceous cyst bothersome due to appearance, patient desires removal. - Schedule cyst removal   SVT (supraventricular tachycardia)  (HCC) Well-controlled on metoprolol  - Continue metoprolol  succinate 25 mg daily - Continue following cardiology     Dr. Glenn Lange, DO [facility]

## 2023-05-28 NOTE — Assessment & Plan Note (Signed)
 Stage 3A CKD with GFR in the 50s, stable with no significant proteinuria. No nephrology referral needed. - Order renal ultrasound to assess for structural abnormalities. - Continue valsartan  for kidney protection. - Ensure blood pressure control. - Advise smoking cessation.

## 2023-05-28 NOTE — Assessment & Plan Note (Signed)
 Sebaceous cyst bothersome due to appearance, patient desires removal. - Schedule cyst removal

## 2023-06-01 ENCOUNTER — Ambulatory Visit (HOSPITAL_COMMUNITY)
Admission: RE | Admit: 2023-06-01 | Discharge: 2023-06-01 | Disposition: A | Discharge status: Home / Self Care | Source: Ambulatory Visit | Attending: Family Medicine | Admitting: Family Medicine

## 2023-06-01 DIAGNOSIS — N1831 Chronic kidney disease, stage 3a: Secondary | ICD-10-CM | POA: Insufficient documentation

## 2023-06-01 DIAGNOSIS — R93422 Abnormal radiologic findings on diagnostic imaging of left kidney: Secondary | ICD-10-CM | POA: Diagnosis not present

## 2023-06-03 ENCOUNTER — Emergency Department (HOSPITAL_BASED_OUTPATIENT_CLINIC_OR_DEPARTMENT_OTHER)
Admission: EM | Admit: 2023-06-03 | Discharge: 2023-06-04 | Disposition: A | Discharge status: Home / Self Care | Attending: Emergency Medicine | Admitting: Emergency Medicine

## 2023-06-03 ENCOUNTER — Other Ambulatory Visit: Payer: Self-pay

## 2023-06-03 DIAGNOSIS — J45909 Unspecified asthma, uncomplicated: Secondary | ICD-10-CM | POA: Diagnosis not present

## 2023-06-03 DIAGNOSIS — S63501A Unspecified sprain of right wrist, initial encounter: Secondary | ICD-10-CM | POA: Insufficient documentation

## 2023-06-03 DIAGNOSIS — E1122 Type 2 diabetes mellitus with diabetic chronic kidney disease: Secondary | ICD-10-CM | POA: Diagnosis not present

## 2023-06-03 DIAGNOSIS — Z79899 Other long term (current) drug therapy: Secondary | ICD-10-CM | POA: Diagnosis not present

## 2023-06-03 DIAGNOSIS — Y92481 Parking lot as the place of occurrence of the external cause: Secondary | ICD-10-CM | POA: Diagnosis not present

## 2023-06-03 DIAGNOSIS — S8391XA Sprain of unspecified site of right knee, initial encounter: Secondary | ICD-10-CM | POA: Diagnosis not present

## 2023-06-03 DIAGNOSIS — I509 Heart failure, unspecified: Secondary | ICD-10-CM | POA: Insufficient documentation

## 2023-06-03 DIAGNOSIS — R220 Localized swelling, mass and lump, head: Secondary | ICD-10-CM | POA: Insufficient documentation

## 2023-06-03 DIAGNOSIS — J301 Allergic rhinitis due to pollen: Secondary | ICD-10-CM | POA: Diagnosis not present

## 2023-06-03 DIAGNOSIS — N189 Chronic kidney disease, unspecified: Secondary | ICD-10-CM | POA: Diagnosis not present

## 2023-06-03 DIAGNOSIS — J3089 Other allergic rhinitis: Secondary | ICD-10-CM | POA: Diagnosis not present

## 2023-06-03 DIAGNOSIS — S63502A Unspecified sprain of left wrist, initial encounter: Secondary | ICD-10-CM | POA: Diagnosis not present

## 2023-06-03 DIAGNOSIS — I130 Hypertensive heart and chronic kidney disease with heart failure and stage 1 through stage 4 chronic kidney disease, or unspecified chronic kidney disease: Secondary | ICD-10-CM | POA: Diagnosis not present

## 2023-06-03 DIAGNOSIS — Z7982 Long term (current) use of aspirin: Secondary | ICD-10-CM | POA: Diagnosis not present

## 2023-06-03 DIAGNOSIS — S6991XA Unspecified injury of right wrist, hand and finger(s), initial encounter: Secondary | ICD-10-CM | POA: Diagnosis present

## 2023-06-03 DIAGNOSIS — S8392XA Sprain of unspecified site of left knee, initial encounter: Secondary | ICD-10-CM | POA: Insufficient documentation

## 2023-06-03 DIAGNOSIS — J3081 Allergic rhinitis due to animal (cat) (dog) hair and dander: Secondary | ICD-10-CM | POA: Diagnosis not present

## 2023-06-03 DIAGNOSIS — W1839XA Other fall on same level, initial encounter: Secondary | ICD-10-CM | POA: Diagnosis not present

## 2023-06-03 NOTE — ED Triage Notes (Signed)
 Pt POV ambulatory to triage after mechanical fall in [place] parking lot. Fell forward on to knees, swelling noted bilaterally, able to bend without severe pain. Denies hitting head, no LOC, no thinners.

## 2023-06-04 ENCOUNTER — Emergency Department (HOSPITAL_BASED_OUTPATIENT_CLINIC_OR_DEPARTMENT_OTHER): Admitting: Radiology

## 2023-06-04 ENCOUNTER — Ambulatory Visit: Payer: Self-pay | Admitting: Student

## 2023-06-04 DIAGNOSIS — M25531 Pain in right wrist: Secondary | ICD-10-CM | POA: Diagnosis not present

## 2023-06-04 DIAGNOSIS — M25562 Pain in left knee: Secondary | ICD-10-CM | POA: Diagnosis not present

## 2023-06-04 DIAGNOSIS — M25561 Pain in right knee: Secondary | ICD-10-CM | POA: Diagnosis not present

## 2023-06-04 DIAGNOSIS — M1712 Unilateral primary osteoarthritis, left knee: Secondary | ICD-10-CM | POA: Diagnosis not present

## 2023-06-04 DIAGNOSIS — M1811 Unilateral primary osteoarthritis of first carpometacarpal joint, right hand: Secondary | ICD-10-CM | POA: Diagnosis not present

## 2023-06-04 NOTE — ED Provider Notes (Signed)
 [place] EMERGENCY DEPARTMENT AT [facility] Provider Note   CSN: [id number] Arrival date & time: 06/03/23  2058     History  Chief complaint-fall  [name] is a 73 y.o. female.  The history is provided by the patient.   Patient with history of hypertension presents after accidental fall.  Patient was pushing a cart at [facility] when she hit a pole and fell. She landed on both of her knees and then landed on her right side.  She twisted her wrist, and then her hand did hit her chin.  No LOC, no headache, no vomiting.  She is not on anticoagulation She reports pain is mostly in the right wrist and both knees.  She has been ambulatory No dental or mouth injury no nosebleeds   Past Medical History:  Diagnosis Date   Allergy    Anxiety    AR (allergic rhinitis)    Asthma    on meds   Cataract    CHF (congestive heart failure) (HCC)    hx of   CKD (chronic kidney disease) 03/05/2023   Colon polyps    hyperplastic   Disturbance of skin sensation    facial paresthesia; left   DM2 (diabetes mellitus, type 2) (HCC)    on meds   Dyslipidemia    Fatty liver    GERD (gastroesophageal reflux disease)    on meds   Hemorrhoids    Hyperlipidemia    on meds   Hypertension    essential, benign    Insomnia    Menopausal syndrome    Palpitations    hx   Routine general medical examination at a health care facility    Screening for malignant neoplasm of the cervix    Skin lesion    SVT (supraventricular tachycardia) (HCC)    sx   Tobacco abuse    Weakness     Home Medications Prior to Admission medications   Medication Sig Start Date End Date Taking? Authorizing Provider  Accu-Chek Softclix Lancets lancets USE AS DIRECTED 3 TIMES A DAY 03/29/23   Veronia Goon, DO  aspirin  81 MG chewable tablet Chew 1 tablet (81 mg total) by mouth daily. 03/02/17   Michae Aden, MD  atorvastatin  (LIPITOR) 40 MG tablet Take 1 tablet by mouth once daily 12/09/22    Glenn Lange, DO  baclofen  (LIORESAL ) 10 MG tablet Take 10 mg by mouth daily. 08/31/18   [provider]  cetirizine (ZYRTEC) 10 MG tablet Take 10 mg by mouth daily.    [provider]  citalopram  (CELEXA ) 20 MG tablet TAKE 1 TABLET BY MOUTH EVERY DAY 07/06/22   Glenn Lange, DO  EPINEPHrine  (EPIPEN  2-PAK) 0.3 mg/0.3 mL IJ SOAJ injection as directed Injection PRN for systemic reaction 30 days 07/07/21     EPINEPHrine  (EPIPEN  2-PAK) 0.3 mg/0.3 mL IJ SOAJ injection Inject as needed as directed 12/14/22     EPINEPHrine  (EPIPEN  2-PAK) 0.3 mg/0.3 mL IJ SOAJ injection Use as directed as needed 12/14/22     glipiZIDE  (GLUCOTROL ) 10 MG tablet TAKE 1 TABLET BY MOUTH 2 TIMES A DAY BEFORE A MEAL 03/29/23   Veronia Goon, DO  glucose blood (ONETOUCH ULTRA) test strip Check fasting blood sugar once daily or more frequently if feeling unwell. 07/27/22   Glenn Lange, DO  ipratropium (ATROVENT) 0.03 % nasal spray Place 2 sprays into both nostrils daily at 6 (six) AM.    [provider]  magnesium  oxide (MAG-OX) 400 (240 Mg)  MG tablet TAKE ONE TABLET BY MOUTH DAILY 07/18/20   [name], Jeneane Miracle, DO  metFORMIN  (GLUCOPHAGE ) 1000 MG tablet Take 1 tablet (1,000 mg total) by mouth 2 (two) times daily with a meal. 01/01/23   Glenn Lange, DO  metoprolol  succinate (TOPROL -XL) 25 MG 24 hr tablet Take 1 tablet (25 mg total) by mouth at bedtime. 05/13/23   Glenn Lange, DO  Multiple Vitamins-Minerals (CENTRUM PO) Take 1 tablet by mouth daily.    [provider]  Olopatadine  HCl 0.6 % SOLN Place 1 g into both eyes daily.    [provider]  pantoprazole  (PROTONIX ) 40 MG tablet TAKE 2 TABLETS BY MOUTH EVERY DAY 03/15/23   Glenn Lange, DO  Polyethylene Glycol 400 (BLINK TEARS OP) Place 1 drop into both eyes daily as needed.    [provider]  potassium chloride  (KLOR-CON ) 10 MEQ tablet TAKE 1 TABLET BY MOUTH DAILY 04/27/23   Glenn Lange, DO  simethicone  (MYLICON) 125 MG chewable  tablet Chew 125 mg by mouth every 6 (six) hours as needed for flatulence.    [provider]  sucralfate  (CARAFATE ) 1 g tablet Take 1 tablet (1 g total) by mouth 4 (four) times daily -  with meals and at bedtime. 12/04/22   Glenn Lange, DO  traMADol  (ULTRAM ) 50 MG tablet TAKE 1 TABLET BY MOUTH 3 TIMES A DAY AS NEEDED FOR LOW BACK PAIN 04/23/23   Glenn Lange, DO  Triamcinolone  Acetonide (NASACORT  AQ NA) Place 1 Inhaler into the nose daily as needed.    [provider]  valsartan  (DIOVAN ) 40 MG tablet Take 1 tablet (40 mg total) by mouth daily. 03/09/23   Glenn Lange, DO      Allergies    Alcohol swabs [isopropyl alcohol], Penicillins, Prednisone , and Amoxicillin-pot clavulanate    Review of Systems   Review of Systems  Respiratory:  Negative for shortness of breath.   Cardiovascular:  Negative for chest pain.    Physical Exam Updated Vital Signs BP (!) 153/93   Pulse 89   Temp 98.5 F (36.9 C) (Oral)   Resp 16   Ht 1.651 m (5\' 5" )   Wt 70.8 kg   SpO2 99%   BMI 25.96 kg/m  Physical Exam CONSTITUTIONAL: Well developed/well nourished, no signs of head trauma HEAD: Normocephalic/atraumatic EYES: EOMI/PERRL ENMT: Mucous membranes moist, face is stable, no dental injury, no nasal injury, no malocclusion minimal swelling to the chin, no abrasions or step-offs NECK: supple no meningeal signs SPINE/BACK:entire spine nontender No bruising/crepitance/stepoffs noted to spine CV: S1/S2 noted, no murmurs/rubs/gallops noted LUNGS: Lungs are clear to auscultation bilaterally, no apparent distress Chest-no bruising or tenderness ABDOMEN: soft, nontender NEURO: Pt is awake/alert/appropriate, moves all extremitiesx4.  No facial droop.   EXTREMITIES: pulses normal/equal, full ROM, mild tenderness noted right wrist with no deformities No snuffbox tenderness Mild tenderness and abrasions are noted to both knees. All other extremities/joints palpated/ranged and  nontender Pelvis stable SKIN: warm, color normal PSYCH: no abnormalities of mood noted, alert and oriented to situation  ED Results / Procedures / Treatments   Labs (all labs ordered are listed, but only abnormal results are displayed) Labs Reviewed - No data to display  EKG None  Radiology DG Wrist Complete Right Result Date: 06/04/2023 CLINICAL DATA:  Fall, right wrist pain EXAM: RIGHT WRIST - COMPLETE 3+ VIEW COMPARISON:  None Available. FINDINGS: Normal alignment. No acute fracture or dislocation. Advanced degenerative arthritis is seen at the triscaphe and first carpometacarpal joint with prominent  osteophyte formation at the [facility] joint. Remaining joint spaces are preserved. Mild soft tissue swelling superficial to the first [facility] joint. IMPRESSION: 1. No acute fracture or dislocation. 2. 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 Electronically Signed   By: Worthy Heads M.D.   On: 06/04/2023 00:31   DG Knee Complete 4 Views Left Result Date: 06/04/2023 CLINICAL DATA:  Left knee pain EXAM: LEFT KNEE - COMPLETE 4+ VIEW COMPARISON:  None Available. FINDINGS: Normal alignment. No acute fracture or dislocation. Mild bicompartmental degenerative arthritis within the patellofemoral and medial compartments. Mild prepatellar soft tissue swelling. Vascular calcifications noted. IMPRESSION: 1. Mild prepatellar soft tissue swelling. No acute fracture or dislocation. Electronically Signed   By: Worthy Heads M.D.   On: 06/04/2023 00:29   DG Knee Complete 4 Views Right Result Date: 06/04/2023 CLINICAL DATA:  Right knee pain EXAM: RIGHT KNEE - COMPLETE 4+ VIEW COMPARISON:  None Available. FINDINGS: No evidence of fracture, dislocation, or joint effusion. No evidence of arthropathy or other focal bone abnormality. Mild pretibial soft tissue swelling. Vascular calcifications. IMPRESSION: 1. Mild pretibial soft tissue swelling. No fracture or dislocation. Electronically Signed   By: Worthy Heads M.D.   On: 06/04/2023 00:28    Procedures Procedures    Medications Ordered in ED Medications - No data to display  ED Course/ Medical Decision Making/ A&P Clinical Course as of 06/04/23 0114  Fri Jun 04, 2023  0113 Patient with mechanical fall while at [facility].  She has been ambulating.  Imaging negative. No indication for head or C-spine imaging Will refer to orthopedics if no improvement.  Velcro wrist splint applied for comfort, no obvious snuffbox tenderness, low suspicion for scaphoid injury [DW]    Clinical Course User Index [DW] Eldon Greenland, MD           Glasgow Coma Scale Score: 15      NEXUS Criteria Score: 0                Medical Decision Making Amount and/or Complexity of Data Reviewed Radiology: ordered.   This patient presents to the ED for concern of fall, this involves an extensive number of treatment options, and is a complaint that carries with it a high risk of complications and morbidity.  The differential diagnosis includes but is not limited to head injury, spinal injury, blunt chest trauma, blunt abdominal trauma, extremity trauma  Comorbidities that complicate the patient evaluation: Patient's presentation is complicated by their history of hypertension  Social Determinants of Health: Patient's tobacco use  increases the complexity of managing their presentation  Imaging Studies ordered: I ordered imaging studies including X-ray right wrist and both knees  I independently visualized and interpreted imaging which showed no acute fracture I agree with the radiologist interpretation   Test Considered: I considered CT head and CT face, but patient has no signs of head or facial trauma, no LOC, no headache or vomiting   Reevaluation: After the interventions noted above, I reevaluated the patient and found that they have :improved  Complexity of problems addressed: Patient's presentation is most consistent with  acute complicated  illness/injury requiring diagnostic workup  Disposition: After consideration of the diagnostic results and the patient's response to treatment,  I feel that the patent would benefit from discharge  .          Final Clinical Impression(s) / ED Diagnoses Final diagnoses:  Sprain of right wrist, initial encounter  Sprain of right knee, unspecified ligament, initial encounter  Sprain of left knee, unspecified ligament, initial encounter    Rx / DC Orders ED Discharge Orders     None         Eldon Greenland, MD 06/04/23 [phone]

## 2023-06-08 ENCOUNTER — Ambulatory Visit (INDEPENDENT_AMBULATORY_CARE_PROVIDER_SITE_OTHER)

## 2023-06-08 DIAGNOSIS — M2041 Other hammer toe(s) (acquired), right foot: Secondary | ICD-10-CM | POA: Diagnosis not present

## 2023-06-08 DIAGNOSIS — M2141 Flat foot [pes planus] (acquired), right foot: Secondary | ICD-10-CM | POA: Diagnosis not present

## 2023-06-08 DIAGNOSIS — E1142 Type 2 diabetes mellitus with diabetic polyneuropathy: Secondary | ICD-10-CM | POA: Diagnosis not present

## 2023-06-08 DIAGNOSIS — M2142 Flat foot [pes planus] (acquired), left foot: Secondary | ICD-10-CM

## 2023-06-08 DIAGNOSIS — M2042 Other hammer toe(s) (acquired), left foot: Secondary | ICD-10-CM | POA: Diagnosis not present

## 2023-06-09 DIAGNOSIS — J301 Allergic rhinitis due to pollen: Secondary | ICD-10-CM | POA: Diagnosis not present

## 2023-06-09 DIAGNOSIS — J3089 Other allergic rhinitis: Secondary | ICD-10-CM | POA: Diagnosis not present

## 2023-06-11 ENCOUNTER — Encounter: Payer: Self-pay | Admitting: Student

## 2023-06-11 ENCOUNTER — Ambulatory Visit: Payer: Self-pay | Admitting: Student

## 2023-06-11 VITALS — BP 142/72 | HR 74 | Ht 65.0 in | Wt 153.4 lb

## 2023-06-11 DIAGNOSIS — L723 Sebaceous cyst: Secondary | ICD-10-CM | POA: Diagnosis not present

## 2023-06-11 DIAGNOSIS — I10 Essential (primary) hypertension: Secondary | ICD-10-CM | POA: Diagnosis not present

## 2023-06-11 DIAGNOSIS — M5442 Lumbago with sciatica, left side: Secondary | ICD-10-CM

## 2023-06-11 DIAGNOSIS — G8929 Other chronic pain: Secondary | ICD-10-CM | POA: Diagnosis not present

## 2023-06-11 DIAGNOSIS — M5441 Lumbago with sciatica, right side: Secondary | ICD-10-CM

## 2023-06-11 MED ORDER — TRAMADOL HCL 50 MG PO TABS: ORAL_TABLET | ORAL | 0 refills | Status: DC

## 2023-06-11 NOTE — Progress Notes (Signed)
    SUBJECTIVE:   CHIEF COMPLAINT / HPI:   [name] is a 73 year old female who presents for cyst removal and management of chronic back pain.  She presents for the removal of a cyst that has been present for many years, causing discomfort. She previously used black salve with some relief, but the cyst has recently started to protrude, prompting her to seek removal.  She experiences chronic back pain with imaging studies showing degenerative changes and mild scoliosis (images are from 2022 and can be found in media tab). She has limitations in lifting objects over ten pounds, and her pain worsens with carrying household items. She has not seen a chiropractor recently due to the retirement of her previous provider and financial constraints. She manages her pain with Tylenol  and tramadol , taken as needed, typically one to two times a day, with significant relief allowing her to perform daily tasks more comfortably. Her last dose was five days ago.  PERTINENT  PMH / PSH: Chronic low back pain, CKD 3a  OBJECTIVE:   BP (!) 142/72   Pulse 74   Ht 5\' 5"  (1.651 m)   Wt 153 lb 6.4 oz (69.6 kg)   SpO2 96%   BMI 25.53 kg/m    General: NAD, pleasant, able to participate in exam Cardiac: Well-perfused Respiratory: Breathing comfortably on room air Extremities: no edema or cyanosis. Skin: Sebaceous cyst of left shoulder, can see photos from last visit Neuro: alert, no obvious focal deficits Psych: Normal affect and mood  ASSESSMENT/PLAN:  Cyst Removal  Discussed risks/benefits of procedure, and patient gave consent for excision of cyst.  Prepped skin in usual sterile fashion.  Used 2cc of 1% lidocaine  with epinephrine  for local anesthesia, #11 blade to make a small incision over cyst, and scalpel and hemostat used to tease away cyst sac. Excised thick brown material and sac. Used 4.0 non absorbable suture to close wound with 1 simple suture.  Covered wound with band-aid.  EBL <0.5 cc.   Patient tolerated procedure well.  Discussed wound care and provided wound care handout, and advised f/u in 1 wk for suture removal.    Chronic low back pain Chronic degenerative changes of lumbar spine, can be seen on x-rays from 2022 that have been uploaded in media tab Cannot take NSAIDs due to CKD. Manages back pain with Tylenol  and tramadol  as needed which allows her to do daily activities. - PDMP reviewed with Dr. Drue Gerald, tramadol  refilled  Sebaceous cyst Cyst removed as in procedure note above.  She tolerated it well and was given handout on wound care. - Return in 7 to 10 days for suture removal  Essential hypertension Very mildly elevated today, asymptomatic, controlled at previous visit.  Possibly elevated in setting of for procedure appointment today - Recheck at next visit     Dr. Glenn Lange, DO [facility]

## 2023-06-11 NOTE — Assessment & Plan Note (Signed)
 Chronic degenerative changes of lumbar spine, can be seen on x-rays from 2022 that have been uploaded in media tab Cannot take NSAIDs due to CKD. Manages back pain with Tylenol  and tramadol  as needed which allows her to do daily activities. - PDMP reviewed with Dr. Drue Gerald, tramadol  refilled

## 2023-06-11 NOTE — Assessment & Plan Note (Signed)
 Cyst removed as in procedure note above.  She tolerated it well and was given handout on wound care. - Return in 7 to 10 days for suture removal

## 2023-06-11 NOTE — Assessment & Plan Note (Signed)
 Very mildly elevated today, asymptomatic, controlled at previous visit.  Possibly elevated in setting of for procedure appointment today - Recheck at next visit

## 2023-06-11 NOTE — Patient Instructions (Addendum)
 It was great to see you! Thank you for allowing me to participate in your care!  I recommend that you always bring your medications to each appointment as this makes it easy to ensure you are on the correct medications and helps us  not miss when refills are needed.  Our plans for today:  - Tramadol  sent to pharmacy for chronic pain - See attached information for wound care  - Return in 7 to 10 days for suture removal  - Return end of July/start of August for A1c and meet new PCP   Take care and seek immediate care sooner if you develop any concerns.   Dr. Glenn Lange, DO [facility]

## 2023-06-12 NOTE — Addendum Note (Signed)
 Addended by: Glenn Lange K on: 06/12/2023 10:30 PM   Modules accepted: Level of Service

## 2023-06-13 ENCOUNTER — Other Ambulatory Visit: Payer: Self-pay | Admitting: Student

## 2023-06-13 DIAGNOSIS — F411 Generalized anxiety disorder: Secondary | ICD-10-CM

## 2023-06-15 ENCOUNTER — Ambulatory Visit: Admitting: Student

## 2023-06-15 ENCOUNTER — Other Ambulatory Visit

## 2023-06-17 DIAGNOSIS — J301 Allergic rhinitis due to pollen: Secondary | ICD-10-CM | POA: Diagnosis not present

## 2023-06-17 DIAGNOSIS — J3089 Other allergic rhinitis: Secondary | ICD-10-CM | POA: Diagnosis not present

## 2023-06-18 ENCOUNTER — Ambulatory Visit: Admitting: Student

## 2023-06-18 VITALS — BP 140/78 | HR 78 | Ht 65.0 in | Wt 152.0 lb

## 2023-06-18 DIAGNOSIS — H6122 Impacted cerumen, left ear: Secondary | ICD-10-CM | POA: Insufficient documentation

## 2023-06-18 DIAGNOSIS — Z4802 Encounter for removal of sutures: Secondary | ICD-10-CM | POA: Insufficient documentation

## 2023-06-18 DIAGNOSIS — R143 Flatulence: Secondary | ICD-10-CM

## 2023-06-18 NOTE — Assessment & Plan Note (Signed)
 Has been going on for over a year, no other concerning symptoms and exam is benign.  Agreed with GI doctor that she can try Gas-X if desired.  Otherwise, can trial dietary changes to see if there are specific foods that cause increased flatus.  Nonconcerning from a medical standpoint at this time.

## 2023-06-18 NOTE — Patient Instructions (Addendum)
 It was great to see you! Thank you for allowing me to participate in your care!  I recommend that you always bring your medications to each appointment as this makes it easy to ensure you are on the correct medications and helps us  not miss when refills are needed.  Our plans for today:  - return near the end of next month for your next A1c  - Can try over the counter debrox ear wax softening drops in L ear if desired but do not have to  - If you notice any symptoms of injection of the left shoulder (redness, warmth, ordorous drainage, pain), return.      Take care and seek immediate care sooner if you develop any concerns.   Dr. Glenn Lange, DO [facility]

## 2023-06-18 NOTE — Assessment & Plan Note (Signed)
 Incision from cyst removal on posterior left shoulder seems to be healing well and is scabbed over.  1 suture removed without difficulty.  Drainage appears to be serosanguineous with no symptoms concerning for infection at this time.  Patient advised on return precautions including warmth, erythema, pain, purulent discharge.  Can wash with soap and water and apply Band-Aid for continued serosanguineous drainage if needed.

## 2023-06-18 NOTE — Progress Notes (Signed)
    SUBJECTIVE:   CHIEF COMPLAINT / HPI:   Patient returns 1 week after cyst removal to have sutures removed. Notes there has been drainage when she changes her Band-Aid which has been yellowish in color without an odor.  She denies any pain at the site but notes that it itches.  She also notes lots of flatus over the past year.  Denies any abdominal pain, nausea, blood in stool or other associated symptoms.  Is has discussed this with her GI doctor in the past who recommended trialing Gas-X which she does not like to take.  She does not consume a lot of dairy products.  Does eat a lot of salads.  She also requests a look in her ears to see if there is any earwax.  States her ears have been itching a little bit recently, does have history of seasonal allergies.  No issues with hearing or pain.  PERTINENT  PMH / PSH: HTN, T2DM  OBJECTIVE:   BP (!) 140/78   Pulse 78   Ht 5\' 5"  (1.651 m)   Wt 152 lb (68.9 kg)   SpO2 99%   BMI 25.29 kg/m    General: NAD, pleasant, able to participate in exam HEENT: White sclera, clear conjunctiva, right TM pearly gray with cone light present, nonbulging, left TM not visible due to cerumen Cardiac: RRR, no murmurs. Respiratory: CTAB, normal effort, No wheezes, rales or rhonchi Abdomen: Bowel sounds present, nontender, nondistended, soft Skin: Previous incision of the left posterior shoulder with suture in place, scabbed over.  There is surrounding color change from squeezing the lesion and possibly some hyperpigmentation but no warmth or swelling.  There is a small amount of yellowish/clear/bloody discharge when the area is squeezed.  No pain with palpation Neuro: alert, no obvious focal deficits Psych: Normal affect and mood   ASSESSMENT/PLAN:   Visit for suture removal Incision from cyst removal on posterior left shoulder seems to be healing well and is scabbed over.  1 suture removed without difficulty.  Drainage appears to be serosanguineous with  no symptoms concerning for infection at this time.  Patient advised on return precautions including warmth, erythema, pain, purulent discharge.  Can wash with soap and water and apply Band-Aid for continued serosanguineous drainage if needed.  Excessive flatus Has been going on for over a year, no other concerning symptoms and exam is benign.  Agreed with GI doctor that she can try Gas-X if desired.  Otherwise, can trial dietary changes to see if there are specific foods that cause increased flatus.  Nonconcerning from a medical standpoint at this time.  Cerumen debris on tympanic membrane of left ear Doubt this is the cause of her slightly itchy ears.  That is more likely related to allergies, she is already on Nasacort  and ipratropium nasal spray. - Can use over-the-counter Debrox earwax softening drops if desired for L ear   Return next month for A1c  Dr. Glenn Lange, DO  [facility]

## 2023-06-18 NOTE — Assessment & Plan Note (Signed)
 Doubt this is the cause of her slightly itchy ears.  That is more likely related to allergies, she is already on Nasacort  and ipratropium nasal spray. - Can use over-the-counter Debrox earwax softening drops if desired for L ear

## 2023-06-23 DIAGNOSIS — J301 Allergic rhinitis due to pollen: Secondary | ICD-10-CM | POA: Diagnosis not present

## 2023-06-23 DIAGNOSIS — J3089 Other allergic rhinitis: Secondary | ICD-10-CM | POA: Diagnosis not present

## 2023-07-01 DIAGNOSIS — J3089 Other allergic rhinitis: Secondary | ICD-10-CM | POA: Diagnosis not present

## 2023-07-01 DIAGNOSIS — J301 Allergic rhinitis due to pollen: Secondary | ICD-10-CM | POA: Diagnosis not present

## 2023-07-02 ENCOUNTER — Other Ambulatory Visit: Payer: Self-pay | Admitting: *Deleted

## 2023-07-02 DIAGNOSIS — E0840 Diabetes mellitus due to underlying condition with diabetic neuropathy, unspecified: Secondary | ICD-10-CM

## 2023-07-02 MED ORDER — GLIPIZIDE 10 MG PO TABS: ORAL_TABLET | ORAL | 0 refills | Status: AC

## 2023-07-06 ENCOUNTER — Ambulatory Visit

## 2023-07-06 DIAGNOSIS — M2041 Other hammer toe(s) (acquired), right foot: Secondary | ICD-10-CM

## 2023-07-06 DIAGNOSIS — M2042 Other hammer toe(s) (acquired), left foot: Secondary | ICD-10-CM

## 2023-07-06 DIAGNOSIS — M2141 Flat foot [pes planus] (acquired), right foot: Secondary | ICD-10-CM | POA: Diagnosis not present

## 2023-07-06 DIAGNOSIS — M2142 Flat foot [pes planus] (acquired), left foot: Secondary | ICD-10-CM

## 2023-07-06 DIAGNOSIS — E1142 Type 2 diabetes mellitus with diabetic polyneuropathy: Secondary | ICD-10-CM | POA: Diagnosis not present

## 2023-07-06 NOTE — Progress Notes (Signed)
 Pt was fit with replacement shoe very happy with sz

## 2023-07-07 DIAGNOSIS — J3089 Other allergic rhinitis: Secondary | ICD-10-CM | POA: Diagnosis not present

## 2023-07-07 DIAGNOSIS — J301 Allergic rhinitis due to pollen: Secondary | ICD-10-CM | POA: Diagnosis not present

## 2023-07-07 DIAGNOSIS — J3081 Allergic rhinitis due to animal (cat) (dog) hair and dander: Secondary | ICD-10-CM | POA: Diagnosis not present

## 2023-07-12 NOTE — Progress Notes (Signed)
 NA

## 2023-07-13 ENCOUNTER — Telehealth: Payer: Self-pay | Admitting: Student

## 2023-07-13 NOTE — Telephone Encounter (Signed)
 Patient was identified as falling into the [place] Measure - Diabetes.   Patient was: Appointment already scheduled for:  08/10/23.

## 2023-07-14 DIAGNOSIS — J3081 Allergic rhinitis due to animal (cat) (dog) hair and dander: Secondary | ICD-10-CM | POA: Diagnosis not present

## 2023-07-14 DIAGNOSIS — J301 Allergic rhinitis due to pollen: Secondary | ICD-10-CM | POA: Diagnosis not present

## 2023-07-14 DIAGNOSIS — J3089 Other allergic rhinitis: Secondary | ICD-10-CM | POA: Diagnosis not present

## 2023-07-21 DIAGNOSIS — J3089 Other allergic rhinitis: Secondary | ICD-10-CM | POA: Diagnosis not present

## 2023-07-21 DIAGNOSIS — J301 Allergic rhinitis due to pollen: Secondary | ICD-10-CM | POA: Diagnosis not present

## 2023-07-21 DIAGNOSIS — J3081 Allergic rhinitis due to animal (cat) (dog) hair and dander: Secondary | ICD-10-CM | POA: Diagnosis not present

## 2023-07-28 ENCOUNTER — Encounter: Payer: Self-pay | Admitting: Podiatry

## 2023-07-28 ENCOUNTER — Ambulatory Visit: Admitting: Podiatry

## 2023-07-28 DIAGNOSIS — M79674 Pain in right toe(s): Secondary | ICD-10-CM

## 2023-07-28 DIAGNOSIS — J301 Allergic rhinitis due to pollen: Secondary | ICD-10-CM | POA: Diagnosis not present

## 2023-07-28 DIAGNOSIS — M79675 Pain in left toe(s): Secondary | ICD-10-CM

## 2023-07-28 DIAGNOSIS — E1142 Type 2 diabetes mellitus with diabetic polyneuropathy: Secondary | ICD-10-CM

## 2023-07-28 DIAGNOSIS — B351 Tinea unguium: Secondary | ICD-10-CM

## 2023-07-28 DIAGNOSIS — J3089 Other allergic rhinitis: Secondary | ICD-10-CM | POA: Diagnosis not present

## 2023-08-02 ENCOUNTER — Other Ambulatory Visit: Payer: Self-pay

## 2023-08-02 DIAGNOSIS — E0840 Diabetes mellitus due to underlying condition with diabetic neuropathy, unspecified: Secondary | ICD-10-CM

## 2023-08-02 MED ORDER — ONETOUCH ULTRA VI STRP: ORAL_STRIP | 12 refills | Status: DC

## 2023-08-02 NOTE — Progress Notes (Signed)
 Subjective:  Patient ID: [name], female    DOB: 06/09/1950,  MRN: [id number]  [name] presents to clinic today for at risk foot care with history of diabetic neuropathy and painful, elongated thickened toenails x 10 which are symptomatic when wearing enclosed shoe gear. This interferes with his/her daily activities. Patient has brought her new diabetic shoes in and states they feel a little too tight. She would like to get a wider width. Chief Complaint  Patient presents with   dfc    Rm17 diabetic blood sugar 124/ Last visit May 2025 With Dr. Joshua   New problem(s): None.   PCP is Cleotilde Perkins, DO.  Allergies  Allergen Reactions   Alcohol Swabs [Isopropyl Alcohol] Nausea Only    Use peroxide instead   Penicillins Other (See Comments)    Arm swelling   Prednisone  Other (See Comments)    Could not sleep   Amoxicillin-Pot Clavulanate Other (See Comments)    She is unsure what reaction she had    Review of Systems: Negative except as noted in the HPI.  Objective: No changes noted in today's physical examination. There were no vitals filed for this visit. [name] is a pleasant 73 y.o. female WD, WN in NAD. AAO x 3.  Vascular Examination: Capillary refill time immediate b/l. Palpable pedal pulses. Pedal hair present b/l. No pain with calf compression b/l. Skin temperature gradient WNL b/l. No cyanosis or clubbing b/l. No ischemia or gangrene noted b/l. No edema noted b/l LE.  Neurological Examination: Sensation grossly intact b/l with 10 gram monofilament. Vibratory sensation intact b/l. Pt has subjective symptoms of neuropathy.  Dermatological Examination: Pedal skin with normal turgor, texture and tone b/l.  No open wounds. No interdigital macerations.   Toenails 1-5 b/l thick, discolored, elongated with subungual debris and pain on dorsal palpation.   No corns, calluses nor porokeratotic lesions noted.  Musculoskeletal Examination: Muscle  strength 5/5 to all lower extremity muscle groups bilaterally. Hammertoe(s) 2-5 b/l. Pes planus deformity noted bilateral LE.[name] No pain, crepitus or joint limitation noted with ROM b/l LE.  Patient ambulates independently without assistive aids.  Radiographs: None  Last A1c:      Latest Ref Rng & Units 05/11/2023   10:00 AM 02/02/2023    8:30 AM 11/10/2022    9:00 AM 08/06/2022    8:25 AM  Hemoglobin A1C  Hemoglobin-A1c 0.0 - 7.0 % 8.7  7.9  7.5  7.6    Assessment/Plan: 1. Pain due to onychomycosis of toenails of both feet   2. Diabetic peripheral neuropathy associated with type 2 diabetes mellitus [facility])    Consent given for treatment. Patient examined. All patient's and/or POA's questions/concerns addressed on today's visit. Toenails 1-5 debrided in length and girth without incident. Continue foot and shoe inspections daily. Monitor blood glucose per PCP/Endocrinologist's recommendations. Continue soft, supportive shoe gear daily. Report any pedal injuries to medical professional. Call office if there are any questions/concerns. -Patient to schedule appointment with Pedorthist for diabetic shoe reorder. -Patient/POA to call should there be question/concern in the interim.   Return in about 3 months (around 10/28/2023).  [name], DPM      [place] LOCATION: [address]  [place], [place] [address]                   Office [phone]   [facility] LOCATION: [address] [place], [place] [address] Office [phone]

## 2023-08-04 DIAGNOSIS — J3081 Allergic rhinitis due to animal (cat) (dog) hair and dander: Secondary | ICD-10-CM | POA: Diagnosis not present

## 2023-08-04 DIAGNOSIS — J3089 Other allergic rhinitis: Secondary | ICD-10-CM | POA: Diagnosis not present

## 2023-08-04 DIAGNOSIS — J301 Allergic rhinitis due to pollen: Secondary | ICD-10-CM | POA: Diagnosis not present

## 2023-08-10 ENCOUNTER — Encounter: Payer: Self-pay | Admitting: Student

## 2023-08-10 ENCOUNTER — Ambulatory Visit (INDEPENDENT_AMBULATORY_CARE_PROVIDER_SITE_OTHER): Admitting: Student

## 2023-08-10 VITALS — BP 130/84 | HR 78 | Ht 65.0 in | Wt 150.6 lb

## 2023-08-10 DIAGNOSIS — F17200 Nicotine dependence, unspecified, uncomplicated: Secondary | ICD-10-CM

## 2023-08-10 DIAGNOSIS — M5441 Lumbago with sciatica, right side: Secondary | ICD-10-CM | POA: Diagnosis not present

## 2023-08-10 DIAGNOSIS — R911 Solitary pulmonary nodule: Secondary | ICD-10-CM

## 2023-08-10 DIAGNOSIS — M5442 Lumbago with sciatica, left side: Secondary | ICD-10-CM

## 2023-08-10 DIAGNOSIS — E1140 Type 2 diabetes mellitus with diabetic neuropathy, unspecified: Secondary | ICD-10-CM | POA: Diagnosis not present

## 2023-08-10 DIAGNOSIS — G8929 Other chronic pain: Secondary | ICD-10-CM

## 2023-08-10 DIAGNOSIS — J3081 Allergic rhinitis due to animal (cat) (dog) hair and dander: Secondary | ICD-10-CM | POA: Diagnosis not present

## 2023-08-10 DIAGNOSIS — J301 Allergic rhinitis due to pollen: Secondary | ICD-10-CM | POA: Diagnosis not present

## 2023-08-10 DIAGNOSIS — J3089 Other allergic rhinitis: Secondary | ICD-10-CM | POA: Diagnosis not present

## 2023-08-10 LAB — POCT GLYCOSYLATED HEMOGLOBIN (HGB A1C): HbA1c, POC (controlled diabetic range): 7.5 % — AB (ref 0.0–7.0)

## 2023-08-10 MED ORDER — TRAMADOL HCL 50 MG PO TABS: ORAL_TABLET | ORAL | 0 refills | Status: DC

## 2023-08-10 NOTE — Assessment & Plan Note (Addendum)
 Smokes 1/2 ppd. Not interested in cessation at this time. She reports having a 10 lb weight loss in the last 6 months. No changes in appetite. No other B symptoms.   Lung nodule noted on CT chest on 01/2023 with 6 month follow up needed.  Will order CT chest today

## 2023-08-10 NOTE — Assessment & Plan Note (Addendum)
 A1c 7.5 Continue glipizide  and metformin   Discussed cutting out sugary drinks

## 2023-08-10 NOTE — Assessment & Plan Note (Signed)
 Refilled tramadol  30 tablets. Plan for 30 tablets every 30 days refill schedule  Will monitor kidney function every 3-6 months while on tramadol 

## 2023-08-10 NOTE — Patient Instructions (Signed)
 It was great to see you today!   I am ordering a CT scan of your chest to check on the lung nodule. You will get a call from our office within a week to get that scheduled.   Future Appointments  Date Time Provider Department Center  08/18/2023  9:00 AM [facility]  11/10/2023  9:00 AM Gaynel Delon CROME, DPM [facility]  05/29/2024  1:40 PM [facility] ANNUAL WELLNESS VISIT [facility]    Please arrive 15 minutes before your appointment to ensure smooth check in process.    Please call the clinic at [phone] if your symptoms worsen or you have any concerns.  Thank you for allowing me to participate in your care, Dr. Damien Pinal [facility]

## 2023-08-10 NOTE — Progress Notes (Signed)
    SUBJECTIVE:   CHIEF COMPLAINT / HPI:   [name] is a 73 y.o. female presenting for chronic pain follow up.   Chronic pain: located in neck and back. Takes tramadol  50 mg 1-2 times per day. She reports 30 pills will last her about 1 month.   She reports good compliance to her medications.   PERTINENT  PMH / PSH: reviewed and updated.  OBJECTIVE:   BP 130/84   Pulse 78   Ht 5' 5 (1.651 m)   Wt 150 lb 9.6 oz (68.3 kg)   SpO2 100%   BMI 25.06 kg/m   Well-appearing, no acute distress Cardio: Regular rate, regular rhythm, no murmurs on exam. Pulm: Clear, no wheezing, no crackles. No increased work of breathing Abdominal: bowel sounds present, soft, non-tender, non-distended Extremities: no peripheral edema  Neuro: alert and oriented x3, speech normal in content, no facial asymmetry, strength intact and equal bilaterally in UE and LE, pupils equal and reactive to light.  Psych:  Cognition and judgment appear intact. Alert, communicative  and cooperative with normal attention span and concentration. No apparent delusions, illusions, hallucinations    ASSESSMENT/PLAN:   Assessment & Plan Type 2 diabetes mellitus with diabetic neuropathy, without long-term current use of insulin (HCC) A1c 7.5 Continue glipizide  and metformin   Discussed cutting out sugary drinks  Chronic bilateral low back pain with sciatica, sciatica laterality unspecified Refilled tramadol  30 tablets. Plan for 30 tablets every 30 days refill schedule  Will monitor kidney function every 3-6 months while on tramadol   Lung nodule Tobacco use disorder Smokes 1/2 ppd. Not interested in cessation at this time. She reports having a 10 lb weight loss in the last 6 months. No changes in appetite. No other B symptoms.   Lung nodule noted on CT chest on 01/2023 with 6 month follow up needed.  Will order CT chest today      Damien Pinal, DO [facility]

## 2023-08-11 ENCOUNTER — Other Ambulatory Visit: Payer: Self-pay

## 2023-08-11 DIAGNOSIS — K219 Gastro-esophageal reflux disease without esophagitis: Secondary | ICD-10-CM

## 2023-08-11 MED ORDER — PANTOPRAZOLE SODIUM 40 MG PO TBEC: 80.0000 mg | DELAYED_RELEASE_TABLET | Freq: Every day | ORAL | 1 refills | Status: AC

## 2023-08-17 ENCOUNTER — Ambulatory Visit (HOSPITAL_COMMUNITY)

## 2023-08-18 ENCOUNTER — Ambulatory Visit

## 2023-08-18 DIAGNOSIS — J3081 Allergic rhinitis due to animal (cat) (dog) hair and dander: Secondary | ICD-10-CM | POA: Diagnosis not present

## 2023-08-18 DIAGNOSIS — J3089 Other allergic rhinitis: Secondary | ICD-10-CM | POA: Diagnosis not present

## 2023-08-18 DIAGNOSIS — J301 Allergic rhinitis due to pollen: Secondary | ICD-10-CM | POA: Diagnosis not present

## 2023-08-18 NOTE — Progress Notes (Signed)
 No charge sending shoes back and getting half SZ longer toes are close to the end and rubbing  Patient has return appt for 8/20 @ 10am to try on new shoes  Lolita Schultze Cped, CFo, CFm

## 2023-08-25 ENCOUNTER — Telehealth: Payer: Self-pay

## 2023-08-25 MED ORDER — CONTOUR TEST VI STRP: ORAL_STRIP | 12 refills | Status: DC

## 2023-08-25 MED ORDER — CONTOUR BLOOD GLUCOSE SYSTEM W/DEVICE KIT: PACK | 0 refills | Status: DC

## 2023-08-25 NOTE — Telephone Encounter (Signed)
 [facility] calls nurse line in regards to DM testing supplies.   She reports her insurance is no longer covering the One Touch supplies.   She reports she will need a new script for contour meter and strips.  I have pended these for you.

## 2023-08-26 DIAGNOSIS — J301 Allergic rhinitis due to pollen: Secondary | ICD-10-CM | POA: Diagnosis not present

## 2023-08-26 DIAGNOSIS — J3089 Other allergic rhinitis: Secondary | ICD-10-CM | POA: Diagnosis not present

## 2023-08-31 DIAGNOSIS — J3089 Other allergic rhinitis: Secondary | ICD-10-CM | POA: Diagnosis not present

## 2023-08-31 DIAGNOSIS — J301 Allergic rhinitis due to pollen: Secondary | ICD-10-CM | POA: Diagnosis not present

## 2023-09-01 ENCOUNTER — Ambulatory Visit

## 2023-09-01 NOTE — Progress Notes (Signed)
 Patient was here and took 11.5 shoe will try I told patient to let me know with in 66mo if not working  [name]

## 2023-09-09 DIAGNOSIS — J301 Allergic rhinitis due to pollen: Secondary | ICD-10-CM | POA: Diagnosis not present

## 2023-09-09 DIAGNOSIS — J3089 Other allergic rhinitis: Secondary | ICD-10-CM | POA: Diagnosis not present

## 2023-09-09 DIAGNOSIS — J3081 Allergic rhinitis due to animal (cat) (dog) hair and dander: Secondary | ICD-10-CM | POA: Diagnosis not present

## 2023-09-15 DIAGNOSIS — J301 Allergic rhinitis due to pollen: Secondary | ICD-10-CM | POA: Diagnosis not present

## 2023-09-15 DIAGNOSIS — J3081 Allergic rhinitis due to animal (cat) (dog) hair and dander: Secondary | ICD-10-CM | POA: Diagnosis not present

## 2023-09-15 DIAGNOSIS — J3089 Other allergic rhinitis: Secondary | ICD-10-CM | POA: Diagnosis not present

## 2023-09-27 ENCOUNTER — Other Ambulatory Visit: Payer: Self-pay | Admitting: Student

## 2023-09-28 ENCOUNTER — Other Ambulatory Visit (HOSPITAL_BASED_OUTPATIENT_CLINIC_OR_DEPARTMENT_OTHER): Payer: Self-pay

## 2023-09-28 ENCOUNTER — Ambulatory Visit

## 2023-09-28 MED ORDER — FLUZONE HIGH-DOSE 0.5 ML IM SUSY
0.5000 mL | PREFILLED_SYRINGE | Freq: Once | INTRAMUSCULAR | 0 refills | Status: AC
  Filled 2023-09-28: qty 0.5, 1d supply, fill #0

## 2023-09-28 MED ORDER — COMIRNATY 30 MCG/0.3ML IM SUSY
0.3000 mL | PREFILLED_SYRINGE | Freq: Once | INTRAMUSCULAR | 0 refills | Status: AC
Start: 2023-09-28 — End: 2023-09-29
  Filled 2023-09-28: qty 0.3, 1d supply, fill #0

## 2023-09-30 ENCOUNTER — Telehealth: Payer: Self-pay

## 2023-09-30 ENCOUNTER — Other Ambulatory Visit: Payer: Self-pay

## 2023-09-30 DIAGNOSIS — E0840 Diabetes mellitus due to underlying condition with diabetic neuropathy, unspecified: Secondary | ICD-10-CM

## 2023-09-30 MED ORDER — ACCU-CHEK SOFTCLIX LANCETS MISC: 5 refills | Status: AC

## 2023-09-30 MED ORDER — GLUCOSE BLOOD VI STRP: ORAL_STRIP | 12 refills | Status: DC

## 2023-09-30 NOTE — Telephone Encounter (Signed)
 Patient calls nurse line in regards to testing strips.   She reports she received the wrong ones. She reports she does not have a Contour Meter.   She reports she needs One Touch Ultra Blue.  Advised it appears the pharmacy sent us  the automatic request and apologized.  Will resend the correct testing strips.

## 2023-10-05 DIAGNOSIS — J3089 Other allergic rhinitis: Secondary | ICD-10-CM | POA: Diagnosis not present

## 2023-10-05 DIAGNOSIS — J301 Allergic rhinitis due to pollen: Secondary | ICD-10-CM | POA: Diagnosis not present

## 2023-10-05 DIAGNOSIS — J3081 Allergic rhinitis due to animal (cat) (dog) hair and dander: Secondary | ICD-10-CM | POA: Diagnosis not present

## 2023-10-08 ENCOUNTER — Ambulatory Visit

## 2023-10-12 DIAGNOSIS — J3089 Other allergic rhinitis: Secondary | ICD-10-CM | POA: Diagnosis not present

## 2023-10-12 DIAGNOSIS — J301 Allergic rhinitis due to pollen: Secondary | ICD-10-CM | POA: Diagnosis not present

## 2023-10-12 DIAGNOSIS — J3081 Allergic rhinitis due to animal (cat) (dog) hair and dander: Secondary | ICD-10-CM | POA: Diagnosis not present

## 2023-10-19 NOTE — Progress Notes (Signed)
 Shoes exchanged again this is good fit patient is very happy will let us  know if any problems arise

## 2023-10-19 NOTE — Progress Notes (Signed)
 Shoes re-fit and fit well patient will call if any problems arise

## 2023-10-20 DIAGNOSIS — J3089 Other allergic rhinitis: Secondary | ICD-10-CM | POA: Diagnosis not present

## 2023-10-20 DIAGNOSIS — J301 Allergic rhinitis due to pollen: Secondary | ICD-10-CM | POA: Diagnosis not present

## 2023-10-20 DIAGNOSIS — J3081 Allergic rhinitis due to animal (cat) (dog) hair and dander: Secondary | ICD-10-CM | POA: Diagnosis not present

## 2023-10-29 DIAGNOSIS — J3089 Other allergic rhinitis: Secondary | ICD-10-CM | POA: Diagnosis not present

## 2023-10-29 DIAGNOSIS — J3081 Allergic rhinitis due to animal (cat) (dog) hair and dander: Secondary | ICD-10-CM | POA: Diagnosis not present

## 2023-10-29 DIAGNOSIS — J301 Allergic rhinitis due to pollen: Secondary | ICD-10-CM | POA: Diagnosis not present

## 2023-11-03 DIAGNOSIS — J3089 Other allergic rhinitis: Secondary | ICD-10-CM | POA: Diagnosis not present

## 2023-11-03 DIAGNOSIS — J3081 Allergic rhinitis due to animal (cat) (dog) hair and dander: Secondary | ICD-10-CM | POA: Diagnosis not present

## 2023-11-03 DIAGNOSIS — J301 Allergic rhinitis due to pollen: Secondary | ICD-10-CM | POA: Diagnosis not present

## 2023-11-08 ENCOUNTER — Ambulatory Visit (HOSPITAL_COMMUNITY)
Admission: RE | Admit: 2023-11-08 | Discharge: 2023-11-08 | Disposition: A | Discharge status: Home / Self Care | Source: Ambulatory Visit | Attending: Family Medicine | Admitting: Family Medicine

## 2023-11-08 DIAGNOSIS — R911 Solitary pulmonary nodule: Secondary | ICD-10-CM | POA: Diagnosis not present

## 2023-11-08 DIAGNOSIS — R918 Other nonspecific abnormal finding of lung field: Secondary | ICD-10-CM | POA: Diagnosis not present

## 2023-11-08 DIAGNOSIS — J439 Emphysema, unspecified: Secondary | ICD-10-CM | POA: Diagnosis not present

## 2023-11-10 ENCOUNTER — Ambulatory Visit (INDEPENDENT_AMBULATORY_CARE_PROVIDER_SITE_OTHER): Admitting: Student

## 2023-11-10 ENCOUNTER — Ambulatory Visit: Payer: Self-pay | Admitting: Student

## 2023-11-10 ENCOUNTER — Encounter: Payer: Self-pay | Admitting: Podiatry

## 2023-11-10 ENCOUNTER — Ambulatory Visit: Admitting: Podiatry

## 2023-11-10 ENCOUNTER — Encounter: Payer: Self-pay | Admitting: Student

## 2023-11-10 VITALS — BP 138/78 | HR 75 | Ht 65.0 in | Wt 154.0 lb

## 2023-11-10 DIAGNOSIS — G8929 Other chronic pain: Secondary | ICD-10-CM | POA: Diagnosis not present

## 2023-11-10 DIAGNOSIS — B351 Tinea unguium: Secondary | ICD-10-CM

## 2023-11-10 DIAGNOSIS — J3089 Other allergic rhinitis: Secondary | ICD-10-CM | POA: Diagnosis not present

## 2023-11-10 DIAGNOSIS — I10 Essential (primary) hypertension: Secondary | ICD-10-CM | POA: Diagnosis not present

## 2023-11-10 DIAGNOSIS — M79674 Pain in right toe(s): Secondary | ICD-10-CM

## 2023-11-10 DIAGNOSIS — M79675 Pain in left toe(s): Secondary | ICD-10-CM

## 2023-11-10 DIAGNOSIS — M5441 Lumbago with sciatica, right side: Secondary | ICD-10-CM

## 2023-11-10 DIAGNOSIS — J301 Allergic rhinitis due to pollen: Secondary | ICD-10-CM | POA: Diagnosis not present

## 2023-11-10 DIAGNOSIS — M5442 Lumbago with sciatica, left side: Secondary | ICD-10-CM

## 2023-11-10 DIAGNOSIS — E1140 Type 2 diabetes mellitus with diabetic neuropathy, unspecified: Secondary | ICD-10-CM | POA: Diagnosis not present

## 2023-11-10 DIAGNOSIS — J3081 Allergic rhinitis due to animal (cat) (dog) hair and dander: Secondary | ICD-10-CM | POA: Diagnosis not present

## 2023-11-10 DIAGNOSIS — E1169 Type 2 diabetes mellitus with other specified complication: Secondary | ICD-10-CM | POA: Diagnosis not present

## 2023-11-10 DIAGNOSIS — E1142 Type 2 diabetes mellitus with diabetic polyneuropathy: Secondary | ICD-10-CM

## 2023-11-10 DIAGNOSIS — E785 Hyperlipidemia, unspecified: Secondary | ICD-10-CM

## 2023-11-10 LAB — POCT GLYCOSYLATED HEMOGLOBIN (HGB A1C): HbA1c, POC (controlled diabetic range): 7 % (ref 0.0–7.0)

## 2023-11-10 MED ORDER — TRAMADOL HCL 50 MG PO TABS: ORAL_TABLET | ORAL | 0 refills | Status: AC

## 2023-11-10 MED ORDER — GLUCOSE BLOOD VI STRP: ORAL_STRIP | 12 refills | Status: DC

## 2023-11-10 NOTE — Telephone Encounter (Signed)
 Results to be discussed with patient at appointment

## 2023-11-10 NOTE — Patient Instructions (Signed)
 It was great to see you today!   Continue taking all your medications as prescribed - no changes today   Your lung screening looked great. We will recheck in 1 month   Future Appointments  Date Time Provider Department Center  02/22/2024  2:15 PM Gaynel Delon CROME, DPM [facility]  05/29/2024  1:40 PM [facility] ANNUAL WELLNESS VISIT [facility]    Please arrive 15 minutes before your appointment to ensure smooth check in process.  If you are more than 15 minutes late, you may be asked to reschedule.   Please bring a list of your medications with you to all appointments.   Please call the clinic at [phone] if your symptoms worsen or you have any concerns.  Thank you for allowing me to participate in your care, Dr. Damien Pinal [facility]

## 2023-11-10 NOTE — Progress Notes (Unsigned)
    SUBJECTIVE:   CHIEF COMPLAINT / HPI:   [name] is a 73 y.o. female presenting for follow up of chronic conditions.   Glycemic control - Recent hemoglobin A1c is 7.0, indicating improved glycemic control - Takes metformin  1000 mg twice daily and glipizide  10 mg daily - Unable to check blood glucose at home due to insurance issues with test strips  Chronic pulmonary disease - Mild lung disease with recent screening showing no nodules or malignancy - Imaging demonstrates COPD-like changes - Continues to smoke daily  Chronic pain management - Uses tramadol  for pain, typically twice daily as needed - 30 tablets of tramadol  lasted three months on this occasion, but usually lasts one month - Pain levels increase with weather changes  Hyperlipidemia - Takes atorvastatin  for cholesterol management  PERTINENT  PMH / PSH: reviewed and updated.  OBJECTIVE:   BP 138/78   Pulse 75   Ht 5' 5 (1.651 m)   Wt 154 lb (69.9 kg)   SpO2 100%   BMI 25.63 kg/m   Well-appearing, no acute distress Cardio: Regular rate, regular rhythm, no murmurs on exam. Pulm: Clear, no wheezing, no crackles. No increased work of breathing Abdominal: bowel sounds present, soft, non-tender, non-distended Extremities: no peripheral edema   ASSESSMENT/PLAN:   Assessment & Plan Type 2 diabetes mellitus with diabetic neuropathy, without long-term current use of insulin (HCC) A1c 7.0 today  Continue medications as above  Refilled test strips  Recheck BMP today to monitor kidney function Chronic bilateral low back pain with sciatica, sciatica laterality unspecified Discussed chronic pain medication Agreed to 30 tablets every 30 days, no early refills  Hyperlipidemia associated with type 2 diabetes mellitus (HCC) Recheck lipid panel  Goal LDL <70 Essential hypertension Blood pressure still above goal <130 Recheck at next visit  Consider medication adjustments then      Damien Pinal,  DO [facility]

## 2023-11-11 ENCOUNTER — Ambulatory Visit: Payer: Self-pay | Admitting: Student

## 2023-11-11 LAB — LIPID PANEL
Chol/HDL Ratio: 2.5 ratio (ref 0.0–4.4)
Cholesterol, Total: 101 mg/dL (ref 100–199)
HDL: 40 mg/dL (ref >39)
LDL Chol Calc (NIH): 44 mg/dL (ref 0–99)
Triglycerides: 84 mg/dL (ref 0–149)
VLDL Cholesterol Cal: 17 mg/dL (ref 5–40)

## 2023-11-11 LAB — BASIC METABOLIC PANEL WITH GFR
BUN/Creatinine Ratio: 13 (ref 12–28)
BUN: 15 mg/dL (ref 8–27)
CO2: 23 mmol/L (ref 20–29)
Calcium: 9.6 mg/dL (ref 8.7–10.3)
Chloride: 101 mmol/L (ref 96–106)
Creatinine, Ser: 1.16 mg/dL — ABNORMAL HIGH (ref 0.57–1.00)
Glucose: 116 mg/dL — ABNORMAL HIGH (ref 70–99)
Potassium: 4 mmol/L (ref 3.5–5.2)
Sodium: 139 mmol/L (ref 134–144)
eGFR: 50 mL/min/1.73 — ABNORMAL LOW (ref >59)

## 2023-11-11 NOTE — Assessment & Plan Note (Addendum)
 Blood pressure still above goal <130 Recheck at next visit  Consider medication adjustments then

## 2023-11-11 NOTE — Assessment & Plan Note (Signed)
 A1c 7.0 today  Continue medications as above  Refilled test strips  Recheck BMP today to monitor kidney function

## 2023-11-11 NOTE — Assessment & Plan Note (Signed)
 Discussed chronic pain medication Agreed to 30 tablets every 30 days, no early refills

## 2023-11-14 ENCOUNTER — Encounter: Payer: Self-pay | Admitting: Podiatry

## 2023-11-14 NOTE — Progress Notes (Signed)
 Subjective:  Patient ID: [name], female    DOB: April 25, 1950,  MRN: [id number]  [name] presents to clinic today for at risk foot care with history of diabetic neuropathy and painful mycotic toenails of both feet that are difficult to trim. Pain interferes with daily activities and wearing enclosed shoe gear comfortably.  Chief Complaint  Patient presents with   Diabetes    [facility]FC A1C 7.5 Toenail trim. LOV with PCP 08/10/23.   New problem(s): None.   PCP is Cleotilde Perkins, DO.  Allergies  Allergen Reactions   Alcohol Swabs [Isopropyl Alcohol] Nausea Only    Use peroxide instead   Penicillins Other (See Comments)    Arm swelling   Prednisone  Other (See Comments)    Could not sleep   Amoxicillin-Pot Clavulanate Other (See Comments)    She is unsure what reaction she had    Review of Systems: Negative except as noted in the HPI.  Objective: No changes noted in today's physical examination. There were no vitals filed for this visit. [name] is a pleasant 73 y.o. female WD, WN in NAD. AAO x 3.  Vascular Examination: Capillary refill time immediate b/l. Palpable pedal pulses. Pedal hair present b/l. Pedal edema absent. No pain with calf compression b/l. Skin temperature gradient WNL b/l. No cyanosis or clubbing b/l. No ischemia or gangrene noted b/l.   Neurological Examination: Sensation grossly intact b/l with 10 gram monofilament. Vibratory sensation intact b/l. Pt has subjective symptoms of neuropathy.  Dermatological Examination: Pedal skin with normal turgor, texture and tone b/l.  No open wounds. No interdigital macerations.   Toenails 1-5 b/l thick, discolored, elongated with subungual debris and pain on dorsal palpation.   No hyperkeratotic nor porokeratotic lesions.  Musculoskeletal Examination: Muscle strength 5/5 to all lower extremity muscle groups bilaterally. Hammertoe deformity noted 2-5 b/l. Pes planus deformity noted bilateral LE.[name] No  pain, crepitus or joint limitation noted with ROM b/l LE.  Patient ambulates independently without assistive aids.  Radiographs: None  Last A1c:      Latest Ref Rng & Units 11/10/2023   11:21 AM 08/10/2023    1:25 PM 05/11/2023   10:00 AM 02/02/2023    8:30 AM  Hemoglobin A1C  Hemoglobin-A1c 0.0 - 7.0 % 7.0  7.5  8.7  7.9     Assessment/Plan: 1. Pain due to onychomycosis of toenails of both feet   2. Diabetic peripheral neuropathy associated with type 2 diabetes mellitus (HCC)   Patient was evaluated and treated. All patient's and/or POA's questions/concerns addressed on today's visit. Mycotic toenails 1-5 b/l debrided in length and girth without incident.  Continue daily foot inspections and monitor blood glucose per PCP/Endocrinologist's recommendations.Continue soft, supportive shoe gear daily. Report any pedal injuries to medical professional. Call office if there are any quesitons/concerns. -Patient/POA to call should there be question/concern in the interim.   Return in about 3 months (around 02/10/2024).  Delon LITTIE Merlin, DPM      [place] LOCATION: [address] [place] [address]                   Office [phone]   [facility] LOCATION: [address] [place], [place] [address]  Office [phone]

## 2023-11-22 ENCOUNTER — Telehealth: Payer: Self-pay

## 2023-11-22 MED ORDER — ONETOUCH ULTRA BLUE TEST VI STRP: ORAL_STRIP | 12 refills | Status: DC

## 2023-11-22 NOTE — Telephone Encounter (Signed)
 Patient calls nurse line in regards to testing strips.   She reports pharmacy never received the one touch ultra blue test strips on 10/29.  I have resent testing strips to the pharmacy.   Patient was appreciative.

## 2023-11-24 ENCOUNTER — Other Ambulatory Visit: Payer: Self-pay | Admitting: Student

## 2023-11-25 ENCOUNTER — Telehealth: Payer: Self-pay

## 2023-11-25 MED ORDER — ACCU-CHEK GUIDE W/DEVICE KIT: PACK | 0 refills | Status: AC

## 2023-11-25 MED ORDER — ACCU-CHEK GUIDE TEST VI STRP: ORAL_STRIP | 12 refills | Status: AC

## 2023-11-25 NOTE — Telephone Encounter (Signed)
 Pharmacy reports the patients insurance is no longer covering the one touch DM supplies.   They report her insurance is now covering Accu Chek Guide.   They need new meter and testing strips.   I have sent these to her pharmacy.

## 2023-12-14 LAB — OPHTHALMOLOGY REPORT-SCANNED

## 2023-12-15 ENCOUNTER — Other Ambulatory Visit: Payer: Self-pay

## 2023-12-15 DIAGNOSIS — I10 Essential (primary) hypertension: Secondary | ICD-10-CM

## 2023-12-16 ENCOUNTER — Other Ambulatory Visit: Payer: Self-pay

## 2023-12-16 DIAGNOSIS — I10 Essential (primary) hypertension: Secondary | ICD-10-CM

## 2023-12-16 MED ORDER — ATORVASTATIN CALCIUM 40 MG PO TABS: 40.0000 mg | ORAL_TABLET | Freq: Every day | ORAL | 3 refills | Status: AC

## 2024-01-11 NOTE — Progress Notes (Unsigned)
" °  Electrophysiology Office Note:   Date:  01/12/2024  ID:  [name], [name] 02/09/1950, MRN [id number]  Primary Cardiologist: None Primary Heart Failure: None Electrophysiologist: None      History of Present Illness:   [name] is a 73 y.o. female with h/o hypertension, SVT, tobacco use, hyperlipidemia, diabetes seen today for routine electrophysiology followup.   Discussed the use of AI scribe software for clinical note transcription with the patient, who gave verbal consent to proceed.  History of Present Illness [name] is a 73 year old female with arrhythmias who presents for follow-up regarding her heart condition.  She feels good overall but notes that her blood pressure is elevated today, attributing it to the stress of seeing a new doctor. Her blood pressure has been well controlled in the past, with good readings in October and July.  She experiences 'glitches' around her heart, which were previously addressed. She is currently on a small dose of metoprolol , which she continues. She is reluctant to take additional medication due to fear.  Her husband has passed away, and she lives alone, which impacts her decisions regarding potential surgical interventions.  she denies chest pain, palpitations, dyspnea, PND, orthopnea, nausea, vomiting, dizziness, syncope, edema, weight gain, or early satiety.   Review of systems complete and found to be negative unless listed in HPI.   EP Information / Studies Reviewed:    EKG is ordered today. Personal review as below.  EKG Interpretation Date/Time:  Wednesday January 12 2024 12:25:28 EST Ventricular Rate:  66 PR Interval:  152 QRS Duration:  86 QT Interval:  446 QTC Calculation: 467 R Axis:   -7  Text Interpretation: Normal sinus rhythm Left ventricular hypertrophy with repolarization abnormality ( R in aVL ) When compared with ECG of 14-Dec-2022 11:08, No significant change was found Confirmed by [employer],  Carsyn Taubman ([id number]) on 01/12/2024 12:29:30 PM     Risk Assessment/Calculations:            Physical Exam:   VS:  BP (!) 186/92 (BP Location: Left Arm, Patient Position: Sitting, Cuff Size: Normal)   Pulse 66   Ht 5' 5 (1.651 m)   Wt 152 lb (68.9 kg)   BMI 25.29 kg/m    Wt Readings from Last 3 Encounters:  01/12/24 152 lb (68.9 kg)  11/10/23 154 lb (69.9 kg)  08/10/23 150 lb 9.6 oz (68.3 kg)     GEN: Well nourished, well developed in no acute distress NECK: No JVD; No carotid bruits CARDIAC: Regular rate and rhythm, no murmurs, rubs, gallops RESPIRATORY:  Clear to auscultation without rales, wheezing or rhonchi  ABDOMEN: Soft, non-tender, non-distended EXTREMITIES:  No edema; No deformity   ASSESSMENT AND PLAN:    1.  SVT: Normal episodes.  Currently well-controlled on metoprolol .  Meliton Samad continue with current management.  2.  Hypertension: Blood pressure is elevated today.  She feels that this is due to stress.  Jireh Elmore not make any further changes at this time.  She Jewelene Mairena check her blood pressure at home and discuss with her primary physician.  Follow up with EP Team in 12 months  Signed, Ardis Lawley Gladis Norton, MD  "

## 2024-01-12 ENCOUNTER — Encounter: Payer: Self-pay | Admitting: Cardiology

## 2024-01-12 ENCOUNTER — Ambulatory Visit: Admitting: Cardiology

## 2024-01-12 VITALS — BP 186/92 | HR 66 | Ht 65.0 in | Wt 152.0 lb

## 2024-01-12 DIAGNOSIS — I471 Supraventricular tachycardia, unspecified: Secondary | ICD-10-CM

## 2024-01-12 DIAGNOSIS — I10 Essential (primary) hypertension: Secondary | ICD-10-CM | POA: Diagnosis not present

## 2024-01-12 NOTE — Patient Instructions (Signed)
 Medication Instructions:  Your physician recommends that you continue on your current medications as directed. Please refer to the Current Medication list given to you today.  *If you need a refill on your cardiac medications before your next appointment, please call your pharmacy*  Lab Work: None ordered.  If you have labs (blood work) drawn today and your tests are completely normal, you will receive your results only by: MyChart Message (if you have MyChart) OR A paper copy in the mail If you have any lab test that is abnormal or we need to change your treatment, we will call you to review the results.  Testing/Procedures: None ordered.   Follow-Up: At [facility], you and your health needs are our priority.  As part of our continuing mission to provide you with exceptional heart care, our providers are all part of one team.  This team includes your primary Cardiologist (physician) and Advanced Practice Providers or APPs (Physician Assistants and Nurse Practitioners) who all work together to provide you with the care you need, when you need it.  Your next appointment:   12 months with Charlies Arthur, PA-C

## 2024-01-21 LAB — HM MAMMOGRAPHY

## 2024-01-31 ENCOUNTER — Encounter: Payer: Self-pay | Admitting: Pharmacist

## 2024-01-31 NOTE — Progress Notes (Signed)
 This patient is appearing on a report for being at risk of failing the adherence measure for diabetes medications this calendar year.   Medication: metformin  Last fill date: 11/25/23 for 90 day supply  Reviewed medication indication, dosing, and goals of therapy.

## 2024-02-08 ENCOUNTER — Ambulatory Visit: Admitting: Student

## 2024-02-09 ENCOUNTER — Ambulatory Visit: Admitting: Student

## 2024-02-14 ENCOUNTER — Ambulatory Visit: Admitting: Student

## 2024-02-22 ENCOUNTER — Ambulatory Visit: Admitting: Podiatry

## 2024-02-23 ENCOUNTER — Ambulatory Visit: Payer: Self-pay | Admitting: Student

## 2024-05-29 ENCOUNTER — Encounter
